# Patient Record
Sex: Male | Born: 1950 | Race: White | Hispanic: No | Marital: Married | Smoking: Current every day smoker
Health system: Southern US, Community
[De-identification: ages and names within clinical notes are randomized; demographics above are authoritative.]

## PROBLEM LIST (undated history)

## (undated) DIAGNOSIS — I679 Cerebrovascular disease, unspecified: Secondary | ICD-10-CM

## (undated) DIAGNOSIS — E119 Type 2 diabetes mellitus without complications: Secondary | ICD-10-CM

## (undated) DIAGNOSIS — K589 Irritable bowel syndrome without diarrhea: Secondary | ICD-10-CM

## (undated) DIAGNOSIS — I4819 Other persistent atrial fibrillation: Secondary | ICD-10-CM

## (undated) DIAGNOSIS — M1990 Unspecified osteoarthritis, unspecified site: Secondary | ICD-10-CM

## (undated) DIAGNOSIS — Z9889 Other specified postprocedural states: Secondary | ICD-10-CM

## (undated) DIAGNOSIS — M48061 Spinal stenosis, lumbar region without neurogenic claudication: Secondary | ICD-10-CM

## (undated) DIAGNOSIS — I2510 Atherosclerotic heart disease of native coronary artery without angina pectoris: Secondary | ICD-10-CM

## (undated) DIAGNOSIS — E785 Hyperlipidemia, unspecified: Secondary | ICD-10-CM

## (undated) DIAGNOSIS — Z720 Tobacco use: Secondary | ICD-10-CM

## (undated) DIAGNOSIS — I739 Peripheral vascular disease, unspecified: Secondary | ICD-10-CM

## (undated) DIAGNOSIS — R7303 Prediabetes: Secondary | ICD-10-CM

## (undated) DIAGNOSIS — I252 Old myocardial infarction: Secondary | ICD-10-CM

## (undated) DIAGNOSIS — I10 Essential (primary) hypertension: Secondary | ICD-10-CM

## (undated) DIAGNOSIS — Z87442 Personal history of urinary calculi: Secondary | ICD-10-CM

## (undated) DIAGNOSIS — I499 Cardiac arrhythmia, unspecified: Secondary | ICD-10-CM

## (undated) DIAGNOSIS — R112 Nausea with vomiting, unspecified: Secondary | ICD-10-CM

## (undated) DIAGNOSIS — I639 Cerebral infarction, unspecified: Secondary | ICD-10-CM

## (undated) HISTORY — PX: CHOLECYSTECTOMY: SHX55

## (undated) HISTORY — PX: CARDIAC CATHETERIZATION: SHX172

## (undated) HISTORY — PX: TONSILLECTOMY: SUR1361

## (undated) HISTORY — DX: Irritable bowel syndrome, unspecified: K58.9

## (undated) HISTORY — DX: Tobacco use: Z72.0

## (undated) HISTORY — PX: ANTERIOR CRUCIATE LIGAMENT REPAIR: SHX115

## (undated) HISTORY — DX: Old myocardial infarction: I25.2

## (undated) HISTORY — DX: Cerebral infarction, unspecified: I63.9

## (undated) HISTORY — PX: KNEE SURGERY: SHX244

## (undated) HISTORY — DX: Hyperlipidemia, unspecified: E78.5

## (undated) HISTORY — DX: Other persistent atrial fibrillation: I48.19

## (undated) HISTORY — DX: Essential (primary) hypertension: I10

## (undated) HISTORY — DX: Atherosclerotic heart disease of native coronary artery without angina pectoris: I25.10

## (undated) HISTORY — DX: Cerebrovascular disease, unspecified: I67.9

## (undated) HISTORY — DX: Peripheral vascular disease, unspecified: I73.9

## (undated) MED FILL — Dexamethasone Sodium Phosphate Inj 100 MG/10ML: INTRAMUSCULAR | Qty: 1 | Status: AC

## (undated) MED FILL — Fosaprepitant Dimeglumine For IV Infusion 150 MG (Base Eq): INTRAVENOUS | Qty: 5 | Status: AC

---

## 1994-09-24 DIAGNOSIS — I252 Old myocardial infarction: Secondary | ICD-10-CM

## 1994-09-24 HISTORY — DX: Old myocardial infarction: I25.2

## 1999-03-01 ENCOUNTER — Ambulatory Visit (HOSPITAL_COMMUNITY): Admission: RE | Admit: 1999-03-01 | Discharge: 1999-03-01 | Payer: Self-pay | Admitting: Family Medicine

## 1999-03-01 ENCOUNTER — Encounter: Payer: Self-pay | Admitting: Family Medicine

## 2000-01-24 ENCOUNTER — Ambulatory Visit (HOSPITAL_COMMUNITY): Admission: RE | Admit: 2000-01-24 | Discharge: 2000-01-24 | Payer: Self-pay | Admitting: Cardiovascular Disease

## 2002-05-20 ENCOUNTER — Encounter: Payer: Self-pay | Admitting: Otolaryngology

## 2002-05-20 ENCOUNTER — Encounter: Admission: RE | Admit: 2002-05-20 | Discharge: 2002-05-20 | Payer: Self-pay | Admitting: Otolaryngology

## 2004-02-23 ENCOUNTER — Ambulatory Visit (HOSPITAL_COMMUNITY): Admission: RE | Admit: 2004-02-23 | Discharge: 2004-02-23 | Payer: Self-pay | Admitting: Family Medicine

## 2006-07-24 ENCOUNTER — Inpatient Hospital Stay (HOSPITAL_BASED_OUTPATIENT_CLINIC_OR_DEPARTMENT_OTHER): Admission: RE | Admit: 2006-07-24 | Discharge: 2006-07-24 | Payer: Self-pay | Admitting: Cardiovascular Disease

## 2006-07-26 ENCOUNTER — Ambulatory Visit (HOSPITAL_COMMUNITY): Admission: RE | Admit: 2006-07-26 | Discharge: 2006-07-27 | Payer: Self-pay | Admitting: Cardiovascular Disease

## 2006-08-25 ENCOUNTER — Encounter: Payer: Self-pay | Admitting: Cardiovascular Disease

## 2007-03-05 ENCOUNTER — Emergency Department (HOSPITAL_COMMUNITY): Admission: EM | Admit: 2007-03-05 | Discharge: 2007-03-05 | Payer: Self-pay | Admitting: Emergency Medicine

## 2010-08-11 ENCOUNTER — Encounter: Admission: RE | Admit: 2010-08-11 | Discharge: 2010-08-11 | Payer: Self-pay | Admitting: Cardiovascular Disease

## 2010-08-11 ENCOUNTER — Encounter: Payer: Self-pay | Admitting: Cardiovascular Disease

## 2010-08-11 ENCOUNTER — Ambulatory Visit: Payer: Self-pay | Admitting: Cardiology

## 2010-08-14 ENCOUNTER — Telehealth (INDEPENDENT_AMBULATORY_CARE_PROVIDER_SITE_OTHER): Payer: Self-pay | Admitting: Radiology

## 2010-08-15 ENCOUNTER — Encounter (HOSPITAL_COMMUNITY)
Admission: RE | Admit: 2010-08-15 | Discharge: 2010-10-24 | Payer: Self-pay | Source: Home / Self Care | Attending: Cardiovascular Disease | Admitting: Cardiovascular Disease

## 2010-08-15 ENCOUNTER — Ambulatory Visit: Payer: Self-pay

## 2010-08-15 ENCOUNTER — Encounter: Payer: Self-pay | Admitting: Cardiology

## 2010-08-15 ENCOUNTER — Ambulatory Visit: Payer: Self-pay | Admitting: Cardiology

## 2010-08-15 ENCOUNTER — Ambulatory Visit: Payer: Self-pay | Admitting: Cardiovascular Disease

## 2010-08-15 DIAGNOSIS — I2510 Atherosclerotic heart disease of native coronary artery without angina pectoris: Secondary | ICD-10-CM

## 2010-09-01 ENCOUNTER — Ambulatory Visit: Payer: Self-pay | Admitting: Surgery

## 2010-10-24 NOTE — Assessment & Plan Note (Signed)
Summary: ADD ON ABN MYOVIEW   Visit Type:  DOD ADD-ON Primary Provider:  Leonides Sake  CC:  pt added on to schedule today from nuc study...sob while on treadmill during stress test...pt smokes about 1 1/4 ppd....denies any cp during stress stest.  History of Present Illness: 60 yo WM with history of CAD s/p DES RCA, HTN and hyperlipidemia who is added on to my schedule today as the DOD after positive stress test for evaluation.  He was seen by Norma Fredrickson in the [facility] office last week for complaints of fatigue  and facial flushing. He has had recent elevated blood pressures at home. He has been under much stress at home. A stress myoview was ordered and was performed today in our office. His stress test is read as low risk. His perfusion images showed mild  apical thinning. With exercise, he developed ST segment depression. He tells me that he has had no chest pain or significant change in his breathing. He felt that he did well on the treadmill today. His BP went up to 230 systolically with exercise today.  Blood pressures at home have been 180/100. No chest pain similar to the pain he was having prior to his stent being placed in 2007.   Current Medications (verified): 1)  Lisinopril-Hydrochlorothiazide 20-25 Mg Tabs (Lisinopril-Hydrochlorothiazide) .Marland Kitchen.. 1 Tab Once Daily 2)  Crestor 10 Mg Tabs (Rosuvastatin Calcium) .Marland Kitchen.. 1 Tab At Bedtime 3)  Plavix 75 Mg Tabs (Clopidogrel Bisulfate) .Marland Kitchen.. 1 Tab Once Daily 4)  Tricor 145 Mg Tabs (Fenofibrate) .Marland Kitchen.. 1 Tab Once Daily 5)  Meloxicam 7.5 Mg Tabs (Meloxicam) .Marland Kitchen.. 1 Tab Once Daily As Needed 6)  Vicodin 5-500 Mg Tabs (Hydrocodone-Acetaminophen) .Marland Kitchen.. 1 Tab Once Daily As Needed 7)  Nitrostat 0.4 Mg Subl (Nitroglycerin) .Marland Kitchen.. 1 Tablet Under Tongue At Onset of Chest Pain; You May Repeat Every 5 Minutes For Up To 3 Doses.  Allergies (verified): No Known Drug Allergies  Past History:  Past Medical History: CAD s/p DES RCA 11/07, PTCA 1996  Circumflex Hyperlipidemia HTN Tobacco abuse  Past Surgical History: ACL repair left knee 4 surgeries right knee Cholecystectomy  Family History: Mother-deceased, Alzheimers, age 59 Father-deceased, late 45s. CAD s/p CABG  Social History: Tobacco-1ppd for 40 years Social alcohol No illicit drug use Married, 2 children (25,21)  Review of Systems       The patient complains of fatigue.  The patient denies malaise, fever, weight gain/loss, vision loss, decreased hearing, hoarseness, chest pain, palpitations, shortness of breath, prolonged cough, wheezing, sleep apnea, coughing up blood, abdominal pain, blood in stool, nausea, vomiting, diarrhea, heartburn, incontinence, blood in urine, muscle weakness, joint pain, leg swelling, rash, skin lesions, headache, fainting, dizziness, depression, anxiety, enlarged lymph nodes, easy bruising or bleeding, and environmental allergies.    Vital Signs:  Patient profile:   60 year old male Height:      70 inches Weight:      196 pounds BMI:     28.22 Pulse rate:   56 / minute Pulse rhythm:   irregular BP sitting:   152 / 67  (left arm) Cuff size:   large  Vitals Entered By: Danielle Rankin, CMA (August 15, 2010 4:11 PM)  Physical Exam  General:  General: Well developed, well nourished, NAD HEENT: OP clear, mucus membranes moist SKIN: warm, dry Neuro: No focal deficits Musculoskeletal: Muscle strength 5/5 all ext Psychiatric: Mood and affect normal Neck: No JVD, no carotid bruits, no thyromegaly, no lymphadenopathy. Lungs:Clear bilaterally, no wheezes,  rhonci, crackles CV: RRR no murmurs, gallops rubs Abdomen: soft, NT, ND, BS present Extremities: No edema, pulses 2+.    Impression & Recommendations:  Problem # 1:  CAD, NATIVE VESSEL (ICD-414.01) Low risk stress test today. No exertional chest pain. He continues to smoke. I have asked him to stop smoking. I will add Norvasc 5 mg by mouth Qdaily for better BP control. He will  follow up with Dr. Elease Hashimoto in 2-3 weeks. I do not think a cath is indicated at this time.    His updated medication list for this problem includes:    Lisinopril-hydrochlorothiazide 20-25 Mg Tabs (Lisinopril-hydrochlorothiazide) .Marland Kitchen... 1 tab once daily    Plavix 75 Mg Tabs (Clopidogrel bisulfate) .Marland Kitchen... 1 tab once daily    Nitrostat 0.4 Mg Subl (Nitroglycerin) .Marland Kitchen... 1 tablet under tongue at onset of chest pain; you may repeat every 5 minutes for up to 3 doses.    Amlodipine Besylate 5 Mg Tabs (Amlodipine besylate) .Marland Kitchen... Take one tablet by mouth daily  Patient Instructions: 1)  Your physician recommends that you schedule a follow-up appointment with Dr. Elease Hashimoto in the next 1-2 weeks. 2)  Your physician has recommended you make the following change in your medication:  START Amlodipine 5mg  by mouth daily.  Prescriptions: AMLODIPINE BESYLATE 5 MG TABS (AMLODIPINE BESYLATE) Take one tablet by mouth daily  #30 x 6   Entered by:   Whitney Maeola Sarah RN   Authorized by:   Verne Carrow, MD   Signed by:   Ellender Hose RN on 08/15/2010   Method used:   Electronically to        [facility]. [phone]* (retail)       [address]       [place], [place]  [address]       Ph: [phone] or [phone]       Fax: [phone]   RxID:   [id number] NITROSTAT 0.4 MG SUBL (NITROGLYCERIN) 1 tablet under tongue at onset of chest pain; you may repeat every 5 minutes for up to 3 doses.  #25 x 6   Entered by:   Danielle Rankin, CMA   Authorized by:   Verne Carrow, MD   Signed by:   Danielle Rankin, CMA on 08/15/2010   Method used:   Electronically to        [facility] [phone]* (retail)       [address]       [place], [place]  [address]       Ph: [phone] or [phone]       Fax: [phone]   RxID:   [id number]

## 2010-10-24 NOTE — Progress Notes (Signed)
Summary: [facility]   Imported By: Earl Many 08/23/2010 16:25:38  _____________________________________________________________________  External Attachment:    Type:   Image     Comment:   External Document

## 2010-10-24 NOTE — Progress Notes (Signed)
Summary: Nuc Pre-Procedure  Phone Note Outgoing Call Call back at [facility] Phone [phone]   Call placed by: Leonia Corona, RT-N,  August 14, 2010 4:39 PM Reason for Call: Confirm/change Appt Summary of Call: Reviewed information on Myoview Information Sheet (see scanned document for further details).  Spoke with patient.     Nuclear Med Background Indications for Stress Test: Evaluation for Ischemia, Stent Patency   History: Heart Catheterization, Myocardial Infarction, Myocardial Perfusion Study  History Comments: H/O old MI 2007- [facility]PS- Inflat scar w/ peri-infarct ischemia. 2007- Cath w/ Stent-RCA. Residual totalled CFX.  Symptoms: Fatigue    Nuclear Pre-Procedure Cardiac Risk Factors: Family History - CAD, Lipids, Smoker

## 2010-10-24 NOTE — Assessment & Plan Note (Addendum)
Summary: Cardiology Nuclear Testing  Nuclear Med Background Indications for Stress Test: Evaluation for Ischemia, Stent Patency   History: Heart Catheterization, Myocardial Infarction, Myocardial Perfusion Study  History Comments: H/O old MI 2007- MPS- Inflat scar w/ peri-infarct ischemia. 2007- Cath w/ Stent-RCA. Residual totalled CFX.  Symptoms: DOE, Fatigue, Palpitations, SOB    Nuclear Pre-Procedure Cardiac Risk Factors: Family History - CAD, Lipids, Smoker Caffeine/Decaff Intake: none NPO After: 8:30 PM Lungs: clear IV 0.9% NS with Angio Cath: 22g     IV Site: R Hand IV Started by: Cathlyn Parsons, RN Chest Size (in) 70     Height (in): 70 Weight (lb): 196 BMI: 28.22 Tech Comments: This pateint walked on the treadmill and became very sob. As soon as he went into recovery he had his BP drop from 212/86 to 122/84.  In recovery he also had extreme ST,T changes. DOD C.McAlhany saw this patient for further evaluation.  Nuclear Med Study 1 or 2 day study:  1 day     Stress Test Type:  Stress Reading MD:  Marca Ancona, MD     Referring MD:  P.[name] Resting Radionuclide:  Technetium 27m Tetrofosmin     Resting Radionuclide Dose:  11 mCi  Stress Radionuclide:  Technetium 26m Tetrofosmin     Stress Radionuclide Dose:  33 mCi   Stress Protocol Exercise Time (min):  8:00 min     Max HR:  142 bpm     Predicted Max HR:  163 bpm  Max Systolic BP: 231 mm Hg     Percent Max HR:  87.12 %     METS: 10.10 Rate Pressure Product:  16109    Stress Test Technologist:  Milana Na, EMT-P     Nuclear Technologist:  Domenic Polite, CNMT  Rest Procedure  Myocardial perfusion imaging was performed at rest 45 minutes following the intravenous administration of Technetium 39m Tetrofosmin.  Stress Procedure  The patient exercised for 8:00. The patient stopped due to sob, fatigue and denied any chest pain.  There were + significant ST-T wave changes and occ pvcs, pacs, and Vcuplets.   Technetium 31m Tetrofosmin was injected at peak exercise and myocardial perfusion imaging was performed after a brief delay.  QPS Raw Data Images:  Normal; no motion artifact; normal heart/lung ratio. Stress Images:  Small apical perfusion defect, basal inferolateral perfusion defect.  Rest Images:  Small apical perfusion defect, basal inferolateral perfusion defect.  Subtraction (SDS):  Small fixed apical defect and fixed basal inferolateral defect.  Transient Ischemic Dilatation:  1.08  (Normal <1.22)  Lung/Heart Ratio:  .36  (Normal <0.45)  Quantitative Gated Spect Images QGS EDV:  207 ml QGS ESV:  112 ml QGS EF:  46 % QGS cine images:  Basal lateral hypokinesis   Overall Impression  Exercise Capacity: Good exercise capacity. BP Response: Hypertensive blood pressure response. Clinical Symptoms: Short of breath, no chest pain.  ECG Impression: 1-2 mm horizontal to downsloping ST depression in inferior leads and V4-V5.  Overall Impression: Apical thinning and possible infarction involving the basal inferolateral wall.  Overall Impression Comments: Perfusion images do not suggest ischemia but there were ischemic ECG changes.  EF 46% with inferolateral hypokinesis.  This study appears to be quite similar to the prior myoview in 10/07.

## 2010-10-24 NOTE — Cardiovascular Report (Signed)
Summary: Cardiac Cath  Cardiac Cath   Imported By: Earl Many 08/23/2010 16:27:23  _____________________________________________________________________  External Attachment:    Type:   Image     Comment:   External Document

## 2011-02-06 NOTE — Consult Note (Signed)
NAMENICHOLAUS, STEINKE NO.:  [id number]   MEDICAL RECORD NO.:  [id number]          PATIENT TYPE:  EMS   LOCATION:  MAJO                         FACILITY:  [facility]   PHYSICIAN:  Vesta Mixer, M.D. DATE OF BIRTH:  1951-06-28   DATE OF CONSULTATION:  03/05/2007  DATE OF DISCHARGE:                                 CONSULTATION   Scott [name] is a 60 year old gentleman with a history of coronary  artery disease.  He presents to the emergency room with abdominal pain.   [name] has a history of coronary artery disease.  He is status post  inferolateral myocardial infarction years ago due to occlusion of the  circumflex artery.  We were able to open up the circumflex artery on one  occasion approximately 10 years ago, but later he presented and it was  found to be closed.   He presented with angina this past November.  This was exertional angina  that occurred every time he walked.  At that time, he was found to have  a 90% right coronary artery.  This was treated with PTCA and stenting.  He has done quite well since that time.  His cholesterol levels have  been fairly well-controlled.  Unfortunately, he has continued to smoke.  Last night, he ate some [place] food.  He stayed up late and was working on  a project.  He developed a little bit of indigestion-like pain and a gas-  like pain.  He did not feel very well but managed to get to bed around 2  o'clock.  This morning, he woke up and took some Levsin and Aciphex.  About 30 minutes later, he took a nitroglycerin, and then approximately  15-30 minutes later the pain was finally relieved.  He was brought to  the ER by his family, but the pain was completely relieved at that time.   He states that the pain is in his upper abdomen.  There was some  radiation up into his chest.  He felt like if he could pass gas that his  pain would be a lot better.  He states that these pains are not at all  similar to his previous  episodes of angina-like pain.   CURRENT MEDICATIONS:  1. __________ hydrochlorothiazide at 20 mg/25 mg a day.  2. Celebrex 200 mg a day.  3. Crestor 10 mg a day.  4. Plavix 75 mg a day.  5. Tricor 145 mg a day.  6. Proscar 5 mg tablets, one-third tablet a day.  7. Chantix (The patient is scheduled to restart).   ALLERGIES:  None.   PAST MEDICAL HISTORY:  1. Coronary artery disease.  He is status post PTCA of his left      circumflex artery many years ago.  He is status post PTCA and      stenting of his right coronary artery in November.  2. History of dyslipidemia.   SOCIAL HISTORY:  The patient continues to smoke about a pack of  cigarettes a day.   FAMILY HISTORY:  Positive for cardiac disease.   REVIEW OF  SYSTEMS:  Reviewed and is essentially negative.  He denies any  heat or cold intolerance.  He denies any weight gain or weight loss.  He  denies any syncope or presyncope.  He denies any PND or orthopnea.  His  review of systems is otherwise negative.   PHYSICAL EXAMINATION:  GENERAL:  He is a middle-aged gentleman in no  acute distress.  He is alert and oriented x3, and his mood and affect  are normal.  VITAL SIGNS:  His heart rate is 44.  His blood pressure 123/64.  His  heart rate is noted to be in the low 50s frequently during his office  visits.  HEENT:  2+ carotids.  He has no bruits, no JVD, no thyromegaly.  LUNGS:  Clear to auscultation.  HEART:  Regular rate.  S1-S2.  ABDOMEN:  Good bowel sounds and is nontender.  EXTREMITIES:  He has no clubbing, cyanosis or edema.  NEUROLOGIC:  Nonfocal.   ELECTROCARDIOGRAM:  His EKG reveals sinus bradycardia.  He has no ST or  T-wave changes.   LABORATORY DATA:  Cardiac enzymes are negative.  His white blood cell  count is normal.  Hemoglobin is 14.2.  His electrolytes are normal.   IMPRESSION AND PLAN:  1. Abdominal pain/chest pain.  I suspect that this is noncardiac      abdominal pain and chest pain.  His enzymes  are negative despite      having symptoms for the past 10 hours or so.  In addition, he      states that these symptoms are clearly different than his previous      episodes of angina.  We will discharge him home from the emergency      room today.  We will have him call me tonight to check in.  I have      told him to certainly call or come back to the hospital if he has      any recurrent episodes of chest pain and that we may need to      perform a heart catheterization.  Will see him in the office in      several days.           ______________________________  Vesta Mixer, M.D.     PJN/MEDQ  D:  03/05/2007  T:  03/05/2007  Job:  [id number]   cc:   Holley Bouche, M.D.

## 2011-02-09 NOTE — H&P (Signed)
NAMEMAXTON, NOREEN              ACCOUNT NO.:  [id number]   MEDICAL RECORD NO.:  [id number]            PATIENT TYPE:   LOCATION:                                 FACILITY:   PHYSICIAN:  Vesta Mixer, M.D. DATE OF BIRTH:  1951/09/14   DATE OF ADMISSION:  DATE OF DISCHARGE:                                HISTORY & PHYSICAL   Scott [name] is a middle-aged gentleman with a history of coronary artery  disease.  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  The circumflex region fills the collateral vessels.  He presented recently  with some episodes of chest discomfort.  He had a stress test which revealed  deep ST-segment depression with the T-wave inversions, as well as some mild  chest discomfort.  Surprisingly, the Cardiolite images did not reveal any  significant abnormality.  Because of the profound changes on the electrical  component of the test, we have decided to proceed with heart  catheterization.   Scott [name] has been having some episodes of chest discomfort for the past several  weeks.  He has noted several times when he has been out walking that he had  to stop and have his wife go bring the car for him.  He has not taken any  nitroglycerin.   He has continued to smoke.  He denies any syncope or pre-syncope.  He denies  any PND or orthopnea.   CURRENT MEDICATIONS:  1. Ecotrin once a day.  2. Zocor 20 mg a day.  3. Lisinopril HCT 20 mg/25 mg a day.  4. Celebrex 200 mg a day.   ALLERGIES:  None.   PAST MEDICAL HISTORY:  1. History of coronary artery disease - status post chronic total      occlusion of his left circumflex artery.  2. Hypertension.   SOCIAL HISTORY:  The patient smokes one pack a day.  He does not drink  alcohol to excess.   FAMILY HISTORY:  Is noncontributory.  His mother has a history of  bradycardia.   SOCIAL HISTORY:  The patient works from home.   REVIEW OF SYSTEMS:  Unremarkable, except as noted in the HPI.   PHYSICAL EXAMINATION:  GENERAL:  On exam, he is a middle-aged gentleman in  no acute distress.  He is alert and oriented x3.  His mood and affect are  normal.  His weight is 196, his blood pressure is 152/70 with heart rate of  74.  HEENT:  Exam reveals 2+ carotids.  He has no bruits, no JVD, no thyromegaly.  LUNGS:  Clear to auscultation.  HEART:  Regular rate S1-S2 no murmurs.  ABDOMEN:  Exam reveals good bowel sounds and is nontender.  EXTREMITIES:  Has no clubbing, cyanosis or edema.  NEURO:  Exam is nonfocal.   His EKG reveals normal sinus rhythm.  He has with Q-waves in lead aVL.  He  has no acute changes.   Scott [name] presents with an abnormal stress test.  He has known coronary artery  disease.  I have recommended that we proceed with heart catheterization.  We  have discussed the risks, benefits and options of heart catheterization.  He  understands and agrees to proceed.           ______________________________  Vesta Mixer, M.D.     PJN/MEDQ  D:  07/21/2006  T:  07/22/2006  Job:  [id number]   cc:   Holley Bouche, M.D.

## 2011-02-09 NOTE — Cardiovascular Report (Signed)
[name], [name]              ACCOUNT NO.:  [id number]   MEDICAL RECORD NO.:  [id number]          PATIENT TYPE:  OIB   LOCATION:  6529                         FACILITY:  [facility]   PHYSICIAN:  Vesta Mixer, M.D. DATE OF BIRTH:  07-03-1951   DATE OF PROCEDURE:  07/26/2006  DATE OF DISCHARGE:                              CARDIAC CATHETERIZATION   HISTORY:  Scott [name] is a 60 year old gentleman with a history of  coronary artery disease.  He is status post inferolateral wall myocardial  infarction.  He has a known occlusion of the left circum left circumflex  artery.  He recently developed symptoms of chest pain and a diagnostic heart  catheterization revealed a tight 95% stenosis in the distal right coronary  artery.  He was also noted to have diffuse irregularities in the mid RCA of  between 30 and 50%.   PROCEDURE:  Percutaneous coronary intervention and stent placement to the  distal right coronary artery.   DESCRIPTION OF PROCEDURE:  The right femoral artery was easily cannulated  using a modified Seldinger technique.  4500 units of heparin were given  followed by a double bolus Integrilin drip.  The ACT was 314.  The patient  had previously been loaded on Plavix for the past three days.   The right coronary artery was easily cannulated with a 7-French Judkins  right four side hole guide.  The [employer]ahi soft wire was placed down into the  distal right coronary artery.  A 2.75 x 15 mm Firestart balloon was placed  down across the stenosis.  It was inflated up to 8 atmospheres for 32  seconds for predilatation.  This resulted in some improvement of the vessel  lumen.  At this point, a 3 x 18 mm Cypher drug-eluting stent was positioned  across the stenosis.  We also had to cross the posterior descending artery.  It was deployed at 14 atmospheres for 48 seconds.  Post stent dilatation was  achieved using a 3 x 15 mm Durastar balloon.  We positioned this balloon  distally in the  stent was inflated it up to 14 atmospheres for 20 seconds.  It was then pulled back to the middle of the stent and inflated up to 16  atmospheres for 25 seconds.  We then pulled it back in the proximal edge of  the stent and inflated it up to 15 atmospheres for 20 seconds.  This  resulted in a very nice angiographic result.  There was a 0% residual  stenosis.  There was great flow down the posterior descending artery.   COMPLICATIONS:  None.   CONCLUSION:  Successful percutaneous transluminal coronary angiography and  stenting of the distal right coronary artery.  He will need to remain on  Plavix for the next couple of years.  We have also advised him many times to  quit smoking.           ______________________________  Vesta Mixer, M.D.     PJN/MEDQ  D:  07/26/2006  T:  07/26/2006  Job:  [id number]   cc:   Holley Bouche, M.D.

## 2011-02-09 NOTE — Discharge Summary (Signed)
Scott [name], Scott [name]              ACCOUNT NO.:  [id number]   MEDICAL RECORD NO.:  [id number]          PATIENT TYPE:  OIB   LOCATION:  6529                         FACILITY:  [facility]   PHYSICIAN:  Vesta Mixer, M.D. DATE OF BIRTH:  1951/04/18   DATE OF ADMISSION:  07/26/2006  DATE OF DISCHARGE:  07/27/2006                                 DISCHARGE SUMMARY   DISCHARGE DIAGNOSIS:  1. Coronary artery disease - status post percutaneous transluminal      coronary angiography and stenting of the distal right coronary artery.  2. Dyslipidemia with a low HDL and high triglyceride level.  3. History of cigarette smoking.  4. Hypertension.   DISCHARGE MEDICATIONS:  Enteric-coated aspirin 81 mg a day, Plavix 75 mg a  day, Chantix as directed, nitroglycerin 0.4 mg sublingually as needed,  Crestor 10 mg a day, lisinopril HCT 20 mg/25 mg once a day, Tricor 145 mg a  day.   DISPOSITION:  The patient will see Dr. Elease Hashimoto in 1-2 weeks.  He is to call  for appointment.   HISTORY:  Mr. Specht is a 60 year old gentleman who was recently found to  have symptoms of angina.  Outpatient heart catheterization revealed a tight  stenosis in the distal right coronary artery.  Please see dictated H&P for  further details.   HOSPITAL COURSE BY PROBLEMS:  1. Coronary artery disease.  The patient underwent successful PTCA and      stenting of his distal right coronary artery using a 3-mm Cypher stent.      He tolerated the procedure quite well.  He will be discharged on the      above noted medications.  He has been instructed to take Plavix for at      least 2-3 years.  He will call sooner if he has any problems.   1. Hyperlipidemia.  The patient had a fasting lipid profile.  He has been      on Zocor.  His total cholesterol is 166, his triglyceride level is 298,      HDL 30, LDL 76, cholesterol to HDL ratio is 5.5.  He was changed to      Crestor 10 mg a day.  We will add Tricor 145 mg a day.  We have     instructed him to quit smoking which should also help raise his HDL.      We will follow-up with a fasting lipid profile in the office.  All of      his other medical problems are stable.           ______________________________  Vesta Mixer, M.D.     PJN/MEDQ  D:  07/27/2006  T:  07/27/2006  Job:  [id number]   cc:   Holley Bouche, M.D.

## 2011-02-09 NOTE — Cardiovascular Report (Signed)
NAMEOSWIN, GRIFFITH              ACCOUNT NO.:  [id number]   MEDICAL RECORD NO.:  [id number]          PATIENT TYPE:  OIB   LOCATION:  1965                         FACILITY:  [facility]   PHYSICIAN:  Vesta Mixer, M.D. DATE OF BIRTH:  02-10-1951   DATE OF PROCEDURE:  07/24/2006  DATE OF DISCHARGE:                              CARDIAC CATHETERIZATION   Scott [name] is a 60 year old gentleman with a history of known coronary  artery disease.  He has a long history of cigarette smoking.  He recently  developed some symptoms of angina.  He recently had a stress Cardiolite  study which revealed deep ST-segment depressions and T-wave inversions in  the inferolateral leads.  The Cardiolite revealed an inferolateral scar with  a very small amount of peri-infarct ischemia.  He is referred for heart  catheterization because of these abnormalities.   The right femoral artery was easily cannulated using the modified Seldinger  technique.   HEMODYNAMIC RESULTS:  LV pressure is 131/8 with an aortic pressure of  127/56.   ANGIOGRAPHY:  Left main:  The left main is smooth and normal.   The left anterior descending artery has minor luminal irregularities.  There  is 4 small to moderate-sized diagonal branches, all of which have only minor  luminal irregularities.  There is no significant stenosis.   The left circumflex artery is a moderate size branch and is occluded  proximally.  The distal obtuse marginal artery can be seen filling via left-  to-left collaterals.   The right coronary artery is large and dominant.  There are mild  irregularities in the mid vessel between 20 and 30%.  There is a distal 90%  ulcerated plaque.  The posterior descending artery and posterolateral  segment artery had only minor luminal irregularities.   Left ventriculogram was performed in the 30 RAO position.  It reveals normal  left ventricular systolic function.  Ejection fraction is approximately 60%.  There  is mild mitral regurgitation.   COMPLICATIONS:  None.   CONCLUSION:  Two-vessel coronary artery disease.  He has a known occlusion  of his left circumflex artery.  He has a tight stenosis in his right  coronary artery.  We will set him up for PCI of his distal right coronary  artery.           ______________________________  Vesta Mixer, M.D.     PJN/MEDQ  D:  07/24/2006  T:  07/24/2006  Job:  [id number]   cc:   Holley Bouche, M.D.

## 2011-02-09 NOTE — Cardiovascular Report (Signed)
[facility]  Patient:    Scott [name], Scott [name]                     MRN: [id number] Proc. Date: 01/24/00 Adm. Date:  [id number] Disc. Date: [id number] Attending:  Koren Bound                        Cardiac Catheterization  PROCEDURES PERFORMED: 1. Left heart catheterization. 2. Selective coronary angiography. 3. Left ventriculogram.  HISTORY:  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  He is status post PTCA of his left circumflex artery.  He returns with symptoms of chest pain with exertion.  The patient has continued to smoke.  DESCRIPTION OF PROCEDURE:  The right femoral artery was easily cannulated using a modified Seldinger technique.  HEMODYNAMICS: 1. Left ventricular pressure:  140/17. 2. Aortic pressure:  139/73.  ANGIOGRAPHY: 1. LEFT MAIN:  Smooth and normal. 2. LEFT ANTERIOR DESCENDING:  Has only minor luminal irregularities, but is    otherwise smooth and unremarkable.  There is approximately 10-20% stenosis    in the proximal LAD.  The LAD gives off several moderate-sized diagonal    branches, with minor luminal irregularities but with no critical    stenoses.  The left anterior descending artery reaches around and    supplies the anteroapical wall. 3. LEFT CIRCUMFLEX ARTERY:  A moderate-sized vessel.  It gives off a small    obtuse marginal artery and is then occluded.  The left circumflex artery    fills very slowly via left-to-left collaterals.  This occlusion is at    the site of the previous PTCA done four years ago. 4. RIGHT CORONARY ARTERY:  A large and dominant vessel.  It has minor luminal    irregularities in the mid segment, between 20-30%.  The posterior    descending artery and a large posterolateral segment are very unremarkable.  LEFT VENTRICULOGRAM:  Performed in the 30-degree RAO position.  It reveals a normal left ventricular systolic  function with an ejection fraction of 60-65%. There was no mitral regurgitation.  COMPLICATIONS:  None.  CONCLUSIONS: 1. Coronary artery disease involving an occluded distal left circumflex. 2. Overall well-preserved left ventricular systolic function.  PLAN:  We will continue with medical therapy. DD:  02/15/00 TD:  02/18/00 Job: [id number]

## 2011-02-20 ENCOUNTER — Encounter (HOSPITAL_COMMUNITY): Payer: Self-pay

## 2011-02-22 ENCOUNTER — Ambulatory Visit (HOSPITAL_COMMUNITY)
Admission: RE | Admit: 2011-02-22 | Discharge: 2011-02-22 | Disposition: A | Discharge status: Home / Self Care | Payer: 59 | Source: Ambulatory Visit | Attending: Family Medicine | Admitting: Family Medicine

## 2011-02-22 DIAGNOSIS — R062 Wheezing: Secondary | ICD-10-CM | POA: Insufficient documentation

## 2011-02-22 DIAGNOSIS — F17200 Nicotine dependence, unspecified, uncomplicated: Secondary | ICD-10-CM | POA: Insufficient documentation

## 2011-02-22 DIAGNOSIS — J988 Other specified respiratory disorders: Secondary | ICD-10-CM | POA: Insufficient documentation

## 2011-02-22 DIAGNOSIS — R059 Cough, unspecified: Secondary | ICD-10-CM | POA: Insufficient documentation

## 2011-02-22 DIAGNOSIS — R05 Cough: Secondary | ICD-10-CM | POA: Insufficient documentation

## 2011-02-22 LAB — PULMONARY FUNCTION TEST

## 2011-07-12 LAB — DIFFERENTIAL
Basophils Relative: 0
Eosinophils Relative: 2
Lymphocytes Relative: 33
Monocytes Absolute: 0.7
Monocytes Relative: 11
Neutro Abs: 3.5

## 2011-07-12 LAB — I-STAT 8, (EC8 V) (CONVERTED LAB)
Acid-base deficit: 2
Bicarbonate: 22.9
HCT: 43
Hemoglobin: 14.6
Operator id: 285841
Sodium: 139
pCO2, Ven: 38.4 — ABNORMAL LOW

## 2011-07-12 LAB — POCT I-STAT CREATININE: Creatinine, Ser: 1

## 2011-07-12 LAB — POCT CARDIAC MARKERS
CKMB, poc: 1
Troponin i, poc: <0.05

## 2011-07-12 LAB — CBC
HCT: 41.5
Hemoglobin: 14.2
MCHC: 34.2
RBC: 4.72
RDW: 12.9

## 2013-04-13 ENCOUNTER — Encounter: Payer: Self-pay | Admitting: Cardiovascular Disease

## 2013-04-16 ENCOUNTER — Encounter: Payer: Self-pay | Admitting: Cardiovascular Disease

## 2015-04-07 ENCOUNTER — Telehealth: Payer: Self-pay

## 2015-04-07 NOTE — Telephone Encounter (Signed)
Received cardiac clearance request from [facility] regarding holding pt's Plavix before colonoscopy on 04/10/15 w/ Dr. Penelope Coop. Per Christell Faith, PA, "We cannot comment.  Have not seen pt since 08/15/2010." Faxed to [phone].

## 2015-04-12 ENCOUNTER — Telehealth: Payer: Self-pay

## 2015-04-12 NOTE — Telephone Encounter (Signed)
S/w front desk receptionist. Left message for Marita Kansas, CMA stating unable to advise regarding Plavix since pt has not been seen since 2011. Faxed again to [phone]

## 2015-04-13 ENCOUNTER — Encounter: Payer: Self-pay | Admitting: Cardiovascular Disease

## 2015-04-13 ENCOUNTER — Ambulatory Visit (INDEPENDENT_AMBULATORY_CARE_PROVIDER_SITE_OTHER): Payer: 59 | Admitting: Cardiovascular Disease

## 2015-04-13 VITALS — BP 146/102 | HR 70 | Ht 69.0 in | Wt 199.8 lb

## 2015-04-13 DIAGNOSIS — R0602 Shortness of breath: Secondary | ICD-10-CM | POA: Diagnosis not present

## 2015-04-13 DIAGNOSIS — I4891 Unspecified atrial fibrillation: Secondary | ICD-10-CM | POA: Diagnosis not present

## 2015-04-13 DIAGNOSIS — Z01818 Encounter for other preprocedural examination: Secondary | ICD-10-CM

## 2015-04-13 MED ORDER — RIVAROXABAN 20 MG PO TABS: 20.0000 mg | ORAL_TABLET | Freq: Every day | ORAL | Status: DC

## 2015-04-13 NOTE — Progress Notes (Signed)
Cardiology Office Note   Date:  04/13/2015   ID:  Scott [name], Scott [name] 12/06/1950, MRN [id number]  PCP:  Shirline Frees, MD  Cardiologist:   Thayer Headings, MD   Chief Complaint  Patient presents with  . other    No complaints. Pt needs cardiac clearance for colonoscopy. Meds reviewed verbally with pt.   Problem List 1. CAD - s/p Inf. MI 2. Anxiety 3. Hyperlipidemia 4. Essential hypertension    History of Present Illness: Scott [name] is a 64 y.o. male who presents for pre-op clearance prior to a colonoscopy.  He is on plavix and the GI team wanted permission to stop.  Feels fine.  No CP. Not breathing was well.  Since last fall, his breathing has not been as good.  Has had lots of bronchial type issues.   Has taked multiple rounds of antibiotics.   Has not been exercising .  Retired last year.    Still smoking .   Has been trying to cut back  Denies any palpitations.     Past Medical History  Diagnosis Date  . CAD (coronary artery disease)   . Tobacco abuse   . HLD (hyperlipidemia)   . HTN (hypertension)   . IBS (irritable bowel syndrome)   . MI, old     Past Surgical History  Procedure Laterality Date  . Cardiac catheterization      CONE  . Knee surgery    . Cholecystectomy    . Anterior cruciate ligament repair       Current Outpatient Prescriptions  Medication Sig Dispense Refill  . amLODipine (NORVASC) 10 MG tablet Take 10 mg by mouth daily.     Marland Kitchen atorvastatin (LIPITOR) 20 MG tablet Take 20 mg by mouth daily.     . clopidogrel (PLAVIX) 75 MG tablet Take 75 mg by mouth daily.    . fenofibrate (TRICOR) 145 MG tablet Take 145 mg by mouth daily.    Marland Kitchen HYDROcodone-acetaminophen (NORCO/VICODIN) 5-325 MG per tablet Take 1 tablet by mouth daily as needed.     Marland Kitchen losartan-hydrochlorothiazide (HYZAAR) 100-25 MG per tablet Take 1 tablet by mouth daily.     Marland Kitchen VIAGRA 100 MG tablet Take 50 mg by mouth as needed.      No current facility-administered  medications for this visit.    Allergies:   Review of patient's allergies indicates no known allergies.    Social History:  The patient  reports that he has been smoking Cigarettes.  He has smoked for the past 43 years. He does not have any smokeless tobacco history on file. He reports that he drinks alcohol. He reports that he does not use illicit drugs.   Family History:  [name] family history includes Heart disease in his father.    ROS:  Please see the history of present illness.    Review of Systems: Constitutional:  denies fever, chills, diaphoresis, appetite change and fatigue.  HEENT: denies photophobia, eye pain, redness, hearing loss, ear pain, congestion, sore throat, rhinorrhea, sneezing, neck pain, neck stiffness and tinnitus.  Respiratory: denies SOB, DOE, cough, chest tightness, and wheezing.  Cardiovascular: denies chest pain, palpitations and leg swelling.  Gastrointestinal: denies nausea, vomiting, abdominal pain, diarrhea, constipation, blood in stool.  Genitourinary: denies dysuria, urgency, frequency, hematuria, flank pain and difficulty urinating.  Musculoskeletal: denies  myalgias, back pain, joint swelling, arthralgias and gait problem.   Skin: denies pallor, rash and wound.  Neurological: denies dizziness, seizures, syncope, weakness,  light-headedness, numbness and headaches.   Hematological: denies adenopathy, easy bruising, personal or family bleeding history.  Psychiatric/ Behavioral: denies suicidal ideation, mood changes, confusion, nervousness, sleep disturbance and agitation.       All other systems are reviewed and negative.    PHYSICAL EXAM: VS:  BP 146/102 mmHg  Pulse 70  Ht 5\' 9"  (1.753 m)  Wt 90.606 kg (199 lb 12 oz)  BMI 29.48 kg/m2 , BMI Body mass index is 29.48 kg/(m^2). GEN: Well nourished, well developed, in no acute distress HEENT: normal Neck: no JVD, carotid bruits, or masses Cardiac: irreg. Irreg. ; no murmurs, rubs, or  gallops,no edema  Respiratory:  clear to auscultation bilaterally, normal work of breathing GI: soft, nontender, nondistended, + BS MS: no deformity or atrophy Skin: warm and dry, no rash Neuro:  Strength and sensation are intact Psych: normal   EKG:  EKG is ordered today. The ekg ordered today demonstrates Atrial fib with a ventricular rate of 70 .  The atrial fib is new.    Recent Labs: No results found for requested labs within last 365 days.    Lipid Panel No results found for: CHOL, TRIG, HDL, CHOLHDL, VLDL, LDLCALC, LDLDIRECT    Wt Readings from Last 3 Encounters:  04/13/15 90.606 kg (199 lb 12 oz)  08/15/10 88.905 kg (196 lb)  08/15/10 88.905 kg (196 lb)      Other studies Reviewed: Additional studies/ records that were reviewed today include: . Review of the above records demonstrates:    ASSESSMENT AND PLAN:  1.  Atrial fib:  This is new for [name].  His CHADS2 VASC score is 2 ( CAD, HTN) . At this time we will discontinue his Plavix. He is scheduled for colonoscopy in one week so we will not start Xarelto yet. He is to start his Xarelto 20 mg a day or so following the colonoscopy assuming that he has no post procedure bleeding.  Will get an echo to assess his LV function  His rate is well controlled.  In fact, the rate is normal which explains  He does not need any additional HR slowing meds.    2. CAD : He is not having any angina. Continue current medications. I once again tried to persuade him to stop smoking.  3. Essential hypertension:   Blood pressures well-controlled.  4. Hyperlipidemia:  Followed by his general medical doctor.  Current medicines are reviewed at length with the patient today.  The patient does not have concerns regarding medicines.  The following changes have been made:  no change  Labs/ tests ordered today include:  Orders Placed This Encounter  Procedures  . EKG 12-Lead     Disposition:   FU with me in 6 months.  Sooner if  needed.     [name], Wonda Cheng, MD  04/13/2015 3:15 PM    [facility] [place], [place], [place]  [address] Phone: [phone]; Fax: [phone]   [facility]  [address] [place], [place]  [address] [phone]   Fax [phone]

## 2015-04-13 NOTE — Patient Instructions (Addendum)
Medication Instructions:  Your physician has recommended you make the following change in your medication:  STOP taking Plavix START taking Xarelto 20mg  once per day after your colonoscopy   Labwork: none  Testing/Procedures: Your physician has requested that you have an echocardiogram in [place]. Echocardiography is a painless test that uses sound waves to create images of your heart. It provides your doctor with information about the size and shape of your heart and how well your heart's chambers and valves are working. This procedure takes approximately one hour. There are no restrictions for this procedure.    Follow-Up: Your physician wants you to follow-up in: six months with Dr. Acie Fredrickson in [place].  You will receive a reminder letter in the mail two months in advance. If you don't receive a letter, please call our office to schedule the follow-up appointment.   Any Other Special Instructions Will Be Listed Below (If Applicable).

## 2015-04-14 ENCOUNTER — Telehealth: Payer: Self-pay | Admitting: Cardiovascular Disease

## 2015-04-15 ENCOUNTER — Other Ambulatory Visit (HOSPITAL_COMMUNITY): Payer: 59

## 2015-04-18 ENCOUNTER — Other Ambulatory Visit: Payer: Self-pay

## 2015-04-18 ENCOUNTER — Ambulatory Visit (HOSPITAL_COMMUNITY): Payer: 59 | Attending: Cardiovascular Disease

## 2015-04-18 DIAGNOSIS — F17200 Nicotine dependence, unspecified, uncomplicated: Secondary | ICD-10-CM | POA: Diagnosis not present

## 2015-04-18 DIAGNOSIS — E785 Hyperlipidemia, unspecified: Secondary | ICD-10-CM | POA: Diagnosis not present

## 2015-04-18 DIAGNOSIS — I4891 Unspecified atrial fibrillation: Secondary | ICD-10-CM | POA: Diagnosis not present

## 2015-04-18 DIAGNOSIS — I10 Essential (primary) hypertension: Secondary | ICD-10-CM | POA: Insufficient documentation

## 2015-04-18 DIAGNOSIS — I517 Cardiomegaly: Secondary | ICD-10-CM | POA: Insufficient documentation

## 2015-04-20 ENCOUNTER — Telehealth: Payer: Self-pay | Admitting: Nurse Practitioner

## 2015-04-20 DIAGNOSIS — I4891 Unspecified atrial fibrillation: Secondary | ICD-10-CM

## 2015-04-20 MED ORDER — APIXABAN 5 MG PO TABS: 5.0000 mg | ORAL_TABLET | Freq: Two times a day (BID) | ORAL | Status: DC

## 2015-04-20 NOTE — Telephone Encounter (Signed)
Spoke with patient to review echo results.  Patient verbalized understanding. Patient came into the [facility] office on Monday 7/25 and requested information regarding cost of NOACs, specifically Eliquis and Xarelto.  I wrote down drug names and doses and advised him to call his pharmacy, [facility], to determine pricing for these 2 agents.  Patient reports today that Eliquis will be significantly cheaper for him and requests to d/c Xarelto Rx and send Eliquis Rx to [facility].  I spoke with Dr. Acie Fredrickson, who is working in the hospital today and he is in agreement with this plan.  I advised patient that I am placing Eliquis samples at the front desk for him to pick up.  He had colonoscopy today and reports that no polyps were removed.  I advised him he may start Eliquis on Friday.  I scheduled him for 1 month appointment with [facility] and for bmet and CBC.  Patient verbalized understanding and agreement and will call back with questions or concerns prior to that time.

## 2015-04-26 ENCOUNTER — Telehealth: Payer: Self-pay | Admitting: Cardiovascular Disease

## 2015-04-26 MED ORDER — APIXABAN 5 MG PO TABS
5.0000 mg | ORAL_TABLET | Freq: Two times a day (BID) | ORAL | Status: DC
Start: 2015-04-26 — End: 2015-12-26

## 2015-04-26 NOTE — Telephone Encounter (Signed)
New message     Pt has question regarding eliquis; needing to see if he could be put on something less expensive Pt also has questions about echo and wants to speak to Dr Acie Fredrickson about the echo Please call to discuss

## 2015-04-26 NOTE — Telephone Encounter (Signed)
I can talk to him at his next office visit about the echo if he would like

## 2015-04-26 NOTE — Telephone Encounter (Signed)
Patient had some questions about eliquis. Patient wants to receive 90 day supply instead of 30 day supply due to cost. Changed prescription to 90 day supply and 3 refills. Also patient was given a number for eliquis to see if he qualifies for any co-pay card or free 30 day trial. Patient stated he would like to talk to Dr. Acie Fredrickson about his echo. Patient is aware of his results, but wants to discuss them with the doctor. Will forward to Dr. Acie Fredrickson.

## 2015-04-28 NOTE — Telephone Encounter (Signed)
Dr. Acie Fredrickson left message for patient on 8/3

## 2015-05-25 ENCOUNTER — Ambulatory Visit (INDEPENDENT_AMBULATORY_CARE_PROVIDER_SITE_OTHER): Payer: 59 | Admitting: Pharmacist

## 2015-05-25 DIAGNOSIS — I4891 Unspecified atrial fibrillation: Secondary | ICD-10-CM

## 2015-05-25 LAB — CBC WITH DIFFERENTIAL/PLATELET
Basophils Absolute: 0 10*3/uL (ref 0.0–0.1)
Basophils Relative: 0.4 % (ref 0.0–3.0)
EOS PCT: 1.8 % (ref 0.0–5.0)
Eosinophils Absolute: 0.1 10*3/uL (ref 0.0–0.7)
HCT: 44.7 % (ref 39.0–52.0)
Hemoglobin: 15.1 g/dL (ref 13.0–17.0)
LYMPHS ABS: 1.9 10*3/uL (ref 0.7–4.0)
Lymphocytes Relative: 25.2 % (ref 12.0–46.0)
MCHC: 33.9 g/dL (ref 30.0–36.0)
MCV: 86.8 fl (ref 78.0–100.0)
MONOS PCT: 8 % (ref 3.0–12.0)
Monocytes Absolute: 0.6 10*3/uL (ref 0.1–1.0)
NEUTROS PCT: 64.6 % (ref 43.0–77.0)
Neutro Abs: 4.8 10*3/uL (ref 1.4–7.7)
Platelets: 322 10*3/uL (ref 150.0–400.0)
RBC: 5.15 Mil/uL (ref 4.22–5.81)
RDW: 13.8 % (ref 11.5–15.5)
WBC: 7.5 10*3/uL (ref 4.0–10.5)

## 2015-05-25 LAB — BASIC METABOLIC PANEL
BUN: 19 mg/dL (ref 6–23)
CHLORIDE: 106 meq/L (ref 96–112)
CO2: 27 meq/L (ref 19–32)
Calcium: 9.1 mg/dL (ref 8.4–10.5)
Creatinine, Ser: 1.15 mg/dL (ref 0.40–1.50)
GFR: 68.49 mL/min (ref >60.00)
GLUCOSE: 192 mg/dL — AB (ref 70–99)
POTASSIUM: 3 meq/L — AB (ref 3.5–5.1)
SODIUM: 141 meq/L (ref 135–145)

## 2015-05-25 NOTE — Progress Notes (Signed)
Pt was started on Eliquis 5mg  BID for afib on 04/26/15.  Reviewed patients medication list.  Pt is not currently on any combined P-gp and strong CYP3A4 inhibitors/inducers (ketoconazole, traconazole, ritonavir, carbamazepine, phenytoin, rifampin, St. John's wort).  Reviewed labs.  SCr 1.15, Weight 86.6 kg, CrCl 83.  Dose appropriate based on wt >60kg, age <80, and SCr <1.5.   Hgb and HCT both stable and WNL at 15.1 and 44.7  A full discussion of the nature of anticoagulants has been carried out.  A benefit/risk analysis has been presented to the patient, so that they understand the justification for choosing anticoagulation with Eliquis at this time.  The need for compliance is stressed.  Pt is aware to take the medication twice daily.  Side effects of potential bleeding are discussed, including unusual colored urine or stools, coughing up blood or coffee ground emesis, nose bleeds or serious fall or head trauma.  Discussed signs and symptoms of stroke. The patient should avoid any OTC items containing aspirin or ibuprofen.  Avoid alcohol consumption.   Call if any signs of abnormal bleeding.  Discussed financial obligations and resolved any difficulty in obtaining medication.  Contacted patient to let him know of lab results - he verbalized understanding. Next lab test in 6 months.

## 2015-05-31 ENCOUNTER — Telehealth: Payer: Self-pay | Admitting: Cardiovascular Disease

## 2015-05-31 DIAGNOSIS — E876 Hypokalemia: Secondary | ICD-10-CM

## 2015-05-31 MED ORDER — POTASSIUM CHLORIDE ER 10 MEQ PO TBCR: 10.0000 meq | EXTENDED_RELEASE_TABLET | Freq: Every day | ORAL | Status: DC

## 2015-05-31 NOTE — Telephone Encounter (Signed)
Reviewed lab results and plan of care with patient who verbalized understanding and agreement.  He is scheduled for repeat bmet on 10/7.

## 2015-05-31 NOTE — Telephone Encounter (Signed)
New message ° ° ° °Pt returning call about results.  °

## 2015-07-01 ENCOUNTER — Other Ambulatory Visit (INDEPENDENT_AMBULATORY_CARE_PROVIDER_SITE_OTHER): Payer: 59 | Admitting: *Deleted

## 2015-07-01 DIAGNOSIS — E876 Hypokalemia: Secondary | ICD-10-CM | POA: Diagnosis not present

## 2015-07-01 LAB — BASIC METABOLIC PANEL
BUN: 20 mg/dL (ref 7–25)
CALCIUM: 8.8 mg/dL (ref 8.6–10.3)
CHLORIDE: 106 mmol/L (ref 98–110)
CO2: 23 mmol/L (ref 20–31)
Creat: 1.03 mg/dL (ref 0.70–1.25)
GLUCOSE: 126 mg/dL — AB (ref 65–99)
Potassium: 3.5 mmol/L (ref 3.5–5.3)
SODIUM: 139 mmol/L (ref 135–146)

## 2015-07-01 NOTE — Addendum Note (Signed)
Addended by: Eulis Foster on: 07/01/2015 10:01 AM   Modules accepted: Orders

## 2015-07-01 NOTE — Addendum Note (Signed)
Addended by: Eulis Foster on: 07/01/2015 10:02 AM   Modules accepted: Orders

## 2015-10-24 ENCOUNTER — Ambulatory Visit (INDEPENDENT_AMBULATORY_CARE_PROVIDER_SITE_OTHER): Payer: 59 | Admitting: *Deleted

## 2015-10-24 DIAGNOSIS — I4891 Unspecified atrial fibrillation: Secondary | ICD-10-CM

## 2015-10-24 LAB — CBC
HCT: 45.7 % (ref 39.0–52.0)
Hemoglobin: 15.6 g/dL (ref 13.0–17.0)
MCH: 29.3 pg (ref 26.0–34.0)
MCHC: 34.1 g/dL (ref 30.0–36.0)
MCV: 85.7 fL (ref 78.0–100.0)
MPV: 9.5 fL (ref 8.6–12.4)
PLATELETS: 304 10*3/uL (ref 150–400)
RBC: 5.33 MIL/uL (ref 4.22–5.81)
RDW: 13.6 % (ref 11.5–15.5)
WBC: 8.1 10*3/uL (ref 4.0–10.5)

## 2015-10-24 LAB — BASIC METABOLIC PANEL
BUN: 11 mg/dL (ref 7–25)
CALCIUM: 9.1 mg/dL (ref 8.6–10.3)
CO2: 25 mmol/L (ref 20–31)
CREATININE: 0.97 mg/dL (ref 0.70–1.25)
Chloride: 107 mmol/L (ref 98–110)
GLUCOSE: 131 mg/dL — AB (ref 65–99)
Potassium: 3.7 mmol/L (ref 3.5–5.3)
Sodium: 141 mmol/L (ref 135–146)

## 2015-10-24 NOTE — Progress Notes (Signed)
Pt was started on Eliquis 5mg  BID for Afib on 04/26/2015.    Reviewed patients medication list.  Pt is not  currently on any combined P-gp and strong CYP3A4 inhibitors/inducers (ketoconazole, traconazole, ritonavir, carbamazepine, phenytoin, rifampin, St. John's wort).  Reviewed labs.  SCr 0.97, Weight 89 Kg, Age 65 yrs old.  Dose appropriate based on specified criteria.   Hgb and HCT 15.6/45.7.   A full discussion of the nature of anticoagulants has been carried out.  A benefit/risk analysis has been presented to the patient, so that they understand the justification for choosing anticoagulation with Eliquis at this time.  The need for compliance is stressed.  Pt is aware to take the medication twice daily.  Side effects of potential bleeding are discussed, including unusual colored urine or stools, coughing up blood or coffee ground emesis, nose bleeds or serious fall or head trauma.  Discussed signs and symptoms of stroke. The patient should avoid any OTC items containing aspirin or ibuprofen.  Avoid alcohol consumption.   Call if any signs of abnormal bleeding.  Discussed financial obligations and resolved any difficulty in obtaining medication.  Follow with Cardiologist as scheduled.

## 2015-12-16 ENCOUNTER — Encounter: Payer: Self-pay | Admitting: Pulmonary Disease

## 2015-12-16 ENCOUNTER — Encounter (INDEPENDENT_AMBULATORY_CARE_PROVIDER_SITE_OTHER): Payer: Self-pay

## 2015-12-16 ENCOUNTER — Telehealth: Payer: Self-pay | Admitting: Pulmonary Disease

## 2015-12-16 ENCOUNTER — Ambulatory Visit (INDEPENDENT_AMBULATORY_CARE_PROVIDER_SITE_OTHER): Payer: 59 | Admitting: Pulmonary Disease

## 2015-12-16 DIAGNOSIS — F17200 Nicotine dependence, unspecified, uncomplicated: Secondary | ICD-10-CM

## 2015-12-16 DIAGNOSIS — R0609 Other forms of dyspnea: Secondary | ICD-10-CM

## 2015-12-16 DIAGNOSIS — I10 Essential (primary) hypertension: Secondary | ICD-10-CM | POA: Insufficient documentation

## 2015-12-16 DIAGNOSIS — R0600 Dyspnea, unspecified: Secondary | ICD-10-CM

## 2015-12-16 DIAGNOSIS — M1990 Unspecified osteoarthritis, unspecified site: Secondary | ICD-10-CM | POA: Insufficient documentation

## 2015-12-16 DIAGNOSIS — I4891 Unspecified atrial fibrillation: Secondary | ICD-10-CM | POA: Insufficient documentation

## 2015-12-16 DIAGNOSIS — K589 Irritable bowel syndrome without diarrhea: Secondary | ICD-10-CM | POA: Insufficient documentation

## 2015-12-16 DIAGNOSIS — E785 Hyperlipidemia, unspecified: Secondary | ICD-10-CM | POA: Insufficient documentation

## 2015-12-16 NOTE — Telephone Encounter (Signed)
IMAGING  CARDIAC  LABS 10/24/15

## 2015-12-16 NOTE — Patient Instructions (Signed)
   We will do a breathing and walking test on or before your next appointment  Please call or email me if you have any new problems or questions with regards to your breathing  TESTS ORDERED: 1. Full pulmonary function testing on or before next appointment 2. 6 minute walk test on room air on or before next appointment

## 2015-12-16 NOTE — Telephone Encounter (Signed)
IMAGING CXR PA/LAT 08/11/10 (personally reviewed by me): No pleural effusion or thickening. Heart normal in size. Mediastinum normal in contour. Mild hyperinflation with deep sulci bilaterally. No parenchymal mass or opacity appreciated.  CARDIAC TTE (04/18/15): LV normal in size and wall thickness. Moderate LVH. EF 55-60%. Indeterminate diastolic function with atrial fibrillation. No regional wall motion abnormalities. LA moderately dilated & RA mildly dilated. RV normal in size and function. No aortic stenosis or regurgitation. Ascending aorta normal in size. Trivial mitral regurgitation without stenosis. No pulmonic regurgitation. No significant tricuspid regurgitation. No pericardial effusion.  LABS 10/24/15 CBC: 8.1/15.6/45.7/304 BMP: 141/3.7/107/25/11/0.97/131/9.1

## 2015-12-16 NOTE — Progress Notes (Signed)
Subjective:    Patient ID: Scott [name], male    DOB: 03/05/1951, 65 y.o.   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 He reports in 2012 he was having some mild dyspnea. He reports that in November 2015 he had repetitive illnesses treated with antibiotics and breathing problems. He reports since December 2015 his dyspnea has progressively worsened. He reports he works in his yard regularly and notices that he has had more fatigue. Since then he has been diagnosed with atrial fibrillation. He reports he does try to walk regularly (2 miles) but does find he still lacks motivation due to fatigue. He reports his dyspnea is only with exertion primarily but does have some mild dyspnea at rest rarely. He does cough intermittently, mostly in the morning. He reports his cough produces a "brown" mucus. Denies any wheezing. He was previously on Symbicort during an episode of chest congestion without a significant improvement in his breathing. Denies any chest tightness, pressure, or palpitations. No nausea, emesis, abdominal pain, reflux, or morning brash water taste in the morning. He has had more of a dry mouth lately. No rashes or bruising. He does have joint pain in his knees. He does have some stiffness in his hands, especially in the morning. He reports this lasts all day long. No history of childhood bronchitis, asthma, or allergies.   Review of Systems No dysuria or hematuria. No adenopathy in his neck, groin, or axilla. A pertinent 14 point review of systems is negative except as per the history of presenting illness.  No Known Allergies  Current Outpatient Prescriptions on File Prior to Visit  Medication Sig Dispense Refill  . amLODipine (NORVASC) 10 MG tablet Take 10 mg by mouth daily.     Marland Kitchen apixaban (ELIQUIS) 5 MG TABS tablet Take 1 tablet (5 mg total) by mouth 2 (two) times daily. 180 tablet 3  . atorvastatin  (LIPITOR) 20 MG tablet Take 20 mg by mouth daily.     . fenofibrate (TRICOR) 145 MG tablet Take 145 mg by mouth daily.    Marland Kitchen HYDROcodone-acetaminophen (NORCO/VICODIN) 5-325 MG per tablet Take 1 tablet by mouth daily as needed.     Marland Kitchen losartan-hydrochlorothiazide (HYZAAR) 100-25 MG per tablet Take 1 tablet by mouth daily.     . potassium chloride (K-DUR) 10 MEQ tablet Take 1 tablet (10 mEq total) by mouth daily. 31 tablet 11  . VIAGRA 100 MG tablet Take 50 mg by mouth as needed.      No current facility-administered medications on file prior to visit.    Past Medical History  Diagnosis Date  . CAD (coronary artery disease)   . Tobacco abuse   . HLD (hyperlipidemia)   . HTN (hypertension)   . IBS (irritable bowel syndrome)   . MI, old   . Atrial fibrillation [facility])     Past Surgical History  Procedure Laterality Date  . Cardiac catheterization      CONE  . Knee surgery  1982, 1988, 1997, 2000    x 4 times.  . Cholecystectomy    . Anterior cruciate ligament repair    . Tonsillectomy      Family History  Problem Relation Age of Onset  . Heart disease Father   . Melanoma Father   . Testicular cancer Father   . Hypertension Mother   . Arthritis Mother   . Arthritis Sister   . Lung disease Neg  Hx     Social History   Social History  . Marital Status: Married    Spouse Name: N/A  . Number of Children: 2  . Years of Education: N/A   Occupational History  . retired    Social History Main Topics  . Smoking status: Current Every Day Smoker -- 1.00 packs/day for 43 years    Types: Cigarettes    Start date: 06/02/1971  . Smokeless tobacco: None  . Alcohol Use: 0.0 oz/week    0 Standard drinks or equivalent per week     Comment: occasionaly  . Drug Use: No  . Sexual Activity: Not Asked   Other Topics Concern  . None   Social History Narrative   He is originally from [place]. He moved to [facility] in [place]. Previously has traveled to most of the [place]. Has traveled to [place].  Has been to [place], [place], & [place]. 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 Remote bird exposure in 1980. No mold exposure.       Objective:   Physical Exam BP 142/82 mmHg  Pulse 85  Ht 5\' 10"  (1.778 m)  Wt 186 lb 9.6 oz (84.641 kg)  BMI 26.77 kg/m2  SpO2 99% General:  Awake. Alert. No acute distress.  Integument:  Warm & dry. No rash on exposed skin. No bruising. Lymphatics:  No appreciated cervical or supraclavicular lymphadenoapthy. HEENT:  Moist mucus membranes. No oral ulcers. No scleral injection or icterus.  Cardiovascular:  Regular rate. No edema. No appreciable JVD.  Pulmonary:  Good aeration & clear to auscultation bilaterally. Symmetric chest wall expansion. No accessory muscle use. Abdomen: Soft. Normal bowel sounds. Nondistended. Grossly nontender. Musculoskeletal:  Normal bulk and tone. Hand grip strength 4+/5 bilaterally. No joint deformity or effusion appreciated. Mild synovial thickening of bilateral DIP & PIP joints. Neurological:  CN 2-12 grossly in tact. No meningismus. Moving all 4 extremities equally. Symmetric brachioradialis deep tendon reflexes. Psychiatric:  Mood and affect congruent. Speech normal rhythm, rate & tone.   PFT 02/22/11: FVC 4.59 L (94%) FEV1 3.57 L (97%) FEV1/FVC 0.78 FEF 25-75 3.23 L (104%) no bronchodilator response  IMAGING CXR PA/LAT 08/11/10 (personally reviewed by me): No pleural effusion or thickening. Heart normal in size. Mediastinum normal in contour. Mild hyperinflation with deep sulci bilaterally. No parenchymal mass or opacity appreciated.  CARDIAC TTE (04/18/15): LV normal in size and wall thickness. Moderate LVH. EF 55-60%. Indeterminate diastolic function with atrial fibrillation. No regional wall motion abnormalities. LA moderately dilated & RA mildly dilated. RV normal in size and function. No aortic stenosis or regurgitation. Ascending aorta normal in size. Trivial mitral  regurgitation without stenosis. No pulmonic regurgitation. No significant tricuspid regurgitation. No pericardial effusion.  LABS 10/24/15 CBC: 8.1/15.6/45.7/304 BMP: 141/3.7/107/25/11/0.97/131/9.1    Assessment & Plan:   65 year old male with known underlying atrial fibrillation. He does have ongoing tobacco use which could be contributing to his morning cough and certainly puts him at risk for COPD. I reviewed his prior spirometry from 2012 which shows no significant airway obstruction by given his symptoms have significantly declined over the last 2 years I feel repeat testing is reasonable. I instructed the patient to contact my office if he had any new breathing problems or questions before his next appointment.  1. Dyspnea: Checking full pulmonary function testing & 6 minute walk test on room air on her before next appointment. 2. Ongoing tobacco use/tobacco use disorder: Patient counseled for over 3  minutes on the need for complete tobacco cessation. Recommended nicotine replacement with gum and patches. 3. Health maintenance: Plan to address at next appointment. 4. Follow-up: Patient to return to clinic in 1-2 months or sooner if needed.  Sonia Baller Ashok Cordia, M.D. [facility] Pulmonary & Critical Care Pager:  [phone] After 3pm or if no response, call [phone] 11:25 AM 12/16/2015

## 2015-12-26 ENCOUNTER — Other Ambulatory Visit: Payer: Self-pay | Admitting: *Deleted

## 2015-12-26 MED ORDER — APIXABAN 5 MG PO TABS: 5.0000 mg | ORAL_TABLET | Freq: Two times a day (BID) | ORAL | Status: DC

## 2015-12-26 NOTE — Telephone Encounter (Signed)
Pt called in for a 30 day supply of Eliquis 5mg  to be sent in to [facility], so he can use his 30 day free card.

## 2016-01-12 ENCOUNTER — Other Ambulatory Visit: Payer: Self-pay | Admitting: *Deleted

## 2016-01-12 MED ORDER — APIXABAN 5 MG PO TABS: 5.0000 mg | ORAL_TABLET | Freq: Two times a day (BID) | ORAL | Status: DC

## 2016-01-12 NOTE — Telephone Encounter (Signed)
Pharmacy in [facility] requested #7 of eliquis as the patient is there and left some of his medication at home.

## 2016-01-19 ENCOUNTER — Ambulatory Visit: Payer: 59

## 2016-01-19 ENCOUNTER — Ambulatory Visit: Payer: 59 | Admitting: Pulmonary Disease

## 2016-01-19 ENCOUNTER — Inpatient Hospital Stay (HOSPITAL_COMMUNITY): Admission: RE | Admit: 2016-01-19 | Payer: 59 | Source: Ambulatory Visit

## 2016-01-30 ENCOUNTER — Other Ambulatory Visit: Payer: Self-pay | Admitting: *Deleted

## 2016-01-30 MED ORDER — APIXABAN 5 MG PO TABS: 5.0000 mg | ORAL_TABLET | Freq: Two times a day (BID) | ORAL | Status: DC

## 2016-01-31 ENCOUNTER — Ambulatory Visit (INDEPENDENT_AMBULATORY_CARE_PROVIDER_SITE_OTHER): Payer: 59 | Admitting: Pulmonary Disease

## 2016-01-31 ENCOUNTER — Ambulatory Visit (HOSPITAL_COMMUNITY)
Admission: RE | Admit: 2016-01-31 | Discharge: 2016-01-31 | Disposition: A | Discharge status: Home / Self Care | Payer: 59 | Source: Ambulatory Visit | Attending: Pulmonary Disease | Admitting: Pulmonary Disease

## 2016-01-31 ENCOUNTER — Encounter: Payer: Self-pay | Admitting: Pulmonary Disease

## 2016-01-31 VITALS — BP 150/78 | HR 60 | Ht 70.0 in | Wt 190.2 lb

## 2016-01-31 DIAGNOSIS — R0600 Dyspnea, unspecified: Secondary | ICD-10-CM | POA: Diagnosis present

## 2016-01-31 DIAGNOSIS — R0609 Other forms of dyspnea: Secondary | ICD-10-CM | POA: Diagnosis not present

## 2016-01-31 DIAGNOSIS — F17200 Nicotine dependence, unspecified, uncomplicated: Secondary | ICD-10-CM

## 2016-01-31 LAB — PULMONARY FUNCTION TEST
DL/VA % PRED: 84 %
DL/VA: 3.89 ml/min/mmHg/L
DLCO UNC % PRED: 81 %
DLCO UNC: 26.26 ml/min/mmHg
FEF 25-75 POST: 3.19 L/s
FEF 25-75 Pre: 2.7 L/sec
FEF2575-%CHANGE-POST: 18 %
FEF2575-%PRED-POST: 112 %
FEF2575-%Pred-Pre: 94 %
FEV1-%CHANGE-POST: 7 %
FEV1-%PRED-PRE: 86 %
FEV1-%Pred-Post: 92 %
FEV1-Post: 3.25 L
FEV1-Pre: 3.04 L
FEV1FVC-%CHANGE-POST: 4 %
FEV1FVC-%PRED-PRE: 100 %
FEV6-%Change-Post: 4 %
FEV6-%PRED-POST: 92 %
FEV6-%Pred-Pre: 88 %
FEV6-PRE: 3.95 L
FEV6-Post: 4.12 L
FEV6FVC-%CHANGE-POST: 0 %
FEV6FVC-%PRED-PRE: 104 %
FEV6FVC-%Pred-Post: 104 %
FVC-%Change-Post: 2 %
FVC-%PRED-POST: 88 %
FVC-%PRED-PRE: 86 %
FVC-PRE: 4.03 L
FVC-Post: 4.13 L
POST FEV1/FVC RATIO: 79 %
PRE FEV6/FVC RATIO: 99 %
Post FEV6/FVC ratio: 100 %
Pre FEV1/FVC ratio: 75 %
RV % PRED: 92 %
RV: 2.13 L
TLC % pred: 94 %
TLC: 6.62 L

## 2016-01-31 MED ORDER — ALBUTEROL SULFATE (2.5 MG/3ML) 0.083% IN NEBU
2.5000 mg | INHALATION_SOLUTION | Freq: Once | RESPIRATORY_TRACT | Status: AC
  Administered 2016-01-31: 2.5 mg via RESPIRATORY_TRACT

## 2016-01-31 NOTE — Progress Notes (Signed)
6MWT 01/31/16:  Walked 558 meters / Baseline Sat 96% on RA / Nadir Sat 96% on RA

## 2016-01-31 NOTE — Patient Instructions (Signed)
   Continue working on cutting back totally on your tobacco use.  Call me if you have any new breathing problems or questions before your next appointment.  Please call me and let me know if you wish to proceed with the cardiopulmonary exercise testing we discussed today after you speak with your cardiologist.  I will see back in 3 months or sooner if needed.

## 2016-01-31 NOTE — Progress Notes (Signed)
Subjective:    Patient ID: Scott [name], male    DOB: [birth year], 65 y.o.   MRN: [id number]  C.C.:  Follow-up for Dyspnea/Cough & Ongoing Tobacco Use.  HPI Dyspnea/Cough:  He is continuing to walk regularly for 2 miles. He continues to have dyspnea on exertion that he feels it out of proportion. He reports infrequent coughing. No significant wheezing. Reports mor dyspnea with doing yard work.  Ongoing Tobacco Use:  He hasn't yet started using nicotine replacement. Continuing to smoke tobacco.  Review of Systems Denies any chest pain, pressure, or palpitations. Does note more fatigue. No fever, chills, or sweats. No nausea, emesis, or diarrhea.   No Known Allergies  Current Outpatient Prescriptions on File Prior to Visit  Medication Sig Dispense Refill  . amLODipine (NORVASC) 10 MG tablet Take 10 mg by mouth daily.     Marland Kitchen apixaban (ELIQUIS) 5 MG TABS tablet Take 1 tablet (5 mg total) by mouth 2 (two) times daily. 30 tablet 2  . atorvastatin (LIPITOR) 20 MG tablet Take 20 mg by mouth daily.     . fenofibrate (TRICOR) 145 MG tablet Take 145 mg by mouth daily.    Marland Kitchen HYDROcodone-acetaminophen (NORCO/VICODIN) 5-325 MG per tablet Take 1 tablet by mouth daily as needed.     Marland Kitchen losartan-hydrochlorothiazide (HYZAAR) 100-25 MG per tablet Take 1 tablet by mouth daily.     . potassium chloride (K-DUR) 10 MEQ tablet Take 1 tablet (10 mEq total) by mouth daily. 31 tablet 11  . VIAGRA 100 MG tablet Take 50 mg by mouth as needed.      No current facility-administered medications on file prior to visit.    Past Medical History  Diagnosis Date  . CAD (coronary artery disease)   . Tobacco abuse   . HLD (hyperlipidemia)   . HTN (hypertension)   . IBS (irritable bowel syndrome)   . MI, old   . Atrial fibrillation [facility])     Past Surgical History  Procedure Laterality Date  . Cardiac catheterization      CONE  . Knee surgery  1982, 1988, 1997, 2000    x 4 times.  . Cholecystectomy    .  Anterior cruciate ligament repair    . Tonsillectomy      Family History  Problem Relation Age of Onset  . Heart disease Father   . Melanoma Father   . Testicular cancer Father   . Hypertension Mother   . Arthritis Mother   . Arthritis Sister   . Lung disease Neg Hx     Social History   Social History  . Marital Status: Married    Spouse Name: N/A  . Number of Children: 2  . Years of Education: N/A   Occupational History  . retired    Social History Main Topics  . Smoking status: Current Every Day Smoker -- 1.00 packs/day for 43 years    Types: Cigarettes    Start date: 06/02/1971  . Smokeless tobacco: None  . Alcohol Use: 0.0 oz/week    0 Standard drinks or equivalent per week     Comment: occasionaly  . Drug Use: No  . Sexual Activity: Not Asked   Other Topics Concern  . None   Social History Narrative   He is originally from [place]. He moved to [facility] in [place]. Previously has traveled to most of the [place]. Has traveled to [place]. Has been to [place], [place], & [place]. 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 Has a dog currently. Remote bird exposure in 1980. No mold exposure.       Objective:   Physical Exam BP 150/78 mmHg  Pulse 60  Ht 5\' 10"  (1.778 m)  Wt 190 lb 3.2 oz (86.274 kg)  BMI 27.29 kg/m2  SpO2 100% General:  Awake. Alert. No distress.  Integument:  Warm & dry. No rash on exposed skin.  Lymphatics:  No appreciated cervical or supraclavicular lymphadenoapthy. HEENT:  Moist mucus membranes. No oral ulcers. No scleral injection.  Cardiovascular:  Regular rate. No edema. No appreciable JVD.  Pulmonary:  Clear bilaterally to auscultation. Normal work of breathing on room air. Speaking in complete sentences. Musculoskeletal:  Normal bulk and tone. No joint deformity or effusion appreciated.  PFT  01/31/16: FVC 4.03 L (86%) FEV1 3.04 L (86%) FEV1/FVC 0.75 FEF 25-75 2.70 L (94%) no bronchodilator response TLC 6.16 L (94%) RV 92% ERV  35% DLCO uncorrected 81% (Hgb 14.6) 02/22/11: FVC 4.59 L (94%) FEV1 3.57 L (97%) FEV1/FVC 0.78 FEF 25-75 3.23 L (104%) no bronchodilator response  6MWT 01/31/16: Walked 558 meters / Baseline Sat 96% on RA / Nadir Sat 96% on RA (HR 91-59)  IMAGING CXR PA/LAT 08/11/10 (previously reviewed by me): No pleural effusion or thickening. Heart normal in size. Mediastinum normal in contour. Mild hyperinflation with deep sulci bilaterally. No parenchymal mass or opacity appreciated.  CARDIAC TTE (04/18/15): LV normal in size and wall thickness. Moderate LVH. EF 55-60%. Indeterminate diastolic function with atrial fibrillation. No regional wall motion abnormalities. LA moderately dilated & RA mildly dilated. RV normal in size and function. No aortic stenosis or regurgitation. Ascending aorta normal in size. Trivial mitral regurgitation without stenosis. No pulmonic regurgitation. No significant tricuspid regurgitation. No pericardial effusion.  LABS 10/24/15 CBC: 8.1/15.6/45.7/304 BMP: 141/3.7/107/25/11/0.97/131/9.1    Assessment & Plan:   65 year old male with known underlying atrial fibrillation. Dyspnea on exertion is continuing to be an issue for the patient. I reviewed his pulmonary function testing as well as 6 minute walk test from today. His heart rate did not significantly elevated during or after his walk. He did not have a significant desaturation either. His pulmonary function testing is completely normal excluding the possibility of COPD contributing to his symptoms. However, I did review the fact that his spirometry has declined significantly since 2012 and more so than I would expect with normal physiologic rate of decline. As such I strongly urged the patient to consider discontinuing tobacco use completely to prevent further worsening of his lung function. Certainly his dyspnea may be multifactorial from his underlying atrial fibrillation, possible mild depression, declining lung function, etc.  We did discuss cardiopulmonary exercise testing with the patient wishes to discuss this further with his cardiologist before proceeding. I instructed him to contact my office if he had any further questions or concerns.    1. Dyspnea: holding on cardiopulmonary exercise testing until patient discusses this with his cardiologist. 2. Ongoing tobacco use/tobacco use disorder: Patient counseled for over 3 minutes and need for complete tobacco cessation. I again recommended nicotine replacement as a means to quit. 3. Follow-up: Patient to return to clinic in 3 months or sooner if needed.  Sonia Baller Ashok Cordia, M.D. [facility] Pulmonary & Critical Care Pager:  [phone] After 3pm or if no response, call [phone] 3:47 PM 01/31/2016

## 2016-02-07 ENCOUNTER — Ambulatory Visit: Payer: 59

## 2016-04-03 ENCOUNTER — Other Ambulatory Visit: Payer: Self-pay | Admitting: *Deleted

## 2016-04-03 MED ORDER — APIXABAN 5 MG PO TABS: 5.0000 mg | ORAL_TABLET | Freq: Two times a day (BID) | ORAL | Status: DC

## 2016-04-27 ENCOUNTER — Telehealth: Payer: Self-pay | Admitting: Pulmonary Disease

## 2016-04-30 NOTE — Telephone Encounter (Signed)
No documentation in phone note.  Will close note

## 2016-05-02 ENCOUNTER — Ambulatory Visit: Payer: 59 | Admitting: Pulmonary Disease

## 2016-05-23 ENCOUNTER — Telehealth: Payer: Self-pay | Admitting: Cardiovascular Disease

## 2016-05-23 NOTE — Telephone Encounter (Signed)
CALLED NEEDING ATLEAST 6 TABS OF ELIQUIS TO GET HIM HOME FROM [place], CALLED [facility] AND VERBALIZED [place] PHONE. PT AWARE

## 2016-05-23 NOTE — Telephone Encounter (Signed)
New Message   *STAT* If patient is at the pharmacy, call can be transferred to refill team.   1. Which medications need to be refilled? (please list name of each medication and dose if known) Eliquis 5 mg tablet twice daily  2. Which pharmacy/location (including street and city if local pharmacy) is medication [facility] sent to[facility] [place]  3. Do they need a 30 day or 90 day supply? Pt voiced he needs about 10 pills while out of town.

## 2016-05-25 ENCOUNTER — Ambulatory Visit: Payer: 59 | Admitting: Cardiovascular Disease

## 2016-06-01 NOTE — Telephone Encounter (Signed)
Please close Encounter °

## 2016-06-05 ENCOUNTER — Other Ambulatory Visit: Payer: Self-pay | Admitting: Cardiovascular Disease

## 2016-06-08 ENCOUNTER — Ambulatory Visit (INDEPENDENT_AMBULATORY_CARE_PROVIDER_SITE_OTHER): Payer: 59 | Admitting: Cardiovascular Disease

## 2016-06-08 ENCOUNTER — Encounter: Payer: Self-pay | Admitting: Cardiovascular Disease

## 2016-06-08 VITALS — BP 130/80 | HR 72 | Ht 70.0 in | Wt 182.2 lb

## 2016-06-08 DIAGNOSIS — I2510 Atherosclerotic heart disease of native coronary artery without angina pectoris: Secondary | ICD-10-CM

## 2016-06-08 DIAGNOSIS — I4891 Unspecified atrial fibrillation: Secondary | ICD-10-CM | POA: Diagnosis not present

## 2016-06-08 DIAGNOSIS — R9431 Abnormal electrocardiogram [ECG] [EKG]: Secondary | ICD-10-CM

## 2016-06-08 DIAGNOSIS — I481 Persistent atrial fibrillation: Secondary | ICD-10-CM | POA: Diagnosis not present

## 2016-06-08 DIAGNOSIS — I4819 Other persistent atrial fibrillation: Secondary | ICD-10-CM

## 2016-06-08 NOTE — Progress Notes (Addendum)
Cardiology Office Note   Date:  06/08/2016   ID:  Scott [name], DOB 1951/06/07, MRN [id number]  PCP:  Scott Frees, MD  Cardiologist:   Scott Moores, MD   Chief Complaint  Patient presents with  . Atrial Fibrillation   Problem List 1. CAD - s/p Inf. MI 2. Anxiety 3. Hyperlipidemia 4. Essential hypertension 5. Atrial fibrillation 6. Borderline Diabetes     Scott [name] is a 65 y.o. male who presents for pre-op clearance prior to a colonoscopy.  He is on plavix and the GI team wanted permission to stop.  Feels fine.  No CP. Not breathing was well.  Since last fall, his breathing has not been as good.  Has had lots of bronchial type issues.   Has taked multiple rounds of antibiotics.   Has not been exercising .  Retired last year.    Still smoking .   Has been trying to cut back  Denies any palpitations.    Sept. 15, 2017: Scott [name] is here to follow up for  atrial fib  Is on the Eliquis regularly  Has been diagnosed with borderline DM  Has been seen by Dr. Ashok [name] for shortness of breath .  Has had progressive shortness of breath . Has knee problems and shoulder issues Has been walking regularly  - walks 2 miles a day   Has not had any CP  No pains similar to his CP prior to stenting     Past Medical History:  Diagnosis Date  . Atrial fibrillation ([place])   . CAD (coronary artery disease)   . HLD (hyperlipidemia)   . HTN (hypertension)   . IBS (irritable bowel syndrome)   . MI, old   . Tobacco abuse     Past Surgical History:  Procedure Laterality Date  . ANTERIOR CRUCIATE LIGAMENT REPAIR    . CARDIAC CATHETERIZATION     CONE  . CHOLECYSTECTOMY    . [place], 2000   x 4 times.  . TONSILLECTOMY       Current Outpatient Prescriptions  Medication Sig Dispense Refill  . amLODipine (NORVASC) 10 MG tablet Take 10 mg by mouth daily.     Marland Kitchen apixaban (ELIQUIS) 5 MG TABS tablet Take 1 tablet (5 mg total) by mouth 2 (two) times  daily. 60 tablet 2  . atorvastatin (LIPITOR) 20 MG tablet Take 20 mg by mouth daily.     . fenofibrate (TRICOR) 145 MG tablet Take 145 mg by mouth daily.    Marland Kitchen HYDROcodone-acetaminophen (NORCO/VICODIN) 5-325 MG per tablet Take 1 tablet by mouth daily as needed.     Marland Kitchen losartan-hydrochlorothiazide (HYZAAR) 100-25 MG per tablet Take 1 tablet by mouth daily.     . ONE TOUCH ULTRA TEST test strip Use as directed    . potassium chloride (K-DUR) 10 MEQ tablet take 1 tablet by mouth once daily 31 tablet 1  . VIAGRA 100 MG tablet Take 50 mg by mouth as needed.      No current facility-administered medications for this visit.     Allergies:   Review of patient's allergies indicates no known allergies.    Social History:  The patient  reports that he has been smoking Cigarettes.  He started smoking about 45 years ago. He has a 43.00 pack-year smoking history. He has never used smokeless tobacco. He reports that he drinks alcohol. He reports that he does not use drugs.   Family History:  [name]  family history includes Arthritis in his mother and sister; Heart disease in his father; Hypertension in his mother; Melanoma in his father; Testicular cancer in his father.    ROS:  Please see the history of present illness.    Review of Systems: Constitutional:  denies fever, chills, diaphoresis, appetite change and fatigue.  HEENT: denies photophobia, eye pain, redness, hearing loss, ear pain, congestion, sore throat, rhinorrhea, sneezing, neck pain, neck stiffness and tinnitus.  Respiratory: denies SOB, DOE, cough, chest tightness, and wheezing.  Cardiovascular: denies chest pain, palpitations and leg swelling.  Gastrointestinal: denies nausea, vomiting, abdominal pain, diarrhea, constipation, blood in stool.  Genitourinary: denies dysuria, urgency, frequency, hematuria, flank pain and difficulty urinating.  Musculoskeletal: denies  myalgias, back pain, joint swelling, arthralgias and gait problem.    Skin: denies pallor, rash and wound.  Neurological: denies dizziness, seizures, syncope, weakness, light-headedness, numbness and headaches.   Hematological: denies adenopathy, easy bruising, personal or family bleeding history.  Psychiatric/ Behavioral: denies suicidal ideation, mood changes, confusion, nervousness, sleep disturbance and agitation.       All other systems are reviewed and negative.    PHYSICAL EXAM: VS:  BP 130/80   Pulse 72   Ht 5\' 10"  (1.778 m)   Wt 182 lb 3.2 oz (82.6 kg)   BMI 26.14 kg/m  , BMI Body mass index is 26.14 kg/m. GEN: Well nourished, well developed, in no acute distress  HEENT: normal  Neck: no JVD, carotid bruits, or masses Cardiac: irreg. Irreg. ; no murmurs, rubs, or gallops,no edema  Respiratory:  clear to auscultation bilaterally, normal work of breathing GI: soft, nontender, nondistended, + BS MS: no deformity or atrophy  Skin: warm and dry, no rash Neuro:  Strength and sensation are intact Psych: normal   EKG:  EKG is ordered today. The ekg ordered today demonstrates Atrial fib with a ventricular rate of 72.    Significant ST / T wave abn which are new since previous ECG       Recent Labs: 10/24/2015: BUN 11; Creat 0.97; Hemoglobin 15.6; Platelets 304; Potassium 3.7; Sodium 141    Lipid Panel No results found for: CHOL, TRIG, HDL, CHOLHDL, VLDL, LDLCALC, LDLDIRECT    Wt Readings from Last 3 Encounters:  06/08/16 182 lb 3.2 oz (82.6 kg)  01/31/16 190 lb 3.2 oz (86.3 kg)  12/16/15 186 lb 9.6 oz (84.6 kg)      Other studies Reviewed: Additional studies/ records that were reviewed today include: . Review of the above records demonstrates:    ASSESSMENT AND PLAN:  1.  Atrial fib:    His CHADS2 VASC score is 2 ( CAD, HTN) .  He has been on Eliquis. At some point we need to attempt a cardioversion but at this time he has significant EKG changes and I'm concerned that he has developed worsening coronary artery disease. I  don't want to schedule cardioversion when we might need to do a cath/PCI soon.  His rate is very well-controlled. He does have some fatigue but other than that he does not have any symptoms. He has moderate left atrial enlarged.  He has a history of coronary artery disease oh left and I'd would not be a good choice of antiarrhythmic. He's fairly young saw do not think that amiodarone is a good choice at this time. We may need to send him to the A. fib clinic for discussion of Tikosyn.   2. CAD : He is not having any angina.  He has  been having some fatigue and some shortness breath with exertion. This seems to have worsened about a year ago which corresponds to his atrial fibrillation. His EKG show significant ST and T wave changes compared to his EKG last year. This may be related to his atrial fibrillation but I'm concerned that he has worsening coronary artery disease. We'll schedule him for a [employer] study. I've encouraged him to stop smoking.  3. Essential hypertension:   Blood pressures well-controlled.  4. Hyperlipidemia:  Followed by his general medical doctor.  Current medicines are reviewed at length with the patient today.  The patient does not have concerns regarding medicines.  The following changes have been made:  no change  Labs/ tests ordered today include:  No orders of the defined types were placed in this encounter.  Disposition:   FU with me in 4-6 weeks       Scott Moores, MD  06/08/2016 10:39 AM    [address], [place], [place]  [address] Phone: [phone]; Fax: [phone]   Addendum:  Oct. 4, 2017 The myoview shows no ischemia but does reveal a reduced EF  Will try cardioversion and see if his symptoms improve We can get a repeat echo if needed.   Will set him up for later this week or next.

## 2016-06-08 NOTE — Patient Instructions (Signed)
Medication Instructions:  Your physician recommends that you continue on your current medications as directed. Please refer to the Current Medication list given to you today.   Labwork: None Ordered   Testing/Procedures: Your physician has requested that you have a lexiscan myoview. For further information please visit [email or web address]. Please follow instruction sheet, as given.   Follow-Up: Your physician recommends that you schedule a follow-up appointment in: 4-6 weeks with Dr. Acie Fredrickson   If you need a refill on your cardiac medications before your next appointment, please call your pharmacy.   Thank you for choosing [facility]! Christen Bame, RN [phone]

## 2016-06-25 ENCOUNTER — Telehealth (HOSPITAL_COMMUNITY): Payer: Self-pay | Admitting: *Deleted

## 2016-06-25 NOTE — Telephone Encounter (Signed)
Patient given detailed instructions per Myocardial Perfusion Study Information Sheet for the test on  06/27/16 . Patient notified to arrive 15 minutes early and that it is imperative to arrive on time for appointment to keep from having the test rescheduled.  If you need to cancel or reschedule your appointment, please call the office within 24 hours of your appointment. Failure to do so may result in a cancellation of your appointment, and a $50 no show fee. Patient verbalized understanding.  ,  Jacqueline    

## 2016-06-26 ENCOUNTER — Encounter (HOSPITAL_COMMUNITY): Payer: 59

## 2016-06-27 ENCOUNTER — Ambulatory Visit (HOSPITAL_COMMUNITY): Payer: 59 | Attending: Cardiovascular Disease

## 2016-06-27 DIAGNOSIS — R9431 Abnormal electrocardiogram [ECG] [EKG]: Secondary | ICD-10-CM | POA: Diagnosis present

## 2016-06-27 LAB — MYOCARDIAL PERFUSION IMAGING
CHL CUP NUCLEAR SDS: 0
CHL CUP NUCLEAR SSS: 4
CHL CUP STRESS STAGE 1 HR: 55 {beats}/min
CHL CUP STRESS STAGE 2 SPEED: 0 mph
CHL CUP STRESS STAGE 3 DBP: 90 mmHg
CHL CUP STRESS STAGE 3 GRADE: 0 %
CHL CUP STRESS STAGE 3 SBP: 130 mmHg
CHL CUP STRESS STAGE 3 SPEED: 0 mph
CHL CUP STRESS STAGE 4 GRADE: 0 %
CHL CUP STRESS STAGE 4 SPEED: 0 mph
CHL CUP STRESS STAGE 5 DBP: 85 mmHg
CHL CUP STRESS STAGE 5 GRADE: 0 %
CHL CUP STRESS STAGE 6 HR: 60 {beats}/min
CHL CUP STRESS STAGE 6 SBP: 160 mmHg
Estimated workload: 1 METS
LV sys vol: 91 mL
LVDIAVOL: 161 mL (ref 62–150)
Peak HR: 74 {beats}/min
Percent of predicted max HR: 47 %
RATE: 0.32
Rest HR: 46 {beats}/min
SRS: 4
Stage 1 DBP: 85 mmHg
Stage 1 Grade: 0 %
Stage 1 SBP: 140 mmHg
Stage 1 Speed: 0 mph
Stage 2 Grade: 0 %
Stage 2 HR: 53 {beats}/min
Stage 3 HR: 58 {beats}/min
Stage 4 HR: 74 {beats}/min
Stage 5 HR: 76 {beats}/min
Stage 5 SBP: 138 mmHg
Stage 5 Speed: 0 mph
Stage 6 DBP: 86 mmHg
Stage 6 Grade: 0 %
Stage 6 Speed: 0 mph
TID: 1.19

## 2016-06-27 MED ORDER — AMINOPHYLLINE 25 MG/ML IV SOLN
75.0000 mg | Freq: Once | INTRAVENOUS | Status: AC
  Administered 2016-06-27: 75 mg via INTRAVENOUS

## 2016-06-27 MED ORDER — TECHNETIUM TC 99M TETROFOSMIN IV KIT
32.9000 | PACK | Freq: Once | INTRAVENOUS | Status: AC | PRN
  Administered 2016-06-27: 32.9 via INTRAVENOUS
  Filled 2016-06-27: qty 33

## 2016-06-27 MED ORDER — TECHNETIUM TC 99M TETROFOSMIN IV KIT
10.3000 | PACK | Freq: Once | INTRAVENOUS | Status: AC | PRN
  Administered 2016-06-27: 10 via INTRAVENOUS
  Filled 2016-06-27: qty 10

## 2016-06-27 MED ORDER — REGADENOSON 0.4 MG/5ML IV SOLN
0.4000 mg | Freq: Once | INTRAVENOUS | Status: AC
  Administered 2016-06-27: 0.4 mg via INTRAVENOUS

## 2016-06-27 NOTE — Addendum Note (Signed)
Addended by: Thayer Headings on: 06/27/2016 05:56 PM   Modules accepted: Miquel Dunn

## 2016-06-28 ENCOUNTER — Telehealth: Payer: Self-pay | Admitting: Nurse Practitioner

## 2016-06-28 ENCOUNTER — Encounter (HOSPITAL_COMMUNITY): Payer: Self-pay | Admitting: Anesthesiology

## 2016-06-28 ENCOUNTER — Encounter: Payer: Self-pay | Admitting: Nurse Practitioner

## 2016-06-28 ENCOUNTER — Other Ambulatory Visit: Payer: Self-pay | Admitting: Cardiovascular Disease

## 2016-06-28 ENCOUNTER — Other Ambulatory Visit (INDEPENDENT_AMBULATORY_CARE_PROVIDER_SITE_OTHER): Payer: 59

## 2016-06-28 DIAGNOSIS — I4891 Unspecified atrial fibrillation: Secondary | ICD-10-CM

## 2016-06-28 LAB — CBC WITH DIFFERENTIAL/PLATELET
BASOS PCT: 0 %
Basophils Absolute: 0 cells/uL (ref 0–200)
EOS ABS: 67 {cells}/uL (ref 15–500)
Eosinophils Relative: 1 %
HEMATOCRIT: 47.8 % (ref 38.5–50.0)
HEMOGLOBIN: 16.4 g/dL (ref 13.2–17.1)
LYMPHS ABS: 2010 {cells}/uL (ref 850–3900)
LYMPHS PCT: 30 %
MCH: 30.3 pg (ref 27.0–33.0)
MCHC: 34.3 g/dL (ref 32.0–36.0)
MCV: 88.2 fL (ref 80.0–100.0)
MONO ABS: 737 {cells}/uL (ref 200–950)
MPV: 9.8 fL (ref 7.5–12.5)
Monocytes Relative: 11 %
NEUTROS ABS: 3886 {cells}/uL (ref 1500–7800)
Neutrophils Relative %: 58 %
Platelets: 311 10*3/uL (ref 140–400)
RBC: 5.42 MIL/uL (ref 4.20–5.80)
RDW: 13.8 % (ref 11.0–15.0)
WBC: 6.7 10*3/uL (ref 3.8–10.8)

## 2016-06-28 NOTE — Telephone Encounter (Signed)
Reviewed results of myocardial perfusion study with patient. He states he continues to have SOB and fatigue.  He believes his symptoms are related to atrial fib and would like to proceed with DCCV as discussed with Dr. Acie Fredrickson at last office visit.  Dr. Acie Fredrickson, who is in the office, is in agreement.  Cardioversion is scheduled for tomorrow, Friday 10/6, with Dr. Harrington Challenger at [facility].  I reviewed pre-procedure instructions with him and scheduled him for lab work today.  I have placed a copy of his instructions in the lab for him to pick up at his appointment.  He verbalized understanding and agreement with plan of care. He thanked me for the call.

## 2016-06-28 NOTE — Progress Notes (Signed)
Pt has had atrial fib for several months .   Has been on NOAC. ECG shows some TWI but myoview did not show any ischemia. [name] does not have CP - does have dyspnea. Will do cardioversion and see if he feels beter. If not better after cardioversion. Will anticipate cath     Mertie Moores, MD  06/28/2016 6:03 PM    [address],  [place], [place]  [address] Pager [phone] Phone: [phone]; Fax: [phone]

## 2016-06-29 ENCOUNTER — Ambulatory Visit (HOSPITAL_COMMUNITY)
Admission: RE | Admit: 2016-06-29 | Discharge: 2016-06-29 | Disposition: A | Discharge status: Home / Self Care | Payer: 59 | Source: Ambulatory Visit | Attending: Internal Medicine | Admitting: Internal Medicine

## 2016-06-29 ENCOUNTER — Encounter (HOSPITAL_COMMUNITY)
Admission: RE | Disposition: A | Discharge status: Home / Self Care | Payer: Self-pay | Source: Ambulatory Visit | Attending: Internal Medicine

## 2016-06-29 ENCOUNTER — Encounter (HOSPITAL_COMMUNITY): Payer: Self-pay | Admitting: *Deleted

## 2016-06-29 ENCOUNTER — Ambulatory Visit (HOSPITAL_COMMUNITY): Payer: 59 | Admitting: Anesthesiology

## 2016-06-29 DIAGNOSIS — E785 Hyperlipidemia, unspecified: Secondary | ICD-10-CM | POA: Diagnosis not present

## 2016-06-29 DIAGNOSIS — F17200 Nicotine dependence, unspecified, uncomplicated: Secondary | ICD-10-CM | POA: Insufficient documentation

## 2016-06-29 DIAGNOSIS — I2510 Atherosclerotic heart disease of native coronary artery without angina pectoris: Secondary | ICD-10-CM | POA: Diagnosis not present

## 2016-06-29 DIAGNOSIS — Z79899 Other long term (current) drug therapy: Secondary | ICD-10-CM | POA: Insufficient documentation

## 2016-06-29 DIAGNOSIS — R7303 Prediabetes: Secondary | ICD-10-CM | POA: Insufficient documentation

## 2016-06-29 DIAGNOSIS — I4891 Unspecified atrial fibrillation: Secondary | ICD-10-CM | POA: Insufficient documentation

## 2016-06-29 DIAGNOSIS — I252 Old myocardial infarction: Secondary | ICD-10-CM | POA: Insufficient documentation

## 2016-06-29 DIAGNOSIS — I10 Essential (primary) hypertension: Secondary | ICD-10-CM | POA: Diagnosis not present

## 2016-06-29 DIAGNOSIS — F419 Anxiety disorder, unspecified: Secondary | ICD-10-CM | POA: Diagnosis not present

## 2016-06-29 DIAGNOSIS — M1990 Unspecified osteoarthritis, unspecified site: Secondary | ICD-10-CM | POA: Diagnosis not present

## 2016-06-29 HISTORY — PX: CARDIOVERSION: SHX1299

## 2016-06-29 LAB — BASIC METABOLIC PANEL
BUN: 18 mg/dL (ref 7–25)
CHLORIDE: 107 mmol/L (ref 98–110)
CO2: 26 mmol/L (ref 20–31)
Calcium: 9.7 mg/dL (ref 8.6–10.3)
Creat: 1.08 mg/dL (ref 0.70–1.25)
Glucose, Bld: 106 mg/dL — ABNORMAL HIGH (ref 65–99)
POTASSIUM: 4 mmol/L (ref 3.5–5.3)
SODIUM: 141 mmol/L (ref 135–146)

## 2016-06-29 LAB — PROTIME-INR
INR: 1.2 — AB
PROTHROMBIN TIME: 12.2 s — AB (ref 9.0–11.5)

## 2016-06-29 SURGERY — CARDIOVERSION
Anesthesia: General

## 2016-06-29 MED ORDER — PROPOFOL 10 MG/ML IV BOLUS
INTRAVENOUS | Status: DC | PRN
  Administered 2016-06-29: 80 mg via INTRAVENOUS

## 2016-06-29 MED ORDER — SODIUM CHLORIDE 0.9 % IV SOLN
INTRAVENOUS | Status: DC
  Administered 2016-06-29: 10:00:00 via INTRAVENOUS

## 2016-06-29 MED ORDER — LIDOCAINE 2% (20 MG/ML) 5 ML SYRINGE
INTRAMUSCULAR | Status: DC | PRN
  Administered 2016-06-29: 100 mg via INTRAVENOUS

## 2016-06-29 NOTE — Anesthesia Procedure Notes (Signed)
Procedure Name: MAC Date/Time: 06/29/2016 10:15 AM Performed by: Eligha Bridegroom Pre-anesthesia Checklist: Patient identified, Emergency Drugs available, Suction available, Patient being monitored and Timeout performed Patient Re-evaluated:Patient Re-evaluated prior to inductionOxygen Delivery Method: Ambu bag Preoxygenation: Pre-oxygenation with 100% oxygen Intubation Type: IV induction Ventilation: Mask ventilation without difficulty

## 2016-06-29 NOTE — Discharge Instructions (Signed)
Electrical Cardioversion, Care After °Refer to this sheet in the next few weeks. These instructions provide you with information on caring for yourself after your procedure. Your health care provider may also give you more specific instructions. Your treatment has been planned according to current medical practices, but problems sometimes occur. Call your health care provider if you have any problems or questions after your procedure. °WHAT TO EXPECT AFTER THE PROCEDURE °After your procedure, it is typical to have the following sensations: °· Some redness on the skin where the shocks were delivered. If this is tender, a sunburn lotion or hydrocortisone cream may help. °· Possible return of an abnormal heart rhythm within hours or days after the procedure. °HOME CARE INSTRUCTIONS °· Take medicines only as directed by your health care provider. Be sure you understand how and when to take your medicine. °· Learn how to feel your pulse and check it often. °· Limit your activity for 48 hours after the procedure or as directed by your health care provider. °· Avoid or minimize caffeine and other stimulants as directed by your health care provider. °SEEK MEDICAL CARE IF: °· You feel like your heart is beating too fast or your pulse is not regular. °· You have any questions about your medicines. °· You have bleeding that will not stop. °SEEK IMMEDIATE MEDICAL CARE IF: °· You are dizzy or feel faint. °· It is hard to breathe or you feel short of breath. °· There is a change in discomfort in your chest. °· Your speech is slurred or you have trouble moving an arm or leg on one side of your body. °· You get a serious muscle cramp that does not go away. °· Your fingers or toes turn cold or blue. °  °This information is not intended to replace advice given to you by your health care provider. Make sure you discuss any questions you have with your health care provider. °  °Document Released: 07/01/2013 Document Revised: 10/01/2014  Document Reviewed: 07/01/2013 °Elsevier Interactive Patient Education ©2016 [employer] ° °

## 2016-06-29 NOTE — Transfer of Care (Signed)
Immediate Anesthesia Transfer of Care Note  Patient: Scott [name]  Procedure(s) Performed: Procedure(s): CARDIOVERSION (N/A)  Patient Location: PACU and Endoscopy Unit  Anesthesia Type:General  Level of Consciousness: awake, alert  and oriented  Airway & Oxygen Therapy: Patient Spontanous Breathing  Post-op Assessment: Report given to RN and Post -op Vital signs reviewed and stable  Post vital signs: Reviewed and stable  Last Vitals:  Vitals:   06/29/16 1029 06/29/16 1030  BP:    Pulse: (!) 54 (!) 53  Resp: 12 12  Temp:      Last Pain:  Vitals:   06/29/16 0927  TempSrc: Oral         Complications: No apparent anesthesia complications

## 2016-06-29 NOTE — Anesthesia Postprocedure Evaluation (Signed)
Anesthesia Post Note  Patient: Scott [name]  Procedure(s) Performed: Procedure(s) (LRB): CARDIOVERSION (N/A)  Patient location during evaluation: Endoscopy Anesthesia Type: General Level of consciousness: sedated and patient cooperative Pain management: pain level controlled Vital Signs Assessment: post-procedure vital signs reviewed and stable Respiratory status: spontaneous breathing Cardiovascular status: stable Anesthetic complications: no    Last Vitals:  Vitals:   06/29/16 1040 06/29/16 1050  BP: (!) 152/89 (!) 167/81  Pulse: (!) 49 (!) 46  Resp: 16 12  Temp:      Last Pain:  Vitals:   06/29/16 0927  TempSrc: Oral                 Nolon Nations

## 2016-06-29 NOTE — Anesthesia Preprocedure Evaluation (Addendum)
Anesthesia Evaluation  Patient identified by MRN, date of birth, ID band Patient awake    Reviewed: Allergy & Precautions, NPO status , Patient's Chart, lab work & pertinent test results, Unable to perform ROS - Chart review only  Airway Mallampati: II  TM Distance: >3 FB     Dental  (+) Teeth Intact   Pulmonary shortness of breath, Current Smoker,    breath sounds clear to auscultation       Cardiovascular hypertension, + CAD and + Past MI  + dysrhythmias Atrial Fibrillation  Rhythm:Irregular Rate:Normal     Neuro/Psych negative neurological ROS     GI/Hepatic   Endo/Other    Renal/GU      Musculoskeletal  (+) Arthritis ,   Abdominal   Peds  Hematology negative hematology ROS (+)   Anesthesia Other Findings   Reproductive/Obstetrics                             Anesthesia Physical Anesthesia Plan  ASA: III  Anesthesia Plan: General   Post-op Pain Management:    Induction:   Airway Management Planned: Mask  Additional Equipment:   Intra-op Plan:   Post-operative Plan:   Informed Consent: I have reviewed the patients History and Physical, chart, labs and discussed the procedure including the risks, benefits and alternatives for the proposed anesthesia with the patient or authorized representative who has indicated his/her understanding and acceptance.   Dental advisory given  Plan Discussed with: CRNA  Anesthesia Plan Comments:         Anesthesia Quick Evaluation

## 2016-06-29 NOTE — Op Note (Signed)
Patient anesthetized with Propofol by anesthesia  With pads in AP position, patient cardioverted to SR with 200 J synchronized biphasic energy   Procedure without complication. 12 Lead EKG pending

## 2016-07-02 ENCOUNTER — Encounter (HOSPITAL_COMMUNITY): Payer: Self-pay | Admitting: Internal Medicine

## 2016-07-02 ENCOUNTER — Telehealth: Payer: Self-pay | Admitting: Cardiovascular Disease

## 2016-07-02 NOTE — Telephone Encounter (Signed)
Scott [name] is calling because he wants to know if he can take Viagra today  Possibly , he had a cardioversion this past Friday . Please call . Can you leave message yes or no if he can. Thanks

## 2016-07-02 NOTE — Telephone Encounter (Signed)
Spoke with patient and advised that per Dr. Acie Fredrickson he may take viagra at any time. He states he wanted to be certain that it wasn't too soon after cardioversion. I advised him to call back with adverse side effects or concerns of persistent dizziness, light-headedness, or concerns about heart rate or blood pressure.  He verbalized understanding and agreement and thanked me for the call.

## 2016-07-11 ENCOUNTER — Other Ambulatory Visit: Payer: Self-pay

## 2016-07-11 MED ORDER — APIXABAN 5 MG PO TABS: 5.0000 mg | ORAL_TABLET | Freq: Two times a day (BID) | ORAL | 2 refills | Status: DC

## 2016-07-23 ENCOUNTER — Encounter: Payer: Self-pay | Admitting: Cardiovascular Disease

## 2016-07-23 ENCOUNTER — Ambulatory Visit (INDEPENDENT_AMBULATORY_CARE_PROVIDER_SITE_OTHER): Payer: 59 | Admitting: Cardiovascular Disease

## 2016-07-23 ENCOUNTER — Encounter: Payer: Self-pay | Admitting: Nurse Practitioner

## 2016-07-23 VITALS — BP 100/70 | HR 66 | Ht 70.0 in | Wt 188.4 lb

## 2016-07-23 DIAGNOSIS — I25118 Atherosclerotic heart disease of native coronary artery with other forms of angina pectoris: Secondary | ICD-10-CM | POA: Diagnosis not present

## 2016-07-23 DIAGNOSIS — I2510 Atherosclerotic heart disease of native coronary artery without angina pectoris: Secondary | ICD-10-CM

## 2016-07-23 DIAGNOSIS — I481 Persistent atrial fibrillation: Secondary | ICD-10-CM | POA: Diagnosis not present

## 2016-07-23 DIAGNOSIS — I4819 Other persistent atrial fibrillation: Secondary | ICD-10-CM

## 2016-07-23 LAB — CBC WITH DIFFERENTIAL/PLATELET
BASOS ABS: 0 {cells}/uL (ref 0–200)
BASOS PCT: 0 %
EOS PCT: 2 %
Eosinophils Absolute: 154 cells/uL (ref 15–500)
HCT: 46.5 % (ref 38.5–50.0)
HEMOGLOBIN: 16.1 g/dL (ref 13.2–17.1)
LYMPHS ABS: 2156 {cells}/uL (ref 850–3900)
Lymphocytes Relative: 28 %
MCH: 30.5 pg (ref 27.0–33.0)
MCHC: 34.6 g/dL (ref 32.0–36.0)
MCV: 88.1 fL (ref 80.0–100.0)
MONOS PCT: 12 %
MPV: 9.2 fL (ref 7.5–12.5)
Monocytes Absolute: 924 cells/uL (ref 200–950)
NEUTROS ABS: 4466 {cells}/uL (ref 1500–7800)
Neutrophils Relative %: 58 %
PLATELETS: 283 10*3/uL (ref 140–400)
RBC: 5.28 MIL/uL (ref 4.20–5.80)
RDW: 13 % (ref 11.0–15.0)
WBC: 7.7 10*3/uL (ref 3.8–10.8)

## 2016-07-23 LAB — COMPREHENSIVE METABOLIC PANEL
ALBUMIN: 4.1 g/dL (ref 3.6–5.1)
ALK PHOS: 62 U/L (ref 40–115)
ALT: 25 U/L (ref 9–46)
AST: 19 U/L (ref 10–35)
BILIRUBIN TOTAL: 0.5 mg/dL (ref 0.2–1.2)
BUN: 17 mg/dL (ref 7–25)
CALCIUM: 9.3 mg/dL (ref 8.6–10.3)
CO2: 27 mmol/L (ref 20–31)
Chloride: 107 mmol/L (ref 98–110)
Creat: 1.05 mg/dL (ref 0.70–1.25)
GLUCOSE: 100 mg/dL — AB (ref 65–99)
POTASSIUM: 3.4 mmol/L — AB (ref 3.5–5.3)
Sodium: 140 mmol/L (ref 135–146)
Total Protein: 6.5 g/dL (ref 6.1–8.1)

## 2016-07-23 LAB — PROTIME-INR
INR: 1.2 — ABNORMAL HIGH
PROTHROMBIN TIME: 12.4 s — AB (ref 9.0–11.5)

## 2016-07-23 MED ORDER — ASPIRIN EC 81 MG PO TBEC: 81.0000 mg | DELAYED_RELEASE_TABLET | Freq: Every day | ORAL | Status: DC

## 2016-07-23 NOTE — Patient Instructions (Addendum)
Medication Instructions:  START Aspirin 81 mg daily until after catheterization   Labwork: TODAY - cholesterol, complete metabolic panel, PT/INR. CBC   Testing/Procedures: Your physician has requested that you have a cardiac catheterization. Cardiac catheterization is used to diagnose and/or treat various heart conditions. Doctors may recommend this procedure for a number of different reasons. The most common reason is to evaluate chest pain. Chest pain can be a symptom of coronary artery disease (CAD), and cardiac catheterization can show whether plaque is narrowing or blocking your heart's arteries. This procedure is also used to evaluate the valves, as well as measure the blood flow and oxygen levels in different parts of your heart. For further information please visit [email or web address]. Please follow instruction sheet, as given.   Follow-Up: Your physician recommends that you schedule a follow-up appointment in: 6 weeks with Dr. Acie Fredrickson   If you need a refill on your cardiac medications before your next appointment, please call your pharmacy.   Thank you for choosing [facility]! Christen Bame, RN [phone]

## 2016-07-23 NOTE — Progress Notes (Signed)
Cardiology Office Note   Date:  07/23/2016   ID:  Tallie, Maritato [birth year], MRN [id number]  PCP:  Shirline Frees, MD  Cardiologist:   Mertie Moores, MD   Chief Complaint  Patient presents with  . Follow-up    atrial fib    Problem List 1. CAD - s/p Inf. MI 2. Anxiety 3. Hyperlipidemia 4. Essential hypertension 5. Atrial fibrillation 6. Borderline Diabetes     Scott [name] is a 65 y.o. male who presents for pre-op clearance prior to a colonoscopy.  He is on plavix and the GI team wanted permission to stop.  Feels fine.  No CP. Not breathing was well.  Since last fall, his breathing has not been as good.  Has had lots of bronchial type issues.   Has taked multiple rounds of antibiotics.   Has not been exercising .  Retired last year.    Still smoking .   Has been trying to cut back  Denies any palpitations.    Sept. 15, 2017: Scott [name] is here to follow up for  atrial fib  Is on the Eliquis regularly  Has been diagnosed with borderline DM  Has been seen by Dr. Ashok Cordia for shortness of breath .  Has had progressive shortness of breath . Has knee problems and shoulder issues Has been walking regularly  - walks 2 [name] a day   Has not had any CP  No pains similar to his CP prior to stenting    Oct. 30, 2017:  Scott [name] is seen for follow up of his atrial fib .  Still short of breath  The cardioversion may have helped the shortness of breath but is not as good as he would like to be    Past Medical History:  Diagnosis Date  . Atrial fibrillation ([place])   . CAD (coronary artery disease)   . HLD (hyperlipidemia)   . HTN (hypertension)   . IBS (irritable bowel syndrome)   . MI, old   . Tobacco abuse     Past Surgical History:  Procedure Laterality Date  . ANTERIOR CRUCIATE LIGAMENT REPAIR    . CARDIAC CATHETERIZATION     [place]  . CARDIOVERSION N/A 06/29/2016   Procedure: CARDIOVERSION;  Surgeon: Fay Records, MD;  Location: [facility] ENDOSCOPY;  Service:  Cardiovascular;  Laterality: N/A;  . CHOLECYSTECTOMY    . [place], 2000   x 4 times.  . TONSILLECTOMY       Current Outpatient Prescriptions  Medication Sig Dispense Refill  . amLODipine (NORVASC) 10 MG tablet Take 10 mg by mouth daily.     Marland Kitchen apixaban (ELIQUIS) 5 MG TABS tablet Take 1 tablet (5 mg total) by mouth 2 (two) times daily. 60 tablet 2  . atorvastatin (LIPITOR) 20 MG tablet Take 20 mg by mouth daily.     . fenofibrate (TRICOR) 145 MG tablet Take 145 mg by mouth daily.    Marland Kitchen HYDROcodone-acetaminophen (NORCO/VICODIN) 5-325 MG per tablet Take 1 tablet by mouth daily as needed.     Marland Kitchen losartan-hydrochlorothiazide (HYZAAR) 100-25 MG per tablet Take 1 tablet by mouth daily.     . ONE TOUCH ULTRA TEST test strip Use as directed    . potassium chloride (K-DUR) 10 MEQ tablet take 1 tablet by mouth once daily 31 tablet 1  . VIAGRA 100 MG tablet Take 50 mg by mouth as needed.      No current facility-administered medications for this visit.  Allergies:   Review of patient's allergies indicates no known allergies.    Social History:  The patient  reports that he has been smoking Cigarettes.  He started smoking about 45 years ago. He has a 43.00 pack-year smoking history. He has never used smokeless tobacco. He reports that he drinks alcohol. He reports that he does not use drugs.   Family History:  [name] family history includes Arthritis in his mother and sister; Heart disease in his father; Hypertension in his mother; Melanoma in his father; Testicular cancer in his father.    ROS:  Please see the history of present illness.    Review of Systems: Constitutional:  denies fever, chills, diaphoresis, appetite change and fatigue.  HEENT: denies photophobia, eye pain, redness, hearing loss, ear pain, congestion, sore throat, rhinorrhea, sneezing, neck pain, neck stiffness and tinnitus.  Respiratory: denies SOB, DOE, cough, chest tightness, and wheezing.    Cardiovascular: denies chest pain, palpitations and leg swelling.  Gastrointestinal: denies nausea, vomiting, abdominal pain, diarrhea, constipation, blood in stool.  Genitourinary: denies dysuria, urgency, frequency, hematuria, flank pain and difficulty urinating.  Musculoskeletal: denies  myalgias, back pain, joint swelling, arthralgias and gait problem.   Skin: denies pallor, rash and wound.  Neurological: denies dizziness, seizures, syncope, weakness, light-headedness, numbness and headaches.   Hematological: denies adenopathy, easy bruising, personal or family bleeding history.  Psychiatric/ Behavioral: denies suicidal ideation, mood changes, confusion, nervousness, sleep disturbance and agitation.       All other systems are reviewed and negative.    PHYSICAL EXAM: VS:  BP 100/70 (BP Location: Right Arm, Patient Position: Sitting, Cuff Size: Normal)   Pulse 66   Ht 5\' 10"  (1.778 m)   Wt 188 lb 6.4 oz (85.5 kg)   BMI 27.03 kg/m  , BMI Body mass index is 27.03 kg/m. GEN: Well nourished, well developed, in no acute distress  HEENT: normal  Neck: no JVD, carotid bruits, or masses Cardiac: irreg. Irreg. ; no murmurs, rubs, or gallops,no edema  Respiratory:  clear to auscultation bilaterally, normal work of breathing GI: soft, nontender, nondistended, + BS MS: no deformity or atrophy  Skin: warm and dry, no rash Neuro:  Strength and sensation are intact Psych: normal   EKG:  EKG is ordered today. The ekg ordered today demonstrates Atrial fib with a ventricular rate of 48.   TWI I the inferior      Recent Labs: 06/28/2016: BUN 18; Creat 1.08; Hemoglobin 16.4; Platelets 311; Potassium 4.0; Sodium 141    Lipid Panel No results found for: CHOL, TRIG, HDL, CHOLHDL, VLDL, LDLCALC, LDLDIRECT    Wt Readings from Last 3 Encounters:  07/23/16 188 lb 6.4 oz (85.5 kg)  06/27/16 182 lb (82.6 kg)  06/08/16 182 lb 3.2 oz (82.6 kg)      Other studies Reviewed: Additional  studies/ records that were reviewed today include: . Review of the above records demonstrates:    ASSESSMENT AND PLAN:  1.  Atrial fib:    His CHADS2 VASC score is 2 ( CAD, HTN) .  He has been on Eliquis.  Was successfully cardioverted Oct. 6.    He is now back in Atrial fib. He needs a cath -   This Friday will be the 4th week of anticoagulation Will schedule left heart cath with possible PCI  Discussed risks, benefits, options.   He understands and agrees to proceed     2. CAD : He is not having any angina.  He has  had shortness of breath . ECG shows TWI in the lateral leads. Will cath him this Friday    3. Essential hypertension:   Blood pressure is   well-controlled.  4. Hyperlipidemia:  Followed by his general medical doctor.  Current medicines are reviewed at length with the patient today.  The patient does not have concerns regarding medicines.  The following changes have been made:  no change  Labs/ tests ordered today include:  No orders of the defined types were placed in this encounter.  Disposition:   FU with me in 6  Weeks .     Mertie Moores, MD  07/23/2016 8:33 AM    [facility] [place], [place], [place]  [address] Phone: [phone]; Fax: [phone]

## 2016-07-26 ENCOUNTER — Other Ambulatory Visit: Payer: Self-pay | Admitting: Cardiovascular Disease

## 2016-07-26 DIAGNOSIS — I25118 Atherosclerotic heart disease of native coronary artery with other forms of angina pectoris: Secondary | ICD-10-CM

## 2016-07-26 NOTE — Progress Notes (Signed)
Cardiology Office Note   Date:  07/26/2016   ID:  TOMOHIRO MAZZUCCO, DOB 03-Mar-1951, MRN [id number]  PCP:  Shirline Frees, MD  Cardiologist:   Mertie Moores, MD   No chief complaint on file.  Problem List 1. CAD - s/p Inf. MI 2. Anxiety 3. Hyperlipidemia 4. Essential hypertension 5. Atrial fibrillation 6. Borderline Diabetes     CURLY DATU is a 65 y.o. male who presents for pre-op clearance prior to a colonoscopy.  He is on plavix and the GI team wanted permission to stop.  Feels fine.  No CP. Not breathing was well.  Since last fall, his breathing has not been as good.  Has had lots of bronchial type issues.   Has taked multiple rounds of antibiotics.   Has not been exercising .  Retired last year.    Still smoking .   Has been trying to cut back  Denies any palpitations.    Sept. 15, 2017: [name] is here to follow up for  atrial fib  Is on the Eliquis regularly  Has been diagnosed with borderline DM  Has been seen by Dr. Ashok Cordia for shortness of breath .  Has had progressive shortness of breath . Has knee problems and shoulder issues Has been walking regularly  - walks 2 miles a day   Has not had any CP  No pains similar to his CP prior to stenting    Oct. 30, 2017:  [name] is seen for follow up of his atrial fib .  Still short of breath  The cardioversion may have helped the shortness of breath but is not as good as he would like to be    Past Medical History:  Diagnosis Date  . Atrial fibrillation ([place])   . CAD (coronary artery disease)   . HLD (hyperlipidemia)   . HTN (hypertension)   . IBS (irritable bowel syndrome)   . MI, old   . Tobacco abuse     Past Surgical History:  Procedure Laterality Date  . ANTERIOR CRUCIATE LIGAMENT REPAIR    . CARDIAC CATHETERIZATION     [place]  . CARDIOVERSION N/A 06/29/2016   Procedure: CARDIOVERSION;  Surgeon: Fay Records, MD;  Location: [facility] ENDOSCOPY;  Service: Cardiovascular;  Laterality: N/A;  .  CHOLECYSTECTOMY    . [place], 2000   x 4 times.  . TONSILLECTOMY       Current Outpatient Prescriptions  Medication Sig Dispense Refill  . amLODipine (NORVASC) 10 MG tablet Take 10 mg by mouth daily.     Marland Kitchen apixaban (ELIQUIS) 5 MG TABS tablet Take 1 tablet (5 mg total) by mouth 2 (two) times daily. 60 tablet 2  . aspirin EC 81 MG tablet Take 1 tablet (81 mg total) by mouth daily. (Patient taking differently: Take 81 mg by mouth daily at 12 noon. )    . atorvastatin (LIPITOR) 20 MG tablet Take 20 mg by mouth daily.     . fenofibrate (TRICOR) 145 MG tablet Take 145 mg by mouth daily.    Marland Kitchen HYDROcodone-acetaminophen (NORCO/VICODIN) 5-325 MG per tablet Take 2 tablets by mouth every 8 (eight) hours as needed for moderate pain.     Marland Kitchen losartan-hydrochlorothiazide (HYZAAR) 100-25 MG per tablet Take 1 tablet by mouth daily.     . ONE TOUCH ULTRA TEST test strip Use as directed    . potassium chloride (K-DUR) 10 MEQ tablet take 1 tablet by mouth once daily 31 tablet  1  . VIAGRA 100 MG tablet Take 50 mg by mouth as needed for erectile dysfunction.      No current facility-administered medications for this visit.     Allergies:   Review of patient's allergies indicates no known allergies.    Social History:  The patient  reports that he has been smoking Cigarettes.  He started smoking about 45 years ago. He has a 43.00 pack-year smoking history. He has never used smokeless tobacco. He reports that he drinks alcohol. He reports that he does not use drugs.   Family History:  [name] family history includes Arthritis in his mother and sister; Heart disease in his father; Hypertension in his mother; Melanoma in his father; Testicular cancer in his father.    ROS:  Please see the history of present illness.    Review of Systems: Constitutional:  denies fever, chills, diaphoresis, appetite change and fatigue.  HEENT: denies photophobia, eye pain, redness, hearing loss,  ear pain, congestion, sore throat, rhinorrhea, sneezing, neck pain, neck stiffness and tinnitus.  Respiratory: denies SOB, DOE, cough, chest tightness, and wheezing.  Cardiovascular: denies chest pain, palpitations and leg swelling.  Gastrointestinal: denies nausea, vomiting, abdominal pain, diarrhea, constipation, blood in stool.  Genitourinary: denies dysuria, urgency, frequency, hematuria, flank pain and difficulty urinating.  Musculoskeletal: denies  myalgias, back pain, joint swelling, arthralgias and gait problem.   Skin: denies pallor, rash and wound.  Neurological: denies dizziness, seizures, syncope, weakness, light-headedness, numbness and headaches.   Hematological: denies adenopathy, easy bruising, personal or family bleeding history.  Psychiatric/ Behavioral: denies suicidal ideation, mood changes, confusion, nervousness, sleep disturbance and agitation.       All other systems are reviewed and negative.    PHYSICAL EXAM: VS:  There were no vitals taken for this visit. , BMI There is no height or weight on file to calculate BMI. GEN: Well nourished, well developed, in no acute distress  HEENT: normal  Neck: no JVD, carotid bruits, or masses Cardiac: irreg. Irreg. ; no murmurs, rubs, or gallops,no edema  Respiratory:  clear to auscultation bilaterally, normal work of breathing GI: soft, nontender, nondistended, + BS MS: no deformity or atrophy  Skin: warm and dry, no rash Neuro:  Strength and sensation are intact Psych: normal   EKG:  EKG is ordered today. The ekg ordered today demonstrates Atrial fib with a ventricular rate of 48.   TWI I the inferior      Recent Labs: 07/23/2016: ALT 25; BUN 17; Creat 1.05; Hemoglobin 16.1; Platelets 283; Potassium 3.4; Sodium 140    Lipid Panel No results found for: CHOL, TRIG, HDL, CHOLHDL, VLDL, LDLCALC, LDLDIRECT    Wt Readings from Last 3 Encounters:  07/23/16 188 lb 6.4 oz (85.5 kg)  06/27/16 182 lb (82.6 kg)    06/08/16 182 lb 3.2 oz (82.6 kg)      Other studies Reviewed: Additional studies/ records that were reviewed today include: . Review of the above records demonstrates:    ASSESSMENT AND PLAN:  1.  Atrial fib:    His CHADS2 VASC score is 2 ( CAD, HTN) .  He has been on Eliquis.  Was successfully cardioverted Oct. 6.    He is now back in Atrial fib. He needs a cath -   This Friday will be the 4th week of anticoagulation Will schedule left heart cath with possible PCI  Discussed risks, benefits, options.   He understands and agrees to proceed     2. CAD :  He is not having any angina.  He has had shortness of breath . ECG shows TWI in the lateral leads. Will cath him this Friday    3. Essential hypertension:   Blood pressure is   well-controlled.  4. Hyperlipidemia:  Followed by his general medical doctor.  Current medicines are reviewed at length with the patient today.  The patient does not have concerns regarding medicines.  The following changes have been made:  no change  Labs/ tests ordered today include:  No orders of the defined types were placed in this encounter.  Disposition:   FU with me in 6  Weeks .     Mertie Moores, MD  07/26/2016 2:00 PM    [address], [place], [place]  [address] Phone: [phone]; Fax: [phone]

## 2016-07-27 ENCOUNTER — Ambulatory Visit (HOSPITAL_COMMUNITY)
Admission: RE | Admit: 2016-07-27 | Discharge: 2016-07-27 | Disposition: A | Discharge status: Home / Self Care | Payer: 59 | Source: Ambulatory Visit | Attending: Cardiovascular Disease | Admitting: Cardiovascular Disease

## 2016-07-27 ENCOUNTER — Encounter (HOSPITAL_COMMUNITY): Payer: Self-pay | Admitting: Cardiovascular Disease

## 2016-07-27 ENCOUNTER — Encounter (HOSPITAL_COMMUNITY)
Admission: RE | Disposition: A | Discharge status: Home / Self Care | Payer: Self-pay | Source: Ambulatory Visit | Attending: Cardiovascular Disease

## 2016-07-27 DIAGNOSIS — E785 Hyperlipidemia, unspecified: Secondary | ICD-10-CM | POA: Insufficient documentation

## 2016-07-27 DIAGNOSIS — Z7901 Long term (current) use of anticoagulants: Secondary | ICD-10-CM | POA: Insufficient documentation

## 2016-07-27 DIAGNOSIS — I10 Essential (primary) hypertension: Secondary | ICD-10-CM | POA: Diagnosis not present

## 2016-07-27 DIAGNOSIS — I2582 Chronic total occlusion of coronary artery: Secondary | ICD-10-CM | POA: Insufficient documentation

## 2016-07-27 DIAGNOSIS — I482 Chronic atrial fibrillation: Secondary | ICD-10-CM | POA: Insufficient documentation

## 2016-07-27 DIAGNOSIS — I252 Old myocardial infarction: Secondary | ICD-10-CM | POA: Insufficient documentation

## 2016-07-27 DIAGNOSIS — I2510 Atherosclerotic heart disease of native coronary artery without angina pectoris: Secondary | ICD-10-CM | POA: Insufficient documentation

## 2016-07-27 DIAGNOSIS — F17210 Nicotine dependence, cigarettes, uncomplicated: Secondary | ICD-10-CM | POA: Diagnosis not present

## 2016-07-27 DIAGNOSIS — Z955 Presence of coronary angioplasty implant and graft: Secondary | ICD-10-CM | POA: Diagnosis not present

## 2016-07-27 DIAGNOSIS — Z79899 Other long term (current) drug therapy: Secondary | ICD-10-CM | POA: Diagnosis not present

## 2016-07-27 DIAGNOSIS — R931 Abnormal findings on diagnostic imaging of heart and coronary circulation: Secondary | ICD-10-CM | POA: Insufficient documentation

## 2016-07-27 DIAGNOSIS — Z7902 Long term (current) use of antithrombotics/antiplatelets: Secondary | ICD-10-CM | POA: Diagnosis not present

## 2016-07-27 DIAGNOSIS — R7303 Prediabetes: Secondary | ICD-10-CM | POA: Insufficient documentation

## 2016-07-27 DIAGNOSIS — R0609 Other forms of dyspnea: Secondary | ICD-10-CM | POA: Diagnosis not present

## 2016-07-27 DIAGNOSIS — I25118 Atherosclerotic heart disease of native coronary artery with other forms of angina pectoris: Secondary | ICD-10-CM

## 2016-07-27 HISTORY — PX: CARDIAC CATHETERIZATION: SHX172

## 2016-07-27 LAB — GLUCOSE, CAPILLARY: GLUCOSE-CAPILLARY: 122 mg/dL — AB (ref 65–99)

## 2016-07-27 LAB — POTASSIUM: Potassium: 3.3 mmol/L — ABNORMAL LOW (ref 3.5–5.1)

## 2016-07-27 SURGERY — LEFT HEART CATH AND CORONARY ANGIOGRAPHY

## 2016-07-27 MED ORDER — VERAPAMIL HCL 2.5 MG/ML IV SOLN
INTRAVENOUS | Status: AC
  Filled 2016-07-27: qty 2

## 2016-07-27 MED ORDER — SODIUM CHLORIDE 0.9 % WEIGHT BASED INFUSION
3.0000 mL/kg/h | INTRAVENOUS | Status: DC
  Administered 2016-07-27: 3 mL/kg/h via INTRAVENOUS

## 2016-07-27 MED ORDER — SODIUM CHLORIDE 0.9% FLUSH: 3.0000 mL | Freq: Two times a day (BID) | INTRAVENOUS | Status: DC

## 2016-07-27 MED ORDER — SODIUM CHLORIDE 0.9% FLUSH: 3.0000 mL | INTRAVENOUS | Status: DC | PRN

## 2016-07-27 MED ORDER — FENTANYL CITRATE (PF) 100 MCG/2ML IJ SOLN
INTRAMUSCULAR | Status: AC
  Filled 2016-07-27: qty 2

## 2016-07-27 MED ORDER — ONDANSETRON HCL 4 MG/2ML IJ SOLN: 4.0000 mg | Freq: Four times a day (QID) | INTRAMUSCULAR | Status: DC | PRN

## 2016-07-27 MED ORDER — HEPARIN (PORCINE) IN NACL 2-0.9 UNIT/ML-% IJ SOLN
INTRAMUSCULAR | Status: DC | PRN
  Administered 2016-07-27: 08:00:00 via INTRA_ARTERIAL

## 2016-07-27 MED ORDER — SODIUM CHLORIDE 0.9 % IV SOLN: 250.0000 mL | INTRAVENOUS | Status: DC | PRN

## 2016-07-27 MED ORDER — LIDOCAINE HCL (PF) 1 % IJ SOLN
INTRAMUSCULAR | Status: DC | PRN
  Administered 2016-07-27: 4 mL via INTRADERMAL

## 2016-07-27 MED ORDER — SODIUM CHLORIDE 0.9 % WEIGHT BASED INFUSION: 1.0000 mL/kg/h | INTRAVENOUS | Status: DC

## 2016-07-27 MED ORDER — HEPARIN (PORCINE) IN NACL 2-0.9 UNIT/ML-% IJ SOLN
INTRAMUSCULAR | Status: DC | PRN
  Administered 2016-07-27: 1000 mL via INTRA_ARTERIAL

## 2016-07-27 MED ORDER — HEPARIN SODIUM (PORCINE) 1000 UNIT/ML IJ SOLN
INTRAMUSCULAR | Status: DC | PRN
  Administered 2016-07-27: 5000 [IU] via INTRAVENOUS

## 2016-07-27 MED ORDER — LIDOCAINE HCL (PF) 1 % IJ SOLN
INTRAMUSCULAR | Status: AC
  Filled 2016-07-27: qty 30

## 2016-07-27 MED ORDER — SODIUM CHLORIDE 0.9 % WEIGHT BASED INFUSION: 3.0000 mL/kg/h | INTRAVENOUS | Status: DC

## 2016-07-27 MED ORDER — MIDAZOLAM HCL 2 MG/2ML IJ SOLN
INTRAMUSCULAR | Status: DC | PRN
  Administered 2016-07-27: 1 mg via INTRAVENOUS
  Administered 2016-07-27: 2 mg via INTRAVENOUS

## 2016-07-27 MED ORDER — FENTANYL CITRATE (PF) 100 MCG/2ML IJ SOLN
INTRAMUSCULAR | Status: DC | PRN
  Administered 2016-07-27 (×2): 25 ug via INTRAVENOUS

## 2016-07-27 MED ORDER — HEPARIN SODIUM (PORCINE) 1000 UNIT/ML IJ SOLN
INTRAMUSCULAR | Status: AC
  Filled 2016-07-27: qty 1

## 2016-07-27 MED ORDER — ASPIRIN 81 MG PO CHEW: 81.0000 mg | CHEWABLE_TABLET | ORAL | Status: DC

## 2016-07-27 MED ORDER — IOPAMIDOL (ISOVUE-370) INJECTION 76%
INTRAVENOUS | Status: DC | PRN
  Administered 2016-07-27: 60 mL via INTRA_ARTERIAL

## 2016-07-27 MED ORDER — HEPARIN (PORCINE) IN NACL 2-0.9 UNIT/ML-% IJ SOLN
INTRAMUSCULAR | Status: AC
  Filled 2016-07-27: qty 1000

## 2016-07-27 MED ORDER — ACETAMINOPHEN 325 MG PO TABS: 650.0000 mg | ORAL_TABLET | ORAL | Status: DC | PRN

## 2016-07-27 MED ORDER — MIDAZOLAM HCL 2 MG/2ML IJ SOLN
INTRAMUSCULAR | Status: AC
  Filled 2016-07-27: qty 2

## 2016-07-27 MED ORDER — IOPAMIDOL (ISOVUE-370) INJECTION 76%
INTRAVENOUS | Status: AC
  Filled 2016-07-27: qty 100

## 2016-07-27 SURGICAL SUPPLY — 11 items

## 2016-07-27 NOTE — Discharge Instructions (Signed)
Radial Site Care °Refer to this sheet in the next few weeks. These instructions provide you with information about caring for yourself after your procedure. Your health care provider may also give you more specific instructions. Your treatment has been planned according to current medical practices, but problems sometimes occur. Call your health care provider if you have any problems or questions after your procedure. °WHAT TO EXPECT AFTER THE PROCEDURE °After your procedure, it is typical to have the following: °· Bruising at the radial site that usually fades within 1-2 weeks. °· Blood collecting in the tissue (hematoma) that may be painful to the touch. It should usually decrease in size and tenderness within 1-2 weeks. °HOME CARE INSTRUCTIONS °· Take medicines only as directed by your health care provider. °· You may shower 24-48 hours after the procedure or as directed by your health care provider. Remove the bandage (dressing) and gently wash the site with plain soap and water. Pat the area dry with a clean towel. Do not rub the site, because this may cause bleeding. °· Do not take baths, swim, or use a hot tub until your health care provider approves. °· Check your insertion site every day for redness, swelling, or drainage. °· Do not apply powder or lotion to the site. °· Do not flex or bend the affected arm for 24 hours or as directed by your health care provider. °· Do not push or pull heavy objects with the affected arm for 24 hours or as directed by your health care provider. °· Do not lift over 10 lb (4.5 kg) for 5 days after your procedure or as directed by your health care provider. °· Ask your health care provider when it is okay to: °¨ Return to work or school. °¨ Resume usual physical activities or sports. °¨ Resume sexual activity. °· Do not drive home if you are discharged the same day as the procedure. Have someone else drive you. °· You may drive 24 hours after the procedure unless otherwise  instructed by your health care provider. °· Do not operate machinery or power tools for 24 hours after the procedure. °· If your procedure was done as an outpatient procedure, which means that you went home the same day as your procedure, a responsible adult should be with you for the first 24 hours after you arrive home. °· Keep all follow-up visits as directed by your health care provider. This is important. °SEEK MEDICAL CARE IF: °· You have a fever. °· You have chills. °· You have increased bleeding from the radial site. Hold pressure on the site. °SEEK IMMEDIATE MEDICAL CARE IF: °· You have unusual pain at the radial site. °· You have redness, warmth, or swelling at the radial site. °· You have drainage (other than a small amount of blood on the dressing) from the radial site. °· The radial site is bleeding, and the bleeding does not stop after 30 minutes of holding steady pressure on the site. °· Your arm or hand becomes pale, cool, tingly, or numb. °  °This information is not intended to replace advice given to you by your health care provider. Make sure you discuss any questions you have with your health care provider. °  °Document Released: 10/13/2010 Document Revised: 10/01/2014 Document Reviewed: 03/29/2014 °Elsevier Interactive Patient Education ©2016 [employer] Inc[employer] ° °

## 2016-07-27 NOTE — H&P (View-Only) (Signed)
Cardiology Office Note   Date:  07/23/2016   ID:  [name], Scott 04-16-1951, MRN [id number]  PCP:  Shirline Frees, MD  Cardiologist:   Mertie Moores, MD   Chief Complaint  Patient presents with  . Follow-up    atrial fib    Problem List 1. CAD - s/p Inf. MI 2. Anxiety 3. Hyperlipidemia 4. Essential hypertension 5. Atrial fibrillation 6. Borderline Diabetes     Scott [name] is a 65 y.o. male who presents for pre-op clearance prior to a colonoscopy.  He is on plavix and the GI team wanted permission to stop.  Feels fine.  No CP. Not breathing was well.  Since last fall, his breathing has not been as good.  Has had lots of bronchial type issues.   Has taked multiple rounds of antibiotics.   Has not been exercising .  Retired last year.    Still smoking .   Has been trying to cut back  Denies any palpitations.    Sept. 15, 2017: Scott [name] is here to follow up for  atrial fib  Is on the Eliquis regularly  Has been diagnosed with borderline DM  Has been seen by Dr. Ashok Cordia for shortness of breath .  Has had progressive shortness of breath . Has knee problems and shoulder issues Has been walking regularly  - walks 2 miles a day   Has not had any CP  No pains similar to his CP prior to stenting    Oct. 30, 2017:  Scott [name] is seen for follow up of his atrial fib .  Still short of breath  The cardioversion may have helped the shortness of breath but is not as good as he would like to be    Past Medical History:  Diagnosis Date  . Atrial fibrillation ([place])   . CAD (coronary artery disease)   . HLD (hyperlipidemia)   . HTN (hypertension)   . IBS (irritable bowel syndrome)   . MI, old   . Tobacco abuse     Past Surgical History:  Procedure Laterality Date  . ANTERIOR CRUCIATE LIGAMENT REPAIR    . CARDIAC CATHETERIZATION     [facility]  . CARDIOVERSION N/A 06/29/2016   Procedure: CARDIOVERSION;  Surgeon: Fay Records, MD;  Location: [facility] ENDOSCOPY;  Service:  Cardiovascular;  Laterality: N/A;  . CHOLECYSTECTOMY    . [place], 2000   x 4 times.  . TONSILLECTOMY       Current Outpatient Prescriptions  Medication Sig Dispense Refill  . amLODipine (NORVASC) 10 MG tablet Take 10 mg by mouth daily.     Marland Kitchen apixaban (ELIQUIS) 5 MG TABS tablet Take 1 tablet (5 mg total) by mouth 2 (two) times daily. 60 tablet 2  . atorvastatin (LIPITOR) 20 MG tablet Take 20 mg by mouth daily.     . fenofibrate (TRICOR) 145 MG tablet Take 145 mg by mouth daily.    Marland Kitchen HYDROcodone-acetaminophen (NORCO/VICODIN) 5-325 MG per tablet Take 1 tablet by mouth daily as needed.     Marland Kitchen losartan-hydrochlorothiazide (HYZAAR) 100-25 MG per tablet Take 1 tablet by mouth daily.     . ONE TOUCH ULTRA TEST test strip Use as directed    . potassium chloride (K-DUR) 10 MEQ tablet take 1 tablet by mouth once daily 31 tablet 1  . VIAGRA 100 MG tablet Take 50 mg by mouth as needed.      No current facility-administered medications for this visit.  Allergies:   Review of patient's allergies indicates no known allergies.    Social History:  The patient  reports that he has been smoking Cigarettes.  He started smoking about 45 years ago. He has a 43.00 pack-year smoking history. He has never used smokeless tobacco. He reports that he drinks alcohol. He reports that he does not use drugs.   Family History:  [name] family history includes Arthritis in his mother and sister; Heart disease in his father; Hypertension in his mother; Melanoma in his father; Testicular cancer in his father.    ROS:  Please see the history of present illness.    Review of Systems: Constitutional:  denies fever, chills, diaphoresis, appetite change and fatigue.  HEENT: denies photophobia, eye pain, redness, hearing loss, ear pain, congestion, sore throat, rhinorrhea, sneezing, neck pain, neck stiffness and tinnitus.  Respiratory: denies SOB, DOE, cough, chest tightness, and wheezing.    Cardiovascular: denies chest pain, palpitations and leg swelling.  Gastrointestinal: denies nausea, vomiting, abdominal pain, diarrhea, constipation, blood in stool.  Genitourinary: denies dysuria, urgency, frequency, hematuria, flank pain and difficulty urinating.  Musculoskeletal: denies  myalgias, back pain, joint swelling, arthralgias and gait problem.   Skin: denies pallor, rash and wound.  Neurological: denies dizziness, seizures, syncope, weakness, light-headedness, numbness and headaches.   Hematological: denies adenopathy, easy bruising, personal or family bleeding history.  Psychiatric/ Behavioral: denies suicidal ideation, mood changes, confusion, nervousness, sleep disturbance and agitation.       All other systems are reviewed and negative.    PHYSICAL EXAM: VS:  BP 100/70 (BP Location: Right Arm, Patient Position: Sitting, Cuff Size: Normal)   Pulse 66   Ht 5\' 10"  (1.778 m)   Wt 188 lb 6.4 oz (85.5 kg)   BMI 27.03 kg/m  , BMI Body mass index is 27.03 kg/m. GEN: Well nourished, well developed, in no acute distress  HEENT: normal  Neck: no JVD, carotid bruits, or masses Cardiac: irreg. Irreg. ; no murmurs, rubs, or gallops,no edema  Respiratory:  clear to auscultation bilaterally, normal work of breathing GI: soft, nontender, nondistended, + BS MS: no deformity or atrophy  Skin: warm and dry, no rash Neuro:  Strength and sensation are intact Psych: normal   EKG:  EKG is ordered today. The ekg ordered today demonstrates Atrial fib with a ventricular rate of 48.   TWI I the inferior      Recent Labs: 06/28/2016: BUN 18; Creat 1.08; Hemoglobin 16.4; Platelets 311; Potassium 4.0; Sodium 141    Lipid Panel No results found for: CHOL, TRIG, HDL, CHOLHDL, VLDL, LDLCALC, LDLDIRECT    Wt Readings from Last 3 Encounters:  07/23/16 188 lb 6.4 oz (85.5 kg)  06/27/16 182 lb (82.6 kg)  06/08/16 182 lb 3.2 oz (82.6 kg)      Other studies Reviewed: Additional  studies/ records that were reviewed today include: . Review of the above records demonstrates:    ASSESSMENT AND PLAN:  1.  Atrial fib:    His CHADS2 VASC score is 2 ( CAD, HTN) .  He has been on Eliquis.  Was successfully cardioverted Oct. 6.    He is now back in Atrial fib. He needs a cath -   This Friday will be the 4th week of anticoagulation Will schedule left heart cath with possible PCI  Discussed risks, benefits, options.   He understands and agrees to proceed     2. CAD : He is not having any angina.  He has  had shortness of breath . ECG shows TWI in the lateral leads. Will cath him this Friday    3. Essential hypertension:   Blood pressure is   well-controlled.  4. Hyperlipidemia:  Followed by his general medical doctor.  Current medicines are reviewed at length with the patient today.  The patient does not have concerns regarding medicines.  The following changes have been made:  no change  Labs/ tests ordered today include:  No orders of the defined types were placed in this encounter.  Disposition:   FU with me in 6  Weeks .     Mertie Moores, MD  07/23/2016 8:33 AM    [facility] [address], [place], [place]  [address] Phone: [phone]; Fax: [phone]

## 2016-07-27 NOTE — Interval H&P Note (Signed)
Cath Lab Visit (complete for each Cath Lab visit)  Clinical Evaluation Leading to the Procedure:   ACS: No.  Non-ACS:    Anginal Classification: CCS II  Anti-ischemic medical therapy: Minimal Therapy (1 class of medications)  Non-Invasive Test Results: No non-invasive testing performed  Prior CABG: No previous CABG      History and Physical Interval Note:  07/27/2016 7:42 AM  Scott [name]  has presented today for surgery, with the diagnosis of cad - angina  The various methods of treatment have been discussed with the patient and family. After consideration of risks, benefits and other options for treatment, the patient has consented to  Procedure(s): Left Heart Cath and Coronary Angiography (N/A) as a surgical intervention .  [name] history has been reviewed, patient examined, no change in status, stable for surgery.  I have reviewed the patient's chart and labs.  Questions were answered to the patient's satisfaction.     Sherren Mocha

## 2016-08-06 ENCOUNTER — Telehealth: Payer: Self-pay | Admitting: Cardiovascular Disease

## 2016-08-06 MED ORDER — POTASSIUM CHLORIDE ER 10 MEQ PO TBCR: 10.0000 meq | EXTENDED_RELEASE_TABLET | Freq: Every day | ORAL | 11 refills | Status: DC

## 2016-08-06 NOTE — Telephone Encounter (Signed)
Pt's Rx was sent to pt's pharmacy as requested. Confirmation received.  °

## 2016-08-06 NOTE — Telephone Encounter (Signed)
[facility] is called for a refill on patient's K+

## 2016-08-29 ENCOUNTER — Encounter: Payer: Self-pay | Admitting: Cardiovascular Disease

## 2016-09-03 ENCOUNTER — Encounter (INDEPENDENT_AMBULATORY_CARE_PROVIDER_SITE_OTHER): Payer: Self-pay

## 2016-09-03 ENCOUNTER — Ambulatory Visit (INDEPENDENT_AMBULATORY_CARE_PROVIDER_SITE_OTHER): Payer: 59 | Admitting: Cardiovascular Disease

## 2016-09-03 ENCOUNTER — Encounter: Payer: Self-pay | Admitting: Cardiovascular Disease

## 2016-09-03 VITALS — BP 146/80 | HR 70 | Ht 70.0 in | Wt 185.1 lb

## 2016-09-03 DIAGNOSIS — I482 Chronic atrial fibrillation, unspecified: Secondary | ICD-10-CM

## 2016-09-03 DIAGNOSIS — I25118 Atherosclerotic heart disease of native coronary artery with other forms of angina pectoris: Secondary | ICD-10-CM

## 2016-09-03 NOTE — Progress Notes (Signed)
Cardiology Office Note   Date:  09/03/2016   ID:  Scott [name], DOB 04-Feb-1951, MRN [id number]  PCP:  Shirline Frees, MD  Cardiologist:   Mertie Moores, MD   Chief Complaint  Patient presents with  . Coronary Artery Disease  . Atrial Fibrillation   Problem List 1. CAD - s/p Inf. MI 2. Anxiety 3. Hyperlipidemia 4. Essential hypertension 5. Atrial fibrillation 6. Borderline Diabetes     Scott [name] is a 65 y.o. male who presents for pre-op clearance prior to a colonoscopy.  He is on plavix and the GI team wanted permission to stop.  Feels fine.  No CP. Not breathing was well.  Since last fall, his breathing has not been as good.  Has had lots of bronchial type issues.   Has taked multiple rounds of antibiotics.   Has not been exercising .  Retired last year.    Still smoking .   Has been trying to cut back  Denies any palpitations.    Sept. 15, 2017: Scott [name] is here to follow up for  atrial fib  Is on the Eliquis regularly  Has been diagnosed with borderline DM  Has been seen by Dr. Ashok Cordia for shortness of breath .  Has had progressive shortness of breath . Has knee problems and shoulder issues Has been walking regularly  - walks 2 miles a day   Has not had any CP  No pains similar to his CP prior to stenting    Oct. 30, 2017:  Scott [name] is seen for follow up of his atrial fib .  Still short of breath  The cardioversion may have helped the shortness of breath but is not as good as he would like to be   Dec. 11 , 2017:  Scott [name] seems to be slightly  doing better. He had a cardiac catheterization on November 3. He had a widely patent left anterior descending artery. The right coronary artery stent was widely patent. The left circumflex artery was totally occluded and had right to left collaterals. His left ventricular systolic function was normal with an EF of 55-60%. He had normal left ventricular end-diastolic pressure.  Staying busy with yard work  - getting  up leaves .   Still smoking   Wants to consider cardioversion Flecainide is not an option due to CAD Amio is not a good choice due to possible  lung disease.   Past Medical History:  Diagnosis Date  . Atrial fibrillation ([place])   . CAD (coronary artery disease)   . HLD (hyperlipidemia)   . HTN (hypertension)   . IBS (irritable bowel syndrome)   . MI, old   . Tobacco abuse     Past Surgical History:  Procedure Laterality Date  . ANTERIOR CRUCIATE LIGAMENT REPAIR    . CARDIAC CATHETERIZATION     [facility]  . CARDIAC CATHETERIZATION N/A 07/27/2016   Procedure: Left Heart Cath and Coronary Angiography;  Surgeon: Sherren Mocha, MD;  Location: [place] CV LAB;  Service: Cardiovascular;  Laterality: N/A;  . CARDIOVERSION N/A 06/29/2016   Procedure: CARDIOVERSION;  Surgeon: Fay Records, MD;  Location[facility] ENDOSCOPY;  Service: Cardiovascular;  Laterality: N/A;  . CHOLECYSTECTOMY    . [place], 2000   x 4 times.  . TONSILLECTOMY       Current Outpatient Prescriptions  Medication Sig Dispense Refill  . amLODipine (NORVASC) 10 MG tablet Take 10 mg by mouth daily.     Marland Kitchen  apixaban (ELIQUIS) 5 MG TABS tablet Take 1 tablet (5 mg total) by mouth 2 (two) times daily. 60 tablet 2  . aspirin EC 81 MG tablet Take 1 tablet (81 mg total) by mouth daily. (Patient taking differently: Take 81 mg by mouth daily at 12 noon. )    . atorvastatin (LIPITOR) 20 MG tablet Take 20 mg by mouth daily.     . fenofibrate (TRICOR) 145 MG tablet Take 145 mg by mouth daily.    Marland Kitchen HYDROcodone-acetaminophen (NORCO/VICODIN) 5-325 MG per tablet Take 2 tablets by mouth every 8 (eight) hours as needed for moderate pain.     Marland Kitchen losartan-hydrochlorothiazide (HYZAAR) 100-25 MG per tablet Take 1 tablet by mouth daily.     . ONE TOUCH ULTRA TEST test strip Use as directed    . potassium chloride (K-DUR) 10 MEQ tablet Take 1 tablet (10 mEq total) by mouth daily. 30 tablet 11  . VIAGRA 100 MG tablet Take 50  mg by mouth as needed for erectile dysfunction.      No current facility-administered medications for this visit.     Allergies:   Patient has no known allergies.    Social History:  The patient  reports that he has been smoking Cigarettes.  He started smoking about 45 years ago. He has a 43.00 pack-year smoking history. He has never used smokeless tobacco. He reports that he drinks alcohol. He reports that he does not use drugs.   Family History:  [name] family history includes Arthritis in his mother and sister; Heart disease in his father; Hypertension in his mother; Melanoma in his father; Testicular cancer in his father.    ROS:  Please see the history of present illness.    Review of Systems: Constitutional:  denies fever, chills, diaphoresis, appetite change and fatigue.  HEENT: denies photophobia, eye pain, redness, hearing loss, ear pain, congestion, sore throat, rhinorrhea, sneezing, neck pain, neck stiffness and tinnitus.  Respiratory: denies SOB, DOE, cough, chest tightness, and wheezing.  Cardiovascular: denies chest pain, palpitations and leg swelling.  Gastrointestinal: denies nausea, vomiting, abdominal pain, diarrhea, constipation, blood in stool.  Genitourinary: denies dysuria, urgency, frequency, hematuria, flank pain and difficulty urinating.  Musculoskeletal: denies  myalgias, back pain, joint swelling, arthralgias and gait problem.   Skin: denies pallor, rash and wound.  Neurological: denies dizziness, seizures, syncope, weakness, light-headedness, numbness and headaches.   Hematological: denies adenopathy, easy bruising, personal or family bleeding history.  Psychiatric/ Behavioral: denies suicidal ideation, mood changes, confusion, nervousness, sleep disturbance and agitation.       All other systems are reviewed and negative.    PHYSICAL EXAM: VS:  BP (!) 146/80 (BP Location: Left Arm, Patient Position: Sitting, Cuff Size: Normal)   Pulse 70   Ht  5\' 10"  (1.778 m)   Wt 185 lb 1.9 oz (84 kg)   BMI 26.56 kg/m  , BMI Body mass index is 26.56 kg/m. GEN: Well nourished, well developed, in no acute distress  HEENT: normal  Neck: no JVD, carotid bruits, or masses Cardiac: irreg. Irreg. ; no murmurs, rubs, or gallops,no edema  Respiratory:  clear to auscultation bilaterally, normal work of breathing GI: soft, nontender, nondistended, + BS MS: no deformity or atrophy  Skin: warm and dry, no rash Neuro:  Strength and sensation are intact Psych: normal   EKG:  EKG is not ordered today.   Recent Labs: 07/23/2016: ALT 25; BUN 17; Creat 1.05; Hemoglobin 16.1; Platelets 283; Sodium 140 07/27/2016: Potassium 3.3  Lipid Panel No results found for: CHOL, TRIG, HDL, CHOLHDL, VLDL, LDLCALC, LDLDIRECT    Wt Readings from Last 3 Encounters:  09/03/16 185 lb 1.9 oz (84 kg)  07/27/16 183 lb (83 kg)  07/23/16 188 lb 6.4 oz (85.5 kg)      Other studies Reviewed: Additional studies/ records that were reviewed today include: . Review of the above records demonstrates:    ASSESSMENT AND PLAN:  1.  Atrial fib:    His CHADS2 VASC score is 2 ( CAD, HTN) .  He has been on Eliquis.  Was successfully cardioverted Oct. 6.    He is now back in Atrial fib. Flecainide is not an options due to CAD Amio would not be a great choice due to lung tocicity ( smoker) Will send to A-fib clinic for consideration for Tikosyn vs. Afib ablation .   2. CAD :   Has an occluded LCx with good collaterals.  Continue to follow   3. Essential hypertension:   Blood pressure is   well-controlled.  4. Hyperlipidemia:    Current medicines are reviewed at length with the patient today.  The patient does not have concerns regarding medicines.  The following changes have been made:  no change  Labs/ tests ordered today include:  No orders of the defined types were placed in this encounter.  Disposition:   FU with me in 6  Weeks .     Mertie Moores, MD    09/03/2016 11:48 AM    [address], [place], [place]  [address] Phone: [phone]; Fax: [phone]

## 2016-09-03 NOTE — Patient Instructions (Signed)
Medication Instructions:  Your physician recommends that you continue on your current medications as directed. Please refer to the Current Medication list given to you today.   Labwork: None Ordered   Testing/Procedures: None Ordered   Follow-Up: You have been referred to [place] physician wants you to follow-up in: 6 months with Dr. Acie Fredrickson.  You will receive a reminder letter in the mail two months in advance. If you don't receive a letter, please call our office to schedule the follow-up appointment.   If you need a refill on your cardiac medications before your next appointment, please call your pharmacy.   Thank you for choosing [facility]! Christen Bame, RN [phone]

## 2016-09-14 ENCOUNTER — Ambulatory Visit (HOSPITAL_COMMUNITY)
Admission: RE | Admit: 2016-09-14 | Discharge: 2016-09-14 | Disposition: A | Discharge status: Home / Self Care | Payer: 59 | Source: Ambulatory Visit | Attending: Nurse Practitioner | Admitting: Nurse Practitioner

## 2016-09-14 ENCOUNTER — Encounter (HOSPITAL_COMMUNITY): Payer: Self-pay | Admitting: Nurse Practitioner

## 2016-09-14 VITALS — BP 134/84 | HR 78 | Ht 70.0 in | Wt 186.8 lb

## 2016-09-14 DIAGNOSIS — I119 Hypertensive heart disease without heart failure: Secondary | ICD-10-CM | POA: Diagnosis not present

## 2016-09-14 DIAGNOSIS — E785 Hyperlipidemia, unspecified: Secondary | ICD-10-CM | POA: Diagnosis not present

## 2016-09-14 DIAGNOSIS — I482 Chronic atrial fibrillation, unspecified: Secondary | ICD-10-CM

## 2016-09-14 DIAGNOSIS — I252 Old myocardial infarction: Secondary | ICD-10-CM | POA: Diagnosis not present

## 2016-09-14 DIAGNOSIS — R0609 Other forms of dyspnea: Secondary | ICD-10-CM

## 2016-09-14 DIAGNOSIS — I4819 Other persistent atrial fibrillation: Secondary | ICD-10-CM

## 2016-09-14 DIAGNOSIS — Z79899 Other long term (current) drug therapy: Secondary | ICD-10-CM | POA: Diagnosis not present

## 2016-09-14 DIAGNOSIS — I481 Persistent atrial fibrillation: Secondary | ICD-10-CM | POA: Diagnosis not present

## 2016-09-14 DIAGNOSIS — F17210 Nicotine dependence, cigarettes, uncomplicated: Secondary | ICD-10-CM | POA: Diagnosis not present

## 2016-09-14 DIAGNOSIS — Z9049 Acquired absence of other specified parts of digestive tract: Secondary | ICD-10-CM | POA: Diagnosis not present

## 2016-09-14 DIAGNOSIS — Z8249 Family history of ischemic heart disease and other diseases of the circulatory system: Secondary | ICD-10-CM | POA: Diagnosis not present

## 2016-09-14 DIAGNOSIS — Z8261 Family history of arthritis: Secondary | ICD-10-CM | POA: Diagnosis not present

## 2016-09-14 DIAGNOSIS — I2510 Atherosclerotic heart disease of native coronary artery without angina pectoris: Secondary | ICD-10-CM | POA: Insufficient documentation

## 2016-09-14 DIAGNOSIS — K589 Irritable bowel syndrome without diarrhea: Secondary | ICD-10-CM | POA: Insufficient documentation

## 2016-09-14 DIAGNOSIS — Z9889 Other specified postprocedural states: Secondary | ICD-10-CM | POA: Insufficient documentation

## 2016-09-14 DIAGNOSIS — Z8043 Family history of malignant neoplasm of testis: Secondary | ICD-10-CM | POA: Diagnosis not present

## 2016-09-14 NOTE — Patient Instructions (Signed)
A sleep study will be ordered for you.  You will receive a call from the [facility] office for instruction on this.  You have an echo scheduled for 09/19/2016 at 9:30 a.m. At the [address] office.  Please arrive 10 mins prior to the appointment time  Follow up with [place] in 6 weeks.

## 2016-09-14 NOTE — Progress Notes (Signed)
Primary Care Physician: Shirline Frees, MD Referring Physician: Dr. Renae Gloss Scott [name] is a 65 y.o. male  In the atrial fibrillation clinic for evaluation. He has a h/o CAD, HTN, tobacco abuse,s/p persistent afib since at least July 2016, however pt thinks he may have developed in December of 2015.Marland Kitchen He had cardioversion this past October with ERAF. He does have some fatigue at baseline but really did not feel any different after cardioversion. He is rate controlled even without rate control meds on board. He is on eliquis with CHA2DS2VASc score of at least at least 3.  He does smoke, minimal alcohol, moderate caffeine. He does snore and has not had a sleep study. Echo in July 2017 did show  Normal EF with mod LVH and left atrium enlarged at 49 mm. He has never noticed shortness of breath with activut. No palpitations.   Today, he denies symptoms of palpitations, chest pain, shortness of breath, orthopnea, PND, lower extremity edema, dizziness, presyncope, syncope, or neurologic sequela. Positive for fatigue. The patient is tolerating medications without difficulties and is otherwise without complaint today.   Past Medical History:  Diagnosis Date  . Atrial fibrillation ([place])   . CAD (coronary artery disease)   . HLD (hyperlipidemia)   . HTN (hypertension)   . IBS (irritable bowel syndrome)   . MI, old   . Tobacco abuse    Past Surgical History:  Procedure Laterality Date  . ANTERIOR CRUCIATE LIGAMENT REPAIR    . CARDIAC CATHETERIZATION     [facility]  . CARDIAC CATHETERIZATION N/A 07/27/2016   Procedure: Left Heart Cath and Coronary Angiography;  Surgeon: Sherren Mocha, MD;  Location: [facility];  Service: Cardiovascular;  Laterality: N/A;  . CARDIOVERSION N/A 06/29/2016   Procedure: CARDIOVERSION;  Surgeon: Fay Records, MD;  Location: [facility] ENDOSCOPY;  Service: Cardiovascular;  Laterality: N/A;  . CHOLECYSTECTOMY    . [place], 2000   x 4 times.  .  TONSILLECTOMY      Current Outpatient Prescriptions  Medication Sig Dispense Refill  . amLODipine (NORVASC) 10 MG tablet Take 10 mg by mouth daily.     Marland Kitchen apixaban (ELIQUIS) 5 MG TABS tablet Take 1 tablet (5 mg total) by mouth 2 (two) times daily. 60 tablet 2  . atorvastatin (LIPITOR) 20 MG tablet Take 20 mg by mouth daily.     . fenofibrate (TRICOR) 145 MG tablet Take 145 mg by mouth daily.    Marland Kitchen HYDROcodone-acetaminophen (NORCO/VICODIN) 5-325 MG per tablet Take 2 tablets by mouth every 8 (eight) hours as needed for moderate pain.     Marland Kitchen losartan-hydrochlorothiazide (HYZAAR) 100-25 MG per tablet Take 1 tablet by mouth daily.     . ONE TOUCH ULTRA TEST test strip Use as directed    . potassium chloride (K-DUR) 10 MEQ tablet Take 1 tablet (10 mEq total) by mouth daily. 30 tablet 11  . VIAGRA 100 MG tablet Take 50 mg by mouth as needed for erectile dysfunction.      No current facility-administered medications for this encounter.     No Known Allergies  Social History   Social History  . Marital status: Married    Spouse name: N/A  . Number of children: 2  . Years of education: N/A   Occupational History  . retired    Social History Main Topics  . Smoking status: Current Every Day Smoker    Packs/day: 1.00    Years: 43.00  Types: Cigarettes    Start date: 06/02/1971  . Smokeless tobacco: Never Used  . Alcohol use 0.0 oz/week     Comment: occasionaly  . Drug use: No  . Sexual activity: Not on file   Other Topics Concern  . Not on file   Social History Narrative   He is originally from [place]. He moved to [facility] in 1965. Previously has traveled to most of the [place]. Has traveled to [place]. Has been to [place], [place], & [place]. 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 Remote bird exposure in 1980. No mold exposure.     Family History  Problem Relation Age of Onset  . Heart disease Father   . Melanoma Father   . Testicular cancer  Father   . Hypertension Mother   . Arthritis Mother   . Arthritis Sister   . Lung disease Neg Hx     ROS- All systems are reviewed and negative except as per the HPI above  Physical Exam: Vitals:   09/14/16 1046  BP: 134/84  Pulse: 78  Weight: 186 lb 12.8 oz (84.7 kg)  Height: 5\' 10"  (1.778 m)   Wt Readings from Last 3 Encounters:  09/14/16 186 lb 12.8 oz (84.7 kg)  09/03/16 185 lb 1.9 oz (84 kg)  07/27/16 183 lb (83 kg)    Labs: Lab Results  Component Value Date   NA 140 07/23/2016   K 3.3 (L) 07/27/2016   CL 107 07/23/2016   CO2 27 07/23/2016   GLUCOSE 100 (H) 07/23/2016   BUN 17 07/23/2016   CREATININE 1.05 07/23/2016   CALCIUM 9.3 07/23/2016   Lab Results  Component Value Date   INR 1.2 (H) 07/23/2016   No results found for: CHOL, HDL, LDLCALC, TRIG   GEN- The patient is well appearing, alert and oriented x 3 today.   Head- normocephalic, atraumatic Eyes-  Sclera clear, conjunctiva pink Ears- hearing intact Oropharynx- clear Neck- supple, no JVP Lymph- no cervical lymphadenopathy Lungs- Clear to ausculation bilaterally, normal work of breathing Heart- irregular rate and rhythm, no murmurs, rubs or gallops, PMI not laterally displaced GI- soft, NT, ND, + BS Extremities- no clubbing, cyanosis, or edema MS- no significant deformity or atrophy Skin- no rash or lesion Psych- euthymic mood, full affect Neuro- strength and sensation are intact  EKG-afib at 78 bpm, qrs int 116 ms, qtc 456 ms Epic records reviewed Echo-Study Conclusions 03/2015  - Left ventricle: The cavity size was normal. Wall thickness was   increased in a pattern of moderate LVH. Systolic function was   normal. The estimated ejection fraction was in the range of 55%   to 60%. Indeterminant diastolic function (atrial fibrillation).   Wall motion was normal; there were no regional wall motion   abnormalities. - Aortic valve: There was no stenosis. - Mitral valve: There was trivial  regurgitation. - Left atrium: The atrium was moderately dilated. - Right ventricle: The cavity size was normal. Systolic function   was normal. - Right atrium: The atrium was mildly dilated. - Pulmonary arteries: No complete TR doppler jet so unable to   estimate PA systolic pressure. - Systemic veins: IVC measured 1.9 cm with < 50% respirophasic   variation, suggesting RA pressure 8 mmHg.  Impressions:  - The patient was in atrial fibrillation. Normal LV size with   moderate LV hypertrophy, EF 55-60%. Normal RV size and systolic   function. Biatrial enlargement.    Assessment and  Plan: 1. Persistent  afib since at least 03/2015 Pt is minimally symptomatic and is rate controlled without rate meds on board Continue DOAC Discussed means of restoring SR which may be challenging considering length of time he has been in persistent afib  and left atrial size He was given options of tikosyn/sotalol both of which would entail hospitalization and he is unsure if he would like to do this currently, but will think about it, Flecainide out due to CAD and don't think multaq would be effective, too young for amiodarone Will repeat echo and if shows any decline in EF, this may encourage  him to restore SR He would have to stop HCTZ if tikosyn is chosen He does snore and may have sleep apnea which could be responsible for some of his fatigue, playing into afib Will order sleep study Encouraged to stop smoking  F/u in 6 weeks for further review and discussion  Butch Penny C. , [facility] [address] [place], [place] [address] [phone]

## 2016-09-19 ENCOUNTER — Other Ambulatory Visit (HOSPITAL_COMMUNITY): Payer: 59

## 2016-10-08 ENCOUNTER — Other Ambulatory Visit: Payer: Self-pay | Admitting: *Deleted

## 2016-10-08 MED ORDER — APIXABAN 5 MG PO TABS: 5.0000 mg | ORAL_TABLET | Freq: Two times a day (BID) | ORAL | 5 refills | Status: DC

## 2016-10-24 ENCOUNTER — Encounter (HOSPITAL_COMMUNITY): Payer: Self-pay | Admitting: Nurse Practitioner

## 2016-10-24 ENCOUNTER — Ambulatory Visit (HOSPITAL_COMMUNITY)
Admission: RE | Admit: 2016-10-24 | Discharge: 2016-10-24 | Disposition: A | Discharge status: Home / Self Care | Payer: BLUE CROSS/BLUE SHIELD | Source: Ambulatory Visit | Attending: Nurse Practitioner | Admitting: Nurse Practitioner

## 2016-10-24 VITALS — BP 126/84 | HR 84 | Ht 70.0 in | Wt 186.4 lb

## 2016-10-24 DIAGNOSIS — I481 Persistent atrial fibrillation: Secondary | ICD-10-CM | POA: Diagnosis not present

## 2016-10-24 DIAGNOSIS — Z8261 Family history of arthritis: Secondary | ICD-10-CM | POA: Insufficient documentation

## 2016-10-24 DIAGNOSIS — Z9049 Acquired absence of other specified parts of digestive tract: Secondary | ICD-10-CM | POA: Insufficient documentation

## 2016-10-24 DIAGNOSIS — Z808 Family history of malignant neoplasm of other organs or systems: Secondary | ICD-10-CM | POA: Diagnosis not present

## 2016-10-24 DIAGNOSIS — Z79899 Other long term (current) drug therapy: Secondary | ICD-10-CM | POA: Insufficient documentation

## 2016-10-24 DIAGNOSIS — I482 Chronic atrial fibrillation, unspecified: Secondary | ICD-10-CM

## 2016-10-24 DIAGNOSIS — E785 Hyperlipidemia, unspecified: Secondary | ICD-10-CM | POA: Insufficient documentation

## 2016-10-24 DIAGNOSIS — K589 Irritable bowel syndrome without diarrhea: Secondary | ICD-10-CM | POA: Insufficient documentation

## 2016-10-24 DIAGNOSIS — Z8249 Family history of ischemic heart disease and other diseases of the circulatory system: Secondary | ICD-10-CM | POA: Diagnosis not present

## 2016-10-24 DIAGNOSIS — I2510 Atherosclerotic heart disease of native coronary artery without angina pectoris: Secondary | ICD-10-CM | POA: Diagnosis not present

## 2016-10-24 DIAGNOSIS — F17210 Nicotine dependence, cigarettes, uncomplicated: Secondary | ICD-10-CM | POA: Diagnosis not present

## 2016-10-24 DIAGNOSIS — Z9889 Other specified postprocedural states: Secondary | ICD-10-CM | POA: Diagnosis not present

## 2016-10-24 DIAGNOSIS — I252 Old myocardial infarction: Secondary | ICD-10-CM | POA: Diagnosis not present

## 2016-10-24 DIAGNOSIS — I119 Hypertensive heart disease without heart failure: Secondary | ICD-10-CM | POA: Diagnosis not present

## 2016-10-24 DIAGNOSIS — I4819 Other persistent atrial fibrillation: Secondary | ICD-10-CM

## 2016-10-24 NOTE — Progress Notes (Signed)
Primary Care Physician: Shirline Frees, MD Referring Physician: Dr. Renae Gloss Scott [name] is a 66 y.o. male  In the atrial fibrillation clinic for evaluation. He has a h/o CAD, HTN, tobacco abuse,s/p persistent afib since at least July 2016, however pt thinks he may have developed in December of 2015.Marland Kitchen He had cardioversion this past October with ERAF. He does have some fatigue at baseline but really did not feel any different after cardioversion. He is rate controlled even without rate control meds on board. He is on eliquis with CHA2DS2VASc score of at least at least 3.  He does smoke, minimal alcohol, moderate caffeine. He does snore and has not had a sleep study. Echo in July 2017 did show  Normal EF with mod LVH and left atrium enlarged at 49 mm. He has never noticed shortness of breath with activut. No palpitations.  F/i in afib clinc 1/31. He is getting over the flu. He failed to get his echo done. He will reschedule. However, he feels unless his heart shows structure change from being in afib, he would continue to live in afib sine it really is not bothering him.  Today, he denies symptoms of palpitations, chest pain, shortness of breath, orthopnea, PND, lower extremity edema, dizziness, presyncope, syncope, or neurologic sequela. Positive for fatigue. The patient is tolerating medications without difficulties and is otherwise without complaint today.   Past Medical History:  Diagnosis Date  . Atrial fibrillation ([place])   . CAD (coronary artery disease)   . HLD (hyperlipidemia)   . HTN (hypertension)   . IBS (irritable bowel syndrome)   . MI, old   . Tobacco abuse    Past Surgical History:  Procedure Laterality Date  . ANTERIOR CRUCIATE LIGAMENT REPAIR    . CARDIAC CATHETERIZATION     [facility]  . CARDIAC CATHETERIZATION N/A 07/27/2016   Procedure: Left Heart Cath and Coronary Angiography;  Surgeon: Sherren Mocha, MD;  Location: [facility] CV LAB;  Service: Cardiovascular;   Laterality: N/A;  . CARDIOVERSION N/A 06/29/2016   Procedure: CARDIOVERSION;  Surgeon: Fay Records, MD;  Location: [facility] ENDOSCOPY;  Service: Cardiovascular;  Laterality: N/A;  . CHOLECYSTECTOMY    . [place], 2000   x 4 times.  . TONSILLECTOMY      Current Outpatient Prescriptions  Medication Sig Dispense Refill  . amLODipine (NORVASC) 10 MG tablet Take 10 mg by mouth daily.     Marland Kitchen apixaban (ELIQUIS) 5 MG TABS tablet Take 1 tablet (5 mg total) by mouth 2 (two) times daily. 60 tablet 5  . atorvastatin (LIPITOR) 20 MG tablet Take 20 mg by mouth daily.     . fenofibrate (TRICOR) 145 MG tablet Take 145 mg by mouth daily.    Marland Kitchen HYDROcodone-acetaminophen (NORCO/VICODIN) 5-325 MG per tablet Take 2 tablets by mouth every 8 (eight) hours as needed for moderate pain.     Marland Kitchen losartan-hydrochlorothiazide (HYZAAR) 100-25 MG per tablet Take 1 tablet by mouth daily.     . ONE TOUCH ULTRA TEST test strip Use as directed    . potassium chloride (K-DUR) 10 MEQ tablet Take 1 tablet (10 mEq total) by mouth daily. 30 tablet 11  . VIAGRA 100 MG tablet Take 50 mg by mouth as needed for erectile dysfunction.     Marland Kitchen oseltamivir (TAMIFLU) 75 MG capsule      No current facility-administered medications for this encounter.     No Known Allergies  Social History  Social History  . Marital status: Married    Spouse name: N/A  . Number of children: 2  . Years of education: N/A   Occupational History  . retired    Social History Main Topics  . Smoking status: Current Every Day Smoker    Packs/day: 1.00    Years: 43.00    Types: Cigarettes    Start date: 06/02/1971  . Smokeless tobacco: Never Used  . Alcohol use 0.0 oz/week     Comment: occasionaly  . Drug use: No  . Sexual activity: Not on file   Other Topics Concern  . Not on file   Social History Narrative   He is originally from [place]. He moved to [facility] in [place]. Previously has traveled to most of the [place]. Has traveled to [place]. Has been to [place], [place], & [place]. 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 Remote bird exposure in 1980. No mold exposure.     Family History  Problem Relation Age of Onset  . Heart disease Father   . Melanoma Father   . Testicular cancer Father   . Hypertension Mother   . Arthritis Mother   . Arthritis Sister   . Lung disease Neg Hx     ROS- All systems are reviewed and negative except as per the HPI above  Physical Exam: There were no vitals filed for this visit. Wt Readings from Last 3 Encounters:  09/14/16 186 lb 12.8 oz (84.7 kg)  09/03/16 185 lb 1.9 oz (84 kg)  07/27/16 183 lb (83 kg)    Labs: Lab Results  Component Value Date   NA 140 07/23/2016   K 3.3 (L) 07/27/2016   CL 107 07/23/2016   CO2 27 07/23/2016   GLUCOSE 100 (H) 07/23/2016   BUN 17 07/23/2016   CREATININE 1.05 07/23/2016   CALCIUM 9.3 07/23/2016   Lab Results  Component Value Date   INR 1.2 (H) 07/23/2016   No results found for: CHOL, HDL, LDLCALC, TRIG   GEN- The patient is well appearing, alert and oriented x 3 today.   Head- normocephalic, atraumatic Eyes-  Sclera clear, conjunctiva pink Ears- hearing intact Oropharynx- clear Neck- supple, no JVP Lymph- no cervical lymphadenopathy Lungs- Clear to ausculation bilaterally, normal work of breathing Heart- irregular rate and rhythm, no murmurs, rubs or gallops, PMI not laterally displaced GI- soft, NT, ND, + BS Extremities- no clubbing, cyanosis, or edema MS- no significant deformity or atrophy Skin- no rash or lesion Psych- euthymic mood, full affect Neuro- strength and sensation are intact  EKG-afib at 78 bpm, qrs int 116 ms, qtc 456 ms Epic records reviewed Echo-Study Conclusions 03/2015  - Left ventricle: The cavity size was normal. Wall thickness was   increased in a pattern of moderate LVH. Systolic function was   normal. The estimated ejection fraction was in the range of  55%   to 60%. Indeterminant diastolic function (atrial fibrillation).   Wall motion was normal; there were no regional wall motion   abnormalities. - Aortic valve: There was no stenosis. - Mitral valve: There was trivial regurgitation. - Left atrium: The atrium was moderately dilated. - Right ventricle: The cavity size was normal. Systolic function   was normal. - Right atrium: The atrium was mildly dilated. - Pulmonary arteries: No complete TR doppler jet so unable to   estimate PA systolic pressure. - Systemic veins: IVC measured 1.9 cm with < 50% respirophasic   variation,  suggesting RA pressure 8 mmHg.  Impressions:  - The patient was in atrial fibrillation. Normal LV size with   moderate LV hypertrophy, EF 55-60%. Normal RV size and systolic   function. Biatrial enlargement.    Assessment and Plan: 1. Persistent  afib since at least 03/2015 Pt is minimally symptomatic and is rate controlled without rate meds on board Continue DOAC Discussed means of restoring SR which may be challenging considering length of time he has been in persistent afib  and left atrial size He was given options of tikosyn/sotalol both of which would entail hospitalization and he would prefer not to do this.  Will repeat echo and if shows any decline in EF, this may encourage  him to restore SR He would have to stop HCTZ if tikosyn is chosen He does snore but does not want sleep study at this time. Encouraged to stop smoking  Will notify by phone results of echo and any needed f/u.  Geroge Baseman , [facility] [address] [place], [place] [address] [phone]

## 2016-10-31 ENCOUNTER — Encounter (HOSPITAL_BASED_OUTPATIENT_CLINIC_OR_DEPARTMENT_OTHER): Payer: 59

## 2016-10-31 ENCOUNTER — Ambulatory Visit (HOSPITAL_COMMUNITY)
Admission: RE | Admit: 2016-10-31 | Discharge: 2016-10-31 | Disposition: A | Discharge status: Home / Self Care | Payer: BLUE CROSS/BLUE SHIELD | Source: Ambulatory Visit | Attending: Nurse Practitioner | Admitting: Nurse Practitioner

## 2016-10-31 DIAGNOSIS — I082 Rheumatic disorders of both aortic and tricuspid valves: Secondary | ICD-10-CM | POA: Diagnosis not present

## 2016-10-31 DIAGNOSIS — I481 Persistent atrial fibrillation: Secondary | ICD-10-CM | POA: Insufficient documentation

## 2016-10-31 DIAGNOSIS — I4819 Other persistent atrial fibrillation: Secondary | ICD-10-CM

## 2016-10-31 NOTE — Progress Notes (Signed)
  Echocardiogram 2D Echocardiogram has been performed.  Scott [name] 10/31/2016, 3:44 PM

## 2016-12-05 ENCOUNTER — Other Ambulatory Visit: Payer: Self-pay | Admitting: Family Medicine

## 2016-12-05 DIAGNOSIS — I739 Peripheral vascular disease, unspecified: Secondary | ICD-10-CM

## 2016-12-11 ENCOUNTER — Ambulatory Visit
Admission: RE | Admit: 2016-12-11 | Discharge: 2016-12-11 | Disposition: A | Discharge status: Home / Self Care | Payer: BLUE CROSS/BLUE SHIELD | Source: Ambulatory Visit | Attending: Family Medicine | Admitting: Family Medicine

## 2016-12-11 DIAGNOSIS — I739 Peripheral vascular disease, unspecified: Secondary | ICD-10-CM

## 2016-12-27 ENCOUNTER — Other Ambulatory Visit: Payer: Self-pay

## 2016-12-27 DIAGNOSIS — I739 Peripheral vascular disease, unspecified: Secondary | ICD-10-CM

## 2017-01-10 ENCOUNTER — Ambulatory Visit (INDEPENDENT_AMBULATORY_CARE_PROVIDER_SITE_OTHER): Payer: BLUE CROSS/BLUE SHIELD | Admitting: Vascular Surgery

## 2017-01-10 ENCOUNTER — Encounter: Payer: Self-pay | Admitting: Vascular Surgery

## 2017-01-10 ENCOUNTER — Ambulatory Visit (HOSPITAL_COMMUNITY)
Admission: RE | Admit: 2017-01-10 | Discharge: 2017-01-10 | Disposition: A | Discharge status: Home / Self Care | Payer: BLUE CROSS/BLUE SHIELD | Source: Ambulatory Visit | Attending: Vascular Surgery | Admitting: Vascular Surgery

## 2017-01-10 VITALS — BP 152/93 | HR 59 | Temp 97.1°F | Resp 16 | Ht 70.0 in | Wt 187.0 lb

## 2017-01-10 DIAGNOSIS — I739 Peripheral vascular disease, unspecified: Secondary | ICD-10-CM | POA: Insufficient documentation

## 2017-01-10 LAB — VAS US LOWER EXTREMITY ARTERIAL DUPLEX
RATIBDISTSYS: 22 cm/s
RSFMPSV: -393 cm/s
RSFPPSV: -73 cm/s
RTIBDISTSYS: 36 cm/s
Right super femoral dist sys PSV: -23 cm/s

## 2017-01-10 NOTE — Progress Notes (Signed)
Referring Physician: Shirline Frees, MD  Patient name: Scott [name] MRN: [id number] DOB: 15-Aug-1951 Sex: male  REASON FOR CONSULT: Right leg claudication  HPI: NAHSHON REICH is a 66 y.o. male with a 2 month history of tightness and fatigue in his right calf when walking about 100 yards. He denies any rest pain in the right foot. He denies any nonhealing wounds in the right foot. He has no symptoms in the left leg. He does have a history of atrial fibrillation and is on Eliquis chronically for this. His atherosclerotic risk factors include diabetes hypertension elevated cholesterol. He also currently smokes about a pack cigarettes per day. Greater than 3 minutes today spent regarding smoking cessation counseling. The patient feels severely limited by his walking distance as he is unable to walk his dog anymore and he has had to cut back on several of his activities. He feels this is lifestyle limiting.   Past Medical History:  Diagnosis Date  . Atrial fibrillation ([place])   . CAD (coronary artery disease)   . HLD (hyperlipidemia)   . HTN (hypertension)   . IBS (irritable bowel syndrome)   . MI, old   . Tobacco abuse    Past Surgical History:  Procedure Laterality Date  . ANTERIOR CRUCIATE LIGAMENT REPAIR    . CARDIAC CATHETERIZATION     [facility]  . CARDIAC CATHETERIZATION N/A 07/27/2016   Procedure: Left Heart Cath and Coronary Angiography;  Surgeon: Sherren Mocha, MD;  Location: [place] CV LAB;  Service: Cardiovascular;  Laterality: N/A;  . CARDIOVERSION N/A 06/29/2016   Procedure: CARDIOVERSION;  Surgeon: Fay Records, MD;  Location: [facility] ENDOSCOPY;  Service: Cardiovascular;  Laterality: N/A;  . CHOLECYSTECTOMY    . [place], 2000   x 4 times.  . TONSILLECTOMY      Family History  Problem Relation Age of Onset  . Heart disease Father   . Melanoma Father   . Testicular cancer Father   . Hypertension Mother   . Arthritis Mother   . Arthritis Sister     . Lung disease Neg Hx     SOCIAL HISTORY: Social History   Social History  . Marital status: Married    Spouse name: N/A  . Number of children: 2  . Years of education: N/A   Occupational History  . retired    Social History Main Topics  . Smoking status: Current Every Day Smoker    Packs/day: 1.00    Years: 43.00    Types: Cigarettes    Start date: 06/02/1971  . Smokeless tobacco: Never Used  . Alcohol use 0.0 oz/week     Comment: occasionaly  . Drug use: No  . Sexual activity: Not on file   Other Topics Concern  . Not on file   Social History Narrative   He is originally from [place]. He moved to [facility] in [place]. Previously has traveled to most of the [place]. Has traveled to [place]. Has been to [place], [place], & [place]. 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 Remote bird exposure in 1980. No mold exposure.     No Known Allergies  Current Outpatient Prescriptions  Medication Sig Dispense Refill  . amLODipine (NORVASC) 10 MG tablet Take 10 mg by mouth daily.     Marland Kitchen apixaban (ELIQUIS) 5 MG TABS tablet Take 1 tablet (5 mg total) by mouth 2 (two) times daily. 60 tablet 5  .  atorvastatin (LIPITOR) 20 MG tablet Take 20 mg by mouth daily.     . fenofibrate (TRICOR) 145 MG tablet Take 145 mg by mouth daily.    Marland Kitchen HYDROcodone-acetaminophen (NORCO/VICODIN) 5-325 MG per tablet Take 2 tablets by mouth every 8 (eight) hours as needed for moderate pain.     Marland Kitchen HYOSCYAMINE SULFATE ER PO Take by mouth.    . losartan-hydrochlorothiazide (HYZAAR) 100-25 MG per tablet Take 1 tablet by mouth daily.     . potassium chloride (K-DUR) 10 MEQ tablet Take 1 tablet (10 mEq total) by mouth daily. 30 tablet 11  . VIAGRA 100 MG tablet Take 50 mg by mouth as needed for erectile dysfunction.      No current facility-administered medications for this visit.     ROS:   General:  No weight loss, Fever, chills  HEENT: No recent headaches, no nasal bleeding, no  visual changes, no sore throat  Neurologic: No dizziness, blackouts, seizures. No recent symptoms of stroke or mini- stroke. No recent episodes of slurred speech, or temporary blindness.  Cardiac: No recent episodes of chest pain/pressure, no shortness of breath at rest.  No shortness of breath with exertion.  + history of atrial fibrillation or irregular heartbeat  Vascular: No history of rest pain in feet.  No history of claudication.  No history of non-healing ulcer, No history of DVT   Pulmonary: No home oxygen, no productive cough, no hemoptysis,  No asthma or wheezing  Musculoskeletal:  [ ]  Arthritis, [ ]  Low back pain,  [ ]  Joint pain  Hematologic:No history of hypercoagulable state.  No history of easy bleeding.  No history of anemia  Gastrointestinal: No hematochezia or melena,  No gastroesophageal reflux, no trouble swallowing  Urinary: [ ]  chronic Kidney disease, [ ]  on HD - [ ]  MWF or [ ]  TTHS, [ ]  Burning with urination, [ ]  Frequent urination, [ ]  Difficulty urinating;   Skin: No rashes  Psychological: No history of anxiety,  No history of depression   Physical Examination  Vitals:   01/10/17 1453  BP: (!) 152/93  Pulse: (!) 59  Resp: 16  Temp: 97.1 F (36.2 C)  TempSrc: Oral  SpO2: 97%  Weight: 187 lb (84.8 kg)  Height: 5\' 10"  (1.778 m)    Body mass index is 26.83 kg/m.  General:  Alert and oriented, no acute distress HEENT: Normal Neck: No bruit or JVD Pulmonary: Clear to auscultation bilaterally Cardiac: Regular Rate and Rhythm without murmur Abdomen: Soft, non-tender, non-distended, no mass, no scars Skin: No rashOr ulcer Extremity Pulses:  2+ radial, brachial, femoral, absent right dorsalis pedis, posterior tibial pulses 2+ left dorsalis pedis and posterior tibial pulse Musculoskeletal: No deformity or edema  Neurologic: Upper and lower extremity motor 5/5 and symmetric  DATA:  Patient had bilateral ABIs performed on 12/11/2016. These were 0.7 on  the right 1.17 on the left. He also had a lower extremity arterial duplex exam in our office today. 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 I reviewed and interpreted this study.  ASSESSMENT:  Claudication right leg.  Discussed with the patient today the natural history of claudication with less than 5% lifetime risk of limb loss if he is able to quit smoking. I also discussed with him the possibility of a 30 minute walking program and risk factor modification to improve his symptoms. I discussed with him that 70% of people will improve with this alone. The patient feels that  he has artery attempted his own walking program and is unsatisfied with his current walking distance and wishes and intervention.    PLAN:  Aortogram with bilateral lower extremity runoff possible intervention in the next few weeks. Risks benefits possible complications and procedure details were discussed the patient today including but not limited to bleeding infection vessel injury contrast reaction. He understands and agrees to proceed. We will stop his Eliquis 3 days prior to the procedure. He states that in the past he was not bridged for cardiac catheterization.  Ruta Hinds, MD [facility] Office: [phone] Pager: [phone]

## 2017-01-11 ENCOUNTER — Other Ambulatory Visit: Payer: Self-pay

## 2017-01-25 ENCOUNTER — Ambulatory Visit (HOSPITAL_COMMUNITY)
Admission: RE | Admit: 2017-01-25 | Discharge: 2017-01-25 | Disposition: A | Discharge status: Home / Self Care | Payer: BLUE CROSS/BLUE SHIELD | Source: Ambulatory Visit | Attending: Vascular Surgery | Admitting: Vascular Surgery

## 2017-01-25 ENCOUNTER — Telehealth: Payer: Self-pay | Admitting: Vascular Surgery

## 2017-01-25 ENCOUNTER — Encounter (HOSPITAL_COMMUNITY)
Admission: RE | Disposition: A | Discharge status: Home / Self Care | Payer: Self-pay | Source: Ambulatory Visit | Attending: Vascular Surgery

## 2017-01-25 DIAGNOSIS — I10 Essential (primary) hypertension: Secondary | ICD-10-CM | POA: Insufficient documentation

## 2017-01-25 DIAGNOSIS — F17210 Nicotine dependence, cigarettes, uncomplicated: Secondary | ICD-10-CM | POA: Diagnosis not present

## 2017-01-25 DIAGNOSIS — I4891 Unspecified atrial fibrillation: Secondary | ICD-10-CM | POA: Diagnosis not present

## 2017-01-25 DIAGNOSIS — Z7901 Long term (current) use of anticoagulants: Secondary | ICD-10-CM | POA: Diagnosis not present

## 2017-01-25 DIAGNOSIS — I739 Peripheral vascular disease, unspecified: Secondary | ICD-10-CM | POA: Diagnosis present

## 2017-01-25 DIAGNOSIS — I2510 Atherosclerotic heart disease of native coronary artery without angina pectoris: Secondary | ICD-10-CM | POA: Insufficient documentation

## 2017-01-25 DIAGNOSIS — K589 Irritable bowel syndrome without diarrhea: Secondary | ICD-10-CM | POA: Insufficient documentation

## 2017-01-25 DIAGNOSIS — E785 Hyperlipidemia, unspecified: Secondary | ICD-10-CM | POA: Insufficient documentation

## 2017-01-25 DIAGNOSIS — I252 Old myocardial infarction: Secondary | ICD-10-CM | POA: Insufficient documentation

## 2017-01-25 DIAGNOSIS — I70211 Atherosclerosis of native arteries of extremities with intermittent claudication, right leg: Secondary | ICD-10-CM

## 2017-01-25 HISTORY — PX: ABDOMINAL AORTOGRAM W/LOWER EXTREMITY: CATH118223

## 2017-01-25 HISTORY — PX: PERIPHERAL VASCULAR INTERVENTION: CATH118257

## 2017-01-25 LAB — POCT I-STAT, CHEM 8
BUN: 21 mg/dL — AB (ref 6–20)
CHLORIDE: 104 mmol/L (ref 101–111)
CREATININE: 1.1 mg/dL (ref 0.61–1.24)
Calcium, Ion: 1.13 mmol/L — ABNORMAL LOW (ref 1.15–1.40)
Glucose, Bld: 114 mg/dL — ABNORMAL HIGH (ref 65–99)
HEMATOCRIT: 44 % (ref 39.0–52.0)
Hemoglobin: 15 g/dL (ref 13.0–17.0)
POTASSIUM: 3.2 mmol/L — AB (ref 3.5–5.1)
Sodium: 143 mmol/L (ref 135–145)
TCO2: 26 mmol/L (ref 0–100)

## 2017-01-25 LAB — POCT ACTIVATED CLOTTING TIME
ACTIVATED CLOTTING TIME: 164 s
Activated Clotting Time: 186 seconds
Activated Clotting Time: 219 seconds

## 2017-01-25 SURGERY — ABDOMINAL AORTOGRAM W/LOWER EXTREMITY
Anesthesia: LOCAL | Laterality: Right

## 2017-01-25 MED ORDER — FENTANYL CITRATE (PF) 100 MCG/2ML IJ SOLN
INTRAMUSCULAR | Status: DC | PRN
  Administered 2017-01-25 (×2): 12.5 ug via INTRAVENOUS

## 2017-01-25 MED ORDER — IODIXANOL 320 MG/ML IV SOLN
INTRAVENOUS | Status: DC | PRN
  Administered 2017-01-25: 150 mL via INTRA_ARTERIAL

## 2017-01-25 MED ORDER — LABETALOL HCL 5 MG/ML IV SOLN: 10.0000 mg | INTRAVENOUS | Status: DC | PRN

## 2017-01-25 MED ORDER — CLOPIDOGREL BISULFATE 300 MG PO TABS: 300.0000 mg | ORAL_TABLET | Freq: Once | ORAL | Status: DC

## 2017-01-25 MED ORDER — HEPARIN SODIUM (PORCINE) 1000 UNIT/ML IJ SOLN
INTRAMUSCULAR | Status: AC
  Filled 2017-01-25: qty 1

## 2017-01-25 MED ORDER — MIDAZOLAM HCL 2 MG/2ML IJ SOLN
INTRAMUSCULAR | Status: AC
  Filled 2017-01-25: qty 2

## 2017-01-25 MED ORDER — ASPIRIN EC 81 MG PO TBEC: 324.0000 mg | DELAYED_RELEASE_TABLET | Freq: Every day | ORAL | 2 refills | Status: AC

## 2017-01-25 MED ORDER — METOPROLOL TARTRATE 5 MG/5ML IV SOLN: 2.0000 mg | INTRAVENOUS | Status: DC | PRN

## 2017-01-25 MED ORDER — SODIUM CHLORIDE 0.45 % IV SOLN
INTRAVENOUS | Status: DC
  Administered 2017-01-25: 10:00:00 via INTRAVENOUS

## 2017-01-25 MED ORDER — HYDRALAZINE HCL 20 MG/ML IJ SOLN: 5.0000 mg | INTRAMUSCULAR | Status: DC | PRN

## 2017-01-25 MED ORDER — LIDOCAINE HCL (PF) 1 % IJ SOLN
INTRAMUSCULAR | Status: DC | PRN
Start: 2017-01-25 — End: 2017-01-25
  Administered 2017-01-25: 15 mL via INTRADERMAL

## 2017-01-25 MED ORDER — CLOPIDOGREL BISULFATE 300 MG PO TABS
ORAL_TABLET | ORAL | Status: AC
  Filled 2017-01-25: qty 1

## 2017-01-25 MED ORDER — PHENOL 1.4 % MT LIQD: 1.0000 | OROMUCOSAL | Status: DC | PRN

## 2017-01-25 MED ORDER — LIDOCAINE HCL 1 % IJ SOLN
INTRAMUSCULAR | Status: AC
  Filled 2017-01-25: qty 20

## 2017-01-25 MED ORDER — HEPARIN SODIUM (PORCINE) 1000 UNIT/ML IJ SOLN
INTRAMUSCULAR | Status: DC | PRN
Start: 2017-01-25 — End: 2017-01-25
  Administered 2017-01-25: 9000 [IU] via INTRAVENOUS

## 2017-01-25 MED ORDER — ACETAMINOPHEN 325 MG PO TABS: 325.0000 mg | ORAL_TABLET | ORAL | Status: DC | PRN

## 2017-01-25 MED ORDER — CLOPIDOGREL BISULFATE 75 MG PO TABS: 75.0000 mg | ORAL_TABLET | Freq: Every day | ORAL | Status: DC

## 2017-01-25 MED ORDER — ALUM & MAG HYDROXIDE-SIMETH 200-200-20 MG/5ML PO SUSP: 15.0000 mL | ORAL | Status: DC | PRN

## 2017-01-25 MED ORDER — FENTANYL CITRATE (PF) 100 MCG/2ML IJ SOLN
INTRAMUSCULAR | Status: AC
  Filled 2017-01-25: qty 2

## 2017-01-25 MED ORDER — MORPHINE SULFATE (PF) 10 MG/ML IV SOLN: 2.0000 mg | INTRAVENOUS | Status: DC | PRN

## 2017-01-25 MED ORDER — SODIUM CHLORIDE 0.9 % IV SOLN
INTRAVENOUS | Status: DC
  Administered 2017-01-25: 07:00:00 via INTRAVENOUS

## 2017-01-25 MED ORDER — DOCUSATE SODIUM 100 MG PO CAPS: 100.0000 mg | ORAL_CAPSULE | Freq: Every day | ORAL | Status: DC

## 2017-01-25 MED ORDER — MIDAZOLAM HCL 2 MG/2ML IJ SOLN
INTRAMUSCULAR | Status: DC | PRN
  Administered 2017-01-25: 2 mg via INTRAVENOUS

## 2017-01-25 MED ORDER — HEPARIN (PORCINE) IN NACL 2-0.9 UNIT/ML-% IJ SOLN
INTRAMUSCULAR | Status: AC
  Filled 2017-01-25: qty 1000

## 2017-01-25 MED ORDER — CLOPIDOGREL BISULFATE 300 MG PO TABS
ORAL_TABLET | ORAL | Status: DC | PRN
  Administered 2017-01-25: 300 mg via ORAL

## 2017-01-25 MED ORDER — ONDANSETRON HCL 4 MG/2ML IJ SOLN: 4.0000 mg | Freq: Four times a day (QID) | INTRAMUSCULAR | Status: DC | PRN

## 2017-01-25 MED ORDER — OXYCODONE HCL 5 MG PO TABS: 5.0000 mg | ORAL_TABLET | ORAL | Status: DC | PRN

## 2017-01-25 MED ORDER — ACETAMINOPHEN 325 MG RE SUPP: 325.0000 mg | RECTAL | Status: DC | PRN

## 2017-01-25 MED ORDER — HEPARIN (PORCINE) IN NACL 2-0.9 UNIT/ML-% IJ SOLN
INTRAMUSCULAR | Status: DC | PRN
  Administered 2017-01-25: 1000 mL

## 2017-01-25 MED ORDER — ASPIRIN 325 MG PO TABS: 325.0000 mg | ORAL_TABLET | Freq: Once | ORAL | Status: DC

## 2017-01-25 MED ORDER — GUAIFENESIN-DM 100-10 MG/5ML PO SYRP: 15.0000 mL | ORAL_SOLUTION | ORAL | Status: DC | PRN

## 2017-01-25 MED ORDER — MORPHINE SULFATE (PF) 4 MG/ML IV SOLN
INTRAVENOUS | Status: AC
  Administered 2017-01-25: 2 mg
  Filled 2017-01-25: qty 1

## 2017-01-25 SURGICAL SUPPLY — 24 items
BALLN ARMADA 4X60X135 (BALLOONS) ×3
BALLN ARMADA 7.0X80X135 (BALLOONS) ×3 IMPLANT
BALLOON ARMADA 4X60X135 (BALLOONS) ×2 IMPLANT
CATH CROSS OVER TEMPO 5F (CATHETERS) ×3 IMPLANT
CATH NAVICROSS ANG 65CM (CATHETERS) ×2 IMPLANT
CATH OMNI FLUSH 5F 65CM (CATHETERS) ×3 IMPLANT
CATH QUICKCROSS .018X135CM (MICROCATHETER) ×3 IMPLANT
CATH QUICKCROSS .035X135CM (MICROCATHETER) ×3 IMPLANT
CATH SOFT-VU 4F 65 STRAIGHT (CATHETERS) ×2 IMPLANT
CATH SOFT-VU STRAIGHT 4F 65CM (CATHETERS) ×1
CATHETER NAVICROSS ANG 65CM (CATHETERS) ×3
GUIDEWIRE ANGLED .035X150CM (WIRE) ×3 IMPLANT
GUIDEWIRE ANGLED .035X260CM (WIRE) ×3 IMPLANT
KIT ENCORE 26 ADVANTAGE (KITS) ×3 IMPLANT
KIT PV (KITS) ×3 IMPLANT
SHEATH PINNACLE 5F 10CM (SHEATH) ×3 IMPLANT
SHEATH PINNACLE MP 7F 45CM (SHEATH) ×3 IMPLANT
STENT ABSOLUTE PRO 7X80X135 (Permanent Stent) ×3 IMPLANT
SYR MEDRAD MARK V 150ML (SYRINGE) ×3 IMPLANT
TRANSDUCER W/STOPCOCK (MISCELLANEOUS) ×3 IMPLANT
TRAY PV CATH (CUSTOM PROCEDURE TRAY) ×3 IMPLANT
WIRE AMPLATZ SS-J .035X180CM (WIRE) ×3 IMPLANT
WIRE HI TORQ VERSACORE J 260CM (WIRE) ×3 IMPLANT
WIRE HITORQ VERSACORE ST 145CM (WIRE) ×3 IMPLANT

## 2017-01-25 NOTE — H&P (View-Only) (Signed)
Referring Physician: Shirline Frees, MD  Patient name: Scott [name] MRN: [id number] DOB: [birth year] Sex: male  REASON FOR CONSULT: Right leg claudication  HPI: Scott [name] is a 66 y.o. male with a 2 month history of tightness and fatigue in his right calf when walking about 100 yards. He denies any rest pain in the right foot. He denies any nonhealing wounds in the right foot. He has no symptoms in the left leg. He does have a history of atrial fibrillation and is on Eliquis chronically for this. His atherosclerotic risk factors include diabetes hypertension elevated cholesterol. He also currently smokes about a pack cigarettes per day. Greater than 3 minutes today spent regarding smoking cessation counseling. The patient feels severely limited by his walking distance as he is unable to walk his dog anymore and he has had to cut back on several of his activities. He feels this is lifestyle limiting.   Past Medical History:  Diagnosis Date  . Atrial fibrillation ([place])   . CAD (coronary artery disease)   . HLD (hyperlipidemia)   . HTN (hypertension)   . IBS (irritable bowel syndrome)   . MI, old   . Tobacco abuse    Past Surgical History:  Procedure Laterality Date  . ANTERIOR CRUCIATE LIGAMENT REPAIR    . CARDIAC CATHETERIZATION     [place]  . CARDIAC CATHETERIZATION N/A 07/27/2016   Procedure: Left Heart Cath and Coronary Angiography;  Surgeon: Sherren Mocha, MD;  Location: [facility];  Service: Cardiovascular;  Laterality: N/A;  . CARDIOVERSION N/A 06/29/2016   Procedure: CARDIOVERSION;  Surgeon: Fay Records, MD;  Location: [facility] ENDOSCOPY;  Service: Cardiovascular;  Laterality: N/A;  . CHOLECYSTECTOMY    . [place], 2000   x 4 times.  . TONSILLECTOMY      Family History  Problem Relation Age of Onset  . Heart disease Father   . Melanoma Father   . Testicular cancer Father   . Hypertension Mother   . Arthritis Mother   . Arthritis Sister     . Lung disease Neg Hx     SOCIAL HISTORY: Social History   Social History  . Marital status: Married    Spouse name: N/A  . Number of children: 2  . Years of education: N/A   Occupational History  . retired    Social History Main Topics  . Smoking status: Current Every Day Smoker    Packs/day: 1.00    Years: 43.00    Types: Cigarettes    Start date: 06/02/1971  . Smokeless tobacco: Never Used  . Alcohol use 0.0 oz/week     Comment: occasionaly  . Drug use: No  . Sexual activity: Not on file   Other Topics Concern  . Not on file   Social History Narrative   He is originally from [place]. He moved to [facility] in 1965. Previously has traveled to most of the [place]. Has traveled to [place]. Has been to [place], [place], & [place]. 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 Remote bird exposure in 1980. No mold exposure.     No Known Allergies  Current Outpatient Prescriptions  Medication Sig Dispense Refill  . amLODipine (NORVASC) 10 MG tablet Take 10 mg by mouth daily.     Marland Kitchen apixaban (ELIQUIS) 5 MG TABS tablet Take 1 tablet (5 mg total) by mouth 2 (two) times daily. 60 tablet 5  .  atorvastatin (LIPITOR) 20 MG tablet Take 20 mg by mouth daily.     . fenofibrate (TRICOR) 145 MG tablet Take 145 mg by mouth daily.    Marland Kitchen HYDROcodone-acetaminophen (NORCO/VICODIN) 5-325 MG per tablet Take 2 tablets by mouth every 8 (eight) hours as needed for moderate pain.     Marland Kitchen HYOSCYAMINE SULFATE ER PO Take by mouth.    . losartan-hydrochlorothiazide (HYZAAR) 100-25 MG per tablet Take 1 tablet by mouth daily.     . potassium chloride (K-DUR) 10 MEQ tablet Take 1 tablet (10 mEq total) by mouth daily. 30 tablet 11  . VIAGRA 100 MG tablet Take 50 mg by mouth as needed for erectile dysfunction.      No current facility-administered medications for this visit.     ROS:   General:  No weight loss, Fever, chills  HEENT: No recent headaches, no nasal bleeding, no  visual changes, no sore throat  Neurologic: No dizziness, blackouts, seizures. No recent symptoms of stroke or mini- stroke. No recent episodes of slurred speech, or temporary blindness.  Cardiac: No recent episodes of chest pain/pressure, no shortness of breath at rest.  No shortness of breath with exertion.  + history of atrial fibrillation or irregular heartbeat  Vascular: No history of rest pain in feet.  No history of claudication.  No history of non-healing ulcer, No history of DVT   Pulmonary: No home oxygen, no productive cough, no hemoptysis,  No asthma or wheezing  Musculoskeletal:  [ ]  Arthritis, [ ]  Low back pain,  [ ]  Joint pain  Hematologic:No history of hypercoagulable state.  No history of easy bleeding.  No history of anemia  Gastrointestinal: No hematochezia or melena,  No gastroesophageal reflux, no trouble swallowing  Urinary: [ ]  chronic Kidney disease, [ ]  on HD - [ ]  MWF or [ ]  TTHS, [ ]  Burning with urination, [ ]  Frequent urination, [ ]  Difficulty urinating;   Skin: No rashes  Psychological: No history of anxiety,  No history of depression   Physical Examination  Vitals:   01/10/17 1453  BP: (!) 152/93  Pulse: (!) 59  Resp: 16  Temp: 97.1 F (36.2 C)  TempSrc: Oral  SpO2: 97%  Weight: 187 lb (84.8 kg)  Height: 5\' 10"  (1.778 m)    Body mass index is 26.83 kg/m.  General:  Alert and oriented, no acute distress HEENT: Normal Neck: No bruit or JVD Pulmonary: Clear to auscultation bilaterally Cardiac: Regular Rate and Rhythm without murmur Abdomen: Soft, non-tender, non-distended, no mass, no scars Skin: No rashOr ulcer Extremity Pulses:  2+ radial, brachial, femoral, absent right dorsalis pedis, posterior tibial pulses 2+ left dorsalis pedis and posterior tibial pulse Musculoskeletal: No deformity or edema  Neurologic: Upper and lower extremity motor 5/5 and symmetric  DATA:  Patient had bilateral ABIs performed on 12/11/2016. These were 0.7 on  the right 1.17 on the left. He also had a lower extremity arterial duplex exam in our office today. 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 I reviewed and interpreted this study.  ASSESSMENT:  Claudication right leg.  Discussed with the patient today the natural history of claudication with less than 5% lifetime risk of limb loss if he is able to quit smoking. I also discussed with him the possibility of a 30 minute walking program and risk factor modification to improve his symptoms. I discussed with him that 70% of people will improve with this alone. The patient feels that  he has artery attempted his own walking program and is unsatisfied with his current walking distance and wishes and intervention.    PLAN:  Aortogram with bilateral lower extremity runoff possible intervention in the next few weeks. Risks benefits possible complications and procedure details were discussed the patient today including but not limited to bleeding infection vessel injury contrast reaction. He understands and agrees to proceed. We will stop his Eliquis 3 days prior to the procedure. He states that in the past he was not bridged for cardiac catheterization.  Ruta Hinds, MD [facility] [place] Office: [phone] Pager: [phone]

## 2017-01-25 NOTE — Telephone Encounter (Signed)
Sched labs 02/11/17 at 12:00 and MD 02/21/17 at 9:45. Lm on cell# to inform pt of appts.

## 2017-01-25 NOTE — Interval H&P Note (Signed)
History and Physical Interval Note:  01/25/2017 7:26 AM  Scott [name]  has presented today for surgery, with the diagnosis of pvd with right leg claudication  The various methods of treatment have been discussed with the patient and family. After consideration of risks, benefits and other options for treatment, the patient has consented to  Procedure(s): Abdominal Aortogram w/Lower Extremity (N/A) as a surgical intervention .  [name] history has been reviewed, patient examined, no change in status, stable for surgery.  I have reviewed the patient's chart and labs.  Questions were answered to the patient's satisfaction.     Ruta Hinds

## 2017-01-25 NOTE — Progress Notes (Signed)
Site area: Insurance underwriter Prior to Removal:  Level 0 Pressure Applied For: 100min Manual:  yes  Patient Status During Pull:  A/O Post Pull Site:  Level 0 Post Pull Instructions Given:  Yes and pt understands post instructions Post Pull Pulses Present: 2+ Lt dp/pt   Rt dp/pt 2+ Dressing Applied:  tegaderm and a 4x4 Bedrest begins @ 10:50:00 Comments: Pt leaves cath lab holding area in stable condition. Lt groin is unremarkable and dressing is CDI.

## 2017-01-25 NOTE — Op Note (Addendum)
Procedure: Abdominal aortogram with bilateral lower extremity runoff right superficial femoral artery stent  Preoperative diagnosis: Right leg claudication. Postoperative diagnosis: Same  Anesthesia: Local with IV sedation  Operative findings: #1  4 cm right superficial femoral artery occlusion stented to 0 residual stenosis with a 7 x 80 absolute Pro self expanding stent  Operative details: After obtaining informed consent, the patient taken the PV lab. The patient was placed in supine position the Angio table. Both groins were prepped and draped in usual sterile fashion. Local anesthesia was inserted of the left common femoral artery. Ultrasound was used to identify the left common femoral artery and femoral bifurcation. An introducer needle was then used to cannulate the left common femoral artery under ultrasound guidance. At the time the needle punctured the artery the patient proceeded to have a large Valsalva maneuver and although there was initially blood flow back through the needle and was unable to advance the guidewire. The patient was having some pain in the left groins everything was removed and hemostasis obtained with direct pressure for 3 minutes. I then reattempted to stick the left common femoral artery and this was successful and 035 versacore wire threaded up into the abdominal aorta under fluoroscopic guidance. Next a 5 French sheath was placed over the guidewire the left common femoral artery. This was thoroughly flushed with heparin saline. A 5 French Omni Flush catheter was then advanced over the guidewire and abdominal aortogram was obtained through the Omni Flush catheter in AP projection. Left and right renal arteries are widely patent. The infrarenal abdominal aorta is widely patent. The left and right common external and internal iliac arteries are widely patent. Next Omni Flush catheter was pulled down just above the aortic bifurcation and magnified pelvic view was performed in  AP projection to confirm the above findings.  Next bilateral lower extremity runoff views were performed through the Omni Flush catheter.  In the left lower extremity, the left common femoral profunda femoris and superficial femoral popliteal Angio tibial posterior tibial and peroneal arteries are all widely patent.  In the right lower extremity, the right common femoral profunda femoris is widely patent. The right superficial femoral artery occludes over a segment of about 4 cm near the adductor hiatus. The above and below-knee popliteal artery is widely patent. There is three-vessel runoff to the right foot. The dorsalis pedis artery does occlude for a short segment in the top of the foot.  At this point it was decided to intervene on the right SFA occlusion. The patient did have a very narrow aortic bifurcation. Therefore the Omni Flush catheter was removed over guidewire exchange for a 5 [place] crossover catheter. This is slightly catheterized the right common iliac artery. An 035 angled Glidewire was then advanced down into the right superficial femoral artery. I initially tried to advance the cross her catheter over this but it would not go secondary to calcification at the aortic bifurcation. I then attempted a 4 French straight catheter which also would not advance over the aortic bifurcation. I then switched out to a CT 5 cm quick cross catheter which I was admitted able to advance over the aortic bifurcation down into the proximal right superficial femoral artery. The Glidewire was then exchanged for an 035 Amplatz wire. At this point the fluoroscopy machine became disabled and we had to wait about 20 minutes for the machine to reboot.  The patient was given 9000 units of intravenous heparin. The 5 French sheath was then swapped  out for a 7 [place] Terumo sheath which was advanced up and over the aortic bifurcation into the distal right common femoral artery. ACT was confirmed greater than 200. The  Amplatz wire was advanced deep into the right superficial femoral artery. A 135 length quick cross catheter was then advanced over the Amplatz wire down to the level of the occlusion. An 035 angled Glidewire was then advanced to this level and reflux back on itself and used to cross the lesion. I didn't investigate quick cross over this. Contrast injection prepuberal back within the true lumen. Next the lesion was predilated with a 4 mm x 60 mm angioplasty balloon to 8 atm for 30 seconds. At this point a 7 mm x 80 mm absolute Pro self expanding stent was advanced and centered on the lesion and deployed. This was then postdilated to 7 mm with a 7 x 80 balloon. This was inflated to nominal pressure. The patient did experience some brief discomfort during this portion of the case and a balloon was then deflated. Completion Angio gram showed wide patency of the stent with no evidence of dissection and three-vessel runoff to the right foot. At this point the sheath was pulled back in the left hemipelvis over the guidewire. The guidewire was then removed. The sheath was left in place to be pulled in the holding area. The patient tolerated the procedure well and there were no complications.  Operative management: The patient now has a widely patent right superficial femoral artery which should alleviate his claudication symptoms. The patient is on chronic Eliquis for atrial fibrillation. We will resume this today. He was also given a dose of 300 mg of Plavix on the table post procedure. He will not be discharged on this. He will be discharged on aspirin for 81 mg tablets daily to be administered until 01/28/2017 until his Eliquis is up to speed with full anticoagulation.  Ruta Hinds, MD [facility] [place] Office: [phone] Pager: [phone]

## 2017-01-25 NOTE — Discharge Instructions (Signed)
Angiogram, Care After °This sheet gives you information about how to care for yourself after your procedure. Your health care provider may also give you more specific instructions. If you have problems or questions, contact your health care provider. °What can I expect after the procedure? °After the procedure, it is common to have bruising and tenderness at the catheter insertion area. °Follow these instructions at home: °Insertion site care  °· Follow instructions from your health care provider about how to take care of your insertion site. Make sure you: °¨ Wash your hands with soap and water before you change your bandage (dressing). If soap and water are not available, use hand sanitizer. °¨ Change your dressing as told by your health care provider. °¨ Leave stitches (sutures), skin glue, or adhesive strips in place. These skin closures may need to stay in place for 2 weeks or longer. If adhesive strip edges start to loosen and curl up, you may trim the loose edges. Do not remove adhesive strips completely unless your health care provider tells you to do that. °· Do not take baths, swim, or use a hot tub until your health care provider approves. °· You may shower 24-48 hours after the procedure or as told by your health care provider. °¨ Gently wash the site with plain soap and water. °¨ Pat the area dry with a clean towel. °¨ Do not rub the site. This may cause bleeding. °· Do not apply powder or lotion to the site. Keep the site clean and dry. °· Check your insertion site every day for signs of infection. Check for: °¨ Redness, swelling, or pain. °¨ Fluid or blood. °¨ Warmth. °¨ Pus or a bad smell. °Activity  °· Rest as told by your health care provider, usually for 1-2 days. °· Do not lift anything that is heavier than 10 lbs. (4.5 kg) or as told by your health care provider. °· Do not drive for 24 hours if you were given a medicine to help you relax (sedative). °· Do not drive or use heavy machinery while  taking prescription pain medicine. °General instructions  °· Return to your normal activities as told by your health care provider, usually in about a week. Ask your health care provider what activities are safe for you. °· If the catheter site starts bleeding, lie flat and put pressure on the site. If the bleeding does not stop, get help right away. This is a medical emergency. °· Drink enough fluid to keep your urine clear or pale yellow. This helps flush the contrast dye from your body. °· Take over-the-counter and prescription medicines only as told by your health care provider. °· Keep all follow-up visits as told by your health care provider. This is important. °Contact a health care provider if: °· You have a fever or chills. °· You have redness, swelling, or pain around your insertion site. °· You have fluid or blood coming from your insertion site. °· The insertion site feels warm to the touch. °· You have pus or a bad smell coming from your insertion site. °· You have bruising around the insertion site. °· You notice blood collecting in the tissue around the catheter site (hematoma). The hematoma may be painful to the touch. °Get help right away if: °· You have severe pain at the catheter insertion area. °· The catheter insertion area swells very fast. °· The catheter insertion area is bleeding, and the bleeding does not stop when you hold steady pressure on   the area. °· The area near or just beyond the catheter insertion site becomes pale, cool, tingly, or numb. °These symptoms may represent a serious problem that is an emergency. Do not wait to see if the symptoms will go away. Get medical help right away. Call your local emergency services (911 in the [place] Do not drive yourself to the hospital. °Summary °· After the procedure, it is common to have bruising and tenderness at the catheter insertion area. °· After the procedure, it is important to rest and drink plenty of fluids. °· Do not take baths,  swim, or use a hot tub until your health care provider says it is okay to do so. You may shower 24-48 hours after the procedure or as told by your health care provider. °· If the catheter site starts bleeding, lie flat and put pressure on the site. If the bleeding does not stop, get help right away. This is a medical emergency. °This information is not intended to replace advice given to you by your health care provider. Make sure you discuss any questions you have with your health care provider. °Document Released: 03/29/2005 Document Revised: 08/15/2016 Document Reviewed: 08/15/2016 °Elsevier Interactive Patient Education © 2017 [employer] ° °

## 2017-01-25 NOTE — Progress Notes (Addendum)
On arrival from cath lab holding during hand off report pt states" I feel something warm on my leg" Scott [name] rt now holding pressure on left groin/ re bled. To monitor. Suella Broad rn holding pressure now, Dr Oneida Alar and made him aware of bleeding issue.

## 2017-01-25 NOTE — Telephone Encounter (Signed)
-----   Message from Mena Goes, RN sent at 01/25/2017 10:14 AM EDT ----- Regarding: 2-4 weeks ABI and LE duplex   ----- Message ----- From: Elam Dutch, MD Sent: 01/25/2017   9:47 AM To: [facility] Charge Pool  Procedure: Abdominal aortogram with bilateral lower extremity runoff right superficial femoral artery stent  He needs office visit with duplex and ABI in 2-4 weeks.  Ruta Hinds, MD [facility] [place] Office: [phone] Pager: [phone]

## 2017-01-28 ENCOUNTER — Encounter (HOSPITAL_COMMUNITY): Payer: Self-pay | Admitting: Vascular Surgery

## 2017-02-08 ENCOUNTER — Encounter: Payer: Self-pay | Admitting: Vascular Surgery

## 2017-02-11 ENCOUNTER — Ambulatory Visit (HOSPITAL_COMMUNITY)
Admit: 2017-02-11 | Discharge: 2017-02-11 | Disposition: A | Discharge status: Home / Self Care | Payer: BLUE CROSS/BLUE SHIELD | Attending: Vascular Surgery | Admitting: Vascular Surgery

## 2017-02-11 ENCOUNTER — Ambulatory Visit (INDEPENDENT_AMBULATORY_CARE_PROVIDER_SITE_OTHER)
Admit: 2017-02-11 | Discharge: 2017-02-11 | Disposition: A | Discharge status: Home / Self Care | Payer: BLUE CROSS/BLUE SHIELD | Attending: Vascular Surgery | Admitting: Vascular Surgery

## 2017-02-11 DIAGNOSIS — I739 Peripheral vascular disease, unspecified: Secondary | ICD-10-CM | POA: Diagnosis not present

## 2017-02-11 NOTE — Addendum Note (Signed)
Addended by: Lianne Cure A on: 02/11/2017 09:09 AM   Modules accepted: Orders

## 2017-02-12 ENCOUNTER — Encounter (HOSPITAL_COMMUNITY): Payer: BLUE CROSS/BLUE SHIELD

## 2017-02-12 ENCOUNTER — Encounter: Payer: BLUE CROSS/BLUE SHIELD | Admitting: Vascular Surgery

## 2017-02-21 ENCOUNTER — Ambulatory Visit (INDEPENDENT_AMBULATORY_CARE_PROVIDER_SITE_OTHER): Payer: BLUE CROSS/BLUE SHIELD | Admitting: Vascular Surgery

## 2017-02-21 ENCOUNTER — Encounter: Payer: Self-pay | Admitting: Vascular Surgery

## 2017-02-21 VITALS — BP 146/93 | HR 67 | Temp 96.7°F | Resp 16 | Ht 70.0 in | Wt 192.0 lb

## 2017-02-21 DIAGNOSIS — I739 Peripheral vascular disease, unspecified: Secondary | ICD-10-CM

## 2017-02-21 NOTE — Progress Notes (Signed)
Patient is a 66 year old male who returns for follow-up today. He underwent right superficial femoral artery stenting 01/25/2017. He states that his walking has improved significantly. Unfortunately he continues to smoke. He is trying to quit. He is on Eliquis for atrial fibrillation. He was placed on a short course of Plavix and aspirin periprocedure but is now off of this.  Physical exam:  Vitals:   02/21/17 0956  BP: (!) 146/93  Pulse: 67  Resp: 16  Temp: (!) 96.7 F (35.9 C)  TempSrc: Oral  SpO2: 99%  Weight: 192 lb (87.1 kg)  Height: 5\' 10"  (1.778 m)    Extremities: 2+ posterior tibial pulses bilaterally, absent dorsalis pedis pulse (known dorsalis pedis occlusion right foot chronic), left groin well-healed no ecchymosis no pulsatile mass  Data: Patient had a duplex ultrasound of his right leg today which showed triphasic waveforms through the stent with no restenosis. ABI on the right side has improved from 0.72 to 1.11. Left ABI is 1.13.  Assessment: Doing well status post right superficial femoral artery stenting. The  Plan: Patient will continue to try to quit smoking. He will remain on his Eliquis for his atrial fibrillation. If this is stopped at any point in the future he would need to be put on Plavix and aspirin. He will follow-up with [place] with a duplex of his stent and bilateral ABIs in 6 months time. He will see our nurse practitioner that office visit.  Ruta Hinds, MD [facility] [place] Office: [phone] Pager: [phone]

## 2017-03-01 NOTE — Addendum Note (Signed)
Addended by: Lianne Cure A on: 03/01/2017 02:06 PM   Modules accepted: Orders

## 2017-04-29 ENCOUNTER — Encounter: Payer: Self-pay | Admitting: Cardiovascular Disease

## 2017-05-01 ENCOUNTER — Other Ambulatory Visit: Payer: Self-pay | Admitting: *Deleted

## 2017-05-02 MED ORDER — APIXABAN 5 MG PO TABS
5.0000 mg | ORAL_TABLET | Freq: Two times a day (BID) | ORAL | 5 refills | Status: DC
Start: 2017-05-02 — End: 2017-10-21

## 2017-05-15 ENCOUNTER — Encounter (INDEPENDENT_AMBULATORY_CARE_PROVIDER_SITE_OTHER): Payer: Self-pay

## 2017-05-15 ENCOUNTER — Ambulatory Visit (INDEPENDENT_AMBULATORY_CARE_PROVIDER_SITE_OTHER): Payer: BLUE CROSS/BLUE SHIELD | Admitting: Cardiovascular Disease

## 2017-05-15 ENCOUNTER — Encounter: Payer: Self-pay | Admitting: Cardiovascular Disease

## 2017-05-15 VITALS — BP 148/80 | HR 72 | Ht 70.0 in | Wt 191.4 lb

## 2017-05-15 DIAGNOSIS — I739 Peripheral vascular disease, unspecified: Secondary | ICD-10-CM | POA: Diagnosis not present

## 2017-05-15 DIAGNOSIS — I481 Persistent atrial fibrillation: Secondary | ICD-10-CM | POA: Diagnosis not present

## 2017-05-15 DIAGNOSIS — I4819 Other persistent atrial fibrillation: Secondary | ICD-10-CM

## 2017-05-15 DIAGNOSIS — I2510 Atherosclerotic heart disease of native coronary artery without angina pectoris: Secondary | ICD-10-CM

## 2017-05-15 NOTE — Progress Notes (Signed)
Cardiology Office Note   Date:  05/15/2017   ID:  Scott [name], Scott [name] [birth year], MRN [id number]  PCP:  Shirline Frees, MD  Cardiologist:   Mertie Moores, MD   Chief Complaint  Patient presents with  . Follow-up    atrial fib, CAD   Problem List 1. CAD - s/p Inf. MI 2. Anxiety 3. Hyperlipidemia 4. Essential hypertension 5. Atrial fibrillation-  6. PAD - stenting of R SFA     Scott [name] is a 66 y.o. male who presents for pre-op clearance prior to a colonoscopy.  He is on plavix and the GI team wanted permission to stop.  Feels fine.  No CP. Not breathing was well.  Since last fall, his breathing has not been as good.  Has had lots of bronchial type issues.   Has taked multiple rounds of antibiotics.   Has not been exercising .  Retired last year.    Still smoking .   Has been trying to cut back  Denies any palpitations.    Sept. 15, 2017: [name] is here to follow up for  atrial fib  Is on the Eliquis regularly  Has been diagnosed with borderline DM  Has been seen by Dr. Ashok Cordia for shortness of breath .  Has had progressive shortness of breath . Has knee problems and shoulder issues Has been walking regularly  - walks 2 miles a day   Has not had any CP  No pains similar to his CP prior to stenting    Oct. 30, 2017:  [name] is seen for follow up of his atrial fib .  Still short of breath  The cardioversion may have helped the shortness of breath but is not as good as he would like to be   Dec. 11 , 2017:  [name] seems to be slightly  doing better. He had a cardiac catheterization on November 3. He had a widely patent left anterior descending artery. The right coronary artery stent was widely patent. The left circumflex artery was totally occluded and had right to left collaterals. His left ventricular systolic function was normal with an EF of 55-60%. He had normal left ventricular end-diastolic pressure.  Staying busy with yard work  - getting up leaves .    Still smoking   Wants to consider cardioversion Flecainide is not an option due to CAD Amio is not a good choice due to possible  lung disease.   Aug. 22, 2018:  [name] is seen today for follow-up visit. He had peripheral vascular stenting  in May with Dr. Oneida Alar. Is still smoking  Has had cardioversion for his atrial fib - only last a short time  Has been seen in the Afib clinic - discussed Tikosyn. Did not want to do that.  Has good days and bad days .   Past Medical History:  Diagnosis Date  . Atrial fibrillation ([place])   . CAD (coronary artery disease)   . HLD (hyperlipidemia)   . HTN (hypertension)   . IBS (irritable bowel syndrome)   . MI, old   . Tobacco abuse     Past Surgical History:  Procedure Laterality Date  . ABDOMINAL AORTOGRAM W/LOWER EXTREMITY N/A 01/25/2017   Procedure: Abdominal Aortogram w/Lower Extremity;  Surgeon: Elam Dutch, MD;  Location: [place] [facility];  Service: Cardiovascular;  Laterality: N/A;  . ANTERIOR CRUCIATE LIGAMENT REPAIR    . CARDIAC CATHETERIZATION     [facility]  . CARDIAC CATHETERIZATION N/A 07/27/2016  Procedure: Left Heart Cath and Coronary Angiography;  Surgeon: Sherren Mocha, MD;  Location: [place] [facility];  Service: Cardiovascular;  Laterality: N/A;  . CARDIOVERSION N/A 06/29/2016   Procedure: CARDIOVERSION;  Surgeon: Fay Records, MD;  Location: [facility];  Service: Cardiovascular;  Laterality: N/A;  . CHOLECYSTECTOMY    . [place], 2000   x 4 times.  Marland Kitchen PERIPHERAL VASCULAR INTERVENTION Right 01/25/2017   Procedure: Peripheral Vascular Intervention;  Surgeon: Elam Dutch, MD;  Location: [place] [facility];  Service: Cardiovascular;  Laterality: Right;  . TONSILLECTOMY       Current Outpatient Prescriptions  Medication Sig Dispense Refill  . amLODipine (NORVASC) 10 MG tablet Take 10 mg by mouth daily.     Marland Kitchen apixaban (ELIQUIS) 5 MG TABS tablet Take 1 tablet (5 mg total) by mouth 2 (two)  times daily. 60 tablet 5  . atorvastatin (LIPITOR) 20 MG tablet Take 20 mg by mouth daily.     . Carboxymethylcellul-Glycerin (REFRESH OPTIVE OP) Place 1 drop into both eyes daily.     . fenofibrate (TRICOR) 145 MG tablet Take 145 mg by mouth daily.    Marland Kitchen HYDROcodone-acetaminophen (NORCO/VICODIN) 5-325 MG per tablet Take 1-2 tablets by mouth every 8 (eight) hours as needed for moderate pain.     . hyoscyamine (LEVSIN SL) 0.125 MG SL tablet Place 0.125 mg under the tongue every 6 (six) hours as needed for cramping.    Marland Kitchen losartan-hydrochlorothiazide (HYZAAR) 100-25 MG per tablet Take 1 tablet by mouth daily.     Marland Kitchen ofloxacin (OCUFLOX) 0.3 % ophthalmic solution Instill 1 drop in the affected eye every hour for the first 4-5 hours then use 1 drop in the affected eye twice daily as needed for infection    . potassium chloride (K-DUR) 10 MEQ tablet Take 1 tablet (10 mEq total) by mouth daily. 30 tablet 11  . VIAGRA 100 MG tablet Take 50 mg by mouth as needed for erectile dysfunction.      Current Facility-Administered Medications  Medication Dose Route Frequency Provider Last Rate Last Dose  . clopidogrel (PLAVIX) tablet 300 mg  300 mg Oral Once Elam Dutch, MD        Allergies:   Patient has no known allergies.    Social History:  The patient  reports that he has been smoking Cigarettes.  He started smoking about 45 years ago. He has a 43.00 pack-year smoking history. He has never used smokeless tobacco. He reports that he drinks alcohol. He reports that he does not use drugs.   Family History:  [name] family history includes Arthritis in his mother and sister; Heart disease in his father; Hypertension in his mother; Melanoma in his father; Testicular cancer in his father.    ROS:  Please see the history of present illness.    Review of Systems: Constitutional:  denies fever, chills, diaphoresis, appetite change and fatigue.  HEENT: denies photophobia, eye pain, redness, hearing  loss, ear pain, congestion, sore throat, rhinorrhea, sneezing, neck pain, neck stiffness and tinnitus.  Respiratory: denies SOB, DOE, cough, chest tightness, and wheezing.  Cardiovascular: denies chest pain, palpitations and leg swelling.  Gastrointestinal: denies nausea, vomiting, abdominal pain, diarrhea, constipation, blood in stool.  Genitourinary: denies dysuria, urgency, frequency, hematuria, flank pain and difficulty urinating.  Musculoskeletal: denies  myalgias, back pain, joint swelling, arthralgias and gait problem.   Skin: denies pallor, rash and wound.  Neurological: denies dizziness, seizures, syncope, weakness,  light-headedness, numbness and headaches.   Hematological: denies adenopathy, easy bruising, personal or family bleeding history.  Psychiatric/ Behavioral: denies suicidal ideation, mood changes, confusion, nervousness, sleep disturbance and agitation.       All other systems are reviewed and negative.    PHYSICAL EXAM: VS:  BP (!) 148/80   Pulse 72   Ht 5\' 10"  (1.778 m)   Wt 191 lb 6.4 oz (86.8 kg)   BMI 27.46 kg/m  , BMI Body mass index is 27.46 kg/m. GEN: Well nourished, well developed, in no acute distress  HEENT: normal  Neck: no JVD, carotid bruits, or masses Cardiac: irreg. Irreg. ; no murmurs, rubs, or gallops,no edema  Respiratory:  clear to auscultation bilaterally, normal work of breathing GI: soft, nontender, nondistended, + BS MS: no deformity or atrophy  Skin: warm and dry, no rash Neuro:  Strength and sensation are intact Psych: normal  EKG:  EKG is not ordered today.  Recent Labs: 07/23/2016: ALT 25; Platelets 283 01/25/2017: BUN 21; Creatinine, Ser 1.10; Hemoglobin 15.0; Potassium 3.2; Sodium 143    Lipid Panel No results found for: CHOL, TRIG, HDL, CHOLHDL, VLDL, LDLCALC, LDLDIRECT    Wt Readings from Last 3 Encounters:  05/15/17 191 lb 6.4 oz (86.8 kg)  02/21/17 192 lb (87.1 kg)  01/25/17 188 lb (85.3 kg)      Other studies  Reviewed: Additional studies/ records that were reviewed today include: . Review of the above records demonstrates:    ASSESSMENT AND PLAN:  1.  Atrial fib:    His CHADS2 VASC score is 2 ( CAD, HTN) .  He has been on Eliquis.  Was successfully cardioverted Oct. 6. , 2017  He is now back in Atrial fib. Flecainide is not an options due to CAD Amio would not be a great choice due to lung tocicity ( smoker) .   2. CAD :   Has an occluded LCx with good collaterals.  Continue to follow  Encouraged him to stop smoking  3. Essential hypertension:   Blood pressure is   A little elevated.   Has been eating more salt recently   4. Hyperlipidemia:    Current medicines are reviewed at length with the patient today.  The patient does not have concerns regarding medicines.  The following changes have been made:  no change  Labs/ tests ordered today include:  No orders of the defined types were placed in this encounter.  Disposition:   FU with me in  6 months      Mertie Moores, MD  05/15/2017 10:02 AM    [facility] [address], [place], [place]  [address] Phone: [phone]; Fax: [phone]

## 2017-05-15 NOTE — Patient Instructions (Addendum)
Medication Instructions:  Your physician recommends that you continue on your current medications as directed. Please refer to the Current Medication list given to you today.   Labwork: TODAY - cholesterol, liver panel, basic metabolic panel   Testing/Procedures: None Ordered   Follow-Up: Your physician wants you to follow-up in: 6 months with Dr. [name]. You will receive a reminder letter in the mail two months in advance. If you don't receive a letter, please call our office to schedule the follow-up appointment.   If you need a refill on your cardiac medications before your next appointment, please call your pharmacy.   Thank you for choosing [facility]!  , RN [phone]    

## 2017-08-09 ENCOUNTER — Other Ambulatory Visit: Payer: Self-pay

## 2017-08-09 MED ORDER — POTASSIUM CHLORIDE ER 10 MEQ PO TBCR: 10.0000 meq | EXTENDED_RELEASE_TABLET | Freq: Every day | ORAL | 8 refills | Status: DC

## 2017-08-29 ENCOUNTER — Ambulatory Visit (HOSPITAL_COMMUNITY)
Admission: RE | Admit: 2017-08-29 | Discharge: 2017-08-29 | Disposition: A | Discharge status: Home / Self Care | Payer: BLUE CROSS/BLUE SHIELD | Source: Ambulatory Visit | Attending: Family | Admitting: Family

## 2017-08-29 ENCOUNTER — Ambulatory Visit (INDEPENDENT_AMBULATORY_CARE_PROVIDER_SITE_OTHER)
Admission: RE | Admit: 2017-08-29 | Discharge: 2017-08-29 | Disposition: A | Discharge status: Home / Self Care | Payer: BLUE CROSS/BLUE SHIELD | Source: Ambulatory Visit | Attending: Family | Admitting: Family

## 2017-08-29 ENCOUNTER — Ambulatory Visit (INDEPENDENT_AMBULATORY_CARE_PROVIDER_SITE_OTHER): Payer: BLUE CROSS/BLUE SHIELD | Admitting: Family

## 2017-08-29 ENCOUNTER — Encounter: Payer: Self-pay | Admitting: Family

## 2017-08-29 VITALS — BP 158/92 | HR 60 | Temp 97.7°F | Resp 18 | Wt 191.4 lb

## 2017-08-29 DIAGNOSIS — E785 Hyperlipidemia, unspecified: Secondary | ICD-10-CM | POA: Insufficient documentation

## 2017-08-29 DIAGNOSIS — I739 Peripheral vascular disease, unspecified: Secondary | ICD-10-CM | POA: Insufficient documentation

## 2017-08-29 DIAGNOSIS — F17200 Nicotine dependence, unspecified, uncomplicated: Secondary | ICD-10-CM

## 2017-08-29 DIAGNOSIS — Z95820 Peripheral vascular angioplasty status with implants and grafts: Secondary | ICD-10-CM | POA: Diagnosis not present

## 2017-08-29 DIAGNOSIS — I779 Disorder of arteries and arterioles, unspecified: Secondary | ICD-10-CM | POA: Diagnosis not present

## 2017-08-29 DIAGNOSIS — I10 Essential (primary) hypertension: Secondary | ICD-10-CM | POA: Diagnosis not present

## 2017-08-29 LAB — VAS US LOWER EXTREMITY ARTERIAL DUPLEX
RSFPPSV: 81 cm/s
Right super femoral mid sys PSV: -118 cm/s

## 2017-08-29 NOTE — Progress Notes (Signed)
[facility] [place]   CC: Follow up peripheral artery occlusive disease  History of Present Illness Scott [name] is a 66 y.o. male who is s/p right superficial femoral artery stenting on 01/25/2017 by Dr. Oneida Alar.  He states his right calf claudication has resolved; the more he walks the better his legs feel. He indicates mild to moderate bilateral knee pain, had injuries to knee from playing basketball.   Unfortunately he continues to smoke. He is trying to quit. He is on Eliquis for atrial fibrillation. He was placed on a short course of Plavix and aspirin periprocedure but is now off of this.   Dr. Oneida Alar last evaluated pt on 02-21-17. At that time pt had 2+ posterior tibial pulses bilaterally, absent dorsalis pedis pulse (known dorsalis pedis occlusion right foot chronic. Dr. Oneida Alar advised pt to continue to try to quit smoking. He will remain on his Eliquis for his atrial fibrillation. If this is stopped at any point in the future he would need to be put on Plavix and aspirin. He will follow-up with [place] with a duplex of his stent and bilateral ABIs in 6 months time. He will see our nurse practitioner that office visit.  He denies any hx of stroke or TIA, states he had a mild MI in 1996.   Pt Diabetic: No Pt smoker: smoker  (3/4 ppd, started in 1972)  Pt meds include: Statin :Yes Betablocker: No ASA: No Other anticoagulants/antiplatelets: Eliquis  Past Medical History:  Diagnosis Date  . Atrial fibrillation ([place])   . CAD (coronary artery disease)   . HLD (hyperlipidemia)   . HTN (hypertension)   . IBS (irritable bowel syndrome)   . MI, old   . Tobacco abuse     Social History Social History   Tobacco Use  . Smoking status: Current Every Day Smoker    Packs/day: 0.75    Years: 43.00    Pack years: 32.25    Types: Cigarettes    Start date: 06/02/1971  . Smokeless tobacco: Never Used  Substance Use Topics  . Alcohol use: Yes    Alcohol/week: 0.0  oz    Comment: occasionaly  . Drug use: No    Family History Family History  Problem Relation Age of Onset  . Heart disease Father   . Melanoma Father   . Testicular cancer Father   . Hypertension Mother   . Arthritis Mother   . Arthritis Sister   . Lung disease Neg Hx     Past Surgical History:  Procedure Laterality Date  . ABDOMINAL AORTOGRAM W/LOWER EXTREMITY N/A 01/25/2017   Procedure: Abdominal Aortogram w/Lower Extremity;  Surgeon: Elam Dutch, MD;  Location: [place] [facility];  Service: Cardiovascular;  Laterality: N/A;  . ANTERIOR CRUCIATE LIGAMENT REPAIR    . CARDIAC CATHETERIZATION     [facility]  . CARDIAC CATHETERIZATION N/A 07/27/2016   Procedure: Left Heart Cath and Coronary Angiography;  Surgeon: Sherren Mocha, MD;  Location: [place] [facility];  Service: Cardiovascular;  Laterality: N/A;  . CARDIOVERSION N/A 06/29/2016   Procedure: CARDIOVERSION;  Surgeon: Fay Records, MD;  Location: [facility] ENDOSCOPY;  Service: Cardiovascular;  Laterality: N/A;  . CHOLECYSTECTOMY    . [place], 2000   x 4 times.  Marland Kitchen PERIPHERAL VASCULAR INTERVENTION Right 01/25/2017   Procedure: Peripheral Vascular Intervention;  Surgeon: Elam Dutch, MD;  Location: [place] [facility];  Service: Cardiovascular;  Laterality: Right;  . TONSILLECTOMY  No Known Allergies  Current Outpatient Medications  Medication Sig Dispense Refill  . amLODipine (NORVASC) 10 MG tablet Take 10 mg by mouth daily.     Marland Kitchen apixaban (ELIQUIS) 5 MG TABS tablet Take 1 tablet (5 mg total) by mouth 2 (two) times daily. 60 tablet 5  . atorvastatin (LIPITOR) 20 MG tablet Take 20 mg by mouth daily.     . Carboxymethylcellul-Glycerin (REFRESH OPTIVE OP) Place 1 drop into both eyes daily.     . fenofibrate (TRICOR) 145 MG tablet Take 145 mg by mouth daily.    Marland Kitchen HYDROcodone-acetaminophen (NORCO/VICODIN) 5-325 MG per tablet Take 1-2 tablets by mouth every 8 (eight) hours as needed for moderate pain.      . hyoscyamine (LEVSIN SL) 0.125 MG SL tablet Place 0.125 mg under the tongue every 6 (six) hours as needed for cramping.    Marland Kitchen losartan-hydrochlorothiazide (HYZAAR) 100-25 MG per tablet Take 1 tablet by mouth daily.     Marland Kitchen ofloxacin (OCUFLOX) 0.3 % ophthalmic solution Instill 1 drop in the affected eye every hour for the first 4-5 hours then use 1 drop in the affected eye twice daily as needed for infection    . potassium chloride (K-DUR) 10 MEQ tablet Take 1 tablet (10 mEq total) daily by mouth. 30 tablet 8  . VIAGRA 100 MG tablet Take 50 mg by mouth as needed for erectile dysfunction.      Current Facility-Administered Medications  Medication Dose Route Frequency Provider Last Rate Last Dose  . clopidogrel (PLAVIX) tablet 300 mg  300 mg Oral Once Elam Dutch, MD        ROS: See HPI for pertinent positives and negatives.   Physical Examination  Vitals:   08/29/17 1252 08/29/17 1257  BP: (!) 161/95 (!) 158/92  Pulse: 60   Resp: 18   Temp: 97.7 F (36.5 C)   TempSrc: Oral   SpO2: 100%   Weight: 191 lb 6.4 oz (86.8 kg)    Body mass index is 27.46 kg/m.  General: A&O x 3, WDWN, male. Gait: normal Eyes: PERRLA. Pulmonary: Respirations are non labored, CTAB, good air movement Cardiac: Irregular rhythm, controlled rate, no detected murmur.         Carotid Bruits Right Left   Negative Negative   Radial pulses are 2+ palpable bilaterally   Adominal aortic pulse is not palpable                         VASCULAR EXAM: Extremities without ischemic changes, without Gangrene; without open wounds.                                                                                                          LE Pulses Right Left       FEMORAL  2+ palpable  2+ palpable        POPLITEAL  1+ palpable   1+ palpable       POSTERIOR TIBIAL  2+ palpable   2+ palpable  DORSALIS PEDIS      ANTERIOR TIBIAL 1+ palpable  1+ palpable    Abdomen: soft, NT, no palpable  masses. Skin: no rashes, no ulcers noted. Musculoskeletal: no muscle wasting or atrophy.  Neurologic: A&O X 3; Appropriate Affect ; SENSATION: normal; MOTOR FUNCTION:  moving all extremities equally, motor strength 5/5 throughout. Speech is fluent/normal. CN 2-12 intact. Loquacious.     ASSESSMENT: Scott [name] is a 66 y.o. male who is s/p right superficial femoral artery stenting on 01/25/2017. Right calf claudication has resolved. There are no signs of ischemia in his feet or legs.  He is on Eliquis for atrial fibrillation.   DATA  Right LE Arterial Duplex (08/29/17):  Slight increase of velocity at the stent origin (234 cm/s); otherwise no evidence for restenosis. No significant change compared to exam on 02-11-17.    ABI (Date: 08/29/2017):  R:   ABI: 1.10 (was 1.11 on 02-11-17),   PT: tri  DP: tri  TBI:  0.82  L:   ABI: 1.28 (was 1.13),   PT: tri  DP: tri  TBI: 0.84  Stable and normal bilateral ABI with all triphasic waveforms.    PLAN:  The patient was counseled re smoking cessation and given several free resources re smoking cessation.   Based on the patient's vascular studies and examination, pt will return to clinic in 3 months with ABI's and right LE arterial duplex. I advised him to notify us if he develops concerns re the circulation in his feet or legs.  I discussed in depth with the patient the nature of atherosclerosis, and emphasized the importance of maximal medical management including strict control of blood pressure, blood glucose, and lipid levels, obtaining regular exercise, and cessation of smoking.  The patient is aware that without maximal medical management the underlying atherosclerotic disease process will progress, limiting the benefit of any interventions.  The patient was given information about PAD including signs, symptoms, treatment, what symptoms should prompt the patient to seek immediate medical care, and risk reduction  measures to take.  [name], RN, MSN, FNP-C [facility] Phone: [phone]  Clinic MD: Oneida Alar  08/29/17 1:13 PM

## 2017-08-29 NOTE — Patient Instructions (Signed)
Steps to Quit Smoking Smoking tobacco can be bad for your health. It can also affect almost every organ in your body. Smoking puts you and people around you at risk for many serious long-lasting (chronic) diseases. Quitting smoking is hard, but it is one of the best things that you can do for your health. It is never too late to quit. What are the benefits of quitting smoking? When you quit smoking, you lower your risk for getting serious diseases and conditions. They can include:  Lung cancer or lung disease.  Heart disease.  Stroke.  Heart attack.  Not being able to have children (infertility).  Weak bones (osteoporosis) and broken bones (fractures).  If you have coughing, wheezing, and shortness of breath, those symptoms may get better when you quit. You may also get sick less often. If you are pregnant, quitting smoking can help to lower your chances of having a baby of low birth weight. What can I do to help me quit smoking? Talk with your doctor about what can help you quit smoking. Some things you can do (strategies) include:  Quitting smoking totally, instead of slowly cutting back how much you smoke over a period of time.  Going to in-person counseling. You are more likely to quit if you go to many counseling sessions.  Using resources and support systems, such as: ? Online chats with a counselor. ? Phone quitlines. ? Printed self-help materials. ? Support groups or group counseling. ? Text messaging programs. ? Mobile phone apps or applications.  Taking medicines. Some of these medicines may have nicotine in them. If you are pregnant or breastfeeding, do not take any medicines to quit smoking unless your doctor says it is okay. Talk with your doctor about counseling or other things that can help you.  Talk with your doctor about using more than one strategy at the same time, such as taking medicines while you are also going to in-person counseling. This can help make  quitting easier. What things can I do to make it easier to quit? Quitting smoking might feel very hard at first, but there is a lot that you can do to make it easier. Take these steps:  Talk to your family and friends. Ask them to support and encourage you.  Call phone quitlines, reach out to support groups, or work with a counselor.  Ask people who smoke to not smoke around you.  Avoid places that make you want (trigger) to smoke, such as: ? Bars. ? Parties. ? Smoke-break areas at work.  Spend time with people who do not smoke.  Lower the stress in your life. Stress can make you want to smoke. Try these things to help your stress: ? Getting regular exercise. ? Deep-breathing exercises. ? Yoga. ? Meditating. ? Doing a body scan. To do this, close your eyes, focus on one area of your body at a time from head to toe, and notice which parts of your body are tense. Try to relax the muscles in those areas.  Download or buy apps on your mobile phone or tablet that can help you stick to your quit plan. There are many free apps, such as QuitGuide from the CDC (Centers for Disease Control and Prevention). You can find more support from smokefree.gov and other websites.  This information is not intended to replace advice given to you by your health care provider. Make sure you discuss any questions you have with your health care provider. Document Released: 07/07/2009 Document   Revised: 05/08/2016 Document Reviewed: 01/25/2015 Elsevier Interactive Patient Education  2018 Elsevier Inc.     Peripheral Vascular Disease Peripheral vascular disease (PVD) is a disease of the blood vessels that are not part of your heart and brain. A simple term for PVD is poor circulation. In most cases, PVD narrows the blood vessels that carry blood from your heart to the rest of your body. This can result in a decreased supply of blood to your arms, legs, and internal organs, like your stomach or kidneys.  However, it most often affects a person's lower legs and feet. There are two types of PVD.  Organic PVD. This is the more common type. It is caused by damage to the structure of blood vessels.  Functional PVD. This is caused by conditions that make blood vessels contract and tighten (spasm).  Without treatment, PVD tends to get worse over time. PVD can also lead to acute ischemic limb. This is when an arm or limb suddenly has trouble getting enough blood. This is a medical emergency. Follow these instructions at home:  Take medicines only as told by your doctor.  Do not use any tobacco products, including cigarettes, chewing tobacco, or electronic cigarettes. If you need help quitting, ask your doctor.  Lose weight if you are overweight, and maintain a healthy weight as told by your doctor.  Eat a diet that is low in fat and cholesterol. If you need help, ask your doctor.  Exercise regularly. Ask your doctor for some good activities for you.  Take good care of your feet. ? Wear comfortable shoes that fit well. ? Check your feet often for any cuts or sores. Contact a doctor if:  You have cramps in your legs while walking.  You have leg pain when you are at rest.  You have coldness in a leg or foot.  Your skin changes.  You are unable to get or have an erection (erectile dysfunction).  You have cuts or sores on your feet that are not healing. Get help right away if:  Your arm or leg turns cold and blue.  Your arms or legs become red, warm, swollen, painful, or numb.  You have chest pain or trouble breathing.  You suddenly have weakness in your face, arm, or leg.  You become very confused or you cannot speak.  You suddenly have a very bad headache.  You suddenly cannot see. This information is not intended to replace advice given to you by your health care provider. Make sure you discuss any questions you have with your health care provider. Document Released:  12/05/2009 Document Revised: 02/16/2016 Document Reviewed: 02/18/2014 Elsevier Interactive Patient Education  2017 Elsevier Inc.  

## 2017-09-05 NOTE — Addendum Note (Signed)
Addended by: Lianne Cure A on: 09/05/2017 04:12 PM   Modules accepted: Orders

## 2017-10-21 ENCOUNTER — Telehealth: Payer: Self-pay | Admitting: Cardiovascular Disease

## 2017-10-21 ENCOUNTER — Other Ambulatory Visit: Payer: Self-pay | Admitting: Cardiovascular Disease

## 2017-10-21 DIAGNOSIS — R5383 Other fatigue: Secondary | ICD-10-CM

## 2017-10-21 DIAGNOSIS — I2510 Atherosclerotic heart disease of native coronary artery without angina pectoris: Secondary | ICD-10-CM

## 2017-10-21 NOTE — Telephone Encounter (Addendum)
Patient complaining about having fatigue, lightheadedness, and SOB. Patient stated this has been going on for a while and he was having the same symptoms at his last visit. Patient stated he feels like he has no energy, and he is sleeping more than usual. Patient wanted to talk with Dr. Acie Fredrickson and see if he had any insight on what he could do different. Will forward to Dr. Acie Fredrickson and his nurse for further advisement.

## 2017-10-21 NOTE — Telephone Encounter (Signed)
Patient calling   Patient c/o Palpitations:  High priority if patient c/o lightheadedness, shortness of breath, or chest pain  1) How long have you had palpitations/irregular HR/ Afib? Are you having the symptoms now? 2 years and progressively getting worse// yes  2) Are you currently experiencing lightheadedness, SOB or CP? SOB// lightheadedness  3) Do you have a history of afib (atrial fibrillation) or irregular heart rhythm? yes  4) Have you checked your BP or HR? (document readings if available):   5) Are you experiencing any other symptoms? Real fatigued

## 2017-10-22 NOTE — Telephone Encounter (Signed)
Left message for patient to call back  

## 2017-10-22 NOTE — Telephone Encounter (Signed)
This sounds more like an internal medicine issue. Has he seen his primary MD yet ?  His LV function was normal on his last echo  Is he having any angina

## 2017-10-22 NOTE — Telephone Encounter (Signed)
Spoke with patient who c/o fatigue, SOB, and lethargy over the past few months, worse recently. He denies symptoms of angina. States he notes that SOB occurs more in the morning and he notes that he is sleeping more hours recently than he did in the past. States even with the increased number of hours he is sleeping, he does not feel that he can get the energy to get things done at certain times. States his legs feel weak at times and he feels like doing less activity is starting to impact his overall muscle strength. I asked if he monitors his BP at home and he denies. He states it has been elevated at recent doctor's visits and he admits to eating more sodium in his attempt to decrease carbohydrate intake. He has recently decreased the amount he is smoking but has not quit. I asked about blood in stools and he reports that he is having bowel movements more often and they tend to be more loose. He had a stool test at his PCP visit in September and it was negative. I asked about snoring and he states his wife used to complain that he snored but does not report that he snores now. He states he has some other concerns about back pain related to pain he felt at Christmas. I advised him to call to schedule an appointment with his PCP for these concerns. He is leaving in the next few days in anticipation of a grandchild being born in [place]. I advised that he can come in for lab work at our office today or tomorrow and that Dr. Acie Fredrickson can advise based on the results. I advised he can schedule follow-up for when he returns from [place]. Patient verbalized understanding and agreement with plan and is scheduled for lab tomorrow 1/30. He was thankful for my help.

## 2017-10-22 NOTE — Telephone Encounter (Signed)
His symptoms are fairly non-specific. Will get labs and see if there is an obvious abn. He will likely need to see his PCP

## 2017-10-23 ENCOUNTER — Encounter (INDEPENDENT_AMBULATORY_CARE_PROVIDER_SITE_OTHER): Payer: Self-pay

## 2017-10-23 ENCOUNTER — Other Ambulatory Visit: Payer: BLUE CROSS/BLUE SHIELD | Admitting: *Deleted

## 2017-10-23 DIAGNOSIS — R5383 Other fatigue: Secondary | ICD-10-CM

## 2017-10-23 DIAGNOSIS — I2510 Atherosclerotic heart disease of native coronary artery without angina pectoris: Secondary | ICD-10-CM

## 2017-10-24 ENCOUNTER — Telehealth: Payer: Self-pay | Admitting: Nurse Practitioner

## 2017-10-24 LAB — BASIC METABOLIC PANEL
BUN / CREAT RATIO: 17 (ref 10–24)
BUN: 17 mg/dL (ref 8–27)
CALCIUM: 9.2 mg/dL (ref 8.6–10.2)
CO2: 23 mmol/L (ref 20–29)
CREATININE: 1.02 mg/dL (ref 0.76–1.27)
Chloride: 102 mmol/L (ref 96–106)
GFR calc non Af Amer: 77 mL/min/{1.73_m2} (ref >59)
GFR, EST AFRICAN AMERICAN: 89 mL/min/{1.73_m2} (ref >59)
Glucose: 161 mg/dL — ABNORMAL HIGH (ref 65–99)
Potassium: 3.9 mmol/L (ref 3.5–5.2)
Sodium: 142 mmol/L (ref 134–144)

## 2017-10-24 LAB — CBC WITH DIFFERENTIAL/PLATELET
Basophils Absolute: 0 10*3/uL (ref 0.0–0.2)
Basos: 0 %
EOS (ABSOLUTE): 0.1 10*3/uL (ref 0.0–0.4)
EOS: 1 %
HEMATOCRIT: 48.6 % (ref 37.5–51.0)
Hemoglobin: 16.6 g/dL (ref 13.0–17.7)
IMMATURE GRANS (ABS): 0 10*3/uL (ref 0.0–0.1)
IMMATURE GRANULOCYTES: 0 %
LYMPHS ABS: 2.6 10*3/uL (ref 0.7–3.1)
LYMPHS: 34 %
MCH: 29.8 pg (ref 26.6–33.0)
MCHC: 34.2 g/dL (ref 31.5–35.7)
MCV: 87 fL (ref 79–97)
MONOCYTES: 11 %
Monocytes Absolute: 0.8 10*3/uL (ref 0.1–0.9)
Neutrophils Absolute: 4.1 10*3/uL (ref 1.4–7.0)
Neutrophils: 54 %
PLATELETS: 338 10*3/uL (ref 150–379)
RBC: 5.57 x10E6/uL (ref 4.14–5.80)
RDW: 13.8 % (ref 12.3–15.4)
WBC: 7.6 10*3/uL (ref 3.4–10.8)

## 2017-10-24 LAB — HEPATIC FUNCTION PANEL
ALK PHOS: 61 IU/L (ref 39–117)
ALT: 22 IU/L (ref 0–44)
AST: 22 IU/L (ref 0–40)
Albumin: 4.2 g/dL (ref 3.6–4.8)
BILIRUBIN TOTAL: 0.6 mg/dL (ref 0.0–1.2)
BILIRUBIN, DIRECT: 0.19 mg/dL (ref 0.00–0.40)
Total Protein: 6.5 g/dL (ref 6.0–8.5)

## 2017-10-24 LAB — LIPID PANEL
CHOL/HDL RATIO: 3.9 ratio (ref 0.0–5.0)
CHOLESTEROL TOTAL: 146 mg/dL (ref 100–199)
HDL: 37 mg/dL — AB (ref >39)
LDL Calculated: 88 mg/dL (ref 0–99)
TRIGLYCERIDES: 105 mg/dL (ref 0–149)
VLDL Cholesterol Cal: 21 mg/dL (ref 5–40)

## 2017-10-24 LAB — TSH: TSH: 1.48 u[IU]/mL (ref 0.450–4.500)

## 2017-10-24 MED ORDER — POTASSIUM CHLORIDE CRYS ER 20 MEQ PO TBCR: 20.0000 meq | EXTENDED_RELEASE_TABLET | Freq: Every day | ORAL | 3 refills | Status: DC

## 2017-10-24 NOTE — Telephone Encounter (Signed)
Reviewed lab results and plan of care with patient who verbalized understanding and agreement to increase Kdur to 20 meq daily. He is aware that a new Rx has been sent to [facility] for the increased dose. He will call back after he returns from [facility] for f/u lab work and/or visit with Dr. Acie Fredrickson. He was thankful for the call.

## 2017-10-24 NOTE — Telephone Encounter (Signed)
-----   Message from Thayer Headings, MD sent at 10/24/2017  2:03 PM EST ----- Glucose is elevate  potassium is low Increase Kdur to 20 meq a day ,  This may be contributing to muscle fatigue, Recheck BMP in 2 weeks

## 2017-11-29 ENCOUNTER — Other Ambulatory Visit: Payer: Self-pay | Admitting: Family Medicine

## 2017-11-29 DIAGNOSIS — H539 Unspecified visual disturbance: Secondary | ICD-10-CM

## 2017-12-04 ENCOUNTER — Ambulatory Visit
Admission: RE | Admit: 2017-12-04 | Discharge: 2017-12-04 | Disposition: A | Discharge status: Home / Self Care | Payer: BLUE CROSS/BLUE SHIELD | Source: Ambulatory Visit | Attending: Family Medicine | Admitting: Family Medicine

## 2017-12-04 ENCOUNTER — Inpatient Hospital Stay (HOSPITAL_COMMUNITY)
Admission: EM | Admit: 2017-12-04 | Discharge: 2017-12-07 | DRG: 038 | Disposition: A | Discharge status: Home / Self Care | Payer: BLUE CROSS/BLUE SHIELD | Attending: Internal Medicine | Admitting: Internal Medicine

## 2017-12-04 ENCOUNTER — Emergency Department (HOSPITAL_COMMUNITY): Payer: BLUE CROSS/BLUE SHIELD

## 2017-12-04 ENCOUNTER — Other Ambulatory Visit: Payer: Self-pay

## 2017-12-04 ENCOUNTER — Encounter (HOSPITAL_COMMUNITY): Payer: Self-pay | Admitting: Emergency Medicine

## 2017-12-04 DIAGNOSIS — E876 Hypokalemia: Secondary | ICD-10-CM | POA: Diagnosis present

## 2017-12-04 DIAGNOSIS — E1151 Type 2 diabetes mellitus with diabetic peripheral angiopathy without gangrene: Secondary | ICD-10-CM | POA: Diagnosis present

## 2017-12-04 DIAGNOSIS — Z7901 Long term (current) use of anticoagulants: Secondary | ICD-10-CM

## 2017-12-04 DIAGNOSIS — I252 Old myocardial infarction: Secondary | ICD-10-CM

## 2017-12-04 DIAGNOSIS — E785 Hyperlipidemia, unspecified: Secondary | ICD-10-CM | POA: Diagnosis present

## 2017-12-04 DIAGNOSIS — I481 Persistent atrial fibrillation: Secondary | ICD-10-CM

## 2017-12-04 DIAGNOSIS — I452 Bifascicular block: Secondary | ICD-10-CM | POA: Diagnosis present

## 2017-12-04 DIAGNOSIS — Z01810 Encounter for preprocedural cardiovascular examination: Secondary | ICD-10-CM | POA: Diagnosis not present

## 2017-12-04 DIAGNOSIS — I700 Atherosclerosis of aorta: Secondary | ICD-10-CM | POA: Diagnosis present

## 2017-12-04 DIAGNOSIS — M1990 Unspecified osteoarthritis, unspecified site: Secondary | ICD-10-CM | POA: Diagnosis not present

## 2017-12-04 DIAGNOSIS — I2583 Coronary atherosclerosis due to lipid rich plaque: Secondary | ICD-10-CM | POA: Diagnosis not present

## 2017-12-04 DIAGNOSIS — K589 Irritable bowel syndrome without diarrhea: Secondary | ICD-10-CM | POA: Diagnosis present

## 2017-12-04 DIAGNOSIS — R29700 NIHSS score 0: Secondary | ICD-10-CM | POA: Diagnosis present

## 2017-12-04 DIAGNOSIS — I63231 Cerebral infarction due to unspecified occlusion or stenosis of right carotid arteries: Secondary | ICD-10-CM | POA: Diagnosis present

## 2017-12-04 DIAGNOSIS — D72829 Elevated white blood cell count, unspecified: Secondary | ICD-10-CM | POA: Diagnosis present

## 2017-12-04 DIAGNOSIS — I6521 Occlusion and stenosis of right carotid artery: Secondary | ICD-10-CM | POA: Diagnosis not present

## 2017-12-04 DIAGNOSIS — H53129 Transient visual loss, unspecified eye: Secondary | ICD-10-CM | POA: Diagnosis present

## 2017-12-04 DIAGNOSIS — E7800 Pure hypercholesterolemia, unspecified: Secondary | ICD-10-CM | POA: Diagnosis not present

## 2017-12-04 DIAGNOSIS — I2510 Atherosclerotic heart disease of native coronary artery without angina pectoris: Secondary | ICD-10-CM | POA: Diagnosis present

## 2017-12-04 DIAGNOSIS — H5461 Unqualified visual loss, right eye, normal vision left eye: Secondary | ICD-10-CM | POA: Diagnosis present

## 2017-12-04 DIAGNOSIS — Z9049 Acquired absence of other specified parts of digestive tract: Secondary | ICD-10-CM

## 2017-12-04 DIAGNOSIS — F17210 Nicotine dependence, cigarettes, uncomplicated: Secondary | ICD-10-CM | POA: Diagnosis present

## 2017-12-04 DIAGNOSIS — F17200 Nicotine dependence, unspecified, uncomplicated: Secondary | ICD-10-CM | POA: Diagnosis present

## 2017-12-04 DIAGNOSIS — I10 Essential (primary) hypertension: Secondary | ICD-10-CM | POA: Diagnosis present

## 2017-12-04 DIAGNOSIS — N179 Acute kidney failure, unspecified: Secondary | ICD-10-CM | POA: Diagnosis present

## 2017-12-04 DIAGNOSIS — M170 Bilateral primary osteoarthritis of knee: Secondary | ICD-10-CM | POA: Diagnosis present

## 2017-12-04 DIAGNOSIS — E7849 Other hyperlipidemia: Secondary | ICD-10-CM

## 2017-12-04 DIAGNOSIS — Z8043 Family history of malignant neoplasm of testis: Secondary | ICD-10-CM | POA: Diagnosis not present

## 2017-12-04 DIAGNOSIS — Z716 Tobacco abuse counseling: Secondary | ICD-10-CM

## 2017-12-04 DIAGNOSIS — Z79899 Other long term (current) drug therapy: Secondary | ICD-10-CM

## 2017-12-04 DIAGNOSIS — Z8249 Family history of ischemic heart disease and other diseases of the circulatory system: Secondary | ICD-10-CM

## 2017-12-04 DIAGNOSIS — H539 Unspecified visual disturbance: Secondary | ICD-10-CM

## 2017-12-04 DIAGNOSIS — Z955 Presence of coronary angioplasty implant and graft: Secondary | ICD-10-CM

## 2017-12-04 DIAGNOSIS — Z7902 Long term (current) use of antithrombotics/antiplatelets: Secondary | ICD-10-CM | POA: Diagnosis not present

## 2017-12-04 DIAGNOSIS — Z79891 Long term (current) use of opiate analgesic: Secondary | ICD-10-CM

## 2017-12-04 DIAGNOSIS — Z8261 Family history of arthritis: Secondary | ICD-10-CM

## 2017-12-04 DIAGNOSIS — Z808 Family history of malignant neoplasm of other organs or systems: Secondary | ICD-10-CM

## 2017-12-04 DIAGNOSIS — I482 Chronic atrial fibrillation: Secondary | ICD-10-CM | POA: Diagnosis present

## 2017-12-04 DIAGNOSIS — I4891 Unspecified atrial fibrillation: Secondary | ICD-10-CM | POA: Diagnosis not present

## 2017-12-04 LAB — CBC WITH DIFFERENTIAL/PLATELET
Basophils Absolute: 0 10*3/uL (ref 0.0–0.1)
Basophils Relative: 0 %
EOS PCT: 1 %
Eosinophils Absolute: 0.1 10*3/uL (ref 0.0–0.7)
HCT: 46.1 % (ref 39.0–52.0)
Hemoglobin: 16 g/dL (ref 13.0–17.0)
LYMPHS ABS: 2.1 10*3/uL (ref 0.7–4.0)
Lymphocytes Relative: 31 %
MCH: 30.2 pg (ref 26.0–34.0)
MCHC: 34.7 g/dL (ref 30.0–36.0)
MCV: 87.1 fL (ref 78.0–100.0)
MONO ABS: 0.4 10*3/uL (ref 0.1–1.0)
MONOS PCT: 7 %
Neutro Abs: 4.2 10*3/uL (ref 1.7–7.7)
Neutrophils Relative %: 61 %
PLATELETS: 303 10*3/uL (ref 150–400)
RBC: 5.29 MIL/uL (ref 4.22–5.81)
RDW: 14.1 % (ref 11.5–15.5)
WBC: 6.8 10*3/uL (ref 4.0–10.5)

## 2017-12-04 LAB — BASIC METABOLIC PANEL
Anion gap: 10 (ref 5–15)
BUN: 18 mg/dL (ref 6–20)
CO2: 23 mmol/L (ref 22–32)
CREATININE: 0.99 mg/dL (ref 0.61–1.24)
Calcium: 9.2 mg/dL (ref 8.9–10.3)
Chloride: 104 mmol/L (ref 101–111)
GFR calc Af Amer: >60 mL/min (ref >60)
GLUCOSE: 116 mg/dL — AB (ref 65–99)
Potassium: 3.7 mmol/L (ref 3.5–5.1)
Sodium: 137 mmol/L (ref 135–145)

## 2017-12-04 LAB — TYPE AND SCREEN
ABO/RH(D): O POS
Antibody Screen: NEGATIVE

## 2017-12-04 LAB — PROTIME-INR
INR: 1.15
Prothrombin Time: 14.7 seconds (ref 11.4–15.2)

## 2017-12-04 LAB — ABO/RH: ABO/RH(D): O POS

## 2017-12-04 LAB — APTT: aPTT: 35 seconds (ref 24–36)

## 2017-12-04 MED ORDER — ACETAMINOPHEN 325 MG PO TABS: 650.0000 mg | ORAL_TABLET | ORAL | Status: DC | PRN

## 2017-12-04 MED ORDER — LOSARTAN POTASSIUM-HCTZ 100-25 MG PO TABS: 1.0000 | ORAL_TABLET | Freq: Every day | ORAL | Status: DC

## 2017-12-04 MED ORDER — ATORVASTATIN CALCIUM 20 MG PO TABS
20.0000 mg | ORAL_TABLET | Freq: Every day | ORAL | Status: DC
  Administered 2017-12-05 – 2017-12-07 (×2): 20 mg via ORAL
  Filled 2017-12-04 (×2): qty 1

## 2017-12-04 MED ORDER — STROKE: EARLY STAGES OF RECOVERY BOOK
Freq: Once | Status: AC
  Administered 2017-12-05: 02:00:00
  Filled 2017-12-04: qty 1

## 2017-12-04 MED ORDER — IOPAMIDOL (ISOVUE-370) INJECTION 76%
INTRAVENOUS | Status: AC
  Administered 2017-12-04: 50 mL
  Filled 2017-12-04: qty 50

## 2017-12-04 MED ORDER — ACETAMINOPHEN 160 MG/5ML PO SOLN: 650.0000 mg | ORAL | Status: DC | PRN

## 2017-12-04 MED ORDER — ACETAMINOPHEN 650 MG RE SUPP: 650.0000 mg | RECTAL | Status: DC | PRN

## 2017-12-04 MED ORDER — HYDROCODONE-ACETAMINOPHEN 5-325 MG PO TABS
1.0000 | ORAL_TABLET | Freq: Three times a day (TID) | ORAL | Status: DC | PRN
  Administered 2017-12-06: 1 via ORAL
  Filled 2017-12-04: qty 1

## 2017-12-04 MED ORDER — POTASSIUM CHLORIDE CRYS ER 20 MEQ PO TBCR
20.0000 meq | EXTENDED_RELEASE_TABLET | Freq: Every day | ORAL | Status: DC
  Administered 2017-12-05: 20 meq via ORAL
  Filled 2017-12-04: qty 1

## 2017-12-04 MED ORDER — APIXABAN 5 MG PO TABS
5.0000 mg | ORAL_TABLET | Freq: Two times a day (BID) | ORAL | Status: DC
  Filled 2017-12-04: qty 1

## 2017-12-04 MED ORDER — LOSARTAN POTASSIUM 50 MG PO TABS: 100.0000 mg | ORAL_TABLET | Freq: Every day | ORAL | Status: DC

## 2017-12-04 MED ORDER — AMLODIPINE BESYLATE 10 MG PO TABS: 10.0000 mg | ORAL_TABLET | Freq: Every day | ORAL | Status: DC

## 2017-12-04 MED ORDER — HEPARIN (PORCINE) IN NACL 100-0.45 UNIT/ML-% IJ SOLN
1500.0000 [IU]/h | INTRAMUSCULAR | Status: DC
  Administered 2017-12-04: 1250 [IU]/h via INTRAVENOUS
  Filled 2017-12-04: qty 250

## 2017-12-04 MED ORDER — HYDROCHLOROTHIAZIDE 12.5 MG PO CAPS: 12.5000 mg | ORAL_CAPSULE | Freq: Every day | ORAL | Status: DC

## 2017-12-04 MED ORDER — FENOFIBRATE 160 MG PO TABS
160.0000 mg | ORAL_TABLET | Freq: Every day | ORAL | Status: DC
  Administered 2017-12-05 – 2017-12-07 (×2): 160 mg via ORAL
  Filled 2017-12-04 (×2): qty 1

## 2017-12-04 MED ORDER — IOPAMIDOL (ISOVUE-370) INJECTION 76%
INTRAVENOUS | Status: AC
  Administered 2017-12-05: 50 mL
  Filled 2017-12-04: qty 50

## 2017-12-04 NOTE — H&P (Signed)
History and Physical    Scott [name]:[id number] DOB: [birth year] DOA: 12/04/2017  Referring MD/NP/PA: Tobie Poet, MD (resident) PCP: Shirline Frees, MD  Patient coming from: Home   Chief Complaint: Abnormal carotid ultrasound  I have personally briefly reviewed patient's old medical records in [place]   HPI: Scott [name] is a 67 y.o. male with medical history significant of HTN, HLD, A. fib on Eliquis, CAD, and PVD s/p right superficial femoral artery stenting in 01/2017 by Dr. Oneida Alar; who presents after having abnormal doppler ultrasound of the carotids today which revealed signs of right carotid artery occlusion at [place] earlier today.  Patient reports that for the last several weeks he has had at least 8-10 episodes where he reports right eye vision loss that he states is kind of like a veil coming over his eyes.  Symptoms last 35-40 seconds and then self resolve.  The last such episode occurred with these complaints he reports associated symptoms of  intermittent left hand weakness and/or discoordination intimately dropping things, lip tingling, fatigue, intermittent shortness of breath, and generalized leg and tiredness.  1-2 weeks ago.  He was seen by the ophthalmologist yesterday and noted to have an a relatively normal exam.  The leg fatigue symptoms he reports are not like when he required s 5/tent placement back in 01/2017.  Denies having any leg swelling, calf pain, at this time he reports still smoking three fourths a pack of cigarettes per day on average but he is trying to cut back with a vapor pen.  ED Course: On admission into the emergency department patient was seen to be afebrile, pulse 62-117, respirations 16, blood pressure 155/86-169/96, and O2 saturations 94-99% on room air.  Labs including CBC, BMP, PT, aPTT were unremarkable.  Vascular surgery was consulted and recommended holding Eliquis and starting the patient on heparin in the morning for  possible surgical intervention to tentatively set for 3/15.  Review of Systems  Constitutional: Positive for malaise/fatigue. Negative for chills and fever.  HENT: Negative for congestion and nosebleeds.   Eyes: Negative for double vision and photophobia.       Positive for visual field change in right eye  Respiratory: Positive for shortness of breath. Negative for cough.   Cardiovascular: Negative for chest pain, palpitations and leg swelling.  Gastrointestinal: Negative for abdominal pain, diarrhea, nausea and vomiting.  Genitourinary: Negative for dysuria and frequency.  Musculoskeletal: Positive for joint pain (Complains of knee pain that is chronic).  Skin: Negative for itching and rash.  Neurological: Positive for tingling and weakness. Negative for speech change and loss of consciousness.  Psychiatric/Behavioral: Negative for suicidal ideas. The patient is not nervous/anxious.     Past Medical History:  Diagnosis Date  . Atrial fibrillation ([place])   . CAD (coronary artery disease)   . HLD (hyperlipidemia)   . HTN (hypertension)   . IBS (irritable bowel syndrome)   . MI, old   . Tobacco abuse     Past Surgical History:  Procedure Laterality Date  . ABDOMINAL AORTOGRAM W/LOWER EXTREMITY N/A 01/25/2017   Procedure: Abdominal Aortogram w/Lower Extremity;  Surgeon: Elam Dutch, MD;  Location: [place] [facility];  Service: Cardiovascular;  Laterality: N/A;  . ANTERIOR CRUCIATE LIGAMENT REPAIR    . CARDIAC CATHETERIZATION     [place]  . CARDIAC CATHETERIZATION N/A 07/27/2016   Procedure: Left Heart Cath and Coronary Angiography;  Surgeon: Sherren Mocha, MD;  Location: [place] [facility];  Service:  Cardiovascular;  Laterality: N/A;  . CARDIOVERSION N/A 06/29/2016   Procedure: CARDIOVERSION;  Surgeon: Fay Records, MD;  Location: [facility] ENDOSCOPY;  Service: Cardiovascular;  Laterality: N/A;  . CHOLECYSTECTOMY    . [place], 2000   x 4 times.  Marland Kitchen PERIPHERAL  VASCULAR INTERVENTION Right 01/25/2017   Procedure: Peripheral Vascular Intervention;  Surgeon: Elam Dutch, MD;  Location: [place] [facility];  Service: Cardiovascular;  Laterality: Right;  . TONSILLECTOMY       reports that he has been smoking cigarettes.  He started smoking about 46 years ago. He has a 32.25 pack-year smoking history. he has never used smokeless tobacco. He reports that he drinks alcohol. He reports that he does not use drugs.  No Known Allergies  Family History  Problem Relation Age of Onset  . Heart disease Father   . Melanoma Father   . Testicular cancer Father   . Hypertension Mother   . Arthritis Mother   . Arthritis Sister   . Lung disease Neg Hx     Prior to Admission medications   Medication Sig Start Date End Date Taking? Authorizing Provider  amLODipine (NORVASC) 10 MG tablet Take 10 mg by mouth daily.  03/18/15  Yes [provider]  atorvastatin (LIPITOR) 20 MG tablet Take 20 mg by mouth daily.  03/07/15  Yes [provider]  Carboxymethylcellul-Glycerin (REFRESH OPTIVE OP) Place 1 drop into both eyes every morning.    Yes [provider]  ELIQUIS 5 MG TABS tablet TAKE 1 TABLET(5 MG) BY MOUTH TWICE DAILY Patient taking differently: Take 5 mg by mouth two times a day 10/21/17  Yes [name], Wonda Cheng, MD  fenofibrate (TRICOR) 145 MG tablet Take 145 mg by mouth daily.   Yes [provider]  HYDROcodone-acetaminophen (NORCO/VICODIN) 5-325 MG per tablet Take 1-2 tablets by mouth every 8 (eight) hours as needed (for pain).  01/28/15  Yes [provider]  losartan-hydrochlorothiazide (HYZAAR) 100-25 MG per tablet Take 1 tablet by mouth daily.  01/29/15  Yes [provider]  potassium chloride SA (K-DUR,KLOR-CON) 20 MEQ tablet Take 1 tablet (20 mEq total) by mouth daily. 10/24/17  Yes [name], Wonda Cheng, MD  ofloxacin (OCUFLOX) 0.3 % ophthalmic solution Instill 1 drop in the affected eye every hour for the first 4-5  hours then use 1 drop in the affected eye twice daily as needed for infection 10/15/16   [provider]  VIAGRA 100 MG tablet Take 50 mg by mouth as needed for erectile dysfunction.  03/07/15   [provider]    Physical Exam:  Constitutional: NAD, calm, comfortable Vitals:   12/04/17 1517 12/04/17 1521 12/04/17 1835 12/04/17 1836  BP: (!) 155/86  (!) 169/96   Pulse: 62  (!) 117 65  Resp: 16     Temp: 98.2 F (36.8 C)     TempSrc: Oral     SpO2: 99%  94% 99%  Weight: 84.8 kg (187 lb) 84.8 kg (187 lb)    Height: 5\' 10"  (1.778 m) 5\' 10"  (1.778 m)     Eyes: PERRL, lids and conjunctivae normal ENMT: Mucous membranes are moist. Posterior pharynx clear of any exudate or lesions.  Neck: normal, supple, no masses, no thyromegaly Respiratory: clear to auscultation bilaterally, no wheezing, no crackles. Normal respiratory effort. No accessory muscle use.  Cardiovascular: Irregular irregular, no murmurs / rubs / gallops. No extremity edema. 2+ pedal pulses. No carotid bruits.  Abdomen: no tenderness,  no masses palpated. No hepatosplenomegaly. Bowel sounds positive.  Musculoskeletal: no clubbing / cyanosis. No joint deformity upper and lower extremities. Good ROM, no contractures. Normal muscle tone.  Skin: no rashes, lesions, ulcers. No induration Neurologic: CN 2-12 grossly intact. Sensation intact, DTR normal. Strength 5/5 in all 4.  Psychiatric: Normal judgment and insight. Alert and oriented x 3. Normal mood.     Labs on Admission: I have personally reviewed following labs and imaging studies  CBC: Recent Labs  Lab 12/04/17 1530  WBC 6.8  NEUTROABS 4.2  HGB 16.0  HCT 46.1  MCV 87.1  PLT 676   Basic Metabolic Panel: Recent Labs  Lab 12/04/17 1530  NA 137  K 3.7  CL 104  CO2 23  GLUCOSE 116*  BUN 18  CREATININE 0.99  CALCIUM 9.2   GFR: Estimated Creatinine Clearance: 77.8 mL/min (by C-G formula based on SCr of 0.99 mg/dL). Liver Function Tests: No  results for input(s): AST, ALT, ALKPHOS, BILITOT, PROT, ALBUMIN in the last 168 hours. No results for input(s): LIPASE, AMYLASE in the last 168 hours. No results for input(s): AMMONIA in the last 168 hours. Coagulation Profile: Recent Labs  Lab 12/04/17 1530  INR 1.15   Cardiac Enzymes: No results for input(s): CKTOTAL, CKMB, CKMBINDEX, TROPONINI in the last 168 hours. BNP (last 3 results) No results for input(s): PROBNP in the last 8760 hours. HbA1C: No results for input(s): HGBA1C in the last 72 hours. CBG: No results for input(s): GLUCAP in the last 168 hours. Lipid Profile: No results for input(s): CHOL, HDL, LDLCALC, TRIG, CHOLHDL, LDLDIRECT in the last 72 hours. Thyroid Function Tests: No results for input(s): TSH, T4TOTAL, FREET4, T3FREE, THYROIDAB in the last 72 hours. Anemia Panel: No results for input(s): VITAMINB12, FOLATE, FERRITIN, TIBC, IRON, RETICCTPCT in the last 72 hours. Urine analysis: No results found for: COLORURINE, APPEARANCEUR, LABSPEC, PHURINE, GLUCOSEU, HGBUR, BILIRUBINUR, KETONESUR, PROTEINUR, UROBILINOGEN, NITRITE, LEUKOCYTESUR Sepsis Labs: No results found for this or any previous visit (from the past 240 hour(s)).   Radiological Exams on Admission: US Carotid Bilateral  Result Date: 12/04/2017 CLINICAL DATA:  Right eye visual disturbance. Coronary artery disease. Peripheral vascular disease. Hypertension, previous tobacco abuse. EXAM: BILATERAL CAROTID DUPLEX ULTRASOUND TECHNIQUE: Pearline Cables scale imaging, color Doppler and duplex ultrasound was performed of bilateral carotid and vertebral arteries in the neck. COMPARISON:  None available TECHNIQUE: Quantification of carotid stenosis is based on velocity parameters that correlate the residual internal carotid diameter with NASCET-based stenosis levels, using the diameter of the distal internal carotid lumen as the denominator for stenosis measurement. The following velocity measurements were obtained: PEAK  SYSTOLIC/END DIASTOLIC RIGHT ICA:                     540/239cm/sec CCA:                     19/5KD/TOI SYSTOLIC ICA/CCA RATIO:  5.5 DIASTOLIC ICA/CCA RATIO: 29 ECA:                     132/17cm/sec LEFT ICA:                     97/31cm/sec CCA:                     71/24PY/KDX SYSTOLIC ICA/CCA RATIO:  1.5 DIASTOLIC ICA/CCA RATIO: 2.4 ECA:  130/15cm/sec FINDINGS: RIGHT CAROTID ARTERY: Calcified plaque in the distal common carotid artery and circumferential in the bulb. Plaque extends into the proximal ICA. There is a short segment high-grade stenosis with focal aliasing on color Doppler interrogation. Markedly elevated peak systolic velocities and elevated diastolic velocities just distal to the lesion. Elsewhere normal waveforms and color Doppler signal. RIGHT VERTEBRAL ARTERY:  Normal flow direction and waveform. LEFT CAROTID ARTERY: Partially calcified circumferential plaque in the bulb extending into proximal internal and external carotid arteries. Normal waveforms and color Doppler signal throughout however. LEFT VERTEBRAL ARTERY: Normal flow direction and waveform. IMPRESSION: 1. Critical near-occlusive proximal right ICA short-segment stenosis. Vascular surgical or neurointerventional radiology consultation recommended. 2. Left carotid plaque resulting in less than 50% stenosis. 3.  Antegrade bilateral vertebral arterial flow. These results will be called to the ordering clinician or representative by the Radiologist Assistant, and communication documented in the PACS or zVision Dashboard. Electronically Signed   By: Lucrezia Europe M.D.   On: 12/04/2017 12:32    EKG: Independently reviewed.  Atrial fibrillation with RBBB at 75 bpm  Assessment/Plan Right carotid artery occlusion: Acute as seen on carotid Doppler ultrasound.  Patient reporting intermittent vision changes and left hand weakness. - Admit to a telemetry bed - Neurochecks - Follow-up MRI w/o and CT angiogram of the neck -  Type and screen - Appreciate vascular surgery consultative services, will follow-up for further recommendation  Atrial fibrillation: Chronic.  Patient followed by Dr. Cathie Olden. - Hold Eliquis  - Pharmacy consult for heparin bridging - Please notify patient's cardiologist regarding the preop cardiac clearance  Essential hypertension - Continue amlodipine and losartan- hydrochlorothiazide  Osteoarthritis of knees - Continue hydrocodone as needed  Dyslipidemia - Continue atorvastatin, fenofibrate  Tobacco abuse - Counseled on the need of cessation of tobacco - nicotine patch offered   DVT prophylaxis: heparin per pharmacy Code Status: Full  Family Communication: No family present at bedside. Disposition Plan: To be determined Consults called: Vascular surgery Admission status: Inpatient  Norval Morton MD [facility] Pager [phone]   If 7PM-7AM, please contact night-coverage [email or web address] Password [facility]  12/04/2017, 7:32 PM

## 2017-12-04 NOTE — ED Provider Notes (Signed)
[facility] Provider Note   CSN: [id number] Arrival date & time: 12/04/17  1447     History   Chief Complaint Chief Complaint  Patient presents with  . Neck Pain    HPI Scott [name] is a 67 y.o. male.  The history is provided by the patient and the spouse.  Neurologic Problem  This is a recurrent problem. Episode onset: 55mo ago. The problem occurs every several days. The problem has been gradually worsening. Pertinent negatives include no chest pain, no abdominal pain, no headaches and no shortness of breath.  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  Patient has had multiple episodes of brief loss of vision in the right eye, left hand weakness, worsening fatigue and malaise.  He has been worked up for this outpatient.  Patient sent here for vascular surgery evaluation given the severity of his ICA occlusion on the right and what sounds like recurrent mini strokes.  Patient states he does not currently have any neurological deficits.  Past Medical History:  Diagnosis Date  . Atrial fibrillation ([place])   . CAD (coronary artery disease)   . HLD (hyperlipidemia)   . HTN (hypertension)   . IBS (irritable bowel syndrome)   . MI, old   . Tobacco abuse     Patient Active Problem List   Diagnosis Date Noted  . Internal carotid artery stenosis, right 12/04/2017  . Atrial fibrillation ([place]) 12/16/2015  . Essential hypertension 12/16/2015  . Hyperlipidemia 12/16/2015  . Arthritis 12/16/2015  . Dyspnea on exertion 12/16/2015  . Tobacco use disorder 12/16/2015  . IBS (irritable bowel syndrome) 12/16/2015  . CAD, NATIVE VESSEL 08/15/2010    Past Surgical History:  Procedure Laterality Date  . ABDOMINAL AORTOGRAM W/LOWER EXTREMITY N/A 01/25/2017   Procedure: Abdominal Aortogram w/Lower Extremity;  Surgeon: Elam Dutch, MD;  Location: [facility];  Service: Cardiovascular;  Laterality: N/A;  . ANTERIOR CRUCIATE  LIGAMENT REPAIR    . CARDIAC CATHETERIZATION     [place]  . CARDIAC CATHETERIZATION N/A 07/27/2016   Procedure: Left Heart Cath and Coronary Angiography;  Surgeon: Sherren Mocha, MD;  Location: [facility];  Service: Cardiovascular;  Laterality: N/A;  . CARDIOVERSION N/A 06/29/2016   Procedure: CARDIOVERSION;  Surgeon: Fay Records, MD;  Location: [facility];  Service: Cardiovascular;  Laterality: N/A;  . CHOLECYSTECTOMY    . [place], 2000   x 4 times.  Marland Kitchen PERIPHERAL VASCULAR INTERVENTION Right 01/25/2017   Procedure: Peripheral Vascular Intervention;  Surgeon: Elam Dutch, MD;  Location: [facility];  Service: Cardiovascular;  Laterality: Right;  . TONSILLECTOMY         Home Medications    Prior to Admission medications   Medication Sig Start Date End Date Taking? Authorizing Provider  amLODipine (NORVASC) 10 MG tablet Take 10 mg by mouth daily.  03/18/15  Yes [provider]  atorvastatin (LIPITOR) 20 MG tablet Take 20 mg by mouth daily.  03/07/15  Yes [provider]  Carboxymethylcellul-Glycerin (REFRESH OPTIVE OP) Place 1 drop into both eyes every morning.    Yes [provider]  ELIQUIS 5 MG TABS tablet TAKE 1 TABLET(5 MG) BY MOUTH TWICE DAILY Patient taking differently: Take 5 mg by mouth two times a day 10/21/17  Yes [name], Wonda Cheng, MD  fenofibrate (TRICOR) 145 MG tablet Take 145 mg by mouth daily.   Yes [provider]  HYDROcodone-acetaminophen (NORCO/VICODIN) 5-325 MG per tablet Take 1-2 tablets by mouth every 8 (eight) hours as needed (for pain).  01/28/15  Yes [provider]  losartan-hydrochlorothiazide (HYZAAR) 100-25 MG per tablet Take 1 tablet by mouth daily.  01/29/15  Yes [provider]  ofloxacin (OCUFLOX) 0.3 % ophthalmic solution Instill 1 drop in the affected eye every hour for the first 4-5 hours then use 1 drop in the affected eye twice daily as needed for infection 10/15/16   Yes [provider]  potassium chloride SA (K-DUR,KLOR-CON) 20 MEQ tablet Take 1 tablet (20 mEq total) by mouth daily. 10/24/17  Yes [name], Wonda Cheng, MD  sildenafil (VIAGRA) 100 MG tablet Take 100 mg by mouth daily as needed for erectile dysfunction.   Yes [provider]    Family History Family History  Problem Relation Age of Onset  . Heart disease Father   . Melanoma Father   . Testicular cancer Father   . Hypertension Mother   . Arthritis Mother   . Arthritis Sister   . Lung disease Neg Hx     Social History Social History   Tobacco Use  . Smoking status: Current Every Day Smoker    Packs/day: 0.75    Years: 43.00    Pack years: 32.25    Types: Cigarettes    Start date: 06/02/1971  . Smokeless tobacco: Never Used  Substance Use Topics  . Alcohol use: Yes    Alcohol/week: 0.0 oz    Comment: occasionaly  . Drug use: No     Allergies   Patient has no known allergies.   Review of Systems Review of Systems  Constitutional: Positive for activity change and fatigue. Negative for chills and fever.  HENT: Negative for ear pain and sore throat.   Eyes: Positive for visual disturbance. Negative for pain and redness.  Respiratory: Negative for cough and shortness of breath.   Cardiovascular: Negative for chest pain and palpitations.  Gastrointestinal: Negative for abdominal pain and vomiting.  Genitourinary: Negative for dysuria.  Musculoskeletal: Negative for back pain and neck pain.  Skin: Negative for color change and rash.  Neurological: Positive for dizziness, weakness and numbness. Negative for seizures, syncope, light-headedness and headaches.  Psychiatric/Behavioral: Negative for confusion.  All other systems reviewed and are negative.    Physical Exam Updated Vital Signs BP (!) 159/90 (BP Location: Right Arm)   Pulse (!) 56   Temp (!) 97.5 F (36.4 C) (Oral)   Resp 16   Ht 5\' 10"  (1.778 m)   Wt 87.3 kg (192 lb 8 oz)   SpO2 100%    BMI 27.62 kg/m   Physical Exam  Constitutional: He is oriented to person, place, and time. He appears well-developed and well-nourished.  HENT:  Head: Normocephalic and atraumatic.  Mouth/Throat: Oropharynx is clear and moist.  Eyes: Conjunctivae and EOM are normal. Pupils are equal, round, and reactive to light.  Neck: Neck supple.  Cardiovascular: Normal rate. An irregularly irregular rhythm present.  No murmur heard. Pulmonary/Chest: Effort normal and breath sounds normal. No respiratory distress. He has no wheezes. He has no rales.  Abdominal: Soft. There is no tenderness. There is no guarding.  Musculoskeletal: He exhibits no edema.  Neurological: He is alert and oriented to person, place, and time. He has normal strength. No cranial nerve deficit or sensory deficit. He exhibits normal muscle tone. Coordination (trace dysmetria with L hand ) abnormal. Gait normal. GCS eye subscore is 4. GCS  verbal subscore is 5. GCS motor subscore is 6.  Skin: Skin is warm and dry.  Psychiatric: He has a normal mood and affect.  Nursing note and vitals reviewed.    ED Treatments / Results  Labs (all labs ordered are listed, but only abnormal results are displayed) Labs Reviewed  BASIC METABOLIC PANEL - Abnormal; Notable for the following components:      Result Value   Glucose, Bld 116 (*)    All other components within normal limits  CBC WITH DIFFERENTIAL/PLATELET  PROTIME-INR  APTT  CBC WITH DIFFERENTIAL/PLATELET  BASIC METABOLIC PANEL  HEPARIN LEVEL (UNFRACTIONATED)  CBC  TYPE AND SCREEN  ABO/RH    EKG  EKG Interpretation  Date/Time:  Wednesday December 04 2017 22:11:11 EDT Ventricular Rate:  75 PR Interval:    QRS Duration: 121 QT Interval:  375 QTC Calculation: 419 R Axis:   -32 Text Interpretation:  Atrial fibrillation Right bundle branch block Nonspecific T abnormalities, lateral leads Baseline wander in lead(s) V1 When comapred to prior, similar aflutter vs fib.  No STEMI  Confirmed by Antony Blackbird [phone]) on 12/04/2017 10:21:13 PM       Radiology Ct Angio Head W Or Wo Contrast  Result Date: 12/04/2017 CLINICAL DATA:  RIGHT-sided upper extremity weakness and visual changes for 1 month. Subtotal RIGHT ICA occlusion. EXAM: CT ANGIOGRAPHY HEAD TECHNIQUE: Multidetector CT imaging of the head was performed using the standard protocol during bolus administration of intravenous contrast. Multiplanar CT image reconstructions and MIPs were obtained to evaluate the vascular anatomy. CONTRAST:  75mL ISOVUE-370 IOPAMIDOL (ISOVUE-370) INJECTION 76% COMPARISON:  BILATERAL carotid duplex ultrasound earlier today FINDINGS: CT HEAD Brain: No evidence of acute infarction, hemorrhage, hydrocephalus, extra-axial collection or mass lesion/mass effect. Normal for age cerebral volume. No definite white matter disease. Tiny perivascular space versus chronic lacunar infarct, RIGHT posterior frontal subcortical white matter Vascular: No hyperdense vessel or unexpected calcification. Skull: Normal. Negative for fracture or focal lesion. Sinuses: No acute findings. Orbits: Unremarkable. CTA HEAD Anterior circulation: No significant stenosis, proximal occlusion, aneurysm, or vascular malformation. Calcific change affects the BILATERAL upper cervical, petrous, cavernous, and supraclinoid internal carotid arteries, without significant narrowing. Posterior circulation: No significant stenosis, proximal occlusion, aneurysm, or vascular malformation. RIGHT vertebral dominant, with LEFT vertebral primarily supplying the posterior inferior cerebellar artery. Distal vertebral calcifications RIGHT V4 segment, nonstenotic. Venous sinuses: As permitted by contrast timing, patent. Anatomic variants: Fetal origin LEFT PCA.  Hypoplastic RIGHT A1 ACA Delayed phase: No abnormal intracranial enhancement. IMPRESSION: Calcification of the cavernous internal carotid arteries and distal RIGHT vertebral artery consistent with  cerebrovascular atherosclerotic disease. No signs of intracranial large vessel occlusion. No findings to suggest acute intracranial ischemia. No abnormal postcontrast enhancement. Electronically Signed   By: Staci Righter M.D.   On: 12/04/2017 20:13   Ct Angio Neck W And/or Wo Contrast  Result Date: 12/05/2017 CLINICAL DATA:  Right carotid stenosis. Scheduled for vascular surgery in 2 days. EXAM: CT ANGIOGRAPHY NECK TECHNIQUE: Multidetector CT imaging of the neck was performed using the standard protocol during bolus administration of intravenous contrast. Multiplanar CT image reconstructions and MIPs were obtained to evaluate the vascular anatomy. Carotid stenosis measurements (when applicable) are obtained utilizing NASCET criteria, using the distal internal carotid diameter as the denominator. CONTRAST:  31mL ISOVUE-370 IOPAMIDOL (ISOVUE-370) INJECTION 76% COMPARISON:  Brain MRI 12/04/2017 CTA head 12/04/2017 FINDINGS: Aortic arch: There is mild calcific atherosclerosis of the aortic arch. There is no aneurysm, dissection or hemodynamically significant stenosis of the visualized  ascending aorta and aortic arch. Conventional 3 vessel aortic branching pattern. The visualized proximal subclavian arteries are normal. Right carotid system: The right common carotid origin is widely patent. There is no common carotid or internal carotid artery dissection or aneurysm. Predominantly calcified atherosclerotic plaque at the right carotid bifurcation causes severe stenosis, measuring at least 90% by NASCET criteria. The distal right internal carotid artery is patent. Left carotid system: The left common carotid origin is widely patent. There is no common carotid or internal carotid artery dissection or aneurysm. There is mixed calcified and noncalcified plaque at the left carotid bifurcation without hemodynamically significant stenosis by NASCET criteria. Vertebral arteries: The vertebral system is right dominant. Left  vertebral artery origin is narrowed by atherosclerotic calcification. There is moderate narrowing of the V2 segment of the right vertebral artery at the level of the C3 vertebral body. There is severe stenosis of the right vertebral artery V3 segment (series 10, image 53). There is mild atherosclerotic calcification of the right V4 segment. Skeleton: There is no bony spinal canal stenosis. No lytic or blastic lesions. Other neck: The nasopharynx is clear. The oropharynx and hypopharynx are normal. The epiglottis is normal. The supraglottic larynx, glottis and subglottic larynx are normal. No retropharyngeal collection. The parapharyngeal spaces are preserved. The parotid and submandibular glands are normal. No sialolithiasis or salivary ductal dilatation. The thyroid gland is normal. There is no cervical lymphadenopathy. Upper chest: No pneumothorax or pleural effusion. No nodules or masses. Review of the MIP images confirms the above findings IMPRESSION: 1. 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 Distal right ICA is patent. 2. 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 3. Left carotid bifurcation atherosclerotic disease without hemodynamically significant stenosis by NASCET criteria. 4.  Aortic Atherosclerosis (ICD10-I70.0). Electronically Signed   By: Ulyses Jarred M.D.   On: 12/05/2017 00:25   Mr Brain Wo Contrast  Result Date: 12/04/2017 CLINICAL DATA:  67 y/o M; 3 1 history of right-sided vision changes and left upper extremity weakness/discoordination. EXAM: MRI HEAD WITHOUT CONTRAST TECHNIQUE: Multiplanar, multiecho pulse sequences of the brain and surrounding structures were obtained without intravenous contrast. COMPARISON:  12/04/2017 CT of the head. FINDINGS: Brain: Several scattered punctate foci of diffusion hyperintensity are present throughout the  right frontal and parietal lobes. Lesions demonstrate variable diffusion on ADC. Foci of infarction demonstrate associated T2 FLAIR hyperintense signal abnormality. Cystic change of a single right posterior frontal lesion without susceptibility blooming (series 8, image 76). No susceptibility abnormality on the SWAN sequence to indicate intracranial hemorrhage. No extra-axial collection, hydrocephalus, or effacement of basilar cisterns. Vascular: Normal flow voids. Skull and upper cervical spine: Normal marrow signal. Sinuses/Orbits: Negative. Other: None. IMPRESSION: Several punctate foci of infarction in the right frontal and parietal lobes of mixed age including recent acute/early subacute infarctions. No hemorrhage or mass effect. Electronically Signed   By: Kristine Garbe M.D.   On: 12/04/2017 21:30   US Carotid Bilateral  Result Date: 12/04/2017 CLINICAL DATA:  Right eye visual disturbance. Coronary artery disease. Peripheral vascular disease. Hypertension, previous tobacco abuse. EXAM: BILATERAL CAROTID DUPLEX ULTRASOUND TECHNIQUE: Pearline Cables scale imaging, color Doppler and duplex ultrasound was performed of bilateral carotid and vertebral arteries in the neck. COMPARISON:  None available TECHNIQUE: Quantification of carotid stenosis is based on velocity parameters that correlate the residual internal carotid diameter with NASCET-based stenosis levels, using the diameter of  the distal internal carotid lumen as the denominator for stenosis measurement. The following velocity measurements were obtained: PEAK SYSTOLIC/END DIASTOLIC RIGHT ICA:                     540/239cm/sec CCA:                     61/6WV/PXT SYSTOLIC ICA/CCA RATIO:  5.5 DIASTOLIC ICA/CCA RATIO: 29 ECA:                     132/17cm/sec LEFT ICA:                     97/31cm/sec CCA:                     06/26RS/WNI SYSTOLIC ICA/CCA RATIO:  1.5 DIASTOLIC ICA/CCA RATIO: 2.4 ECA:                     130/15cm/sec FINDINGS: RIGHT CAROTID  ARTERY: Calcified plaque in the distal common carotid artery and circumferential in the bulb. Plaque extends into the proximal ICA. There is a short segment high-grade stenosis with focal aliasing on color Doppler interrogation. Markedly elevated peak systolic velocities and elevated diastolic velocities just distal to the lesion. Elsewhere normal waveforms and color Doppler signal. RIGHT VERTEBRAL ARTERY:  Normal flow direction and waveform. LEFT CAROTID ARTERY: Partially calcified circumferential plaque in the bulb extending into proximal internal and external carotid arteries. Normal waveforms and color Doppler signal throughout however. LEFT VERTEBRAL ARTERY: Normal flow direction and waveform. IMPRESSION: 1. Critical near-occlusive proximal right ICA short-segment stenosis. Vascular surgical or neurointerventional radiology consultation recommended. 2. Left carotid plaque resulting in less than 50% stenosis. 3.  Antegrade bilateral vertebral arterial flow. These results will be called to the ordering clinician or representative by the Radiologist Assistant, and communication documented in the PACS or zVision Dashboard. Electronically Signed   By: Lucrezia Europe M.D.   On: 12/04/2017 12:32    Procedures Procedures (including critical care time)  Medications Ordered in ED Medications  HYDROcodone-acetaminophen (NORCO/VICODIN) 5-325 MG per tablet 1-2 tablet (not administered)  potassium chloride SA (K-DUR,KLOR-CON) CR tablet 20 mEq (not administered)  atorvastatin (LIPITOR) tablet 20 mg (not administered)  fenofibrate tablet 160 mg (not administered)  amLODipine (NORVASC) tablet 10 mg (not administered)  acetaminophen (TYLENOL) tablet 650 mg (not administered)    Or  acetaminophen (TYLENOL) solution 650 mg (not administered)    Or  acetaminophen (TYLENOL) suppository 650 mg (not administered)  losartan (COZAAR) tablet 100 mg (not administered)    And  hydrochlorothiazide (MICROZIDE) capsule 12.5 mg  (not administered)  heparin ADULT infusion 100 units/mL (25000 units/242mL sodium chloride 0.45%) (1,250 Units/hr Intravenous Transfusing/Transfer 12/05/17 0040)  iopamidol (ISOVUE-370) 76 % injection (50 mLs  Contrast Given 12/04/17 1924)   stroke: mapping our early stages of recovery book ( Does not apply Given 12/05/17 0148)  iopamidol (ISOVUE-370) 76 % injection (50 mLs  Contrast Given 12/05/17 0000)     Initial Impression / Assessment and Plan / ED Course  I have reviewed the triage vital signs and the nursing notes.  Pertinent labs & imaging results that were available during my care of the patient were reviewed by me and considered in my medical decision making (see chart for details).     Patient is a 67 year old male with history of A. fib on Eliquis, CAD, hyperlipidemia, hypertension, IBS, tobacco abuse, peripheral artery disease who presents  for vascular surgery evaluation for near total right ICA occlusion.  He has had multiple episodes of neurological symptoms consistent with TIA likely related to either his A. fib or his right ICA plaque.  He is currently without any acute neurological deficits.  Vascular surgery was present at bedside and they plan to perform a procedure either stenting or endarterectomy on Friday after his Eliquis washes out.  Plan to admit to hospitalist.  CTA head and neck as well as MRI brain ordered per vascular surgery request.  Basic labs being obtained.    Patient admitted in stable condition.  Final Clinical Impressions(s) / ED Diagnoses   Final diagnoses:  None  1. TIA 2. R ICA Occlusion 3. A fib 4. fatigue  ED Discharge Orders    None       Tobie Poet, DO 12/05/17 0151    Tegeler, Gwenyth Allegra, MD 12/05/17 [phone]

## 2017-12-04 NOTE — Consult Note (Addendum)
[facility] NOTE   MRN : [id number]  Reason for Consult: Right ICA stenosis Referring Physician: ED  History of Present Illness: 67 y/o male who is followed by our office s/p  right superficial femoral artery stenting on 01/25/2017 by Dr. Oneida Alar.  He was sent to the ED by his primary care physician  After a carotid duplex revealed subtotal right ICA occlusion.     He describes a 3 month history of right vision changes and left UE weakness/discooordination.  He describes that his right eye sight is like looking through a kaleidescope after he bends over and sits back up.  This has occurred 6-7 times.  He has also been dropping objects from his left hand.  He feels like he has to concentrated on what he is holding.   He denise left leg weakness, but reports generalized bilateral lower extremity tiredness.  He feels he is more tired lately in general and wants to sleep 10 hours a day.         Past medical history includes: A fib managed with Eliquis, MI, CAD, HTN, tobacco abuse and hyperlipidemia.        Current Facility-Administered Medications  Medication Dose Route Frequency Provider Last Rate Last Dose  . clopidogrel (PLAVIX) tablet 300 mg  300 mg Oral Once Elam Dutch, MD       Current Outpatient Medications  Medication Sig Dispense Refill  . amLODipine (NORVASC) 10 MG tablet Take 10 mg by mouth daily.     Marland Kitchen atorvastatin (LIPITOR) 20 MG tablet Take 20 mg by mouth daily.     . Carboxymethylcellul-Glycerin (REFRESH OPTIVE OP) Place 1 drop into both eyes every morning.     Marland Kitchen ELIQUIS 5 MG TABS tablet TAKE 1 TABLET(5 MG) BY MOUTH TWICE DAILY (Patient taking differently: Take 5 mg by mouth two times a day) 60 tablet 7  . fenofibrate (TRICOR) 145 MG tablet Take 145 mg by mouth daily.    Marland Kitchen HYDROcodone-acetaminophen (NORCO/VICODIN) 5-325 MG per tablet Take 1-2 tablets by mouth every 8 (eight) hours as needed (for pain).     Marland Kitchen  losartan-hydrochlorothiazide (HYZAAR) 100-25 MG per tablet Take 1 tablet by mouth daily.     . potassium chloride SA (K-DUR,KLOR-CON) 20 MEQ tablet Take 1 tablet (20 mEq total) by mouth daily. 90 tablet 3  . ofloxacin (OCUFLOX) 0.3 % ophthalmic solution Instill 1 drop in the affected eye every hour for the first 4-5 hours then use 1 drop in the affected eye twice daily as needed for infection    . VIAGRA 100 MG tablet Take 50 mg by mouth as needed for erectile dysfunction.       Pt meds include: Statin :Yes Betablocker: No ASA: No Other anticoagulants/antiplatelets: Eliquis  Past Medical History:  Diagnosis Date  . Atrial fibrillation ([place])   . CAD (coronary artery disease)   . HLD (hyperlipidemia)   . HTN (hypertension)   . IBS (irritable bowel syndrome)   . MI, old   . Tobacco abuse     Past Surgical History:  Procedure Laterality Date  . ABDOMINAL AORTOGRAM W/LOWER EXTREMITY N/A 01/25/2017   Procedure: Abdominal Aortogram w/Lower Extremity;  Surgeon: Elam Dutch, MD;  Location: [facility];  Service: Cardiovascular;  Laterality: N/A;  . ANTERIOR CRUCIATE LIGAMENT REPAIR    . CARDIAC CATHETERIZATION     [place]  . CARDIAC CATHETERIZATION N/A 07/27/2016   Procedure: Left Heart Cath and Coronary Angiography;  Surgeon:  Sherren Mocha, MD;  Location: [facility];  Service: Cardiovascular;  Laterality: N/A;  . CARDIOVERSION N/A 06/29/2016   Procedure: CARDIOVERSION;  Surgeon: Fay Records, MD;  Location: [facility] ENDOSCOPY;  Service: Cardiovascular;  Laterality: N/A;  . CHOLECYSTECTOMY    . [place], 2000   x 4 times.  Marland Kitchen PERIPHERAL VASCULAR INTERVENTION Right 01/25/2017   Procedure: Peripheral Vascular Intervention;  Surgeon: Elam Dutch, MD;  Location: [facility];  Service: Cardiovascular;  Laterality: Right;  . TONSILLECTOMY      Social History Social History   Tobacco Use  . Smoking status: Current Every Day Smoker    Packs/day:  0.75    Years: 43.00    Pack years: 32.25    Types: Cigarettes    Start date: 06/02/1971  . Smokeless tobacco: Never Used  Substance Use Topics  . Alcohol use: Yes    Alcohol/week: 0.0 oz    Comment: occasionaly  . Drug use: No    Family History Family History  Problem Relation Age of Onset  . Heart disease Father   . Melanoma Father   . Testicular cancer Father   . Hypertension Mother   . Arthritis Mother   . Arthritis Sister   . Lung disease Neg Hx     No Known Allergies   REVIEW OF SYSTEMS  General: [ ]  Weight loss, [ ]  Fever, [ ]  chills Neurologic: [ ]  Dizziness, [ ]  Blackouts, [ ]  Seizure [ ]  Stroke, [ ]  "Mini stroke", [ ]  Slurred speech, [x ] Temporary blindness; [x ] weakness in arms or legs, [ ]  Hoarseness [ ]  Dysphagia Cardiac: [ ]  Chest pain/pressure, [ ]  Shortness of breath at rest [ ]  Shortness of breath with exertion, [x ] Atrial fibrillation or irregular heartbeat  Vascular: [ ]  Pain in legs with walking, [ ]  Pain in legs at rest, [ ]  Pain in legs at night,  [ ]  Non-healing ulcer, [ ]  Blood clot in vein/DVT,   Pulmonary: [ ]  Home oxygen, [ ]  Productive cough, [ ]  Coughing up blood, [ ]  Asthma,  [ ]  Wheezing [ ]  COPD Musculoskeletal:  [ ]  Arthritis, [ ]  Low back pain, [ ]  Joint pain Hematologic: [ ]  Easy Bruising, [ ]  Anemia; [ ]  Hepatitis Gastrointestinal: [ ]  Blood in stool, [ ]  Gastroesophageal Reflux/heartburn, Urinary: [ ]  chronic Kidney disease, [ ]  on HD - [ ]  MWF or [ ]  TTHS, [ ]  Burning with urination, [ ]  Difficulty urinating Skin: [ ]  Rashes, [ ]  Wounds Psychological: [ ]  Anxiety, [ ]  Depression  Physical Examination Vitals:   12/04/17 1517 12/04/17 1521  BP: (!) 155/86   Pulse: 62   Resp: 16   Temp: 98.2 F (36.8 C)   TempSrc: Oral   SpO2: 99%   Weight: 187 lb (84.8 kg) 187 lb (84.8 kg)  Height: 5\' 10"  (1.778 m) 5\' 10"  (1.778 m)   Body mass index is 26.83 kg/m.  General:  WDWN in NAD  HENT: WNL Eyes: Pupils equal Pulmonary:  normal non-labored breathing , without Rales, rhonchi,  wheezing Cardiac: irregularly irregular, without  Murmurs, rubs or gallops; No carotid bruits Abdomen: soft, NT, no masses Skin: no rashes, ulcers noted;  no Gangrene , no cellulitis; no open wounds;   Vascular Exam/Pulses:radial, brachial, femoral, DP/PT palpable   Musculoskeletal: no muscle wasting or atrophy; no edema  Neurologic: A&O X 3; Appropriate Affect ;  SENSATION: normal; MOTOR FUNCTION:  Grossly 5/5 Symmetric Speech is fluent/normal   Significant Diagnostic Studies: CBC Lab Results  Component Value Date   WBC 7.6 10/23/2017   HGB 16.6 10/23/2017   HCT 48.6 10/23/2017   MCV 87 10/23/2017   PLT 338 10/23/2017    BMET    Component Value Date/Time   NA 142 10/23/2017 0933   K 3.9 10/23/2017 0933   CL 102 10/23/2017 0933   CO2 23 10/23/2017 0933   GLUCOSE 161 (H) 10/23/2017 0933   GLUCOSE 114 (H) 01/25/2017 0616   BUN 17 10/23/2017 0933   CREATININE 1.02 10/23/2017 0933   CREATININE 1.05 07/23/2016 0929   CALCIUM 9.2 10/23/2017 0933   GFRNONAA 77 10/23/2017 0933   GFRAA 89 10/23/2017 0933   CrCl cannot be calculated (Patient's most recent lab result is older than the maximum 21 days allowed.).  COAG Lab Results  Component Value Date   INR 1.2 (H) 07/23/2016   INR 1.2 (H) 06/28/2016     Non-Invasive Vascular Imaging:  Duplex Right ICA 696/789 systolic/end diastolic Left ICA 38/10   FINDINGS: RIGHT CAROTID ARTERY: Calcified plaque in the distal common carotid artery and circumferential in the bulb. Plaque extends into the proximal ICA. There is a short segment high-grade stenosis with focal aliasing on color Doppler interrogation. Markedly elevated peak systolic velocities and elevated diastolic velocities just distal to the lesion. Elsewhere normal waveforms and color Doppler Signal.  IMPRESSION: 1. Critical near-occlusive proximal right ICA short-segment stenosis. Vascular surgical or  neurointerventional radiology consultation recommended. 2. Left carotid plaque resulting in less than 50% stenosis. 3.  Antegrade bilateral vertebral arterial flow.  ASSESSMENT/PLAN:  Right ICA  Subtotal occlusion  His past 3 month history of symptoms, although the visual changes are not classic amaurosis,  are consistent with possible TIA verse CVA.  We will order an MRI of the brain and CTA of the head and neck.  His Eliquis will be held and Heparin can be started tomorrow since he did take his Eliquis this am.    He is followed by Dr. Sharrell Ku and we recommend pre surgical maximization.  Our plan will be right carotid ICA tent verse open CEA.     Full PE will be performed tomorrow.   Roxy Horseman 12/04/2017 5:10 PM   I agree with the above.  I have seen and evaluated the patient.  He is well-known to our practice having undergone intervention to his right leg on 01/25/2017 by Dr. Oneida Alar.  This is for claudication.  He was sent today for a carotid ultrasound by his medical doctor because he was describing some right eye visual changes.  On further discussions, the patient has had approximately 7-8 episodes where he describes a flash going off in his right eye where he loses most of his vision but it resolves within 45 seconds to a minute.  He also reports some perioral numbness in the recent past as well as some episodes of left-sided clumsiness.  He had a MRI which shows acute and subacute punctate areas consistent with stroke in the right brain.  A CT angiogram is pending.  The patient is on Eliquis for atrial fibrillation, his last dose was this morning.  I discussed with this patient and his wife that he should be admitted.  He has a ultrasound that shows a high-grade stenosis in the right carotid artery which is the likely explanation for his stroke.  I would recommend possibly bridging him with heparin starting tomorrow because of his  recurrent symptoms.  We will plan for carotid  revascularization this admission, likely on Friday.  After his CT angiogram of the neck has been performed we will discuss proceeding with stenting versus endarterectomy.  The patient is in agreement with this plan.  Annamarie Major

## 2017-12-04 NOTE — ED Notes (Signed)
ED Provider at bedside. 

## 2017-12-04 NOTE — ED Notes (Signed)
RN attempted to call report; RN to call back-Monique,RN  

## 2017-12-04 NOTE — ED Notes (Signed)
Blood bank sample previously drawn by [facility], verified correct blood bank bracelet and patient sent down to lab for type in screen ordered by Admitting Md-Monique,RN

## 2017-12-04 NOTE — ED Notes (Signed)
Patient transported to MRI 

## 2017-12-04 NOTE — ED Notes (Signed)
Patient transported to CT 

## 2017-12-04 NOTE — ED Notes (Signed)
Pt returned from [facility]

## 2017-12-04 NOTE — Progress Notes (Signed)
ANTICOAGULATION CONSULT NOTE - Initial Consult  Pharmacy Consult for heparin Indication: atrial fibrillation  No Known Allergies  Patient Measurements: Height: 5\' 10"  (177.8 cm) Weight: 187 lb (84.8 kg) IBW/kg (Calculated) : 73 Heparin Dosing Weight: 84.8 kg  Assessment: 67 yo M presents on 3/13 with blocked R carotid. On Eliquis PTA for Afib. Will bridge to heparin for possible procedure this admit. Last dose of Eliquis was 3/13 at 10 am. CBC stable.  Goal of Therapy:  Heparin level 0.3-0.7 units/ml Monitor platelets by anticoagulation protocol: Yes   Plan:  Start heparin gtt at 1,250 units/hr. No bolus Monitor daily heparin level, CBC, s/s of bleed   , J 12/04/2017,9:17 PM

## 2017-12-04 NOTE — ED Triage Notes (Signed)
Pt sent by PCP, carotid doppler done today, right carotid blocked, pt states he is to see a vascular surgeon for potential surgery today. Denies pain, occasional visual disturbances in the right eye.

## 2017-12-05 ENCOUNTER — Encounter (HOSPITAL_COMMUNITY): Payer: BLUE CROSS/BLUE SHIELD

## 2017-12-05 ENCOUNTER — Ambulatory Visit: Payer: BLUE CROSS/BLUE SHIELD | Admitting: Family

## 2017-12-05 ENCOUNTER — Other Ambulatory Visit (HOSPITAL_COMMUNITY): Payer: BLUE CROSS/BLUE SHIELD

## 2017-12-05 ENCOUNTER — Other Ambulatory Visit (HOSPITAL_COMMUNITY): Payer: Self-pay

## 2017-12-05 ENCOUNTER — Inpatient Hospital Stay (HOSPITAL_COMMUNITY): Payer: BLUE CROSS/BLUE SHIELD

## 2017-12-05 ENCOUNTER — Other Ambulatory Visit: Payer: Self-pay

## 2017-12-05 DIAGNOSIS — I482 Chronic atrial fibrillation: Secondary | ICD-10-CM

## 2017-12-05 DIAGNOSIS — Z01810 Encounter for preprocedural cardiovascular examination: Secondary | ICD-10-CM

## 2017-12-05 DIAGNOSIS — I2510 Atherosclerotic heart disease of native coronary artery without angina pectoris: Secondary | ICD-10-CM

## 2017-12-05 DIAGNOSIS — I6521 Occlusion and stenosis of right carotid artery: Secondary | ICD-10-CM

## 2017-12-05 DIAGNOSIS — I10 Essential (primary) hypertension: Secondary | ICD-10-CM

## 2017-12-05 DIAGNOSIS — I2583 Coronary atherosclerosis due to lipid rich plaque: Secondary | ICD-10-CM

## 2017-12-05 DIAGNOSIS — E7800 Pure hypercholesterolemia, unspecified: Secondary | ICD-10-CM

## 2017-12-05 LAB — APTT
APTT: 42 s — AB (ref 24–36)
aPTT: 62 seconds — ABNORMAL HIGH (ref 24–36)
aPTT: 93 seconds — ABNORMAL HIGH (ref 24–36)

## 2017-12-05 LAB — LIPID PANEL
Cholesterol: 125 mg/dL (ref 0–200)
HDL: 40 mg/dL — ABNORMAL LOW (ref >40)
LDL CALC: 75 mg/dL (ref 0–99)
Total CHOL/HDL Ratio: 3.1 RATIO
Triglycerides: 51 mg/dL (ref <150)
VLDL: 10 mg/dL (ref 0–40)

## 2017-12-05 LAB — CBC WITH DIFFERENTIAL/PLATELET
Basophils Absolute: 0 10*3/uL (ref 0.0–0.1)
Basophils Relative: 0 %
EOS ABS: 0.2 10*3/uL (ref 0.0–0.7)
EOS PCT: 3 %
HCT: 41.6 % (ref 39.0–52.0)
Hemoglobin: 14.3 g/dL (ref 13.0–17.0)
LYMPHS ABS: 3.2 10*3/uL (ref 0.7–4.0)
Lymphocytes Relative: 54 %
MCH: 29.9 pg (ref 26.0–34.0)
MCHC: 34.4 g/dL (ref 30.0–36.0)
MCV: 86.8 fL (ref 78.0–100.0)
Monocytes Absolute: 0.4 10*3/uL (ref 0.1–1.0)
Monocytes Relative: 7 %
Neutro Abs: 2.1 10*3/uL (ref 1.7–7.7)
Neutrophils Relative %: 36 %
PLATELETS: 259 10*3/uL (ref 150–400)
RBC: 4.79 MIL/uL (ref 4.22–5.81)
RDW: 14.1 % (ref 11.5–15.5)
WBC: 5.9 10*3/uL (ref 4.0–10.5)

## 2017-12-05 LAB — BASIC METABOLIC PANEL
Anion gap: 9 (ref 5–15)
BUN: 15 mg/dL (ref 6–20)
CALCIUM: 8.5 mg/dL — AB (ref 8.9–10.3)
CO2: 24 mmol/L (ref 22–32)
CREATININE: 0.89 mg/dL (ref 0.61–1.24)
Chloride: 105 mmol/L (ref 101–111)
GFR calc Af Amer: >60 mL/min (ref >60)
Glucose, Bld: 204 mg/dL — ABNORMAL HIGH (ref 65–99)
POTASSIUM: 3.1 mmol/L — AB (ref 3.5–5.1)
SODIUM: 138 mmol/L (ref 135–145)

## 2017-12-05 LAB — MAGNESIUM: Magnesium: 1.7 mg/dL (ref 1.7–2.4)

## 2017-12-05 LAB — SURGICAL PCR SCREEN
MRSA, PCR: NEGATIVE
STAPHYLOCOCCUS AUREUS: NEGATIVE

## 2017-12-05 LAB — GLUCOSE, CAPILLARY
Glucose-Capillary: 125 mg/dL — ABNORMAL HIGH (ref 65–99)
Glucose-Capillary: 154 mg/dL — ABNORMAL HIGH (ref 65–99)

## 2017-12-05 LAB — HEPARIN LEVEL (UNFRACTIONATED): HEPARIN UNFRACTIONATED: 0.7 [IU]/mL (ref 0.30–0.70)

## 2017-12-05 LAB — HEMOGLOBIN A1C
Hgb A1c MFr Bld: 6.8 % — ABNORMAL HIGH (ref 4.8–5.6)
Mean Plasma Glucose: 148.46 mg/dL

## 2017-12-05 MED ORDER — NICOTINE 21 MG/24HR TD PT24
21.0000 mg | MEDICATED_PATCH | Freq: Every day | TRANSDERMAL | Status: DC
  Administered 2017-12-05 – 2017-12-06 (×2): 21 mg via TRANSDERMAL
  Filled 2017-12-05 (×3): qty 1

## 2017-12-05 MED ORDER — HEPARIN (PORCINE) IN NACL 100-0.45 UNIT/ML-% IJ SOLN
1700.0000 [IU]/h | INTRAMUSCULAR | Status: DC
Start: 2017-12-05 — End: 2017-12-06
  Administered 2017-12-05: 1700 [IU]/h via INTRAVENOUS
  Filled 2017-12-05: qty 250

## 2017-12-05 MED ORDER — SODIUM CHLORIDE 0.9 % IV SOLN
1.5000 g | INTRAVENOUS | Status: AC
  Administered 2017-12-06: 1.5 g via INTRAVENOUS
  Filled 2017-12-05: qty 1.5

## 2017-12-05 MED ORDER — INSULIN ASPART 100 UNIT/ML ~~LOC~~ SOLN
0.0000 [IU] | Freq: Three times a day (TID) | SUBCUTANEOUS | Status: DC
  Administered 2017-12-05: 1 [IU] via SUBCUTANEOUS
  Administered 2017-12-07 (×2): 2 [IU] via SUBCUTANEOUS

## 2017-12-05 NOTE — H&P (View-Only) (Signed)
[facility] [place]  Subjective  - Doing well this am, no new symptoms or compalints.   Objective 133/82 62 98.2 F (36.8 C) (Oral) 18 100%  Intake/Output Summary (Last 24 hours) at 12/05/2017 0837 Last data filed at 12/05/2017 0300 Gross per 24 hour  Intake 387.63 ml  Output 200 ml  Net 187.63 ml    Palpable radial, femoral, DP/PT pulses B No gross weakness in all 4 extremities Pupils equal, occular motor intact Heart Irregularly irregular Lungs CTA, non labored breathing Abdomin soft NTTP  CTA neck 12/05/2017 IMPRESSION: 1. 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 Distal right ICA is patent. 2. 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 3. Left carotid bifurcation atherosclerotic disease without hemodynamically significant stenosis by NASCET criteria. 4.  Aortic Atherosclerosis (ICD10-I70.0).  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 No hemorrhage or mass effect.  CTA brain 12/04/2017 IMPRESSION: Calcification of the cavernous internal carotid arteries and distal RIGHT vertebral artery consistent with cerebrovascular atherosclerotic disease. No signs of intracranial large vessel occlusion.  No findings to suggest acute intracranial ischemia. No abnormal postcontrast enhancement.  Assessment/Planning: Right carotid ICA subtotal occlusion  MRI confirms infarts  Dr. [name] has reviewed the MRI and CTA of head and neck.  We will schedule him for right CEA with Dr. [name] tomorrow 12/06/2017.  NPO past MN.  The plan was discussed at bedside and all questions and concerns were addressed.   Roxy Horseman 12/05/2017 8:37 AM --  Laboratory Lab  Results: Recent Labs    12/04/17 1530 12/05/17 0445  WBC 6.8 5.9  HGB 16.0 14.3  HCT 46.1 41.6  PLT 303 259   BMET Recent Labs    12/04/17 1530 12/05/17 0445  NA 137 138  K 3.7 3.1*  CL 104 105  CO2 23 24  GLUCOSE 116* 204*  BUN 18 15  CREATININE 0.99 0.89  CALCIUM 9.2 8.5*    COAG Lab Results  Component Value Date   INR 1.15 12/04/2017   INR 1.2 (H) 07/23/2016   INR 1.2 (H) 06/28/2016   No results found for: PTT  I agree with the above.  I have seen and evaluated the patient.  We discussed proceeding with right carotid endarterectomy tomorrow.  We discussed the risks and benefits including the risk of stroke, nerve injury, bleeding, and cardiopulmonary complications.  All questions answered.  Annamarie Major

## 2017-12-05 NOTE — Progress Notes (Addendum)
PROGRESS NOTE    Scott [name]  WCB:[id number] DOB: 11/09/1950 DOA: 12/04/2017 PCP: Shirline Frees, MD    Brief Narrative by Dr Tamala Julian: " Scott [name] is a 67 y.o. male with medical history significant of HTN, HLD, A. fib on Eliquis, CAD, and PVD s/p right superficial femoral artery stenting in 01/2017 by Dr. Oneida Alar; who presents after having abnormal doppler ultrasound of the carotids today which revealed signs of right carotid artery occlusion at [facility] imaging earlier the day of admission.  Patient reports that for the last several weeks he has had at least 8-10 episodes where he reports right eye vision loss that he states is kind of like a veil coming over his eyes.  Symptoms last 35-40 seconds and then self resolve.  The last such episode occurred with these complaints he reports associated symptoms of  intermittent left hand weakness and/or discoordination intimately dropping things, lip tingling, fatigue, intermittent shortness of breath, and generalized leg and tiredness.  1-2 weeks ago.  He was seen by the ophthalmologist day of admission and noted to have an a relatively normal exam.  The leg fatigue symptoms he reports are not like when he required s 5/tent placement back in 01/2017.  Denies having any leg swelling, calf pain, at this time he reports still smoking three fourths a pack of cigarettes per day on average but he is trying to cut back with a vapor pen".  ED Course: On admission into the emergency department patient was seen to be afebrile, pulse 62-117, respirations 16, blood pressure 155/86-169/96, and O2 saturations 94-99% on room air.  Labs including CBC, BMP, PT, aPTT were unremarkable.  Vascular surgery was consulted and recommended holding Eliquis and starting the patient on heparin in the morning for possible surgical intervention to tentatively set for 3/15.    Assessment & Plan:   Principal Problem:   Internal carotid artery stenosis, right Active Problems:   Atrial fibrillation (HCC)   Essential hypertension   Hyperlipidemia   Arthritis   Tobacco use disorder   1-Right Carotid Occlusion;  CTA showed severe at least 90 % stenosis of proximal right internal carotid artery.  Now on heparin Gtt.  Plan for carotid endarterectomy  Dr [name] on 3-15. Cardiology consulted for cardiac clearance, also patient had 2.0 pause sec episode last night.   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  He was on eliquis prior to admission.  He was started on heparin per vascular recommendation.  Will consult Neurology.  Will check Lipid panel and Hb-A1c.  Permissive Hypertension. Hold Norvasc, HCTZ and losartan. Check ECHO.   DM; new diagnosis.  Hb A1 c at 6.8. Check CBG and SSI for now   HTN; permissive HTN in setting of stroke.   Hypokalemia; replete orally.   Afib;  Holding eliquis.  Continue with heparin.   DVT prophylaxis: Heparin  Code Status: Full code.  Family Communication: Care discussed with patient Disposition Plan: sx 3-15 Consultants:   Vascular.   Cardiology  Neurology   Procedures:  None   Antimicrobials: none   Subjective: He  Is feeling ok, denies vision changes.   Objective: Vitals:   12/04/17 2015 12/05/17 0102 12/05/17 0253 12/05/17 0609  BP:  (!) 159/90 (!) 144/66 133/82  Pulse: (!) 51 (!) 56 (!) 58 62  Resp:   17 18  Temp:  (!) 97.5 F (36.4 C)  98.2 F (36.8 C)  TempSrc:  Oral  Oral  SpO2: 99% 100% 100%  100%  Weight:  87.3 kg (192 lb 8 oz)  87.3 kg (192 lb 8 oz)  Height:  5\' 10"  (1.778 m)      Intake/Output Summary (Last 24 hours) at 12/05/2017 0844 Last data filed at 12/05/2017 0300 Gross per 24 hour  Intake 387.63 ml  Output 200 ml  Net 187.63 ml   Filed Weights   12/04/17 1521 12/05/17 0102 12/05/17 0609  Weight: 84.8 kg (187 lb) 87.3 kg (192 lb 8 oz) 87.3 kg (192 lb 8 oz)    Examination:  General exam: Appears calm and comfortable  Respiratory system: Clear to  auscultation. Respiratory effort normal. Cardiovascular system: S1 & S2 heard, RRR. No JVD, murmurs, rubs, gallops or clicks. No pedal edema. Gastrointestinal system: Abdomen is nondistended, soft and nontender. No organomegaly or masses felt. Normal bowel sounds heard. Central nervous system: Alert and oriented. No focal neurological deficits. Extremities: Symmetric 5 x 5 power. Skin: No rashes, lesions or ulcers Psychiatry: Judgement and insight appear normal. Mood & affect appropriate.     Data Reviewed: I have personally reviewed following labs and imaging studies  CBC: Recent Labs  Lab 12/04/17 1530 12/05/17 0445  WBC 6.8 5.9  NEUTROABS 4.2 2.1  HGB 16.0 14.3  HCT 46.1 41.6  MCV 87.1 86.8  PLT 303 951   Basic Metabolic Panel: Recent Labs  Lab 12/04/17 1530 12/05/17 0445  NA 137 138  K 3.7 3.1*  CL 104 105  CO2 23 24  GLUCOSE 116* 204*  BUN 18 15  CREATININE 0.99 0.89  CALCIUM 9.2 8.5*  MG  --  1.7   GFR: Estimated Creatinine Clearance: 86.6 mL/min (by C-G formula based on SCr of 0.89 mg/dL). Liver Function Tests: No results for input(s): AST, ALT, ALKPHOS, BILITOT, PROT, ALBUMIN in the last 168 hours. No results for input(s): LIPASE, AMYLASE in the last 168 hours. No results for input(s): AMMONIA in the last 168 hours. Coagulation Profile: Recent Labs  Lab 12/04/17 1530  INR 1.15   Cardiac Enzymes: No results for input(s): CKTOTAL, CKMB, CKMBINDEX, TROPONINI in the last 168 hours. BNP (last 3 results) No results for input(s): PROBNP in the last 8760 hours. HbA1C: No results for input(s): HGBA1C in the last 72 hours. CBG: No results for input(s): GLUCAP in the last 168 hours. Lipid Profile: No results for input(s): CHOL, HDL, LDLCALC, TRIG, CHOLHDL, LDLDIRECT in the last 72 hours. Thyroid Function Tests: No results for input(s): TSH, T4TOTAL, FREET4, T3FREE, THYROIDAB in the last 72 hours. Anemia Panel: No results for input(s): VITAMINB12, FOLATE,  FERRITIN, TIBC, IRON, RETICCTPCT in the last 72 hours. Sepsis Labs: No results for input(s): PROCALCITON, LATICACIDVEN in the last 168 hours.  No results found for this or any previous visit (from the past 240 hour(s)).       Radiology Studies: Ct Angio Head W Or Wo Contrast  Result Date: 12/04/2017 CLINICAL DATA:  RIGHT-sided upper extremity weakness and visual changes for 1 month. Subtotal RIGHT ICA occlusion. EXAM: CT ANGIOGRAPHY HEAD TECHNIQUE: Multidetector CT imaging of the head was performed using the standard protocol during bolus administration of intravenous contrast. Multiplanar CT image reconstructions and MIPs were obtained to evaluate the vascular anatomy. CONTRAST:  11mL ISOVUE-370 IOPAMIDOL (ISOVUE-370) INJECTION 76% COMPARISON:  BILATERAL carotid duplex ultrasound earlier today FINDINGS: CT HEAD Brain: No evidence of acute infarction, hemorrhage, hydrocephalus, extra-axial collection or mass lesion/mass effect. Normal for age cerebral volume. No definite white matter disease. Tiny perivascular space versus chronic lacunar infarct, RIGHT posterior  frontal subcortical white matter Vascular: No hyperdense vessel or unexpected calcification. Skull: Normal. Negative for fracture or focal lesion. Sinuses: No acute findings. Orbits: Unremarkable. CTA HEAD Anterior circulation: No significant stenosis, proximal occlusion, aneurysm, or vascular malformation. Calcific change affects the BILATERAL upper cervical, petrous, cavernous, and supraclinoid internal carotid arteries, without significant narrowing. Posterior circulation: No significant stenosis, proximal occlusion, aneurysm, or vascular malformation. RIGHT vertebral dominant, with LEFT vertebral primarily supplying the posterior inferior cerebellar artery. Distal vertebral calcifications RIGHT V4 segment, nonstenotic. Venous sinuses: As permitted by contrast timing, patent. Anatomic variants: Fetal origin LEFT PCA.  Hypoplastic RIGHT A1  ACA Delayed phase: No abnormal intracranial enhancement. IMPRESSION: Calcification of the cavernous internal carotid arteries and distal RIGHT vertebral artery consistent with cerebrovascular atherosclerotic disease. No signs of intracranial large vessel occlusion. No findings to suggest acute intracranial ischemia. No abnormal postcontrast enhancement. Electronically Signed   By: Staci Righter M.D.   On: 12/04/2017 20:13   Ct Angio Neck W And/or Wo Contrast  Result Date: 12/05/2017 CLINICAL DATA:  Right carotid stenosis. Scheduled for vascular surgery in 2 days. EXAM: CT ANGIOGRAPHY NECK TECHNIQUE: Multidetector CT imaging of the neck was performed using the standard protocol during bolus administration of intravenous contrast. Multiplanar CT image reconstructions and MIPs were obtained to evaluate the vascular anatomy. Carotid stenosis measurements (when applicable) are obtained utilizing NASCET criteria, using the distal internal carotid diameter as the denominator. CONTRAST:  31mL ISOVUE-370 IOPAMIDOL (ISOVUE-370) INJECTION 76% COMPARISON:  Brain MRI 12/04/2017 CTA head 12/04/2017 FINDINGS: Aortic arch: There is mild calcific atherosclerosis of the aortic arch. There is no aneurysm, dissection or hemodynamically significant stenosis of the visualized ascending aorta and aortic arch. Conventional 3 vessel aortic branching pattern. The visualized proximal subclavian arteries are normal. Right carotid system: The right common carotid origin is widely patent. There is no common carotid or internal carotid artery dissection or aneurysm. Predominantly calcified atherosclerotic plaque at the right carotid bifurcation causes severe stenosis, measuring at least 90% by NASCET criteria. The distal right internal carotid artery is patent. Left carotid system: The left common carotid origin is widely patent. There is no common carotid or internal carotid artery dissection or aneurysm. There is mixed calcified and  noncalcified plaque at the left carotid bifurcation without hemodynamically significant stenosis by NASCET criteria. Vertebral arteries: The vertebral system is right dominant. Left vertebral artery origin is narrowed by atherosclerotic calcification. There is moderate narrowing of the V2 segment of the right vertebral artery at the level of the C3 vertebral body. There is severe stenosis of the right vertebral artery V3 segment (series 10, image 53). There is mild atherosclerotic calcification of the right V4 segment. Skeleton: There is no bony spinal canal stenosis. No lytic or blastic lesions. Other neck: The nasopharynx is clear. The oropharynx and hypopharynx are normal. The epiglottis is normal. The supraglottic larynx, glottis and subglottic larynx are normal. No retropharyngeal collection. The parapharyngeal spaces are preserved. The parotid and submandibular glands are normal. No sialolithiasis or salivary ductal dilatation. The thyroid gland is normal. There is no cervical lymphadenopathy. Upper chest: No pneumothorax or pleural effusion. No nodules or masses. Review of the MIP images confirms the above findings IMPRESSION: 1. 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 Distal right ICA is patent. 2. 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 3. Left carotid bifurcation atherosclerotic disease without hemodynamically significant stenosis by NASCET  criteria. 4.  Aortic Atherosclerosis (ICD10-I70.0). Electronically Signed   By: Ulyses Jarred M.D.   On: 12/05/2017 00:25   Mr Brain Wo Contrast  Result Date: 12/04/2017 CLINICAL DATA:  67 y/o M; 3 1 history of right-sided vision changes and left upper extremity weakness/discoordination. EXAM: MRI HEAD WITHOUT CONTRAST TECHNIQUE: Multiplanar, multiecho pulse sequences of the brain and surrounding structures were  obtained without intravenous contrast. COMPARISON:  12/04/2017 CT of the head. FINDINGS: Brain: Several scattered punctate foci of diffusion hyperintensity are present throughout the right frontal and parietal lobes. Lesions demonstrate variable diffusion on ADC. Foci of infarction demonstrate associated T2 FLAIR hyperintense signal abnormality. Cystic change of a single right posterior frontal lesion without susceptibility blooming (series 8, image 76). No susceptibility abnormality on the SWAN sequence to indicate intracranial hemorrhage. No extra-axial collection, hydrocephalus, or effacement of basilar cisterns. Vascular: Normal flow voids. Skull and upper cervical spine: Normal marrow signal. Sinuses/Orbits: Negative. Other: None. IMPRESSION: Several punctate foci of infarction in the right frontal and parietal lobes of mixed age including recent acute/early subacute infarctions. No hemorrhage or mass effect. Electronically Signed   By: Kristine Garbe M.D.   On: 12/04/2017 21:30   US Carotid Bilateral  Result Date: 12/04/2017 CLINICAL DATA:  Right eye visual disturbance. Coronary artery disease. Peripheral vascular disease. Hypertension, previous tobacco abuse. EXAM: BILATERAL CAROTID DUPLEX ULTRASOUND TECHNIQUE: Pearline Cables scale imaging, color Doppler and duplex ultrasound was performed of bilateral carotid and vertebral arteries in the neck. COMPARISON:  None available TECHNIQUE: Quantification of carotid stenosis is based on velocity parameters that correlate the residual internal carotid diameter with NASCET-based stenosis levels, using the diameter of the distal internal carotid lumen as the denominator for stenosis measurement. The following velocity measurements were obtained: PEAK SYSTOLIC/END DIASTOLIC RIGHT ICA:                     540/239cm/sec CCA:                     63/8VF/IEP SYSTOLIC ICA/CCA RATIO:  5.5 DIASTOLIC ICA/CCA RATIO: 29 ECA:                     132/17cm/sec LEFT ICA:                      97/31cm/sec CCA:                     32/95JO/ACZ SYSTOLIC ICA/CCA RATIO:  1.5 DIASTOLIC ICA/CCA RATIO: 2.4 ECA:                     130/15cm/sec FINDINGS: RIGHT CAROTID ARTERY: Calcified plaque in the distal common carotid artery and circumferential in the bulb. Plaque extends into the proximal ICA. There is a short segment high-grade stenosis with focal aliasing on color Doppler interrogation. Markedly elevated peak systolic velocities and elevated diastolic velocities just distal to the lesion. Elsewhere normal waveforms and color Doppler signal. RIGHT VERTEBRAL ARTERY:  Normal flow direction and waveform. LEFT CAROTID ARTERY: Partially calcified circumferential plaque in the bulb extending into proximal internal and external carotid arteries. Normal waveforms and color Doppler signal throughout however. LEFT VERTEBRAL ARTERY: Normal flow direction and waveform. IMPRESSION: 1. Critical near-occlusive proximal right ICA short-segment stenosis. Vascular surgical or neurointerventional radiology consultation recommended. 2. Left carotid plaque resulting in less than 50% stenosis. 3.  Antegrade bilateral vertebral arterial flow. These results will be called to the ordering clinician or  representative by the Radiologist Assistant, and communication documented in the PACS or zVision Dashboard. Electronically Signed   By: Lucrezia Europe M.D.   On: 12/04/2017 12:32        Scheduled Meds: . atorvastatin  20 mg Oral Daily  . fenofibrate  160 mg Oral Daily  . nicotine  21 mg Transdermal Daily  . potassium chloride SA  20 mEq Oral Daily   Continuous Infusions: . heparin 1,500 Units/hr (12/05/17 0743)     LOS: 1 day    Time spent: 35 minutes.     Elmarie Shiley, MD [facility] Pager [phone]  If 7PM-7AM, please contact night-coverage [email or web address] Password [facility] 12/05/2017, 8:44 AM

## 2017-12-05 NOTE — Progress Notes (Signed)
ANTICOAGULATION CONSULT NOTE - Follow Up Consult  Pharmacy Consult for heparin Indication: atrial fibrillation  Patient Measurements: Height: 5\' 10"  (177.8 cm) Weight: 192 lb 8 oz (87.3 kg) IBW/kg (Calculated) : 73 Heparin Dosing Weight: 87.3 kg   Labs: Recent Labs    12/04/17 1530 12/05/17 0445 12/05/17 1336  HGB 16.0 14.3  --   HCT 46.1 41.6  --   PLT 303 259  --   APTT 35 42* 62*  LABPROT 14.7  --   --   INR 1.15  --   --   HEPARINUNFRC  --  0.70  --   CREATININE 0.99 0.89  --     Assessment: 67yo male,  subtherapeutic on heparin 1500 units/hr  with initial dosing while Eliquis on hold. APTT = 62 sec  Goal of Therapy:  aPTT 66-102 seconds   Plan:  Will increase heparin gtt by 2 units/kg/hr to 1700 units/hr and check level in 6 hours.    Nicole Cella, RPh Clinical Pharmacist Pager: [phone] 12/05/2017,3:25 PM

## 2017-12-05 NOTE — Progress Notes (Signed)
ANTICOAGULATION CONSULT NOTE - Follow Up Consult  Pharmacy Consult for heparin Indication: atrial fibrillation  Labs: Recent Labs    12/04/17 1530 12/05/17 0445  HGB 16.0 14.3  HCT 46.1 41.6  PLT 303 259  APTT 35 42*  LABPROT 14.7  --   INR 1.15  --   HEPARINUNFRC  --  0.70  CREATININE 0.99 0.89    Assessment: 66yo male subtherapeutic on heparin with initial dosing while Eliquis on hold.  Goal of Therapy:  aPTT 66-102 seconds   Plan:  Will increase heparin gtt by 3 units/kg/hr to 1500 units/hr and check level in 6 hours.    Wynona Neat, PharmD, BCPS  12/05/2017,7:32 AM

## 2017-12-05 NOTE — ED Notes (Signed)
Patient asked about food and drink; Patient advised for now to not eat or drink due to new findings on MRI; Receiving RN has been notified that patient did have large meal and drink without swallow screen being completed-Monique,RN

## 2017-12-05 NOTE — Progress Notes (Signed)
Received call from Telemetry, patient had a 2.02 pause.  He has gone down into the mid 30s at times but has come up into the 60s-70s.  I have let on-call provider know.   Patient schedule for right ICA surgery on Friday 3/15.  I will keep monitoring patient.

## 2017-12-05 NOTE — Consult Note (Addendum)
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 Tyrell Antonio  CC: Intermittent right vision loss  History is obtained from:patient, chart review  HPI: Scott [name] is a 67 y.o. 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  He reports over the last few months he has had between 6-10 episodes of transient right-sided vision loss which only last a few seconds and then resolve on its own.  He thinks these have often if not always been associated with change in position from bending over to standing up.  He has also noticed 2 episodes of decreased sensation in the left perioral area as well as some numbness in his left hand with decreased grip strength on occasion as well as dropping of small objects.  He denies any chest pain, palpitations or dyspnea. He says he was seen by his ophthalmologist for his vision changes and reportedly had a normal eye exam.  He was sent to the emergency department after his carotid Doppler findings for vascular surgery consultation and intervention for right carotid endarterectomy.  He has been taking Eliquis for atrial fibrillation, previously on Plavix prior to his diagnosis of A. fib.  He does not take a low-dose aspirin.  He takes atorvastatin 20 mg daily for cholesterol and fenofibrate 160 mg daily for triglycerides.  He denies any personal or family history of stroke.  He says his father did have a right carotid surgery in his mid 71s.  He does admit to continued tobacco use, currently smoking between a half a pack to three quarters of a pack per day down from 1-1.25 packs/day.  He has been  smoking since the age of 50.  MRI brain showed several punctate foci of infarction in the right frontal and parietal lobes which are thought to be acute to early subacute infarcts.  CTA head showed calcification of the cavernous internal carotid arteries and distal right vertebral artery.  CTA neck showed severe at least 90% stenosis of the proximal right ICA with severe stenosis of the right vertebral artery V3 segment which is patent distally.  Vascular surgery was subsequently consulted and plan for right endarterectomy tomorrow morning.  His Eliquis has been discontinued and he is currently on heparin for anticoagulation.  NIHSS: 0  ROS: A 14 point ROS was performed and is negative except as noted in the HPI.   Past Medical History:  Diagnosis Date  . Atrial fibrillation ([place])   . CAD (coronary artery disease)   . HLD (hyperlipidemia)   . HTN (hypertension)   . IBS (irritable bowel syndrome)   . MI, old   . Tobacco abuse     Family History  Problem Relation Age of Onset  . Heart disease Father   . Melanoma Father   . Testicular cancer Father   . Hypertension Mother   . Arthritis Mother   . Arthritis Sister   . Lung disease Neg Hx     Social History:  reports that he has been smoking cigarettes.  He started smoking about 46 years ago. He has a 32.25 pack-year smoking history. he has never used smokeless tobacco. He reports that he drinks alcohol. He reports that he does not use drugs.   Allergies: No Known  Allergies  Medications: I have reviewed the patient's current medications.  Exam: Current vital signs: BP (!) 163/82   Pulse 60   Temp 97.6 F (36.4 C) (Oral)   Resp 18   Ht 5' 10"  (1.778 m)   Wt 192 lb 8 oz (87.3 kg)   SpO2 100%   BMI 27.62 kg/m  Vital signs in last 24 hours: Temp:  [97.5 F (36.4 C)-98.2 F (36.8 C)] 97.6 F (36.4 C) (03/14 0931) Pulse Rate:  [51-117] 60 (03/14 0931) Resp:  [16-18] 18 (03/14 0609) BP: (133-169)/(66-96) 163/82 (03/14  0931) SpO2:  [94 %-100 %] 100 % (03/14 0931) Weight:  [187 lb (84.8 kg)-192 lb 8 oz (87.3 kg)] 192 lb 8 oz (87.3 kg) (03/14 8882)   Physical Exam  Constitutional: Appears well-developed and well-nourished.  Psych: Affect appropriate to situation Eyes: No scleral injection HENT: No OP obstrucion Head: Normocephalic.  Cardiovascular: Normal rate, irregular rhythm.  Respiratory: Effort normal and breath sounds normal to anterior ascultation GI: Soft.  No distension. There is no tenderness.  Skin: WDI  Neuro: Mental Status: Patient is awake, alert, oriented to person, place, month, year, and situation. Patient is able to give a clear and coherent history. No signs of aphasia or neglect Cranial Nerves: II: Visual Fields are full. Pupils are equal, round, and reactive to light.   III,IV, VI: EOMI without ptosis or diploplia.  V: Facial sensation is symmetric to light touch and tempearature VII: Facial movement is symmetric.  VIII: hearing is intact to voice X: Uvula elevates symmetrically XI: Shoulder shrug is symmetric. XII: tongue is midline without atrophy or fasciculations.  Motor: Tone is normal. Bulk is normal. 5/5 strength was present in all four extremities except 4/5 in the LUE at the deltoid and with grip in the left hand. Finger abduction is 5/5 bilaterally.  Sensory: Sensation is symmetric to light touch and temperature in the arms and legs. No extinction.  Deep Tendon Reflexes: 2+ and symmetric in the biceps and patellae.  Cerebellar: FNF is non-ataxic but slower on the left. Rapid alternating movements and HKS are intact bilaterally  I have reviewed labs in epic and the results pertinent to this consultation are:  Results for orders placed or performed during the hospital encounter of 12/04/17 (from the past 48 hour(s))  CBC with Differential     Status: None   Collection Time: 12/04/17  3:30 PM  Result Value Ref Range   WBC 6.8 4.0 - 10.5 K/uL   RBC 5.29 4.22 -  5.81 MIL/uL   Hemoglobin 16.0 13.0 - 17.0 g/dL   HCT 46.1 39.0 - 52.0 %   MCV 87.1 78.0 - 100.0 fL   MCH 30.2 26.0 - 34.0 pg   MCHC 34.7 30.0 - 36.0 g/dL   RDW 14.1 11.5 - 15.5 %   Platelets 303 150 - 400 K/uL   Neutrophils Relative % 61 %   Neutro Abs 4.2 1.7 - 7.7 K/uL   Lymphocytes Relative 31 %   Lymphs Abs 2.1 0.7 - 4.0 K/uL   Monocytes Relative 7 %   Monocytes Absolute 0.4 0.1 - 1.0 K/uL   Eosinophils Relative 1 %   Eosinophils Absolute 0.1 0.0 - 0.7 K/uL   Basophils Relative 0 %   Basophils Absolute 0.0 0.0 - 0.1 K/uL    Comment: Performed at [facility], [address] [place], [place] [address]  Basic metabolic panel     Status: Abnormal   Collection Time: 12/04/17  3:30 PM  Result Value Ref Range   Sodium 137 135 - 145 mmol/L   Potassium 3.7 3.5 - 5.1 mmol/L   Chloride 104 101 - 111 mmol/L   CO2 23 22 - 32 mmol/L   Glucose, Bld 116 (H) 65 - 99 mg/dL   BUN 18 6 - 20 mg/dL   Creatinine, Ser 0.99 0.61 - 1.24 mg/dL   Calcium 9.2 8.9 - 10.3 mg/dL   GFR calc non Af Amer >60 >60 mL/min   GFR calc Af Amer >60 >60 mL/min    Comment: (NOTE) The eGFR has been calculated using the CKD EPI equation. This calculation has not been validated in all clinical situations. eGFR's persistently <60 mL/min signify possible Chronic Kidney Disease.    Anion gap 10 5 - 15    Comment: Performed at [facility] [address], [place], [place] [address]  Protime-INR     Status: None   Collection Time: 12/04/17  3:30 PM  Result Value Ref Range   Prothrombin Time 14.7 11.4 - 15.2 seconds   INR 1.15     Comment: Performed at [facility] [address] [place], [place] [address]  APTT     Status: None   Collection Time: 12/04/17  3:30 PM  Result Value Ref Range   aPTT 35 24 - 36 seconds    Comment: Performed at [facility] [address], [place], [place] [address]  Type and screen [place]     Status: None   Collection Time: 12/04/17   3:30 PM  Result Value Ref Range   ABO/RH(D) O POS    Antibody Screen NEG    Sample Expiration      12/07/2017 Performed at [facility], [place] [address], [place], [place] [address]   ABO/Rh     Status: None   Collection Time: 12/04/17  3:30 PM  Result Value Ref Range   ABO/RH(D)      O POS Performed at [facility] [address], [place], [place] [address]   CBC with Differential/Platelet     Status: None   Collection Time: 12/05/17  4:45 AM  Result Value Ref Range   WBC 5.9 4.0 - 10.5 K/uL   RBC 4.79 4.22 - 5.81 MIL/uL   Hemoglobin 14.3 13.0 - 17.0 g/dL   HCT 41.6 39.0 - 52.0 %   MCV 86.8 78.0 - 100.0 fL   MCH 29.9 26.0 - 34.0 pg   MCHC 34.4 30.0 - 36.0 g/dL   RDW 14.1 11.5 - 15.5 %   Platelets 259 150 - 400 K/uL   Neutrophils Relative % 36 %   Neutro Abs 2.1 1.7 - 7.7 K/uL   Lymphocytes Relative 54 %   Lymphs Abs 3.2 0.7 - 4.0 K/uL   Monocytes Relative 7 %   Monocytes Absolute 0.4 0.1 - 1.0 K/uL   Eosinophils Relative 3 %   Eosinophils Absolute 0.2 0.0 - 0.7 K/uL   Basophils Relative 0 %   Basophils Absolute 0.0 0.0 - 0.1 K/uL    Comment: Performed at [facility] [address] [place], [place] [address]  Basic metabolic panel     Status: Abnormal   Collection Time: 12/05/17  4:45 AM  Result Value Ref Range   Sodium 138 135 - 145 mmol/L   Potassium 3.1 (L) 3.5 - 5.1 mmol/L   Chloride 105 101 - 111 mmol/L   CO2 24  22 - 32 mmol/L   Glucose, Bld 204 (H) 65 - 99 mg/dL   BUN 15 6 - 20 mg/dL   Creatinine, Ser 0.89 0.61 - 1.24 mg/dL   Calcium 8.5 (L) 8.9 - 10.3 mg/dL   GFR calc non Af Amer >60 >60 mL/min   GFR calc Af Amer >60 >60 mL/min    Comment: (NOTE) The eGFR has been calculated using the CKD EPI equation. This calculation has not been validated in all clinical situations. eGFR's persistently <60 mL/min signify possible Chronic Kidney Disease.    Anion gap 9 5 - 15    Comment: Performed at [facility] [address]  [place], [place] [address]  Heparin level (unfractionated)     Status: None   Collection Time: 12/05/17  4:45 AM  Result Value Ref Range   Heparin Unfractionated 0.70 0.30 - 0.70 IU/mL    Comment:        IF HEPARIN RESULTS ARE BELOW EXPECTED VALUES, AND PATIENT DOSAGE HAS BEEN CONFIRMED, SUGGEST FOLLOW UP TESTING OF ANTITHROMBIN III LEVELS. Performed at [facility], [place] [address], [place], [place] [address]   Magnesium     Status: None   Collection Time: 12/05/17  4:45 AM  Result Value Ref Range   Magnesium 1.7 1.7 - 2.4 mg/dL    Comment: Performed at [facility] [address] [place], [place] [address]  APTT     Status: Abnormal   Collection Time: 12/05/17  4:45 AM  Result Value Ref Range   aPTT 42 (H) 24 - 36 seconds    Comment:        IF BASELINE aPTT IS ELEVATED, SUGGEST PATIENT RISK ASSESSMENT BE USED TO DETERMINE APPROPRIATE ANTICOAGULANT THERAPY. Performed at [facility], [place] [address], [place], [place] [address]   Hemoglobin A1c     Status: Abnormal   Collection Time: 12/05/17  8:58 AM  Result Value Ref Range   Hgb A1c MFr Bld 6.8 (H) 4.8 - 5.6 %    Comment: (NOTE) Pre diabetes:          5.7%-6.4% Diabetes:              >6.4% Glycemic control for   <7.0% adults with diabetes    Mean Plasma Glucose 148.46 mg/dL    Comment: Performed at [facility] [address], [place], [place] [address]     I have reviewed the images obtained:  Ct Angio Head W Or Wo Contrast Result Date: 12/04/2017 CLINICAL DATA:  RIGHT-sided upper extremity weakness and visual changes for 1 month. Subtotal RIGHT ICA occlusion. EXAM: CT ANGIOGRAPHY HEAD TECHNIQUE: Multidetector CT imaging of the head was performed using the standard protocol during bolus administration of intravenous contrast. Multiplanar CT image reconstructions and MIPs were obtained to evaluate the vascular anatomy. CONTRAST:  43m ISOVUE-370 IOPAMIDOL (ISOVUE-370) INJECTION 76% COMPARISON:   BILATERAL carotid duplex ultrasound earlier today FINDINGS: CT HEAD Brain: No evidence of acute infarction, hemorrhage, hydrocephalus, extra-axial collection or mass lesion/mass effect. Normal for age cerebral volume. No definite white matter disease. Tiny perivascular space versus chronic lacunar infarct, RIGHT posterior frontal subcortical white matter Vascular: No hyperdense vessel or unexpected calcification. Skull: Normal. Negative for fracture or focal lesion. Sinuses: No acute findings. Orbits: Unremarkable. CTA HEAD Anterior circulation: No significant stenosis, proximal occlusion, aneurysm, or vascular malformation. Calcific change affects the BILATERAL upper cervical, petrous, cavernous, and supraclinoid internal carotid arteries, without significant narrowing. Posterior circulation: No  significant stenosis, proximal occlusion, aneurysm, or vascular malformation. RIGHT vertebral dominant, with LEFT vertebral primarily supplying the posterior inferior cerebellar artery. Distal vertebral calcifications RIGHT V4 segment, nonstenotic. Venous sinuses: As permitted by contrast timing, patent. Anatomic variants: Fetal origin LEFT PCA.  Hypoplastic RIGHT A1 ACA Delayed phase: No abnormal intracranial enhancement. IMPRESSION: Calcification of the cavernous internal carotid arteries and distal RIGHT vertebral artery consistent with cerebrovascular atherosclerotic disease. No signs of intracranial large vessel occlusion. No findings to suggest acute intracranial ischemia. No abnormal postcontrast enhancement. Electronically Signed   By: Staci Righter M.D.   On: 12/04/2017 20:13   Ct Angio Neck W And/or Wo Contrast Result Date: 12/05/2017 CLINICAL DATA:  Right carotid stenosis. Scheduled for vascular surgery in 2 days. EXAM: CT ANGIOGRAPHY NECK TECHNIQUE: Multidetector CT imaging of the neck was performed using the standard protocol during bolus administration of intravenous contrast. Multiplanar CT image  reconstructions and MIPs were obtained to evaluate the vascular anatomy. Carotid stenosis measurements (when applicable) are obtained utilizing NASCET criteria, using the distal internal carotid diameter as the denominator. CONTRAST:  73m ISOVUE-370 IOPAMIDOL (ISOVUE-370) INJECTION 76% COMPARISON:  Brain MRI 12/04/2017 CTA head 12/04/2017 FINDINGS: Aortic arch: There is mild calcific atherosclerosis of the aortic arch. There is no aneurysm, dissection or hemodynamically significant stenosis of the visualized ascending aorta and aortic arch. Conventional 3 vessel aortic branching pattern. The visualized proximal subclavian arteries are normal. Right carotid system: The right common carotid origin is widely patent. There is no common carotid or internal carotid artery dissection or aneurysm. Predominantly calcified atherosclerotic plaque at the right carotid bifurcation causes severe stenosis, measuring at least 90% by NASCET criteria. The distal right internal carotid artery is patent. Left carotid system: The left common carotid origin is widely patent. There is no common carotid or internal carotid artery dissection or aneurysm. There is mixed calcified and noncalcified plaque at the left carotid bifurcation without hemodynamically significant stenosis by NASCET criteria. Vertebral arteries: The vertebral system is right dominant. Left vertebral artery origin is narrowed by atherosclerotic calcification. There is moderate narrowing of the V2 segment of the right vertebral artery at the level of the C3 vertebral body. There is severe stenosis of the right vertebral artery V3 segment (series 10, image 53). There is mild atherosclerotic calcification of the right V4 segment. Skeleton: There is no bony spinal canal stenosis. No lytic or blastic lesions. Other neck: The nasopharynx is clear. The oropharynx and hypopharynx are normal. The epiglottis is normal. The supraglottic larynx, glottis and subglottic larynx are  normal. No retropharyngeal collection. The parapharyngeal spaces are preserved. The parotid and submandibular glands are normal. No sialolithiasis or salivary ductal dilatation. The thyroid gland is normal. There is no cervical lymphadenopathy. Upper chest: No pneumothorax or pleural effusion. No nodules or masses. Review of the MIP images confirms the above findings IMPRESSION: 1. 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 Distal right ICA is patent. 2. 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 3. Left carotid bifurcation atherosclerotic disease without hemodynamically significant stenosis by NASCET criteria. 4.  Aortic Atherosclerosis (ICD10-I70.0). Electronically Signed   By: KUlyses JarredM.D.   On: 12/05/2017 00:25   Mr Brain Wo Contrast Result Date: 12/04/2017 CLINICAL DATA:  67y/o M; 3 1 history of right-sided vision changes and left upper extremity weakness/discoordination. EXAM: MRI HEAD WITHOUT CONTRAST TECHNIQUE: Multiplanar, multiecho pulse sequences of the  brain and surrounding structures were obtained without intravenous contrast. COMPARISON:  12/04/2017 CT of the head. FINDINGS: Brain: Several scattered punctate foci of diffusion hyperintensity are present throughout the right frontal and parietal lobes. Lesions demonstrate variable diffusion on ADC. Foci of infarction demonstrate associated T2 FLAIR hyperintense signal abnormality. Cystic change of a single right posterior frontal lesion without susceptibility blooming (series 8, image 76). No susceptibility abnormality on the SWAN sequence to indicate intracranial hemorrhage. No extra-axial collection, hydrocephalus, or effacement of basilar cisterns. Vascular: Normal flow voids. Skull and upper cervical spine: Normal marrow signal. Sinuses/Orbits: Negative. Other: None. IMPRESSION: Several  punctate foci of infarction in the right frontal and parietal lobes of mixed age including recent acute/early subacute infarctions. No hemorrhage or mass effect. Electronically Signed   By: Kristine Garbe M.D.   On: 12/04/2017 21:30   US Carotid Bilateral Result Date: 12/04/2017 CLINICAL DATA:  Right eye visual disturbance. Coronary artery disease. Peripheral vascular disease. Hypertension, previous tobacco abuse. EXAM: BILATERAL CAROTID DUPLEX ULTRASOUND TECHNIQUE: Pearline Cables scale imaging, color Doppler and duplex ultrasound was performed of bilateral carotid and vertebral arteries in the neck. COMPARISON:  None available TECHNIQUE: Quantification of carotid stenosis is based on velocity parameters that correlate the residual internal carotid diameter with NASCET-based stenosis levels, using the diameter of the distal internal carotid lumen as the denominator for stenosis measurement. The following velocity measurements were obtained: PEAK SYSTOLIC/END DIASTOLIC RIGHT ICA:                     540/239cm/sec CCA:                     88/8KC/MKL SYSTOLIC ICA/CCA RATIO:  5.5 DIASTOLIC ICA/CCA RATIO: 29 ECA:                     132/17cm/sec LEFT ICA:                     97/31cm/sec CCA:                     49/17HX/TAV SYSTOLIC ICA/CCA RATIO:  1.5 DIASTOLIC ICA/CCA RATIO: 2.4 ECA:                     130/15cm/sec FINDINGS: RIGHT CAROTID ARTERY: Calcified plaque in the distal common carotid artery and circumferential in the bulb. Plaque extends into the proximal ICA. There is a short segment high-grade stenosis with focal aliasing on color Doppler interrogation. Markedly elevated peak systolic velocities and elevated diastolic velocities just distal to the lesion. Elsewhere normal waveforms and color Doppler signal. RIGHT VERTEBRAL ARTERY:  Normal flow direction and waveform. LEFT CAROTID ARTERY: Partially calcified circumferential plaque in the bulb extending into proximal internal and external carotid arteries.  Normal waveforms and color Doppler signal throughout however. LEFT VERTEBRAL ARTERY: Normal flow direction and waveform. IMPRESSION: 1. Critical near-occlusive proximal right ICA short-segment stenosis. Vascular surgical or neurointerventional radiology consultation recommended. 2. Left carotid plaque resulting in less than 50% stenosis. 3.  Antegrade bilateral vertebral arterial flow. These results will be called to the ordering clinician or representative by the Radiologist Assistant, and communication documented in the PACS or zVision Dashboard. Electronically Signed   By: Lucrezia Europe M.D.   On: 12/04/2017 12:32   TTE on 10/31/2016: - Left ventricle: The cavity size was normal. Wall thickness was   increased in a pattern of mild LVH. Systolic function was normal.   The estimated  ejection fraction was in the range of 55% to 60%.   Wall motion was normal; there were no regional wall motion   abnormalities. The study is not technically sufficient to allow   evaluation of LV diastolic function. - Aortic valve: Trileaflet. Sclerosis without stenosis. There was   no regurgitation. - Mitral valve: Systolic bowing without prolapse. There was mild   regurgitation. - Left atrium: Moderately dilated. - Right atrium: The atrium was mildly dilated. - Tricuspid valve: There was mild regurgitation. - Pulmonary arteries: PA peak pressure: 27 mm Hg (S). - Inferior vena cava: The vessel was normal in size. The   respirophasic diameter changes were in the normal range (>= 50%),   consistent with normal central venous pressure. Impressions: - Compared to a prior study in 2016, there are no significant   changes.  Assessment:  67 y.o. male with hypertension, hyperlipidemia, atrial fibrillation on Eliquis, CAD, and peripheral vascular disease status post right superficial femoral artery stenting in May 2018 who is admitted for severe right ICA stenosis with multifocal subacute right frontal and parietal lobe  infarctions.  Stroke Risk Factors - atrial fibrillation, carotid stenosis, diabetes mellitus (new diagnosis), hyperlipidemia, hypertension and smoking  Mr. Tabet has acute to subacute right frontal and parietal lobe infarcts secondary to probable distal embolization of plaque or thrombi from severe atherosclerotic plaquing of the right internal carotid artery. The atherosclerotic narrowing is severe and hemodynamically significant, likely the underlying etiology for his transient vision loss episodes due to hypoperfusion during postural changes. He has many risk factors for stroke as listed above including what appears to be a new diagnosis of diabetes based on hemoglobin A1c of 6.8.  He has had appropriate imaging completed so far including MRI brain and CTA head/neck.  He had an echocardiogram on 10/31/2016 which showed an EF of 55-60%, aortic sclerosis without stenosis, and no report of thrombus.  Agree with planned right endarterectomy by vascular surgery tomorrow. He will need continued risk factor modification including tobacco cessation.  Plan: 1. Right Carotid Endarterectomy by vascular surgery tomorrow 2. Continue heparin anticoagulation 3. Risk factor modification - counseled on tobacco cessation 4. HgbA1c 6.8 - new diagnosis of diabetes, will need further management on an outpatient basis with lifestyle modifications 5. LDL 75 - increase to high-intensity Atorvastatin 40 mg 6. PT consult, OT consult, Speech consult 7. Telemetry monitoring 8. Frequent neuro checks   Zada Finders, MD Internal Medicine PGY-3  If 7pm- 7am, please page neurology on call as listed in [place].   I have seen and examined the patient. Assessment and plan discussed with neurology team, patient and family.  Electronically signed: Dr. Kerney Elbe 12/05/2017, 11:17 AM

## 2017-12-05 NOTE — Progress Notes (Signed)
Inpatient Diabetes Program Recommendations  AACE/ADA: New Consensus Statement on Inpatient Glycemic Control (2015)  Target Ranges:  Prepandial:   less than 140 mg/dL      Peak postprandial:   less than 180 mg/dL (1-2 hours)      Critically ill patients:  140 - 180 mg/dL   Review of Glycemic Control  Diabetes history: None Current orders for Inpatient glycemic control: None  Inpatient Diabetes Program Recommendations:    A1c 6.8% on 12/05/17. This meets criteria per ADA for new DM diagnosis. Patient will need close f/u with PCP.  Fasting glucose 204 mg/dl this am in labs. Please consider CBGs and possibly Novolog Sensitive Correction 0-9 units tid.  Thanks,  Tama Headings RN, MSN, BC-ADM, [facility] Inpatient Diabetes Coordinator Team Pager [phone] (8a-5p)

## 2017-12-05 NOTE — Progress Notes (Addendum)
[facility]   Subjective  - Doing well this am, no new symptoms or compalints.   Objective 133/82 62 98.2 F (36.8 C) (Oral) 18 100%  Intake/Output Summary (Last 24 hours) at 12/05/2017 0837 Last data filed at 12/05/2017 0300 Gross per 24 hour  Intake 387.63 ml  Output 200 ml  Net 187.63 ml    Palpable radial, femoral, DP/PT pulses B No gross weakness in all 4 extremities Pupils equal, occular motor intact Heart Irregularly irregular Lungs CTA, non labored breathing Abdomin soft NTTP  CTA neck 12/05/2017 IMPRESSION: 1. 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 Distal right ICA is patent. 2. 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 3. Left carotid bifurcation atherosclerotic disease without hemodynamically significant stenosis by NASCET criteria. 4.  Aortic Atherosclerosis (ICD10-I70.0).  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 No hemorrhage or mass effect.  CTA brain 12/04/2017 IMPRESSION: Calcification of the cavernous internal carotid arteries and distal RIGHT vertebral artery consistent with cerebrovascular atherosclerotic disease. No signs of intracranial large vessel occlusion.  No findings to suggest acute intracranial ischemia. No abnormal postcontrast enhancement.  Assessment/Planning: Right carotid ICA subtotal occlusion  MRI confirms infarts  Dr. [name] has reviewed the MRI and CTA of head and neck.  We will schedule him for right CEA with Dr. [name] tomorrow 12/06/2017.  NPO past MN.  The plan was discussed at bedside and all questions and concerns were addressed.   Roxy Horseman 12/05/2017 8:37 AM --  Laboratory Lab  Results: Recent Labs    12/04/17 1530 12/05/17 0445  WBC 6.8 5.9  HGB 16.0 14.3  HCT 46.1 41.6  PLT 303 259   BMET Recent Labs    12/04/17 1530 12/05/17 0445  NA 137 138  K 3.7 3.1*  CL 104 105  CO2 23 24  GLUCOSE 116* 204*  BUN 18 15  CREATININE 0.99 0.89  CALCIUM 9.2 8.5*    COAG Lab Results  Component Value Date   INR 1.15 12/04/2017   INR 1.2 (H) 07/23/2016   INR 1.2 (H) 06/28/2016   No results found for: PTT  I agree with the above.  I have seen and evaluated the patient.  We discussed proceeding with right carotid endarterectomy tomorrow.  We discussed the risks and benefits including the risk of stroke, nerve injury, bleeding, and cardiopulmonary complications.  All questions answered.  Annamarie Major

## 2017-12-05 NOTE — ED Notes (Signed)
Patient was eatting large meal and drinking 32oz drink on this RN arrival to [facility] swallow screen not done prior to this RN arrival to unit; Pt is a&ox 4 and no stroke sx noted-Monique,RN

## 2017-12-05 NOTE — Consult Note (Signed)
Cardiology Consultation:   Patient ID: Scott [name]; [id number]; 1951-01-18   Admit date: 12/04/2017 Date of Consult: 12/05/2017  Primary Care Provider: Shirline Frees, MD Primary Cardiologist: Dr. Acie Fredrickson  Patient Profile:   Scott [name] is a 67 y.o. male with a hx of HTN, HLD, chronic atrial fibrillation on Eliquis, known CAD s/p remote inferior MI and PCI to the distal RCA 2007 with last cath 07/2016 (which showed a widely patent LAD and diagonal branches with mild nonobstructive disease, patent RCA with patent stent and  chronic total occlusion of small left circumflex with collaterals), DM II and peripheral vascular disease status post right femoral artery stenting in May 2018 who is being seen today for preoperative evaluation for CEA for right critically occlusive internal carotid artery scheduled for 12/06/2017 at the request of Dr. Tyrell Antonio.   History of Present Illness:   Mr. Brightbill is a 67 year old male with a past medical history as stated above who was admitted to the hospital on 12/04/2017 after carotid Doppler ultrasound at [facility] showed a critical near occlusive proximal right internal carotid artery per carotid ultrasound yesterday 12/04/2017. He was initially seen by his PCP with complaints of ongoing dyspnea and a 3-week hx of right vision changes and left upper extremity weakness, fatigue and discoordination. He stated that his vision, on 6-7 separate occasions, was appearing as if he were looking through a kaleidoscope and he had been dropping objects from his left hand. He was examined by his Ophthalmologist and noted to have a normal exam. Vascular surgery and neurology were consulted with a plan to admit per hospitalist service and wait for Eliquis washout for a planned CEA per Dr. [name] on Friday, 12/06/2017.  Mr. No reports he has been consistently taking Eliquis for atrial fibrillation and is followed in the [facility] fib clinic.  He was previously  been cardioverted without success in 2017 and is not a flecainide candidate secondary to CAD and has not wanted to try Tikosyn. He was last seen by Dr. Acie Fredrickson on 05/15/2017 in which he seemed better overall, staying busy with yard work and remaining active, however, he feels that his activity levels have  declined since then. He reports that he has "good" and "bad" days with his activity levels and continues to feel intermittently short of breath with activity and easily fatigued, thought to be related to his chronic atrial fibrillation. He has been followed remotely by Dr. Ashok Cordia with pulmonary medicine for shortness of breath and bronchial type issues.  Given these ongoing symptoms, he was scheduled a re-look cardiac catheterization in 07/2016 which revealed a widely patent LAD and diagonal branches with mild nonobstructive disease, patent RCA with patent stent and  chronic total occlusion of small left circumflex with collaterals, essentially unchanged and stable from his last cardiac cath in 2007. He admits to continued tobacco use, currently smoking between half a pack per day, down from one pack/day from the age of 67yo. He denies chest pain, palpitations, diaphoresis,  back pain, or recent illnesses.   While in the hospital, an MRI of the brain was completed per vascular surgery request on 12/04/2017 which revealed several punctate foci of infarction in the right frontal and parietal lobes of mixed age including recent acute/early subacute infarctions. No hemorrhage or mass-effect. Additionally a CTA with brain was obtained which showed calcification of cavernous internal carotid arteries and distal right vertebral artery consistent with cerebrovascular atherosclerotic disease with no signs of intracranial large vessel occlusion.  Cardiology has been consulted given his known CAD disease and chronic atrial fibrillation on Eliquis therapy.  His Revised Cardiac Risk Index assessment was calculated at 6.6%  risk of major cardiac event, class III risk.  Given this mild elevated risk, a [facility] activity status index was calculated at 28.7=6.27 METS indicating moderate performance (4-10 METS) and no further cardiac testing is needed.   Past Medical History:  Diagnosis Date  . Atrial fibrillation ([place])   . CAD (coronary artery disease)   . HLD (hyperlipidemia)   . HTN (hypertension)   . IBS (irritable bowel syndrome)   . MI, old   . Tobacco abuse     Past Surgical History:  Procedure Laterality Date  . ABDOMINAL AORTOGRAM W/LOWER EXTREMITY N/A 01/25/2017   Procedure: Abdominal Aortogram w/Lower Extremity;  Surgeon: Elam Dutch, MD;  Location: [place] [facility];  Service: Cardiovascular;  Laterality: N/A;  . ANTERIOR CRUCIATE LIGAMENT REPAIR    . CARDIAC CATHETERIZATION     [place]  . CARDIAC CATHETERIZATION N/A 07/27/2016   Procedure: Left Heart Cath and Coronary Angiography;  Surgeon: Sherren Mocha, MD;  Location: [place] [facility];  Service: Cardiovascular;  Laterality: N/A;  . CARDIOVERSION N/A 06/29/2016   Procedure: CARDIOVERSION;  Surgeon: Fay Records, MD;  Location: [facility]OSCOPY;  Service: Cardiovascular;  Laterality: N/A;  . CHOLECYSTECTOMY    . [place], 2000   x 4 times.  Marland Kitchen PERIPHERAL VASCULAR INTERVENTION Right 01/25/2017   Procedure: Peripheral Vascular Intervention;  Surgeon: Elam Dutch, MD;  Location: [place] [facility];  Service: Cardiovascular;  Laterality: Right;  . TONSILLECTOMY       Prior to Admission medications   Medication Sig Start Date End Date Taking? Authorizing Provider  amLODipine (NORVASC) 10 MG tablet Take 10 mg by mouth daily.  03/18/15  Yes [provider]  atorvastatin (LIPITOR) 20 MG tablet Take 20 mg by mouth daily.  03/07/15  Yes [provider]  Carboxymethylcellul-Glycerin (REFRESH OPTIVE OP) Place 1 drop into both eyes every morning.    Yes [provider]  ELIQUIS 5 MG TABS tablet TAKE 1  TABLET(5 MG) BY MOUTH TWICE DAILY Patient taking differently: Take 5 mg by mouth two times a day 10/21/17  Yes [name], Wonda Cheng, MD  fenofibrate (TRICOR) 145 MG tablet Take 145 mg by mouth daily.   Yes [provider]  HYDROcodone-acetaminophen (NORCO/VICODIN) 5-325 MG per tablet Take 1-2 tablets by mouth every 8 (eight) hours as needed (for pain).  01/28/15  Yes [provider]  losartan-hydrochlorothiazide (HYZAAR) 100-25 MG per tablet Take 1 tablet by mouth daily.  01/29/15  Yes [provider]  ofloxacin (OCUFLOX) 0.3 % ophthalmic solution Instill 1 drop in the affected eye every hour for the first 4-5 hours then use 1 drop in the affected eye twice daily as needed for infection 10/15/16  Yes [provider]  potassium chloride SA (K-DUR,KLOR-CON) 20 MEQ tablet Take 1 tablet (20 mEq total) by mouth daily. 10/24/17  Yes [name], Wonda Cheng, MD  sildenafil (VIAGRA) 100 MG tablet Take 100 mg by mouth daily as needed for erectile dysfunction.   Yes [provider]    Inpatient Medications: Scheduled Meds: . atorvastatin  20 mg Oral Daily  . fenofibrate  160 mg Oral Daily  . insulin aspart  0-9 Units Subcutaneous TID WC  . nicotine  21 mg Transdermal Daily  . potassium chloride SA  20 mEq Oral Daily  Continuous Infusions: . [START ON 12/06/2017] cefUROXime (ZINACEF)  IV    . heparin 1,500 Units/hr (12/05/17 0743)   PRN Meds: acetaminophen **OR** acetaminophen (TYLENOL) oral liquid 160 mg/5 mL **OR** acetaminophen, HYDROcodone-acetaminophen  Allergies:   No Known Allergies  Social History:   Social History   Socioeconomic History  . Marital status: Married    Spouse name: Not on file  . Number of children: 2  . Years of education: Not on file  . Highest education level: Not on file  Social Needs  . Financial resource strain: Not on file  . Food insecurity - worry: Not on file  . Food insecurity - inability: Not on file  . Transportation needs -  medical: Not on file  . Transportation needs - non-medical: Not on file  Occupational History  . Occupation: retired  Tobacco Use  . Smoking status: Current Every Day Smoker    Packs/day: 0.75    Years: 43.00    Pack years: 32.25    Types: Cigarettes    Start date: 06/02/1971  . Smokeless tobacco: Never Used  Substance and Sexual Activity  . Alcohol use: Yes    Alcohol/week: 0.0 oz    Comment: occasionaly  . Drug use: No  . Sexual activity: Not on file  Other Topics Concern  . Not on file  Social History Narrative   He is originally from [place]. He moved to [facility] in [place]. Previously has traveled to most of the [place]. Has traveled to [place]. Has been to [place], [place], & [place]. 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 Remote bird exposure in 1980. No mold exposure.     Family History:   Family History  Problem Relation Age of Onset  . Heart disease Father   . Melanoma Father   . Testicular cancer Father   . Hypertension Mother   . Arthritis Mother   . Arthritis Sister   . Lung disease Neg Hx    Family Status:  Family Status  Relation Name Status  . Father  Deceased  . Mother  Deceased  . Sister  (Not Specified)  . MGM  Deceased  . MGF  Deceased  . PGM  Deceased  . PGF  Deceased  . Neg Hx  (Not Specified)    ROS:  Please see the history of present illness.  All other ROS reviewed and negative.     Physical Exam/Data:   Vitals:   12/05/17 0253 12/05/17 0609 12/05/17 0931 12/05/17 1149  BP: (!) 144/66 133/82 (!) 163/82 (!) 154/86  Pulse: (!) 58 62 60 61  Resp: 17 18    Temp:  98.2 F (36.8 C) 97.6 F (36.4 C) (!) 97.5 F (36.4 C)  TempSrc:  Oral Oral Oral  SpO2: 100% 100% 100% 100%  Weight:  192 lb 8 oz (87.3 kg)    Height:        Intake/Output Summary (Last 24 hours) at 12/05/2017 1521 Last data filed at 12/05/2017 0900 Gross per 24 hour  Intake 627.63 ml  Output 200 ml  Net 427.63 ml   Filed Weights    12/04/17 1521 12/05/17 0102 12/05/17 0609  Weight: 187 lb (84.8 kg) 192 lb 8 oz (87.3 kg) 192 lb 8 oz (87.3 kg)   Body mass index is 27.62 kg/m.   General: Well developed, well nourished, NAD Skin: Warm, dry, intact  Head: Normocephalic, atraumatic, clear, moist mucus membranes. Neck: + for carotid bruits. No JVD  Lungs: Clear to ausculation bilaterally. No wheezes, rales, or rhonchi. Breathing is unlabored. Cardiovascular: RRR with S1 S2. No murmurs, rubs, or gallops MSK: Strength and tone appear normal for age. 5/5 in all extremities Extremities: No edema. No clubbing or cyanosis. DP/PT pulses 2+ bilaterally Neuro: Alert and oriented. No focal deficits. No facial asymmetry. MAE spontaneously. Psych: Responds to questions appropriately with normal affect.     EKG:  The EKG was personally reviewed and demonstrates: 12/04/17 Atrial fibrillation, HR 75.  There are non-specific ST and T wave abnormalities, however unchanged from previous tracings.  Telemetry:  Telemetry was personally reviewed and demonstrates: 12/05/17 NSR 63  Relevant CV Studies:  ECHO: 10/31/16: Study Conclusions  - Left ventricle: The cavity size was normal. Wall thickness was   increased in a pattern of mild LVH. Systolic function was normal.   The estimated ejection fraction was in the range of 55% to 60%.   Wall motion was normal; there were no regional wall motion   abnormalities. The study is not technically sufficient to allow   evaluation of LV diastolic function. - Aortic valve: Trileaflet. Sclerosis without stenosis. There was   no regurgitation. - Mitral valve: Systolic bowing without prolapse. There was mild   regurgitation. - Left atrium: Moderately dilated. - Right atrium: The atrium was mildly dilated. - Tricuspid valve: There was mild regurgitation. - Pulmonary arteries: PA peak pressure: 27 mm Hg (S). - Inferior vena cava: The vessel was normal in size. The   respirophasic diameter changes  were in the normal range (>= 50%),   consistent with normal central venous pressure.  Impressions:  - Compared to a prior study in 2016, there are no significant   changes.  CATH:  07/27/2016: 1. Widely patent LAD and diagonal branches with mild nonobstructive disease 2. Continued patency of the stented segment in the distal RCA with mild nonobstructive RCA stenosis, large dominant vessel 3. 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 Normal LV systolic function with LVEF estimated at 55-60%, normal LVEDP  Recommendations: Continued medical therapy and risk reduction measures. Okay to resume apixaban tonight.  Laboratory Data: Chemistry Recent Labs  Lab 12/04/17 1530 12/05/17 0445  NA 137 138  K 3.7 3.1*  CL 104 105  CO2 23 24  GLUCOSE 116* 204*  BUN 18 15  CREATININE 0.99 0.89  CALCIUM 9.2 8.5*  GFRNONAA >60 >60  GFRAA >60 >60  ANIONGAP 10 9    Total Protein  Date Value Ref Range Status  10/23/2017 6.5 6.0 - 8.5 g/dL Final   Albumin  Date Value Ref Range Status  10/23/2017 4.2 3.6 - 4.8 g/dL Final   AST  Date Value Ref Range Status  10/23/2017 22 0 - 40 IU/L Final   ALT  Date Value Ref Range Status  10/23/2017 22 0 - 44 IU/L Final   Alkaline Phosphatase  Date Value Ref Range Status  10/23/2017 61 39 - 117 IU/L Final   Bilirubin Total  Date Value Ref Range Status  10/23/2017 0.6 0.0 - 1.2 mg/dL Final   Hematology Recent Labs  Lab 12/04/17 1530 12/05/17 0445  WBC 6.8 5.9  RBC 5.29 4.79  HGB 16.0 14.3  HCT 46.1 41.6  MCV 87.1 86.8  MCH 30.2 29.9  MCHC 34.7 34.4  RDW 14.1 14.1  PLT 303 259   Cardiac EnzymesNo results for input(s): TROPONINI in the last 168 hours. No results for input(s): TROPIPOC in the last 168 hours.  BNPNo results for input(s): BNP, PROBNP in the last 168 hours.  DDimer No results for input(s): DDIMER in the last 168 hours. TSH:  Lab Results  Component Value Date   TSH 1.480  10/23/2017   Lipids: Lab Results  Component Value Date   CHOL 125 12/05/2017   HDL 40 (L) 12/05/2017   LDLCALC 75 12/05/2017   TRIG 51 12/05/2017   CHOLHDL 3.1 12/05/2017   HgbA1c: Lab Results  Component Value Date   HGBA1C 6.8 (H) 12/05/2017    Radiology/Studies:  Ct Angio Head W Or Wo Contrast  Result Date: 12/04/2017 CLINICAL DATA:  RIGHT-sided upper extremity weakness and visual changes for 1 month. Subtotal RIGHT ICA occlusion. EXAM: CT ANGIOGRAPHY HEAD TECHNIQUE: Multidetector CT imaging of the head was performed using the standard protocol during bolus administration of intravenous contrast. Multiplanar CT image reconstructions and MIPs were obtained to evaluate the vascular anatomy. CONTRAST:  11mL ISOVUE-370 IOPAMIDOL (ISOVUE-370) INJECTION 76% COMPARISON:  BILATERAL carotid duplex ultrasound earlier today FINDINGS: CT HEAD Brain: No evidence of acute infarction, hemorrhage, hydrocephalus, extra-axial collection or mass lesion/mass effect. Normal for age cerebral volume. No definite white matter disease. Tiny perivascular space versus chronic lacunar infarct, RIGHT posterior frontal subcortical white matter Vascular: No hyperdense vessel or unexpected calcification. Skull: Normal. Negative for fracture or focal lesion. Sinuses: No acute findings. Orbits: Unremarkable. CTA HEAD Anterior circulation: No significant stenosis, proximal occlusion, aneurysm, or vascular malformation. Calcific change affects the BILATERAL upper cervical, petrous, cavernous, and supraclinoid internal carotid arteries, without significant narrowing. Posterior circulation: No significant stenosis, proximal occlusion, aneurysm, or vascular malformation. RIGHT vertebral dominant, with LEFT vertebral primarily supplying the posterior inferior cerebellar artery. Distal vertebral calcifications RIGHT V4 segment, nonstenotic. Venous sinuses: As permitted by contrast timing, patent. Anatomic variants: Fetal origin LEFT  PCA.  Hypoplastic RIGHT A1 ACA Delayed phase: No abnormal intracranial enhancement. IMPRESSION: Calcification of the cavernous internal carotid arteries and distal RIGHT vertebral artery consistent with cerebrovascular atherosclerotic disease. No signs of intracranial large vessel occlusion. No findings to suggest acute intracranial ischemia. No abnormal postcontrast enhancement. Electronically Signed   By: Staci Righter M.D.   On: 12/04/2017 20:13   Ct Angio Neck W And/or Wo Contrast  Result Date: 12/05/2017 CLINICAL DATA:  Right carotid stenosis. Scheduled for vascular surgery in 2 days. EXAM: CT ANGIOGRAPHY NECK TECHNIQUE: Multidetector CT imaging of the neck was performed using the standard protocol during bolus administration of intravenous contrast. Multiplanar CT image reconstructions and MIPs were obtained to evaluate the vascular anatomy. Carotid stenosis measurements (when applicable) are obtained utilizing NASCET criteria, using the distal internal carotid diameter as the denominator. CONTRAST:  67mL ISOVUE-370 IOPAMIDOL (ISOVUE-370) INJECTION 76% COMPARISON:  Brain MRI 12/04/2017 CTA head 12/04/2017 FINDINGS: Aortic arch: There is mild calcific atherosclerosis of the aortic arch. There is no aneurysm, dissection or hemodynamically significant stenosis of the visualized ascending aorta and aortic arch. Conventional 3 vessel aortic branching pattern. The visualized proximal subclavian arteries are normal. Right carotid system: The right common carotid origin is widely patent. There is no common carotid or internal carotid artery dissection or aneurysm. Predominantly calcified atherosclerotic plaque at the right carotid bifurcation causes severe stenosis, measuring at least 90% by NASCET criteria. The distal right internal carotid artery is patent. Left carotid system: The left common carotid origin is widely patent. There is no common carotid or internal carotid artery dissection or aneurysm. There is  mixed calcified and noncalcified plaque at the left carotid bifurcation without hemodynamically significant stenosis by  NASCET criteria. Vertebral arteries: The vertebral system is right dominant. Left vertebral artery origin is narrowed by atherosclerotic calcification. There is moderate narrowing of the V2 segment of the right vertebral artery at the level of the C3 vertebral body. There is severe stenosis of the right vertebral artery V3 segment (series 10, image 53). There is mild atherosclerotic calcification of the right V4 segment. Skeleton: There is no bony spinal canal stenosis. No lytic or blastic lesions. Other neck: The nasopharynx is clear. The oropharynx and hypopharynx are normal. The epiglottis is normal. The supraglottic larynx, glottis and subglottic larynx are normal. No retropharyngeal collection. The parapharyngeal spaces are preserved. The parotid and submandibular glands are normal. No sialolithiasis or salivary ductal dilatation. The thyroid gland is normal. There is no cervical lymphadenopathy. Upper chest: No pneumothorax or pleural effusion. No nodules or masses. Review of the MIP images confirms the above findings IMPRESSION: 1. 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 Distal right ICA is patent. 2. 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 3. Left carotid bifurcation atherosclerotic disease without hemodynamically significant stenosis by NASCET criteria. 4.  Aortic Atherosclerosis (ICD10-I70.0). Electronically Signed   By: Ulyses Jarred M.D.   On: 12/05/2017 00:25   Mr Brain Wo Contrast  Result Date: 12/04/2017 CLINICAL DATA:  67 y/o M; 3 1 history of right-sided vision changes and left upper extremity weakness/discoordination. EXAM: MRI HEAD WITHOUT CONTRAST TECHNIQUE: Multiplanar, multiecho pulse sequences of the brain and  surrounding structures were obtained without intravenous contrast. COMPARISON:  12/04/2017 CT of the head. FINDINGS: Brain: Several scattered punctate foci of diffusion hyperintensity are present throughout the right frontal and parietal lobes. Lesions demonstrate variable diffusion on ADC. Foci of infarction demonstrate associated T2 FLAIR hyperintense signal abnormality. Cystic change of a single right posterior frontal lesion without susceptibility blooming (series 8, image 76). No susceptibility abnormality on the SWAN sequence to indicate intracranial hemorrhage. No extra-axial collection, hydrocephalus, or effacement of basilar cisterns. Vascular: Normal flow voids. Skull and upper cervical spine: Normal marrow signal. Sinuses/Orbits: Negative. Other: None. IMPRESSION: Several punctate foci of infarction in the right frontal and parietal lobes of mixed age including recent acute/early subacute infarctions. No hemorrhage or mass effect. Electronically Signed   By: Kristine Garbe M.D.   On: 12/04/2017 21:30   US Carotid Bilateral  Result Date: 12/04/2017 CLINICAL DATA:  Right eye visual disturbance. Coronary artery disease. Peripheral vascular disease. Hypertension, previous tobacco abuse. EXAM: BILATERAL CAROTID DUPLEX ULTRASOUND TECHNIQUE: Pearline Cables scale imaging, color Doppler and duplex ultrasound was performed of bilateral carotid and vertebral arteries in the neck. COMPARISON:  None available TECHNIQUE: Quantification of carotid stenosis is based on velocity parameters that correlate the residual internal carotid diameter with NASCET-based stenosis levels, using the diameter of the distal internal carotid lumen as the denominator for stenosis measurement. The following velocity measurements were obtained: PEAK SYSTOLIC/END DIASTOLIC RIGHT ICA:                     540/239cm/sec CCA:                     97/67YO/VZC SYSTOLIC ICA/CCA RATIO:  5.5 DIASTOLIC ICA/CCA RATIO: 29 ECA:                      132/17cm/sec LEFT ICA:  97/31cm/sec CCA:                     31/51VO/HYW SYSTOLIC ICA/CCA RATIO:  1.5 DIASTOLIC ICA/CCA RATIO: 2.4 ECA:                     130/15cm/sec FINDINGS: RIGHT CAROTID ARTERY: Calcified plaque in the distal common carotid artery and circumferential in the bulb. Plaque extends into the proximal ICA. There is a short segment high-grade stenosis with focal aliasing on color Doppler interrogation. Markedly elevated peak systolic velocities and elevated diastolic velocities just distal to the lesion. Elsewhere normal waveforms and color Doppler signal. RIGHT VERTEBRAL ARTERY:  Normal flow direction and waveform. LEFT CAROTID ARTERY: Partially calcified circumferential plaque in the bulb extending into proximal internal and external carotid arteries. Normal waveforms and color Doppler signal throughout however. LEFT VERTEBRAL ARTERY: Normal flow direction and waveform. IMPRESSION: 1. Critical near-occlusive proximal right ICA short-segment stenosis. Vascular surgical or neurointerventional radiology consultation recommended. 2. Left carotid plaque resulting in less than 50% stenosis. 3.  Antegrade bilateral vertebral arterial flow. These results will be called to the ordering clinician or representative by the Radiologist Assistant, and communication documented in the PACS or zVision Dashboard. Electronically Signed   By: Lucrezia Europe M.D.   On: 12/04/2017 12:32    Assessment and Plan:   1.  Chronic atrial fibrillation: -Eliquis currently on hold secondary to planned procedure on 12/06/2017 -Heparin gtt per pharmacy  -Heart rate per telemetry review, Afib ranging in the 60-70's -c/o chronic SOB thought to be related to his atrial fibrillation given that his last cardiac workup was negative for significant ischemic changes -Pt not interested in Tikosyn/Sotalol therapy secondary to side effects  -CHA2DS2VASc =4, 8.5% risk of event per year  2.  Known CAD: -Denies  anginal pain -EKG with TWI and ST segment abnormalities, consistent with previous EKG tracings -Last cardiac cath on 07/27/2016 with widely patent LAD and diagonal branches with mild nonobstructive disease, continued patency of the stented segment of the distal RCA with mild nonobstructive RCA stenosis, chronic total occlusion of the small left circumflex with right-to-left collaterals and normal LVEF estimated at 55-60%.  Recommendations at that time were medical therapy and risk reduction measures -Not on daily ASA, BB? Once cleared through primary team/neurology secondary to recent acute/early subacute infarctions per MRI report 12/04/17 -Statin  3.  Essential hypertension: -Elevated, 154/86, 163/82, 133/82 -Permissive hypertension in setting of stroke -Norvasc, HCTZ and losartan on hold per primary  4.  HLD: -Stable, continue Lipitor -Pending lipid panel  5.  DM 2: -SSI per primary -Hemoglobin A1c, 6.8   For questions or updates, please contact [place] Please consult [email or web address] for contact info under Cardiology/STEMI.   SignedKathyrn Drown NP-C [facility] Pager: [phone] 12/05/2017 3:21 PM

## 2017-12-05 NOTE — Progress Notes (Signed)
ANTICOAGULATION CONSULT NOTE  Pharmacy Consult for heparin Indication: atrial fibrillation  Patient Measurements: Height: 5\' 10"  (177.8 cm) Weight: 192 lb 8 oz (87.3 kg) IBW/kg (Calculated) : 73 Heparin Dosing Weight: 87.3 kg   Labs: Recent Labs    12/04/17 1530 12/05/17 0445 12/05/17 1336 12/05/17 2214  HGB 16.0 14.3  --   --   HCT 46.1 41.6  --   --   PLT 303 259  --   --   APTT 35 42* 62* 93*  LABPROT 14.7  --   --   --   INR 1.15  --   --   --   HEPARINUNFRC  --  0.70  --   --   CREATININE 0.99 0.89  --   --     Assessment: 67 y.o. male with h/o Afib, Eliquis on hold, for heparin  Goal of Therapy:  aPTT 66-102 seconds   Plan:  Continue Heparin at current rate  Phillis Knack, PharmD, BCPS  12/05/2017,11:35 PM

## 2017-12-06 ENCOUNTER — Inpatient Hospital Stay (HOSPITAL_COMMUNITY): Payer: BLUE CROSS/BLUE SHIELD | Admitting: Anesthesiology

## 2017-12-06 ENCOUNTER — Other Ambulatory Visit (HOSPITAL_COMMUNITY): Payer: BLUE CROSS/BLUE SHIELD

## 2017-12-06 ENCOUNTER — Encounter (HOSPITAL_COMMUNITY): Payer: Self-pay | Admitting: *Deleted

## 2017-12-06 ENCOUNTER — Encounter (HOSPITAL_COMMUNITY)
Admission: EM | Disposition: A | Discharge status: Home / Self Care | Payer: Self-pay | Source: Home / Self Care | Attending: Internal Medicine

## 2017-12-06 HISTORY — PX: ENDARTERECTOMY: SHX5162

## 2017-12-06 HISTORY — PX: PATCH ANGIOPLASTY: SHX6230

## 2017-12-06 LAB — CBC
HCT: 43.2 % (ref 39.0–52.0)
HEMOGLOBIN: 15 g/dL (ref 13.0–17.0)
MCH: 29.7 pg (ref 26.0–34.0)
MCHC: 34.7 g/dL (ref 30.0–36.0)
MCV: 85.5 fL (ref 78.0–100.0)
Platelets: 276 10*3/uL (ref 150–400)
RBC: 5.05 MIL/uL (ref 4.22–5.81)
RDW: 13.5 % (ref 11.5–15.5)
WBC: 8.3 10*3/uL (ref 4.0–10.5)

## 2017-12-06 LAB — APTT: aPTT: 103 seconds — ABNORMAL HIGH (ref 24–36)

## 2017-12-06 LAB — GLUCOSE, CAPILLARY
Glucose-Capillary: 143 mg/dL — ABNORMAL HIGH (ref 65–99)
Glucose-Capillary: 184 mg/dL — ABNORMAL HIGH (ref 65–99)
Glucose-Capillary: 207 mg/dL — ABNORMAL HIGH (ref 65–99)

## 2017-12-06 LAB — BASIC METABOLIC PANEL
ANION GAP: 10 (ref 5–15)
BUN: 9 mg/dL (ref 6–20)
CO2: 23 mmol/L (ref 22–32)
Calcium: 8.6 mg/dL — ABNORMAL LOW (ref 8.9–10.3)
Chloride: 106 mmol/L (ref 101–111)
Creatinine, Ser: 0.85 mg/dL (ref 0.61–1.24)
Glucose, Bld: 133 mg/dL — ABNORMAL HIGH (ref 65–99)
POTASSIUM: 3.1 mmol/L — AB (ref 3.5–5.1)
SODIUM: 139 mmol/L (ref 135–145)

## 2017-12-06 LAB — PROTIME-INR
INR: 1.12
Prothrombin Time: 14.3 seconds (ref 11.4–15.2)

## 2017-12-06 LAB — HEPARIN LEVEL (UNFRACTIONATED): HEPARIN UNFRACTIONATED: 0.47 [IU]/mL (ref 0.30–0.70)

## 2017-12-06 SURGERY — ENDARTERECTOMY, CAROTID
Anesthesia: General | Site: Neck | Laterality: Right

## 2017-12-06 MED ORDER — HYDROMORPHONE HCL 1 MG/ML IJ SOLN
INTRAMUSCULAR | Status: AC
  Filled 2017-12-06: qty 1

## 2017-12-06 MED ORDER — PROPOFOL 10 MG/ML IV BOLUS
INTRAVENOUS | Status: DC | PRN
Start: 2017-12-06 — End: 2017-12-06
  Administered 2017-12-06: 200 mg via INTRAVENOUS

## 2017-12-06 MED ORDER — OXYCODONE HCL 5 MG/5ML PO SOLN: 5.0000 mg | Freq: Once | ORAL | Status: AC | PRN

## 2017-12-06 MED ORDER — ONDANSETRON HCL 4 MG/2ML IJ SOLN
INTRAMUSCULAR | Status: AC
  Filled 2017-12-06: qty 4

## 2017-12-06 MED ORDER — LACTATED RINGERS IV SOLN
INTRAVENOUS | Status: DC
  Administered 2017-12-06 (×2): via INTRAVENOUS

## 2017-12-06 MED ORDER — DEXAMETHASONE SODIUM PHOSPHATE 10 MG/ML IJ SOLN
INTRAMUSCULAR | Status: DC | PRN
  Administered 2017-12-06: 10 mg via INTRAVENOUS

## 2017-12-06 MED ORDER — HYDROMORPHONE HCL 1 MG/ML IJ SOLN
0.2500 mg | INTRAMUSCULAR | Status: DC | PRN
  Administered 2017-12-06: 0.25 mg via INTRAVENOUS

## 2017-12-06 MED ORDER — HEPARIN SODIUM (PORCINE) 1000 UNIT/ML IJ SOLN
INTRAMUSCULAR | Status: DC | PRN
  Administered 2017-12-06: 200 [IU] via INTRAVENOUS
  Administered 2017-12-06: 9000 [IU] via INTRAVENOUS
  Administered 2017-12-06: 3000 [IU] via INTRAVENOUS

## 2017-12-06 MED ORDER — FENTANYL CITRATE (PF) 250 MCG/5ML IJ SOLN
INTRAMUSCULAR | Status: AC
  Filled 2017-12-06: qty 5

## 2017-12-06 MED ORDER — HYDRALAZINE HCL 20 MG/ML IJ SOLN: 5.0000 mg | INTRAMUSCULAR | Status: DC | PRN

## 2017-12-06 MED ORDER — DEXAMETHASONE SODIUM PHOSPHATE 10 MG/ML IJ SOLN
INTRAMUSCULAR | Status: AC
  Filled 2017-12-06: qty 1

## 2017-12-06 MED ORDER — MAGNESIUM SULFATE 2 GM/50ML IV SOLN
2.0000 g | Freq: Once | INTRAVENOUS | Status: AC
  Administered 2017-12-06: 2 g via INTRAVENOUS
  Filled 2017-12-06: qty 50

## 2017-12-06 MED ORDER — SODIUM CHLORIDE 0.9 % IV SOLN: INTRAVENOUS | Status: DC

## 2017-12-06 MED ORDER — LABETALOL HCL 5 MG/ML IV SOLN: 10.0000 mg | INTRAVENOUS | Status: DC | PRN

## 2017-12-06 MED ORDER — MIDAZOLAM HCL 2 MG/2ML IJ SOLN
INTRAMUSCULAR | Status: AC
  Filled 2017-12-06: qty 2

## 2017-12-06 MED ORDER — OXYCODONE HCL 5 MG PO TABS
5.0000 mg | ORAL_TABLET | Freq: Once | ORAL | Status: AC | PRN
  Administered 2017-12-06: 5 mg via ORAL

## 2017-12-06 MED ORDER — PROTAMINE SULFATE 10 MG/ML IV SOLN
INTRAVENOUS | Status: DC | PRN
  Administered 2017-12-06 (×2): 20 mg via INTRAVENOUS
  Administered 2017-12-06: 10 mg via INTRAVENOUS

## 2017-12-06 MED ORDER — POTASSIUM CHLORIDE 10 MEQ/100ML IV SOLN
10.0000 meq | INTRAVENOUS | Status: AC
  Administered 2017-12-06: 10 meq via INTRAVENOUS
  Filled 2017-12-06: qty 100

## 2017-12-06 MED ORDER — ONDANSETRON HCL 4 MG/2ML IJ SOLN
INTRAMUSCULAR | Status: DC | PRN
  Administered 2017-12-06: 4 mg via INTRAVENOUS

## 2017-12-06 MED ORDER — LIDOCAINE HCL (PF) 1 % IJ SOLN
INTRAMUSCULAR | Status: AC
  Filled 2017-12-06: qty 30

## 2017-12-06 MED ORDER — POTASSIUM CHLORIDE CRYS ER 20 MEQ PO TBCR: 20.0000 meq | EXTENDED_RELEASE_TABLET | Freq: Every day | ORAL | Status: DC | PRN

## 2017-12-06 MED ORDER — SODIUM CHLORIDE 0.9 % IV SOLN
INTRAVENOUS | Status: DC | PRN
  Administered 2017-12-06: 500 mL

## 2017-12-06 MED ORDER — ASPIRIN EC 325 MG PO TBEC
325.0000 mg | DELAYED_RELEASE_TABLET | Freq: Every day | ORAL | Status: DC
  Administered 2017-12-06 – 2017-12-07 (×2): 325 mg via ORAL
  Filled 2017-12-06 (×2): qty 1

## 2017-12-06 MED ORDER — ARTIFICIAL TEARS OPHTHALMIC OINT
TOPICAL_OINTMENT | OPHTHALMIC | Status: AC
  Filled 2017-12-06: qty 3.5

## 2017-12-06 MED ORDER — MORPHINE SULFATE (PF) 4 MG/ML IV SOLN: 4.0000 mg | INTRAVENOUS | Status: DC | PRN

## 2017-12-06 MED ORDER — EPHEDRINE SULFATE 50 MG/ML IJ SOLN
INTRAMUSCULAR | Status: DC | PRN
  Administered 2017-12-06: 2.5 mg via INTRAVENOUS

## 2017-12-06 MED ORDER — OXYCODONE HCL 5 MG PO TABS
ORAL_TABLET | ORAL | Status: AC
  Filled 2017-12-06: qty 1

## 2017-12-06 MED ORDER — METOPROLOL TARTRATE 5 MG/5ML IV SOLN: 2.0000 mg | INTRAVENOUS | Status: DC | PRN

## 2017-12-06 MED ORDER — FENTANYL CITRATE (PF) 250 MCG/5ML IJ SOLN
INTRAMUSCULAR | Status: DC | PRN
  Administered 2017-12-06: 100 ug via INTRAVENOUS
  Administered 2017-12-06 (×2): 50 ug via INTRAVENOUS
  Administered 2017-12-06: 100 ug via INTRAVENOUS
  Administered 2017-12-06 (×2): 50 ug via INTRAVENOUS

## 2017-12-06 MED ORDER — LIDOCAINE HCL (CARDIAC) 20 MG/ML IV SOLN
INTRAVENOUS | Status: AC
  Filled 2017-12-06: qty 5

## 2017-12-06 MED ORDER — GUAIFENESIN-DM 100-10 MG/5ML PO SYRP: 15.0000 mL | ORAL_SOLUTION | ORAL | Status: DC | PRN

## 2017-12-06 MED ORDER — ROCURONIUM BROMIDE 10 MG/ML (PF) SYRINGE
PREFILLED_SYRINGE | INTRAVENOUS | Status: AC
  Filled 2017-12-06: qty 5

## 2017-12-06 MED ORDER — SODIUM CHLORIDE 0.9 % IV SOLN
500.0000 mL | Freq: Once | INTRAVENOUS | Status: AC | PRN
  Administered 2017-12-06: 500 mL via INTRAVENOUS

## 2017-12-06 MED ORDER — GLYCOPYRROLATE 0.2 MG/ML IV SOSY
PREFILLED_SYRINGE | INTRAVENOUS | Status: DC | PRN
  Administered 2017-12-06: .2 mg via INTRAVENOUS

## 2017-12-06 MED ORDER — PROTAMINE SULFATE 10 MG/ML IV SOLN
INTRAVENOUS | Status: AC
  Filled 2017-12-06: qty 5

## 2017-12-06 MED ORDER — DEXAMETHASONE SODIUM PHOSPHATE 10 MG/ML IJ SOLN
INTRAMUSCULAR | Status: AC
  Filled 2017-12-06: qty 2

## 2017-12-06 MED ORDER — DEXTRAN 40 IN D5W 10 % IV SOLN
INTRAVENOUS | Status: AC
  Filled 2017-12-06: qty 500

## 2017-12-06 MED ORDER — HEMOSTATIC AGENTS (NO CHARGE) OPTIME
TOPICAL | Status: DC | PRN
  Administered 2017-12-06: 1 via TOPICAL

## 2017-12-06 MED ORDER — ROCURONIUM BROMIDE 10 MG/ML (PF) SYRINGE
PREFILLED_SYRINGE | INTRAVENOUS | Status: DC | PRN
  Administered 2017-12-06: 30 mg via INTRAVENOUS
  Administered 2017-12-06 (×2): 50 mg via INTRAVENOUS

## 2017-12-06 MED ORDER — PROPOFOL 10 MG/ML IV BOLUS
INTRAVENOUS | Status: AC
  Filled 2017-12-06: qty 20

## 2017-12-06 MED ORDER — SUGAMMADEX SODIUM 500 MG/5ML IV SOLN
INTRAVENOUS | Status: AC
  Filled 2017-12-06: qty 5

## 2017-12-06 MED ORDER — MIDAZOLAM HCL 5 MG/5ML IJ SOLN
INTRAMUSCULAR | Status: DC | PRN
  Administered 2017-12-06: 1 mg via INTRAVENOUS

## 2017-12-06 MED ORDER — DEXTRAN 40 IN D5W 10 % IV SOLN
INTRAVENOUS | Status: AC | PRN
  Administered 2017-12-06: 500 mL

## 2017-12-06 MED ORDER — SUGAMMADEX SODIUM 200 MG/2ML IV SOLN
INTRAVENOUS | Status: DC | PRN
  Administered 2017-12-06: 300 mg via INTRAVENOUS

## 2017-12-06 MED ORDER — HEPARIN SODIUM (PORCINE) 1000 UNIT/ML IJ SOLN
INTRAMUSCULAR | Status: AC
  Filled 2017-12-06: qty 2

## 2017-12-06 MED ORDER — PROMETHAZINE HCL 25 MG/ML IJ SOLN: 6.2500 mg | INTRAMUSCULAR | Status: DC | PRN

## 2017-12-06 MED ORDER — ROCURONIUM BROMIDE 10 MG/ML (PF) SYRINGE
PREFILLED_SYRINGE | INTRAVENOUS | Status: AC
  Filled 2017-12-06: qty 15

## 2017-12-06 MED ORDER — PHENOL 1.4 % MT LIQD: 1.0000 | OROMUCOSAL | Status: DC | PRN

## 2017-12-06 MED ORDER — PHENYLEPHRINE HCL 10 MG/ML IJ SOLN
INTRAVENOUS | Status: DC | PRN
  Administered 2017-12-06: 25 ug/min via INTRAVENOUS

## 2017-12-06 MED ORDER — ONDANSETRON HCL 4 MG/2ML IJ SOLN: 4.0000 mg | Freq: Four times a day (QID) | INTRAMUSCULAR | Status: DC | PRN

## 2017-12-06 MED ORDER — 0.9 % SODIUM CHLORIDE (POUR BTL) OPTIME
TOPICAL | Status: DC | PRN
  Administered 2017-12-06 (×2): 1000 mL

## 2017-12-06 MED ORDER — HEPARIN SODIUM (PORCINE) 5000 UNIT/ML IJ SOLN
5000.0000 [IU] | Freq: Three times a day (TID) | INTRAMUSCULAR | Status: DC
  Administered 2017-12-07: 5000 [IU] via SUBCUTANEOUS
  Filled 2017-12-06: qty 1

## 2017-12-06 MED ORDER — EPHEDRINE 5 MG/ML INJ
INTRAVENOUS | Status: AC
  Filled 2017-12-06: qty 10

## 2017-12-06 MED ORDER — GLYCOPYRROLATE 0.2 MG/ML IJ SOLN
INTRAMUSCULAR | Status: DC | PRN
  Administered 2017-12-06: 0.2 mg via INTRAVENOUS

## 2017-12-06 SURGICAL SUPPLY — 44 items
CANISTER SUCT 3000ML PPV (MISCELLANEOUS) ×3 IMPLANT
CATH ROBINSON RED A/P 18FR (CATHETERS) ×3 IMPLANT
CATH SUCT 10FR WHISTLE TIP (CATHETERS) ×3 IMPLANT
CLIP VESOCCLUDE MED 6/CT (CLIP) ×3 IMPLANT
CLIP VESOCCLUDE SM WIDE 6/CT (CLIP) ×3 IMPLANT
COVER PROBE W GEL 5X96 (DRAPES) ×6 IMPLANT
CRADLE DONUT ADULT HEAD (MISCELLANEOUS) ×3 IMPLANT
DERMABOND ADVANCED (GAUZE/BANDAGES/DRESSINGS) ×2
DERMABOND ADVANCED .7 DNX12 (GAUZE/BANDAGES/DRESSINGS) ×1 IMPLANT
DRAIN CHANNEL 15F RND FF W/TCR (WOUND CARE) IMPLANT
ELECT REM PT RETURN 9FT ADLT (ELECTROSURGICAL) ×3
ELECTRODE REM PT RTRN 9FT ADLT (ELECTROSURGICAL) ×1 IMPLANT
EVACUATOR SILICONE 100CC (DRAIN) IMPLANT
GLOVE BIOGEL PI IND STRL 7.5 (GLOVE) ×1 IMPLANT
GLOVE BIOGEL PI INDICATOR 7.5 (GLOVE) ×2
GLOVE SURG SS PI 7.5 STRL IVOR (GLOVE) ×3 IMPLANT
GOWN STRL REUS W/ TWL LRG LVL3 (GOWN DISPOSABLE) ×2 IMPLANT
GOWN STRL REUS W/ TWL XL LVL3 (GOWN DISPOSABLE) ×1 IMPLANT
GOWN STRL REUS W/TWL LRG LVL3 (GOWN DISPOSABLE) ×4
GOWN STRL REUS W/TWL XL LVL3 (GOWN DISPOSABLE) ×2
HEMOSTAT SNOW SURGICEL 2X4 (HEMOSTASIS) ×3 IMPLANT
INSERT FOGARTY SM (MISCELLANEOUS) IMPLANT
KIT BASIN OR (CUSTOM PROCEDURE TRAY) ×3 IMPLANT
KIT ROOM TURNOVER OR (KITS) ×3 IMPLANT
KIT SHUNT ARGYLE CAROTID ART 6 (VASCULAR PRODUCTS) IMPLANT
NEEDLE HYPO 25GX1X1/2 BEV (NEEDLE) IMPLANT
NS IRRIG 1000ML POUR BTL (IV SOLUTION) ×9 IMPLANT
PACK CAROTID (CUSTOM PROCEDURE TRAY) ×3 IMPLANT
PAD ARMBOARD 7.5X6 YLW CONV (MISCELLANEOUS) ×6 IMPLANT
PATCH VASC XENOSURE 1CMX6CM (Vascular Products) ×2 IMPLANT
PATCH VASC XENOSURE 1X6 (Vascular Products) ×1 IMPLANT
SHUNT CAROTID BYPASS 10 (VASCULAR PRODUCTS) IMPLANT
SHUNT CAROTID BYPASS 12FRX15.5 (VASCULAR PRODUCTS) IMPLANT
SUT ETHILON 3 0 PS 1 (SUTURE) IMPLANT
SUT PROLENE 6 0 BV (SUTURE) ×6 IMPLANT
SUT PROLENE 7 0 BV 1 (SUTURE) IMPLANT
SUT SILK 3 0 (SUTURE)
SUT SILK 3-0 18XBRD TIE 12 (SUTURE) IMPLANT
SUT VIC AB 3-0 SH 27 (SUTURE) ×6
SUT VIC AB 3-0 SH 27X BRD (SUTURE) ×3 IMPLANT
SUT VICRYL 4-0 PS2 18IN ABS (SUTURE) ×3 IMPLANT
SYR CONTROL 10ML LL (SYRINGE) IMPLANT
TOWEL GREEN STERILE (TOWEL DISPOSABLE) ×3 IMPLANT
WATER STERILE IRR 1000ML POUR (IV SOLUTION) ×3 IMPLANT

## 2017-12-06 NOTE — Anesthesia Preprocedure Evaluation (Addendum)
Anesthesia Evaluation  Patient identified by MRN, date of birth, ID band Patient awake    Reviewed: Allergy & Precautions, NPO status , Patient's Chart, lab work & pertinent test results  Airway Mallampati: III  TM Distance: >3 FB Neck ROM: Full    Dental no notable dental hx.    Pulmonary Current Smoker,    Pulmonary exam normal breath sounds clear to auscultation       Cardiovascular Exercise Tolerance: Good hypertension, Pt. on medications + CAD, + Past MI and + Cardiac Stents (x 1 in 2008)  Normal cardiovascular exam+ dysrhythmias Atrial Fibrillation  Rhythm:Regular Rate:Normal  ECG: a-fib, RBBB, rate 75  Pre-op eval per cardiology Radford Pax)  07/27/2016: 1. Widely patent LAD and diagonal branches with mild nonobstructive disease 2. Continued patency of the stented segment in the distal RCA with mild nonobstructive RCA stenosis, large dominant vessel 3. 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 Normal LV systolic function with LVEF estimated at 55-60%, normal LVEDP     Neuro/Psych negative neurological ROS  negative psych ROS   GI/Hepatic Neg liver ROS, IBS (irritable bowel syndrome)   Endo/Other  negative endocrine ROS  Renal/GU negative Renal ROS     Musculoskeletal negative musculoskeletal ROS (+)   Abdominal   Peds  Hematology HLD   Anesthesia Other Findings RIGHT CAROTID STENOSIS  Reproductive/Obstetrics                            Anesthesia Physical Anesthesia Plan  ASA: IV  Anesthesia Plan: General   Post-op Pain Management:    Induction: Intravenous  PONV Risk Score and Plan: 1 and Ondansetron, Dexamethasone and Treatment may vary due to age or medical condition  Airway Management Planned: Oral ETT  Additional Equipment: Arterial line  Intra-op Plan:   Post-operative Plan: Extubation in OR  Informed Consent: I have  reviewed the patients History and Physical, chart, labs and discussed the procedure including the risks, benefits and alternatives for the proposed anesthesia with the patient or authorized representative who has indicated his/her understanding and acceptance.   Dental advisory given  Plan Discussed with: CRNA  Anesthesia Plan Comments:         Anesthesia Quick Evaluation

## 2017-12-06 NOTE — Progress Notes (Signed)
12/06/2017 1840 Received pt to room 4E-25 from PACU.  Pt is A&O, no C/O voiced.  Tele monitor applied and CCMD notified.  CHG bath complete.  Oriented to room, call light and bed.  Call bell in reach and family at bedside.   [place]

## 2017-12-06 NOTE — Anesthesia Postprocedure Evaluation (Signed)
Anesthesia Post Note  Patient: Scott [name]  Procedure(s) Performed: ENDARTERECTOMY CAROTID RIGHT (Right Neck) PATCH ANGIOPLASTY RIGHT CAROTID ARTERY (Right Neck)     Patient location during evaluation: PACU Anesthesia Type: General Level of consciousness: awake and alert Pain management: pain level controlled Vital Signs Assessment: post-procedure vital signs reviewed and stable Respiratory status: spontaneous breathing, nonlabored ventilation, respiratory function stable and patient connected to nasal cannula oxygen Cardiovascular status: blood pressure returned to baseline and stable Postop Assessment: no apparent nausea or vomiting Anesthetic complications: no    Last Vitals:  Vitals:   12/06/17 0921 12/06/17 1500  BP: (!) 154/103 (!) 152/99  Pulse:  96  Resp:  18  Temp:  37.3 C  SpO2:  100%    Last Pain:  Vitals:   12/06/17 0546  TempSrc: Oral  PainSc:                  ,

## 2017-12-06 NOTE — Anesthesia Postprocedure Evaluation (Signed)
Anesthesia Post Note  Patient: Scott [name]  Procedure(s) Performed: ENDARTERECTOMY CAROTID RIGHT (Right Neck) PATCH ANGIOPLASTY RIGHT CAROTID ARTERY (Right Neck)     Anesthesia Type: General    Last Vitals:  Vitals:   12/06/17 0921 12/06/17 1500  BP: (!) 154/103 (!) 152/99  Pulse:  96  Resp:  18  Temp:  37.3 C  SpO2:  100%    Last Pain:  Vitals:   12/06/17 0546  TempSrc: Oral  PainSc:                  ,

## 2017-12-06 NOTE — Anesthesia Procedure Notes (Signed)
Procedure Name: Intubation Date/Time: 12/06/2017 11:34 AM Performed by: Mariea Clonts, CRNA Pre-anesthesia Checklist: Patient identified, Emergency Drugs available, Suction available and Patient being monitored Patient Re-evaluated:Patient Re-evaluated prior to induction Oxygen Delivery Method: Circle System Utilized Preoxygenation: Pre-oxygenation with 100% oxygen Induction Type: IV induction Ventilation: Mask ventilation without difficulty, Oral airway inserted - appropriate to patient size and Mask ventilation throughout procedure Laryngoscope Size: Miller and 2 Grade View: Grade II Tube type: Oral Number of attempts: 1 Airway Equipment and Method: Stylet and Oral airway Placement Confirmation: ETT inserted through vocal cords under direct vision,  positive ETCO2 and breath sounds checked- equal and bilateral Tube secured with: Tape Dental Injury: Teeth and Oropharynx as per pre-operative assessment

## 2017-12-06 NOTE — Progress Notes (Signed)
Patient sleeping at this time, has been ambulating to the bathroom w/o problems and has not complained of pain.  I will keep monitoring patient.

## 2017-12-06 NOTE — Progress Notes (Signed)
[place] for heparin Indication: atrial fibrillation  Patient Measurements: Height: 5\' 10"  (177.8 cm) Weight: 192 lb 8 oz (87.3 kg) IBW/kg (Calculated) : 73 Heparin Dosing Weight: 87.3 kg   Labs: Recent Labs    12/04/17 1530 12/05/17 0445 12/05/17 1336 12/05/17 2214 12/06/17 0636 12/06/17 0637  HGB 16.0 14.3  --   --  15.0  --   HCT 46.1 41.6  --   --  43.2  --   PLT 303 259  --   --  276  --   APTT 35 42* 62* 93*  --   --   LABPROT 14.7  --   --   --  14.3  --   INR 1.15  --   --   --  1.12  --   HEPARINUNFRC  --  0.70  --   --   --  0.47  CREATININE 0.99 0.89  --   --  0.85  --     Assessment: 67 y.o. male with h/o Afib, Eliquis on hold, on IV heparin 1500 units/hr. APTT =103 and heparin level =0.47.  APTT just slightly above goal,  pta apixaban  last taken 12/04/17 at 10am. CBC wnl.  No bleeding reported Heparin drip stopped at 09:28 in, going to OR this AM for right CEA   Goal of Therapy:  aPTT 66-102 seconds  Heparin level 0.3-0.7 units/ml  Monitor platelet count    Plan:  F/u post CEA for restart of IV heparin vs. apixaban.    Thank you for allowing pharmacy to be part of this patients care team. Nicole Cella, [place] Clinical Pharmacist Pager: [phone] or [phone] ([id number]) Main Rx [phone] 12/06/2017,9:30 AM

## 2017-12-06 NOTE — Interval H&P Note (Signed)
History and Physical Interval Note:  12/06/2017 10:26 AM  Scott [name]  has presented today for surgery, with the diagnosis of RIGHT CAROTID STENOSIS  The various methods of treatment have been discussed with the patient and family. After consideration of risks, benefits and other options for treatment, the patient has consented to  Procedure(s): ENDARTERECTOMY CAROTID RIGHT (Right) as a surgical intervention .  [name] history has been reviewed, patient examined, no change in status, stable for surgery.  I have reviewed the patient's chart and labs.  Questions were answered to the patient's satisfaction.     Annamarie Major

## 2017-12-06 NOTE — Anesthesia Procedure Notes (Signed)
Arterial Line Insertion Start/End3/15/2019 10:30 AM, 12/06/2017 10:40 AM Performed by: Leonor Liv, CRNA, CRNA  Patient location: Pre-op. Preanesthetic checklist: patient identified, IV checked, site marked, risks and benefits discussed, surgical consent, monitors and equipment checked, pre-op evaluation, timeout performed and anesthesia consent Lidocaine 1% used for infiltration Left, radial was placed Catheter size: 20 G Hand hygiene performed , maximum sterile barriers used  and Seldinger technique used Allen's test indicative of satisfactory collateral circulation Attempts: 1 Procedure performed without using ultrasound guided technique. Following insertion, dressing applied and Biopatch. Post procedure assessment: normal  Patient tolerated the procedure well with no immediate complications.

## 2017-12-06 NOTE — Progress Notes (Signed)
PROGRESS NOTE    Scott [name]  XKG:[id number] DOB: 1951/05/09 DOA: 12/04/2017 PCP: Scott Frees, MD    Brief Narrative by Dr Tamala Julian: " Scott [name] is a 67 y.o. male with medical history significant of HTN, HLD, A. fib on Eliquis, CAD, and PVD s/p right superficial femoral artery stenting in 01/2017 by Dr. Oneida Alar; who presents after having abnormal doppler ultrasound of the carotids today which revealed signs of right carotid artery occlusion at [facility] imaging earlier the day of admission.  Patient reports that for the last several weeks he has had at least 8-10 episodes where he reports right eye vision loss that he states is kind of like a veil coming over his eyes.  Symptoms last 35-40 seconds and then self resolve.  The last such episode occurred with these complaints he reports associated symptoms of  intermittent left hand weakness and/or discoordination intimately dropping things, lip tingling, fatigue, intermittent shortness of breath, and generalized leg and tiredness.  1-2 weeks ago.  He was seen by the ophthalmologist day of admission and noted to have an a relatively normal exam.  The leg fatigue symptoms he reports are not like when he required s 5/tent placement back in 01/2017.  Denies having any leg swelling, calf pain, at this time he reports still smoking three fourths a pack of cigarettes per day on average but he is trying to cut back with a vapor pen".  ED Course: On admission into the emergency department patient was seen to be afebrile, pulse 62-117, respirations 16, blood pressure 155/86-169/96, and O2 saturations 94-99% on room air.  Labs including CBC, BMP, PT, aPTT were unremarkable.  Vascular surgery was consulted and recommended holding Eliquis and starting the patient on heparin in the morning for possible surgical intervention to tentatively set for 3/15.    Assessment & Plan:   Principal Problem:   Internal carotid artery stenosis, right Active Problems:   Atrial fibrillation (HCC)   Essential hypertension   Hyperlipidemia   Arthritis   Tobacco use disorder   Stenosis of right carotid artery   Coronary artery disease due to lipid rich plaque   Preoperative cardiovascular examination   1-Right Carotid Occlusion;  CTA showed severe at least 90 % stenosis of proximal right internal carotid artery.  Now on heparin Gtt.  Plan for carotid endarterectomy  Dr [name] on 3-15. Clear by cardiology. 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  He was on eliquis prior to admission.  He was started on heparin per vascular recommendation.  Neurology consulted.   Lipid panel  LDL 75 and Hb-A1c 6.8  Permissive Hypertension. Hold Norvasc, HCTZ and losartan. ECHO; pending   DM; new diagnosis.  Hb A1 c at 6.8. SSI   HTN; permissive HTN in setting of stroke.   Hypokalemia; replete IV   Afib;  Holding eliquis.  Continue with heparin.   DVT prophylaxis: Heparin  Code Status: Full code.  Family Communication: Care discussed with patient Disposition Plan: sx 3-15 Consultants:   Vascular.   Cardiology  Neurology   Procedures:  None   Antimicrobials: none   Subjective: He denies chest pain or dyspnea.   Objective: Vitals:   12/05/17 1826 12/05/17 2100 12/05/17 2228 12/06/17 0546  BP: 136/89 (!) 167/88    Pulse: 65 60  70  Resp:  18  18  Temp: (!) 97.5 F (36.4 C) 98.1 F (36.7 C)  98.5 F (36.9 C)  TempSrc: Oral  Oral  Oral  SpO2: 100% 100% 100% 100%  Weight:      Height:        Intake/Output Summary (Last 24 hours) at 12/06/2017 0755 Last data filed at 12/06/2017 0600 Gross per 24 hour  Intake 1124.58 ml  Output 1225 ml  Net -100.42 ml   Filed Weights   12/04/17 1521 12/05/17 0102 12/05/17 0609  Weight: 84.8 kg (187 lb) 87.3 kg (192 lb 8 oz) 87.3 kg (192 lb 8 oz)    Examination:  General exam: NAD Respiratory system: CTA Cardiovascular system: S 1, S2 RRR Gastrointestinal  system: BS present, soft, nt Central nervous system: non focal.  Extremities:  Symmetric power  Skin: no rash     Data Reviewed: I have personally reviewed following labs and imaging studies  CBC: Recent Labs  Lab 12/04/17 1530 12/05/17 0445 12/06/17 0636  WBC 6.8 5.9 8.3  NEUTROABS 4.2 2.1  --   HGB 16.0 14.3 15.0  HCT 46.1 41.6 43.2  MCV 87.1 86.8 85.5  PLT 303 259 810   Basic Metabolic Panel: Recent Labs  Lab 12/04/17 1530 12/05/17 0445 12/06/17 0636  NA 137 138 139  K 3.7 3.1* 3.1*  CL 104 105 106  CO2 23 24 23   GLUCOSE 116* 204* 133*  BUN 18 15 9   CREATININE 0.99 0.89 0.85  CALCIUM 9.2 8.5* 8.6*  MG  --  1.7  --    GFR: Estimated Creatinine Clearance: 90.7 mL/min (by C-G formula based on SCr of 0.85 mg/dL). Liver Function Tests: No results for input(s): AST, ALT, ALKPHOS, BILITOT, PROT, ALBUMIN in the last 168 hours. No results for input(s): LIPASE, AMYLASE in the last 168 hours. No results for input(s): AMMONIA in the last 168 hours. Coagulation Profile: Recent Labs  Lab 12/04/17 1530  INR 1.15   Cardiac Enzymes: No results for input(s): CKTOTAL, CKMB, CKMBINDEX, TROPONINI in the last 168 hours. BNP (last 3 results) No results for input(s): PROBNP in the last 8760 hours. HbA1C: Recent Labs    12/05/17 0858  HGBA1C 6.8*   CBG: Recent Labs  Lab 12/05/17 1651 12/05/17 2200  GLUCAP 125* 154*   Lipid Profile: Recent Labs    12/05/17 0858  CHOL 125  HDL 40*  LDLCALC 75  TRIG 51  CHOLHDL 3.1   Thyroid Function Tests: No results for input(s): TSH, T4TOTAL, FREET4, T3FREE, THYROIDAB in the last 72 hours. Anemia Panel: No results for input(s): VITAMINB12, FOLATE, FERRITIN, TIBC, IRON, RETICCTPCT in the last 72 hours. Sepsis Labs: No results for input(s): PROCALCITON, LATICACIDVEN in the last 168 hours.  Recent Results (from the past 240 hour(s))  Surgical pcr screen     Status: None   Collection Time: 12/05/17 10:52 AM  Result Value  Ref Range Status   MRSA, PCR NEGATIVE NEGATIVE Final   Staphylococcus aureus NEGATIVE NEGATIVE Final    Comment: (NOTE) The Xpert SA Assay (FDA approved for NASAL specimens in patients 11 years of age and older), is one component of a comprehensive surveillance program. It is not intended to diagnose infection nor to guide or monitor treatment. Performed at [facility], [place] [address], [place], [place] [address]          Radiology Studies: Ct Angio Head W Or Wo Contrast  Result Date: 12/04/2017 CLINICAL DATA:  RIGHT-sided upper extremity weakness and visual changes for 1 month. Subtotal RIGHT ICA occlusion. EXAM: CT ANGIOGRAPHY HEAD TECHNIQUE: Multidetector CT imaging of the head was performed using the  standard protocol during bolus administration of intravenous contrast. Multiplanar CT image reconstructions and MIPs were obtained to evaluate the vascular anatomy. CONTRAST:  65mL ISOVUE-370 IOPAMIDOL (ISOVUE-370) INJECTION 76% COMPARISON:  BILATERAL carotid duplex ultrasound earlier today FINDINGS: CT HEAD Brain: No evidence of acute infarction, hemorrhage, hydrocephalus, extra-axial collection or mass lesion/mass effect. Normal for age cerebral volume. No definite white matter disease. Tiny perivascular space versus chronic lacunar infarct, RIGHT posterior frontal subcortical white matter Vascular: No hyperdense vessel or unexpected calcification. Skull: Normal. Negative for fracture or focal lesion. Sinuses: No acute findings. Orbits: Unremarkable. CTA HEAD Anterior circulation: No significant stenosis, proximal occlusion, aneurysm, or vascular malformation. Calcific change affects the BILATERAL upper cervical, petrous, cavernous, and supraclinoid internal carotid arteries, without significant narrowing. Posterior circulation: No significant stenosis, proximal occlusion, aneurysm, or vascular malformation. RIGHT vertebral dominant, with LEFT vertebral primarily supplying the posterior  inferior cerebellar artery. Distal vertebral calcifications RIGHT V4 segment, nonstenotic. Venous sinuses: As permitted by contrast timing, patent. Anatomic variants: Fetal origin LEFT PCA.  Hypoplastic RIGHT A1 ACA Delayed phase: No abnormal intracranial enhancement. IMPRESSION: Calcification of the cavernous internal carotid arteries and distal RIGHT vertebral artery consistent with cerebrovascular atherosclerotic disease. No signs of intracranial large vessel occlusion. No findings to suggest acute intracranial ischemia. No abnormal postcontrast enhancement. Electronically Signed   By: Staci Righter M.D.   On: 12/04/2017 20:13   Ct Angio Neck W And/or Wo Contrast  Result Date: 12/05/2017 CLINICAL DATA:  Right carotid stenosis. Scheduled for vascular surgery in 2 days. EXAM: CT ANGIOGRAPHY NECK TECHNIQUE: Multidetector CT imaging of the neck was performed using the standard protocol during bolus administration of intravenous contrast. Multiplanar CT image reconstructions and MIPs were obtained to evaluate the vascular anatomy. Carotid stenosis measurements (when applicable) are obtained utilizing NASCET criteria, using the distal internal carotid diameter as the denominator. CONTRAST:  30mL ISOVUE-370 IOPAMIDOL (ISOVUE-370) INJECTION 76% COMPARISON:  Brain MRI 12/04/2017 CTA head 12/04/2017 FINDINGS: Aortic arch: There is mild calcific atherosclerosis of the aortic arch. There is no aneurysm, dissection or hemodynamically significant stenosis of the visualized ascending aorta and aortic arch. Conventional 3 vessel aortic branching pattern. The visualized proximal subclavian arteries are normal. Right carotid system: The right common carotid origin is widely patent. There is no common carotid or internal carotid artery dissection or aneurysm. Predominantly calcified atherosclerotic plaque at the right carotid bifurcation causes severe stenosis, measuring at least 90% by NASCET criteria. The distal right  internal carotid artery is patent. Left carotid system: The left common carotid origin is widely patent. There is no common carotid or internal carotid artery dissection or aneurysm. There is mixed calcified and noncalcified plaque at the left carotid bifurcation without hemodynamically significant stenosis by NASCET criteria. Vertebral arteries: The vertebral system is right dominant. Left vertebral artery origin is narrowed by atherosclerotic calcification. There is moderate narrowing of the V2 segment of the right vertebral artery at the level of the C3 vertebral body. There is severe stenosis of the right vertebral artery V3 segment (series 10, image 53). There is mild atherosclerotic calcification of the right V4 segment. Skeleton: There is no bony spinal canal stenosis. No lytic or blastic lesions. Other neck: The nasopharynx is clear. The oropharynx and hypopharynx are normal. The epiglottis is normal. The supraglottic larynx, glottis and subglottic larynx are normal. No retropharyngeal collection. The parapharyngeal spaces are preserved. The parotid and submandibular glands are normal. No sialolithiasis or salivary ductal dilatation. The thyroid gland is normal. There is no cervical lymphadenopathy. Upper  chest: No pneumothorax or pleural effusion. No nodules or masses. Review of the MIP images confirms the above findings IMPRESSION: 1. 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 Distal right ICA is patent. 2. 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 3. Left carotid bifurcation atherosclerotic disease without hemodynamically significant stenosis by NASCET criteria. 4.  Aortic Atherosclerosis (ICD10-I70.0). Electronically Signed   By: Ulyses Jarred M.D.   On: 12/05/2017 00:25   Mr Brain Wo Contrast  Result Date: 12/04/2017 CLINICAL DATA:  67 y/o M; 3 1  history of right-sided vision changes and left upper extremity weakness/discoordination. EXAM: MRI HEAD WITHOUT CONTRAST TECHNIQUE: Multiplanar, multiecho pulse sequences of the brain and surrounding structures were obtained without intravenous contrast. COMPARISON:  12/04/2017 CT of the head. FINDINGS: Brain: Several scattered punctate foci of diffusion hyperintensity are present throughout the right frontal and parietal lobes. Lesions demonstrate variable diffusion on ADC. Foci of infarction demonstrate associated T2 FLAIR hyperintense signal abnormality. Cystic change of a single right posterior frontal lesion without susceptibility blooming (series 8, image 76). No susceptibility abnormality on the SWAN sequence to indicate intracranial hemorrhage. No extra-axial collection, hydrocephalus, or effacement of basilar cisterns. Vascular: Normal flow voids. Skull and upper cervical spine: Normal marrow signal. Sinuses/Orbits: Negative. Other: None. IMPRESSION: Several punctate foci of infarction in the right frontal and parietal lobes of mixed age including recent acute/early subacute infarctions. No hemorrhage or mass effect. Electronically Signed   By: Kristine Garbe M.D.   On: 12/04/2017 21:30   US Carotid Bilateral  Result Date: 12/04/2017 CLINICAL DATA:  Right eye visual disturbance. Coronary artery disease. Peripheral vascular disease. Hypertension, previous tobacco abuse. EXAM: BILATERAL CAROTID DUPLEX ULTRASOUND TECHNIQUE: Pearline Cables scale imaging, color Doppler and duplex ultrasound was performed of bilateral carotid and vertebral arteries in the neck. COMPARISON:  None available TECHNIQUE: Quantification of carotid stenosis is based on velocity parameters that correlate the residual internal carotid diameter with NASCET-based stenosis levels, using the diameter of the distal internal carotid lumen as the denominator for stenosis measurement. The following velocity measurements were obtained: PEAK  SYSTOLIC/END DIASTOLIC RIGHT ICA:                     540/239cm/sec CCA:                     44/8JE/HUD SYSTOLIC ICA/CCA RATIO:  5.5 DIASTOLIC ICA/CCA RATIO: 29 ECA:                     132/17cm/sec LEFT ICA:                     97/31cm/sec CCA:                     14/[id number]/[id number] SYSTOLIC ICA/CCA RATIO:  1.5 DIASTOLIC ICA/CCA RATIO: 2.4 ECA:                     130/15cm/sec FINDINGS: RIGHT CAROTID ARTERY: Calcified plaque in the distal common carotid artery and circumferential in the bulb. Plaque extends into the proximal ICA. There is a short segment high-grade stenosis with focal aliasing on color Doppler interrogation. Markedly elevated peak systolic velocities and elevated diastolic velocities just distal to the lesion. Elsewhere normal waveforms and color Doppler signal. RIGHT VERTEBRAL ARTERY:  Normal flow direction and waveform. LEFT CAROTID ARTERY: Partially  calcified circumferential plaque in the bulb extending into proximal internal and external carotid arteries. Normal waveforms and color Doppler signal throughout however. LEFT VERTEBRAL ARTERY: Normal flow direction and waveform. IMPRESSION: 1. Critical near-occlusive proximal right ICA short-segment stenosis. Vascular surgical or neurointerventional radiology consultation recommended. 2. Left carotid plaque resulting in less than 50% stenosis. 3.  Antegrade bilateral vertebral arterial flow. These results will be called to the ordering clinician or representative by the Radiologist Assistant, and communication documented in the PACS or zVision Dashboard. Electronically Signed   By: Lucrezia Europe M.D.   On: 12/04/2017 12:32        Scheduled Meds: . atorvastatin  20 mg Oral Daily  . fenofibrate  160 mg Oral Daily  . insulin aspart  0-9 Units Subcutaneous TID WC  . nicotine  21 mg Transdermal Daily   Continuous Infusions: . cefUROXime (ZINACEF)  IV    . heparin 1,700 Units/hr (12/05/17 1729)  . potassium chloride       LOS: 2 days    Time  spent: 35 minutes.     Elmarie Shiley, MD [facility] Pager [phone]  If 7PM-7AM, please contact night-coverage [email or web address] Password [facility] 12/06/2017, 7:55 AM

## 2017-12-06 NOTE — Transfer of Care (Signed)
Immediate Anesthesia Transfer of Care Note  Patient: Scott [name]  Procedure(s) Performed: ENDARTERECTOMY CAROTID RIGHT (Right Neck) PATCH ANGIOPLASTY RIGHT CAROTID ARTERY (Right Neck)  Patient Location: PACU  Anesthesia Type:General  Level of Consciousness: awake, alert  and oriented  Airway & Oxygen Therapy: Patient Spontanous Breathing and Patient connected to nasal cannula oxygen  Post-op Assessment: Report given to RN, Post -op Vital signs reviewed and stable and Patient moving all extremities X 4  Post vital signs: Reviewed and stable  Last Vitals:  Vitals:   12/06/17 0546 12/06/17 0921  BP:  (!) 154/103  Pulse: 70   Resp: 18   Temp: 36.9 C   SpO2: 100%     Last Pain:  Vitals:   12/06/17 0546  TempSrc: Oral  PainSc:          Complications: No apparent anesthesia complications

## 2017-12-07 ENCOUNTER — Inpatient Hospital Stay (HOSPITAL_COMMUNITY): Payer: BLUE CROSS/BLUE SHIELD

## 2017-12-07 LAB — BASIC METABOLIC PANEL
ANION GAP: 10 (ref 5–15)
BUN: 19 mg/dL (ref 6–20)
CHLORIDE: 106 mmol/L (ref 101–111)
CO2: 20 mmol/L — AB (ref 22–32)
Calcium: 8.4 mg/dL — ABNORMAL LOW (ref 8.9–10.3)
Creatinine, Ser: 1.31 mg/dL — ABNORMAL HIGH (ref 0.61–1.24)
GFR calc non Af Amer: 56 mL/min — ABNORMAL LOW (ref >60)
GLUCOSE: 212 mg/dL — AB (ref 65–99)
Potassium: 3.7 mmol/L (ref 3.5–5.1)
Sodium: 136 mmol/L (ref 135–145)

## 2017-12-07 LAB — CBC
HEMATOCRIT: 37.4 % — AB (ref 39.0–52.0)
Hemoglobin: 12.7 g/dL — ABNORMAL LOW (ref 13.0–17.0)
MCH: 29.5 pg (ref 26.0–34.0)
MCHC: 34 g/dL (ref 30.0–36.0)
MCV: 87 fL (ref 78.0–100.0)
PLATELETS: 245 10*3/uL (ref 150–400)
RBC: 4.3 MIL/uL (ref 4.22–5.81)
RDW: 14 % (ref 11.5–15.5)
WBC: 13.2 10*3/uL — AB (ref 4.0–10.5)

## 2017-12-07 LAB — CREATININE, SERUM: CREATININE: 1.2 mg/dL (ref 0.61–1.24)

## 2017-12-07 LAB — POCT ACTIVATED CLOTTING TIME
ACTIVATED CLOTTING TIME: 219 s
ACTIVATED CLOTTING TIME: 274 s

## 2017-12-07 LAB — GLUCOSE, CAPILLARY
Glucose-Capillary: 177 mg/dL — ABNORMAL HIGH (ref 65–99)
Glucose-Capillary: 152 mg/dL — ABNORMAL HIGH (ref 65–99)

## 2017-12-07 LAB — APTT: aPTT: 30 seconds (ref 24–36)

## 2017-12-07 MED ORDER — SODIUM CHLORIDE 0.9 % IV SOLN
INTRAVENOUS | Status: DC
  Administered 2017-12-07: 08:00:00 via INTRAVENOUS

## 2017-12-07 MED ORDER — ATORVASTATIN CALCIUM 40 MG PO TABS: 40.0000 mg | ORAL_TABLET | Freq: Every day | ORAL | 0 refills | Status: DC

## 2017-12-07 MED ORDER — NICOTINE 21 MG/24HR TD PT24: 21.0000 mg | MEDICATED_PATCH | Freq: Every day | TRANSDERMAL | 0 refills | Status: DC

## 2017-12-07 MED ORDER — SODIUM CHLORIDE 0.9 % IV BOLUS (SEPSIS)
500.0000 mL | Freq: Once | INTRAVENOUS | Status: AC
  Administered 2017-12-07: 500 mL via INTRAVENOUS

## 2017-12-07 MED ORDER — HYDROCODONE-ACETAMINOPHEN 5-325 MG PO TABS: 1.0000 | ORAL_TABLET | Freq: Four times a day (QID) | ORAL | 0 refills | Status: DC | PRN

## 2017-12-07 MED ORDER — AMLODIPINE BESYLATE 10 MG PO TABS: 5.0000 mg | ORAL_TABLET | Freq: Every day | ORAL | 0 refills | Status: DC

## 2017-12-07 MED ORDER — NICOTINE 14 MG/24HR TD PT24: 14.0000 mg | MEDICATED_PATCH | Freq: Every day | TRANSDERMAL | Status: DC

## 2017-12-07 MED ORDER — ATORVASTATIN CALCIUM 40 MG PO TABS: 40.0000 mg | ORAL_TABLET | Freq: Every day | ORAL | Status: DC

## 2017-12-07 NOTE — Discharge Summary (Signed)
Physician Discharge Summary  Scott [name] VVO:[id number] DOB: September 26, 1950 DOA: 12/04/2017  PCP: Shirline Frees, MD  Admit date: 12/04/2017 Discharge date: 12/07/2017  Admitted From: Home  Disposition: Home   Recommendations for Outpatient Follow-up:  1. Follow up with PCP in 1-2 weeks 2. Please obtain BMP/CBC in one week 3. Needs repeat Renal function, resume Hyzaar if renal function allows.  4. Titrate BP medications to controlled BP.  5. Consider medication for DM if blood sugar is not controlled on Diet     Discharge Condition; Stable.  CODE STATUS: Full code.  Diet recommendation: Heart Healthy  Brief/Interim Summary:  Brief Narrative by Dr Tamala Julian: Scott Hoops Hamptonis a 66 y.o.malewith medical history significant ofHTN, HLD, A. fib on Eliquis, CAD,andPVD s/pright superficial femoral artery stenting in 01/2017 by Dr. Charlotte Crumb presentsafter having abnormaldoppler ultrasound of the carotidstoday whichrevealed signs of right carotid artery occlusionat [facility] earlier the day of admission.Patient reports that for the last several weeks he has had at least 8-10 episodeswhere he reports right eye vision loss that he states is kind of like a veil coming over his eyes. Symptoms last 35-40 seconds and then self resolve. The last such episode occurred with these complaints he reports associated symptoms of intermittent left hand weakness and/or discoordination intimately dropping things, lip tingling, fatigue, intermittent shortness of breath, and generalized leg and tiredness.1-2 weeks ago. He was seen by the ophthalmologist day of admission and noted to have an a relatively normal exam. The leg fatigue symptoms he reports are not like when he required s 5/tent placement back in 01/2017.Denies having any leg swelling, calf pain, at this time he reports still smoking three fourths a pack of cigarettes per day on average but he is trying to cut back with a vapor  pen".  ED Course:On admission into the emergency department patient was seen to be afebrile, pulse 62-117, respirations 16, blood pressure 155/86-169/96, and O2 saturations 94-99% on room air. Labs including CBC,BMP,PT, aPTT were unremarkable. Vascular surgery was consulted and recommended holding Eliquis and starting the patient on heparin in the morning for possible surgical intervention to tentatively set for3/15.    Assessment & Plan:   Principal Problem:   Internal carotid artery stenosis, right Active Problems:   Atrial fibrillation (HCC)   Essential hypertension   Hyperlipidemia   Arthritis   Tobacco use disorder   Stenosis of right carotid artery   Coronary artery disease due to lipid rich plaque   Preoperative cardiovascular examination   1-Right Carotid Occlusion;  CTA showed severe at least 90 % stenosis of proximal right internal carotid artery.  Now on heparin Gtt.  S/P  carotid endarterectomy  Dr [name] on 3-15. Clear by cardiology patient stable for discharge.  Smoking cessation counseling provided. .   AKI; mild increase cr. Received IV fluids. Cr repeated at 1.2. Ok to discharge with close follow up.   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  He was on eliquis prior to admission. Resume elequis tonight.  Neurology consulted.  Lipid panel  LDL 75 and Hb-A1c 6.8  Resume lower dose norvasc.  ECHO; pending   DM; new diagnosis.  Hb A1 c at 6.8. SSI  Patient will work on diet   HTN; resume low dose norvasc. Hold Hyzar due to increase cr.   Hypokalemia; replete IV   Afib;  He was in heparin gtt, during hospitalization. Ok to resume eliquis tonight per vascular.   Leukocytosis; suspect de margination post sx.  Discharge Diagnoses:  Principal Problem:   Internal carotid artery stenosis, right Active Problems:   Atrial fibrillation (HCC)   Essential hypertension   Hyperlipidemia   Arthritis   Tobacco use disorder    Stenosis of right carotid artery   Coronary artery disease due to lipid rich plaque   Preoperative cardiovascular examination    Discharge Instructions  Discharge Instructions    Diet - low sodium heart healthy   Complete by:  As directed    Increase activity slowly   Complete by:  As directed      Allergies as of 12/07/2017   No Known Allergies     Medication List    STOP taking these medications   losartan-hydrochlorothiazide 100-25 MG tablet Commonly known as:  HYZAAR   potassium chloride SA 20 MEQ tablet Commonly known as:  K-DUR,KLOR-CON   sildenafil 100 MG tablet Commonly known as:  VIAGRA     TAKE these medications   amLODipine 10 MG tablet Commonly known as:  NORVASC Take 0.5 tablets (5 mg total) by mouth daily. What changed:  how much to take   atorvastatin 40 MG tablet Commonly known as:  LIPITOR Take 1 tablet (40 mg total) by mouth daily. Start taking on:  12/08/2017 What changed:    medication strength  how much to take   ELIQUIS 5 MG Tabs tablet Generic drug:  apixaban TAKE 1 TABLET(5 MG) BY MOUTH TWICE DAILY What changed:  See the new instructions.   fenofibrate 145 MG tablet Commonly known as:  TRICOR Take 145 mg by mouth daily.   HYDROcodone-acetaminophen 5-325 MG tablet Commonly known as:  NORCO/VICODIN Take 1-2 tablets by mouth every 6 (six) hours as needed (for pain). What changed:  when to take this   nicotine 21 mg/24hr patch Commonly known as:  NICODERM CQ - dosed in mg/24 hours Place 1 patch (21 mg total) onto the skin daily.   ofloxacin 0.3 % ophthalmic solution Commonly known as:  OCUFLOX Instill 1 drop in the affected eye every hour for the first 4-5 hours then use 1 drop in the affected eye twice daily as needed for infection   REFRESH OPTIVE OP Place 1 drop into both eyes every morning.      Follow-up Information    Serafina Mitchell, MD Follow up in 2 week(s).   Specialties:  Vascular Surgery, Cardiology Why:   office will call patient Contact information: [place] [address] [phone]          No Known Allergies  Consultations:  Neurology  vascular   Procedures/Studies: Ct Angio Head W Or Wo Contrast  Result Date: 12/04/2017 CLINICAL DATA:  RIGHT-sided upper extremity weakness and visual changes for 1 month. Subtotal RIGHT ICA occlusion. EXAM: CT ANGIOGRAPHY HEAD TECHNIQUE: Multidetector CT imaging of the head was performed using the standard protocol during bolus administration of intravenous contrast. Multiplanar CT image reconstructions and MIPs were obtained to evaluate the vascular anatomy. CONTRAST:  54mL ISOVUE-370 IOPAMIDOL (ISOVUE-370) INJECTION 76% COMPARISON:  BILATERAL carotid duplex ultrasound earlier today FINDINGS: CT HEAD Brain: No evidence of acute infarction, hemorrhage, hydrocephalus, extra-axial collection or mass lesion/mass effect. Normal for age cerebral volume. No definite white matter disease. Tiny perivascular space versus chronic lacunar infarct, RIGHT posterior frontal subcortical white matter Vascular: No hyperdense vessel or unexpected calcification. Skull: Normal. Negative for fracture or focal lesion. Sinuses: No acute findings. Orbits: Unremarkable. CTA HEAD Anterior circulation: No significant stenosis, proximal occlusion, aneurysm, or vascular malformation. Calcific change  affects the BILATERAL upper cervical, petrous, cavernous, and supraclinoid internal carotid arteries, without significant narrowing. Posterior circulation: No significant stenosis, proximal occlusion, aneurysm, or vascular malformation. RIGHT vertebral dominant, with LEFT vertebral primarily supplying the posterior inferior cerebellar artery. Distal vertebral calcifications RIGHT V4 segment, nonstenotic. Venous sinuses: As permitted by contrast timing, patent. Anatomic variants: Fetal origin LEFT PCA.  Hypoplastic RIGHT A1 ACA Delayed phase: No abnormal intracranial enhancement.  IMPRESSION: Calcification of the cavernous internal carotid arteries and distal RIGHT vertebral artery consistent with cerebrovascular atherosclerotic disease. No signs of intracranial large vessel occlusion. No findings to suggest acute intracranial ischemia. No abnormal postcontrast enhancement. Electronically Signed   By: Staci Righter M.D.   On: 12/04/2017 20:13   Ct Angio Neck W And/or Wo Contrast  Result Date: 12/05/2017 CLINICAL DATA:  Right carotid stenosis. Scheduled for vascular surgery in 2 days. EXAM: CT ANGIOGRAPHY NECK TECHNIQUE: Multidetector CT imaging of the neck was performed using the standard protocol during bolus administration of intravenous contrast. Multiplanar CT image reconstructions and MIPs were obtained to evaluate the vascular anatomy. Carotid stenosis measurements (when applicable) are obtained utilizing NASCET criteria, using the distal internal carotid diameter as the denominator. CONTRAST:  6mL ISOVUE-370 IOPAMIDOL (ISOVUE-370) INJECTION 76% COMPARISON:  Brain MRI 12/04/2017 CTA head 12/04/2017 FINDINGS: Aortic arch: There is mild calcific atherosclerosis of the aortic arch. There is no aneurysm, dissection or hemodynamically significant stenosis of the visualized ascending aorta and aortic arch. Conventional 3 vessel aortic branching pattern. The visualized proximal subclavian arteries are normal. Right carotid system: The right common carotid origin is widely patent. There is no common carotid or internal carotid artery dissection or aneurysm. Predominantly calcified atherosclerotic plaque at the right carotid bifurcation causes severe stenosis, measuring at least 90% by NASCET criteria. The distal right internal carotid artery is patent. Left carotid system: The left common carotid origin is widely patent. There is no common carotid or internal carotid artery dissection or aneurysm. There is mixed calcified and noncalcified plaque at the left carotid bifurcation without  hemodynamically significant stenosis by NASCET criteria. Vertebral arteries: The vertebral system is right dominant. Left vertebral artery origin is narrowed by atherosclerotic calcification. There is moderate narrowing of the V2 segment of the right vertebral artery at the level of the C3 vertebral body. There is severe stenosis of the right vertebral artery V3 segment (series 10, image 53). There is mild atherosclerotic calcification of the right V4 segment. Skeleton: There is no bony spinal canal stenosis. No lytic or blastic lesions. Other neck: The nasopharynx is clear. The oropharynx and hypopharynx are normal. The epiglottis is normal. The supraglottic larynx, glottis and subglottic larynx are normal. No retropharyngeal collection. The parapharyngeal spaces are preserved. The parotid and submandibular glands are normal. No sialolithiasis or salivary ductal dilatation. The thyroid gland is normal. There is no cervical lymphadenopathy. Upper chest: No pneumothorax or pleural effusion. No nodules or masses. Review of the MIP images confirms the above findings IMPRESSION: 1. 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 Distal right ICA is patent. 2. 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 3. Left carotid bifurcation atherosclerotic disease without hemodynamically significant stenosis by NASCET criteria. 4.  Aortic Atherosclerosis (ICD10-I70.0). Electronically Signed   By: Ulyses Jarred M.D.   On: 12/05/2017 00:25   Mr Brain Wo Contrast  Result Date: 12/04/2017 CLINICAL DATA:  67 y/o M; 3 1 history of  right-sided vision changes and left upper extremity weakness/discoordination. EXAM: MRI HEAD WITHOUT CONTRAST TECHNIQUE: Multiplanar, multiecho pulse sequences of the brain and surrounding structures were obtained without intravenous contrast. COMPARISON:   12/04/2017 CT of the head. FINDINGS: Brain: Several scattered punctate foci of diffusion hyperintensity are present throughout the right frontal and parietal lobes. Lesions demonstrate variable diffusion on ADC. Foci of infarction demonstrate associated T2 FLAIR hyperintense signal abnormality. Cystic change of a single right posterior frontal lesion without susceptibility blooming (series 8, image 76). No susceptibility abnormality on the SWAN sequence to indicate intracranial hemorrhage. No extra-axial collection, hydrocephalus, or effacement of basilar cisterns. Vascular: Normal flow voids. Skull and upper cervical spine: Normal marrow signal. Sinuses/Orbits: Negative. Other: None. IMPRESSION: Several punctate foci of infarction in the right frontal and parietal lobes of mixed age including recent acute/early subacute infarctions. No hemorrhage or mass effect. Electronically Signed   By: Kristine Garbe M.D.   On: 12/04/2017 21:30   US Carotid Bilateral  Result Date: 12/04/2017 CLINICAL DATA:  Right eye visual disturbance. Coronary artery disease. Peripheral vascular disease. Hypertension, previous tobacco abuse. EXAM: BILATERAL CAROTID DUPLEX ULTRASOUND TECHNIQUE: Pearline Cables scale imaging, color Doppler and duplex ultrasound was performed of bilateral carotid and vertebral arteries in the neck. COMPARISON:  None available TECHNIQUE: Quantification of carotid stenosis is based on velocity parameters that correlate the residual internal carotid diameter with NASCET-based stenosis levels, using the diameter of the distal internal carotid lumen as the denominator for stenosis measurement. The following velocity measurements were obtained: PEAK SYSTOLIC/END DIASTOLIC RIGHT ICA:                     540/239cm/sec CCA:                     28/3TD/VVO SYSTOLIC ICA/CCA RATIO:  5.5 DIASTOLIC ICA/CCA RATIO: 29 ECA:                     132/17cm/sec LEFT ICA:                     97/31cm/sec CCA:                      16/07PX/TGG SYSTOLIC ICA/CCA RATIO:  1.5 DIASTOLIC ICA/CCA RATIO: 2.4 ECA:                     130/15cm/sec FINDINGS: RIGHT CAROTID ARTERY: Calcified plaque in the distal common carotid artery and circumferential in the bulb. Plaque extends into the proximal ICA. There is a short segment high-grade stenosis with focal aliasing on color Doppler interrogation. Markedly elevated peak systolic velocities and elevated diastolic velocities just distal to the lesion. Elsewhere normal waveforms and color Doppler signal. RIGHT VERTEBRAL ARTERY:  Normal flow direction and waveform. LEFT CAROTID ARTERY: Partially calcified circumferential plaque in the bulb extending into proximal internal and external carotid arteries. Normal waveforms and color Doppler signal throughout however. LEFT VERTEBRAL ARTERY: Normal flow direction and waveform. IMPRESSION: 1. Critical near-occlusive proximal right ICA short-segment stenosis. Vascular surgical or neurointerventional radiology consultation recommended. 2. Left carotid plaque resulting in less than 50% stenosis. 3.  Antegrade bilateral vertebral arterial flow. These results will be called to the ordering clinician or representative by the Radiologist Assistant, and communication documented in the PACS or zVision Dashboard. Electronically Signed   By: Lucrezia Europe M.D.   On: 12/04/2017 12:32       Subjective: He is feeling well. Report  improve strength left hand.  Wants to go home   Discharge Exam: Vitals:   12/07/17 0827 12/07/17 1145  BP: (!) 166/94 (!) 156/91  Pulse:    Resp: 19 (!) 21  Temp: 98.8 F (37.1 C) 98 F (36.7 C)  SpO2: 97% 98%   Vitals:   12/06/17 2005 12/07/17 0402 12/07/17 0827 12/07/17 1145  BP: (!) 143/78 (!) 145/71 (!) 166/94 (!) 156/91  Pulse: 98 80    Resp: 18 (!) 21 19 (!) 21  Temp:  99 F (37.2 C) 98.8 F (37.1 C) 98 F (36.7 C)  TempSrc:  Oral Oral Oral  SpO2: 98% 96% 97% 98%  Weight:      Height:        General: Pt is alert,  awake, not in acute distress, incision right side neck  Cardiovascular: RRR, S1/S2 +, no rubs, no gallops Respiratory: CTA bilaterally, no wheezing, no rhonchi Abdominal: Soft, NT, ND, bowel sounds + Extremities: no edema, no cyanosis    The results of significant diagnostics from this hospitalization (including imaging, microbiology, ancillary and laboratory) are listed below for reference.     Microbiology: Recent Results (from the past 240 hour(s))  Surgical pcr screen     Status: None   Collection Time: 12/05/17 10:52 AM  Result Value Ref Range Status   MRSA, PCR NEGATIVE NEGATIVE Final   Staphylococcus aureus NEGATIVE NEGATIVE Final    Comment: (NOTE) The Xpert SA Assay (FDA approved for NASAL specimens in patients 96 years of age and older), is one component of a comprehensive surveillance program. It is not intended to diagnose infection nor to guide or monitor treatment. Performed at [facility], [place] [address] [place], [place] [address]      Labs: BNP (last 3 results) No results for input(s): BNP in the last 8760 hours. Basic Metabolic Panel: Recent Labs  Lab 12/04/17 1530 12/05/17 0445 12/06/17 0636 12/07/17 0510  NA 137 138 139 136  K 3.7 3.1* 3.1* 3.7  CL 104 105 106 106  CO2 23 24 23  20*  GLUCOSE 116* 204* 133* 212*  BUN 18 15 9 19   CREATININE 0.99 0.89 0.85 1.31*  CALCIUM 9.2 8.5* 8.6* 8.4*  MG  --  1.7  --   --    Liver Function Tests: No results for input(s): AST, ALT, ALKPHOS, BILITOT, PROT, ALBUMIN in the last 168 hours. No results for input(s): LIPASE, AMYLASE in the last 168 hours. No results for input(s): AMMONIA in the last 168 hours. CBC: Recent Labs  Lab 12/04/17 1530 12/05/17 0445 12/06/17 0636 12/07/17 0510  WBC 6.8 5.9 8.3 13.2*  NEUTROABS 4.2 2.1  --   --   HGB 16.0 14.3 15.0 12.7*  HCT 46.1 41.6 43.2 37.4*  MCV 87.1 86.8 85.5 87.0  PLT 303 259 276 245   Cardiac Enzymes: No results for input(s): CKTOTAL, CKMB,  CKMBINDEX, TROPONINI in the last 168 hours. BNP: Invalid input(s): POCBNP CBG: Recent Labs  Lab 12/06/17 0754 12/06/17 1504 12/06/17 2007 12/07/17 0611 12/07/17 1114  GLUCAP 143* 184* 207* 177* 152*   D-Dimer No results for input(s): DDIMER in the last 72 hours. Hgb A1c Recent Labs    12/05/17 0858  HGBA1C 6.8*   Lipid Profile Recent Labs    12/05/17 0858  CHOL 125  HDL 40*  LDLCALC 75  TRIG 51  CHOLHDL 3.1   Thyroid function studies No results for input(s): TSH, T4TOTAL, T3FREE, THYROIDAB in the last 72 hours.  Invalid input(s): FREET3 Anemia work up No results for input(s): VITAMINB12, FOLATE, FERRITIN, TIBC, IRON, RETICCTPCT in the last 72 hours. Urinalysis No results found for: COLORURINE, APPEARANCEUR, [place], [place], [place], [place], [place], [place], PROTEINUR, UROBILINOGEN, NITRITE, LEUKOCYTESUR Sepsis Labs Invalid input(s): PROCALCITONIN,  WBC,  LACTICIDVEN Microbiology Recent Results (from the past 240 hour(s))  Surgical pcr screen     Status: None   Collection Time: 12/05/17 10:52 AM  Result Value Ref Range Status   MRSA, PCR NEGATIVE NEGATIVE Final   Staphylococcus aureus NEGATIVE NEGATIVE Final    Comment: (NOTE) The Xpert SA Assay (FDA approved for NASAL specimens in patients 38 years of age and older), is one component of a comprehensive surveillance program. It is not intended to diagnose infection nor to guide or monitor treatment. Performed at [facility], [place] [address], [place], [place] [address]      Time coordinating discharge: Over 30 minutes  SIGNED:   Elmarie Shiley, MD  [facility] 12/07/2017, 12:37 PM Pager   If 7PM-7AM, please contact night-coverage [email or web address] Password [email or web address]

## 2017-12-07 NOTE — Op Note (Addendum)
Patient name: Scott [name] MRN: [id number] DOB: 11-03-1950 Sex: male  12/06/2017 Pre-operative Diagnosis: Symptomatic   right carotid stenosis Post-operative diagnosis:  Same Surgeon:  Annamarie Major Assistants:  Arlee Muslim Procedure:   #1: right carotid Endarterectomy with bovine pericardial patch angioplasty   #2: Intraoperative carotid duplex Anesthesia:  General Blood Loss:  250 Specimens:  none  Findings:  95 %stenosis; Thrombus:  none  Indications: The patient presented to the emergency department with several episodes of right visual changes.  He also described perioral tingling and some clumsiness in his left arm.  He had an MRI which showed several areas of punctate lesions within the right brain consistent with stroke.  Ultrasound showed nearly occlusive lesion in the right carotid artery which was confirmed with CT angiogram.  He is here today for endarterectomy.  Procedure:  The patient was identified in the holding area and taken to [facility]  The patient was then placed supine on the table.   General endotrachial anesthesia was administered.  The patient was prepped and draped in the usual sterile fashion.  A time out was called and antibiotics were administered.  The incision was made along the anterior border of the right sternocleidomastoid muscle.  Cautery was used to dissect through the subcutaneous tissue.  The platysma muscle was divided with cautery.  The internal jugular vein was exposed along its anterior medial border.  The common facial vein was exposed and then divided between 2-0 silk ties and metal clips.  The common carotid artery was then circumferentially exposed and encircled with an umbilical tape.  The vagus nerve was identified and protected.  Next sharp dissection was used to expose the external carotid artery and the superior thyroid artery.  The were encircled with a blue vessel loop and a 2-0 silk tie respectively.  Finally, the internal carotid was  carefully dissected free.  An umbilical tape was placed around the internal carotid artery distal to the diseased segment.  The hypoglossal nerve was visualized throughout and protected.  The patient was given systemic heparinization.  A bovine carotid patch was selected and prepared on the back table.  A 10 french shunt was also prepared.  After blood pressure readings were appropriate and the heparin had been given time to circulate, the internal carotid artery was occluded with a baby Gregory clamp.  The external and common carotid arteries were then occluded with vascular clamps and the 2-0 tie tightened on the superior thyroid artery.  A #11 blade was used to make an arteriotomy in the common carotid artery.  This was extended with Potts scissors along the anterior and lateral border of the common and internal carotid artery.  Approximately 95% stenosis was identified.  There was no thrombus identified.  The 10 french shunt was then placed.  A kleiner kuntz elevator was used to perform endarterectomy.  An eversion endarterectomy was performed in the external carotid artery.  A good distal endpoint was obtained in the internal carotid artery.  The specimen was removed and sent to pathology.  Heparinized saline was used to irrigate the endarterectomized field.  All potential embolic debris was removed.  Bovine pericardial patch angioplasty was then performed using a running 6-0 Prolene. Just prior to completion of the repair, the shunt was removed. The common internal and external carotid arteries were all appropriately flushed. The artery was again irrigated with heparin saline.  The anastomosis was then secured. The clamp was first released on the external  carotid artery followed by the common carotid artery approximately 30 seconds later, bloodflow was reestablish through the internal carotid artery.  Next, a hand-held  Doppler was used to evaluate the signals in the common, external, and internal  carotid  arteries, all of which had appropriate signals. I then administered  50 mg protamine. The wound was then irrigated.  After hemostasis was achieved, the carotid sheath was reapproximated with 3-0 Vicryl. The  platysma muscle was reapproximated with running 3-0 Vicryl. The skin  was closed with 4-0 Vicryl. Dermabond was placed on the skin. The  patient was then successfully extubated. His neurologic exam was  similar to his preprocedural exam. The patient was then taken to recovery room  in stable condition. There were no complications.     Disposition:  To PACU in stable condition.  Relevant Operative Details: Extensive calcified plaque within the distal common carotid artery and internal carotid artery which extended up underneath the hypoglossal nerve.  He had moderate backbleeding and so a shunt was utilized.  In order to remove the plaque in the common carotid artery I had to extend this distally to near the level of the clavicle.  I closed the common carotid artery primarily and used a bovine pericardial patch on the distal common and internal carotid artery.  When I released the clamps, I did not have a pulse in the common carotid artery.  This returned after a few seconds.  I had not yet released the clamp on the internal carotid artery.  Shortly thereafter a pulse was regained within the common carotid artery therefore I suspected that this could potentially have been related to the clamp.  Before releasing the clamp on the internal carotid artery, I evaluated the repair with intraoperative duplex.  This did not show any defects.  I then released the clamp on the internal carotid artery.  There were excellent Doppler signals at this time.  Theotis Burrow, M.D. [facility] Office: [phone] Pager:  [phone]

## 2017-12-07 NOTE — Progress Notes (Signed)
STROKE TEAM PROGRESS NOTE   HISTORY OF PRESENT ILLNESS (per record) Scott [name] is a 67 y.o. 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  He reports over the last few months he has had between 6-10 episodes of transient right-sided vision loss which only last a few seconds and then resolve on its own.  He thinks these have often if not always been associated with change in position from bending over to standing up.  He has also noticed 2 episodes of decreased sensation in the left perioral area as well as some numbness in his left hand with decreased grip strength on occasion as well as dropping of small objects.  He denies any chest pain, palpitations or dyspnea. He says he was seen by his ophthalmologist for his vision changes and reportedly had a normal eye exam.  He was sent to the emergency department after his carotid Doppler findings for vascular surgery consultation and intervention for right carotid endarterectomy.  He has been taking Eliquis for atrial fibrillation, previously on Plavix prior to his diagnosis of A. fib. He does not take a low-dose aspirin.  He takes atorvastatin 20 mg daily for cholesterol and fenofibrate 160 mg daily for triglycerides.  He denies any personal or family history of stroke.  He says his father did have a right carotid surgery in his mid 9s.  He does admit to continued tobacco use, currently smoking between a half a pack to three quarters of a pack per day down from 1-1.25 packs/day.  He has been smoking since the age of 37.  MRI brain showed several punctate foci of infarction in the right frontal and parietal lobes which are thought to be acute to  early subacute infarcts.  CTA head showed calcification of the cavernous internal carotid arteries and distal right vertebral artery.  CTA neck showed severe at least 90% stenosis of the proximal right ICA with severe stenosis of the right vertebral artery V3 segment which is patent distally.  Vascular surgery was subsequently consulted and plan for right endarterectomy tomorrow morning.  His Eliquis has been discontinued and he is currently on heparin for anticoagulation.  NIHSS: 0     SUBJECTIVE (INTERVAL HISTORY) His wife is at bedside, answered all questions    OBJECTIVE Temp:  [98 F (36.7 C)-99.1 F (37.3 C)] 98 F (36.7 C) (03/16 1145) Pulse Rate:  [77-102] 80 (03/16 0402) Cardiac Rhythm: Atrial fibrillation (03/16 0850) Resp:  [14-23] 21 (03/16 1145) BP: (115-166)/(66-99) 156/91 (03/16 1145) SpO2:  [92 %-100 %] 98 % (03/16 1145) Arterial Line BP: (72-157)/(51-71) 157/71 (03/15 1824)  CBC:  Recent Labs  Lab 12/04/17 1530 12/05/17 0445 12/06/17 0636 12/07/17 0510  WBC 6.8 5.9 8.3 13.2*  NEUTROABS 4.2 2.1  --   --   HGB 16.0 14.3 15.0 12.7*  HCT 46.1 41.6 43.2 37.4*  MCV 87.1 86.8 85.5 87.0  PLT 303 259 276 092    Basic Metabolic Panel:  Recent Labs  Lab 12/05/17 0445 12/06/17 0636 12/07/17 0510  NA 138 139 136  K 3.1* 3.1* 3.7  CL 105 106 106  CO2 24 23 20*  GLUCOSE 204* 133* 212*  BUN 15 9 19   CREATININE 0.89 0.85 1.31*  CALCIUM 8.5* 8.6* 8.4*  MG 1.7  --   --  Lipid Panel:     Component Value Date/Time   CHOL 125 12/05/2017 0858   CHOL 146 10/23/2017 0933   TRIG 51 12/05/2017 0858   HDL 40 (L) 12/05/2017 0858   HDL 37 (L) 10/23/2017 0933   CHOLHDL 3.1 12/05/2017 0858   VLDL 10 12/05/2017 0858   LDLCALC 75 12/05/2017 0858   LDLCALC 88 10/23/2017 0933   HgbA1c:  Lab Results  Component Value Date   HGBA1C 6.8 (H) 12/05/2017   Urine Drug Screen: No results found for: LABOPIA, COCAINSCRNUR, LABBENZ, AMPHETMU, THCU, LABBARB  Alcohol  Level No results found for: ETH  IMAGING  Ct Angio Head W Or Wo Contrast 12/04/2017 IMPRESSION:  Calcification of the cavernous internal carotid arteries and distal RIGHT vertebral artery consistent with cerebrovascular atherosclerotic disease. No signs of intracranial large vessel occlusion. No findings to suggest acute intracranial ischemia. No abnormal postcontrast enhancement.   Ct Angio Neck W And/or Wo Contrast 12/05/2017 IMPRESSION:  1. 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 Distal right ICA is patent.  2. 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  3. Left carotid bifurcation atherosclerotic disease without hemodynamically significant stenosis by NASCET criteria.  4.  Aortic Atherosclerosis (ICD10-I70.0).    Mr Brain Wo Contrast 12/04/2017 IMPRESSION:  Several punctate foci of infarction in the right frontal and parietal lobes of mixed age including recent acute/early subacute infarctions. No hemorrhage or mass effect.    US Carotid Bilateral 12/04/2017 IMPRESSION:  1. Critical near-occlusive proximal right ICA short-segment stenosis. Vascular surgical or neurointerventional radiology consultation recommended.  2. Left carotid plaque resulting in less than 50% stenosis.  3.  Antegrade bilateral vertebral arterial flow.     Transthoracic Echocardiogram - pending 00/00/00     PHYSICAL EXAM Vitals:   12/06/17 2005 12/07/17 0402 12/07/17 0827 12/07/17 1145  BP: (!) 143/78 (!) 145/71 (!) 166/94 (!) 156/91  Pulse: 98 80    Resp: 18 (!) 21 19 (!) 21  Temp:  99 F (37.2 C) 98.8 F (37.1 C) 98 F (36.7 C)  TempSrc:  Oral Oral Oral  SpO2: 98% 96% 97% 98%  Weight:      Height:        Physical exam: Exam: Gen: NAD, conversant, well nourised, obese, well groomed                     CV: irregular, no MRG. No Carotid  Bruits. No peripheral edema, warm, nontender Eyes: Conjunctivae clear without exudates or hemorrhage  Neuro: Detailed Neurologic Exam  Speech:    Speech is normal; fluent and spontaneous with normal comprehension.  Cognition:    The patient is oriented to person, place, and time;     recent and remote memory intact;     language fluent;     normal attention, concentration,     fund of knowledge Cranial Nerves:    The pupils are equal, round, and reactive to light. Visual fields are full to finger confrontation. Extraocular movements are intact. Trigeminal sensation is intact and the muscles of mastication are normal. The face is symmetric. The palate elevates in the midline. Hearing intact. Voice is normal. Shoulder shrug is normal. The tongue has normal motion without fasciculations.   Coordination:    Normal finger to nose and heel to shin. Normal rapid alternating movements.   Motor Observation:    No asymmetry, no  atrophy, and no involuntary movements noted. Tone:    Normal muscle tone.    Posture:    Posture is normal. normal erect    Strength: Minimal left arm weakness, otherwise strength is V/V in the upper and lower limbs.      Sensation: intact to LT     Reflex Exam:  DTR's:    Deep tendon reflexes in the upper and lower extremities are normal bilaterally.   Toes:    The toes are downgoing bilaterally.   Clonus:    Clonus is absent.        ASSESSMENT/PLAN Scott [name] is a 67 y.o. male with hypertension, hyperlipidemia, atrial fibrillation on Eliquis, CAD, and peripheral vascular disease status post right superficial femoral artery stenting in May 2018 who was admitted after carotid Doppler ultrasound showed critical near occlusive proximal right internal carotid artery occlusion on 12/04/2016. He did not receive IV t-PA due to no acute stroke identified, NHISS - 0, on anticoagulation.  Stroke: Several punctate infarcts right frontal and parietal lobes  - embolic 2/2 Rt ICA stenosis.  Resultant  Resolution of symptoms  CT head - No findings to suggest acute intracranial ischemia  MRI head - Several punctate foci of infarction in the right frontal and parietal lobes   MRA head - not performed  CTA H&N - severe stenosis right internal carotid artery and right vertebral artery.  Carotid Doppler - Critical near-occlusive proximal right ICA short-segment stenosis.  2D Echo - pending  LDL - 75  HgbA1c - 6.8  VTE prophylaxis - Subcutaneous heparin   Fall precautions Diet Heart Room service appropriate? Yes; Fluid consistency: Thin  clopidogrel 75 mg daily and Eliquis (apixaban) daily prior to admission, now on aspirin 325 mg daily  Patient counseled to be compliant with his antithrombotic medications  Ongoing aggressive stroke risk factor management  Therapy recommendations:  pending  Disposition:  Pending  Hypertension  Stable  Permissive hypertension (OK if < 220/120) but gradually normalize in 5-7 days  Long-term BP goal normotensive  Hyperlipidemia  Home meds:  Lipitor 20 mg daily resumed in hospital  LDL 75, goal < 70  Increase Lipitor to 40 mg daily  Continue statin at discharge   Other Stroke Risk Factors  Advanced age  Cigarette smoker - advised to stop smoking  ETOH use, advised to drink no more than 1 drink per day.  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  I have personally obtained the history, evaluated lab date, reviewed imaging studies and agree with radiology interpretations.    Sarina Ill, MD Stroke Neurology   To contact Stroke Continuity provider, please refer to [email or web address] After hours, contact General Neurology

## 2017-12-07 NOTE — Progress Notes (Signed)
Pt discharged home with wife. IV and telemetry box removed. Pt received discharge instructions and all questions were answered. Pt received paper prescriptions, including Norco, and was instructed to get them filled at his pharmacy. Pt verbalized understanding. Pt left with all of his belongings. Pt discharged via wheelchair and was accompanied by this RN.    Grant Fontana BSN, RN

## 2017-12-07 NOTE — Discharge Instructions (Signed)
[facility]  Discharge Instructions   Carotid Endarterectomy (CEA)  Please refer to the following instructions for your post-procedure care. Your surgeon or physician assistant will discuss any changes with you.  Activity  You are encouraged to walk as much as you can. You can slowly return to normal activities but must avoid strenuous activity and heavy lifting until your doctor tell you it's OK. Avoid activities such as vacuuming or swinging a golf club. You can drive after one week if you are comfortable and you are no longer taking prescription pain medications. It is normal to feel tired for serval weeks after your surgery. It is also normal to have difficulty with sleep habits, eating, and bowel movements after surgery. These will go away with time.  Bathing/Showering  You may shower after you come home. Do not soak in a bathtub, hot tub, or swim until the incision heals completely.  Incision Care  Shower every day. Clean your incision with mild soap and water. Pat the area dry with a clean towel. You do not need a bandage unless otherwise instructed. Do not apply any ointments or creams to your incision. You may have skin glue on your incision. Do not peel it off. It will come off on its own in about one week. Your incision may feel thickened and raised for several weeks after your surgery. This is normal and the skin will soften over time. For Men Only: It's OK to shave around the incision but do not shave the incision itself for 2 weeks. It is common to have numbness under your chin that could last for several months.  Diet  Resume your normal diet. There are no special food restrictions following this procedure. A low fat/low cholesterol diet is recommended for all patients with vascular disease. In order to heal from your surgery, it is CRITICAL to get adequate nutrition. Your body requires vitamins, minerals, and protein. Vegetables are the best  source of vitamins and minerals. Vegetables also provide the perfect balance of protein. Processed food has little nutritional value, so try to avoid this.        Medications  Resume taking all of your medications unless your doctor or physician assistant tells you not to. If your incision is causing pain, you may take over-the- counter pain relievers such as acetaminophen (Tylenol). If you were prescribed a stronger pain medication, please be aware these medications can cause nausea and constipation. Prevent nausea by taking the medication with a snack or meal. Avoid constipation by drinking plenty of fluids and eating foods with a high amount of fiber, such as fruits, vegetables, and grains. Do not take Tylenol if you are taking prescription pain medications.  Follow Up  Our office will schedule a follow up appointment 2-3 weeks following discharge.  Please call us immediately for any of the following conditions  Increased pain, redness, drainage (pus) from your incision site. Fever of 101 degrees or higher. If you should develop stroke (slurred speech, difficulty swallowing, weakness on one side of your body, loss of vision) you should call 911 and go to the nearest emergency room.  Reduce your risk of vascular disease:  Stop smoking. If you would like help call [facility] at 1-800-QUIT-NOW (856)228-7206) or [facility] at [phone]. Manage your cholesterol Maintain a desired weight Control your diabetes Keep your blood pressure down  If you have any questions, please call the office at [phone].   Take 5 mg of amlodipine if  BP above 140.  Needs to follow up with PCP to check your kidney.

## 2017-12-07 NOTE — Progress Notes (Signed)
MD notified of creatinine results and stated that pt is okay to discharge after PT evaluation.     Grant Fontana BSN, RN

## 2017-12-07 NOTE — Progress Notes (Addendum)
  Progress Note    12/07/2017 9:45 AM 1 Day Post-Op  Subjective:  Denies stroke like symptoms including slurring speech, changes in vision, or one sided weakness.  Believes L hand weakness has resolved since surgery.   Vitals:   12/07/17 0402 12/07/17 0827  BP: (!) 145/71 (!) 166/94  Pulse: 80   Resp: (!) 21 19  Temp: 99 F (37.2 C) 98.8 F (37.1 C)  SpO2: 96% 97%   Physical Exam: Lungs:  Non labored Incisions:  R neck incision with minimal edema; soft, no bleeding or hematoma Extremities:  Moving all extremities well Abdomen:  Soft Neurologic: CN III-XII Grossly intact  CBC    Component Value Date/Time   WBC 13.2 (H) 12/07/2017 0510   RBC 4.30 12/07/2017 0510   HGB 12.7 (L) 12/07/2017 0510   HGB 16.6 10/23/2017 0933   HCT 37.4 (L) 12/07/2017 0510   HCT 48.6 10/23/2017 0933   PLT 245 12/07/2017 0510   PLT 338 10/23/2017 0933   MCV 87.0 12/07/2017 0510   MCV 87 10/23/2017 0933   MCH 29.5 12/07/2017 0510   MCHC 34.0 12/07/2017 0510   RDW 14.0 12/07/2017 0510   RDW 13.8 10/23/2017 0933   LYMPHSABS 3.2 12/05/2017 0445   LYMPHSABS 2.6 10/23/2017 0933   MONOABS 0.4 12/05/2017 0445   EOSABS 0.2 12/05/2017 0445   EOSABS 0.1 10/23/2017 0933   BASOSABS 0.0 12/05/2017 0445   BASOSABS 0.0 10/23/2017 0933    BMET    Component Value Date/Time   NA 136 12/07/2017 0510   NA 142 10/23/2017 0933   K 3.7 12/07/2017 0510   CL 106 12/07/2017 0510   CO2 20 (L) 12/07/2017 0510   GLUCOSE 212 (H) 12/07/2017 0510   BUN 19 12/07/2017 0510   BUN 17 10/23/2017 0933   CREATININE 1.31 (H) 12/07/2017 0510   CREATININE 1.05 07/23/2016 0929   CALCIUM 8.4 (L) 12/07/2017 0510   GFRNONAA 56 (L) 12/07/2017 0510   GFRAA >60 12/07/2017 0510    INR    Component Value Date/Time   INR 1.12 12/06/2017 0636     Intake/Output Summary (Last 24 hours) at 12/07/2017 0945 Last data filed at 12/06/2017 2013 Gross per 24 hour  Intake 1940 ml  Output 200 ml  Net 1740 ml      Assessment/Plan:  67 y.o. male is s/p R CEA 1 Day Post-Op   R neck incision unremarkable; without stroke like symptoms post operatively Ok to resume [place] for discharge home from vascular surgery standpoint Follow up in office in 2 weeks; office will call patient to arrange   Dagoberto Ligas, PA-C [facility] [phone] 12/07/2017 9:45 AM

## 2017-12-07 NOTE — Progress Notes (Signed)
Doing well post operatively Resume eliquis when ok with Dr [name]  Routine outpatient cardiology follow-up  Cardiology team to see as needed while here. Please call with questions.  Thompson Grayer MD, [facility] 12/07/2017 10:13 AM

## 2017-12-07 NOTE — Evaluation (Signed)
Physical Therapy Evaluation Patient Details Name: Scott [name] MRN: [id number] DOB: 11-10-1950 Today's Date: 12/07/2017   History of Present Illness  Patient is S/P Carotid Endarectomy on 12/06/2017. PMH: MI, tobacco use, HTN, AFiB, COPD   Clinical Impression  Patient appears to be at baseline mobility. He ambualted 150' with no syncope or dyspnea. He has no need for skilled therapy at this time. D/C form acute PT.     Follow Up Recommendations No PT follow up    Equipment Recommendations       Recommendations for Other Services       Precautions / Restrictions Precautions Precautions: None Restrictions Weight Bearing Restrictions: No      Mobility  Bed Mobility Overal bed mobility: Independent                Transfers Overall transfer level: Independent                  Ambulation/Gait Ambulation/Gait assistance: Independent Ambulation Distance (Feet): 150 Feet Assistive device: None Gait Pattern/deviations: WFL(Within Functional Limits)   Gait velocity interpretation: at or above normal speed for age/gender General Gait Details: No SoB with gait   Stairs            Wheelchair Mobility    Modified Rankin (Stroke Patients Only)       Balance Overall balance assessment: Independent                                           Pertinent Vitals/Pain Pain Assessment: No/denies pain    Home Living Family/patient expects to be discharged to:: Private residence Living Arrangements: Spouse/significant other               Additional Comments: nothing significant     Prior Function Level of Independence: Independent         Comments: became short winded at times.      Hand Dominance   Dominant Hand: Right    Extremity/Trunk Assessment   Upper Extremity Assessment Upper Extremity Assessment: Overall WFL for tasks assessed    Lower Extremity Assessment Lower Extremity Assessment: Overall WFL for tasks  assessed       Communication   Communication: No difficulties  Cognition Arousal/Alertness: Awake/alert Behavior During Therapy: WFL for tasks assessed/performed Overall Cognitive Status: Within Functional Limits for tasks assessed                                        General Comments      Exercises     Assessment/Plan    PT Assessment Patent does not need any further PT services  PT Problem List         PT Treatment Interventions      PT Goals (Current goals can be found in the Care Plan section)  Acute Rehab PT Goals Patient Stated Goal: to go home  PT Goal Formulation: With patient Time For Goal Achievement: 12/10/17 Potential to Achieve Goals: Good    Frequency     Barriers to discharge        Co-evaluation               AM-PAC PT "6 Clicks" Daily Activity  Outcome Measure Difficulty turning over in bed (including adjusting bedclothes, sheets and blankets)?: None Difficulty moving from  lying on back to sitting on the side of the bed? : None Difficulty sitting down on and standing up from a chair with arms (e.g., wheelchair, bedside commode, etc,.)?: None Help needed moving to and from a bed to chair (including a wheelchair)?: None Help needed walking in hospital room?: None Help needed climbing 3-5 steps with a railing? : None 6 Click Score: 24    End of Session     Patient left: in bed Nurse Communication: Mobility status PT Visit Diagnosis: Difficulty in walking, not elsewhere classified (R26.2)    Time: 0240-0250 PT Time Calculation (min) (ACUTE ONLY): 10 min   Charges:   PT Evaluation $PT Eval Low Complexity: 1 Low     PT G Codes:         Carney Living PT DPT  12/07/2017, 2:57 PM

## 2017-12-09 ENCOUNTER — Telehealth: Payer: Self-pay | Admitting: Surgery

## 2017-12-09 ENCOUNTER — Encounter (HOSPITAL_COMMUNITY): Payer: Self-pay | Admitting: Surgery

## 2017-12-09 NOTE — Telephone Encounter (Signed)
-----   Message from Mena Goes, RN sent at 12/08/2017 12:58 PM EDT ----- Regarding: 2 -3 weeks postop CEA   ----- Message ----- From: Iline Oven Sent: 12/06/2017   2:48 PM To: [facility] Charge Pool  Can you schedule an appt for this pt in about 2 weeks to see Dr. [name].  PO R CEA. Thanks, [facility]

## 2017-12-09 NOTE — Telephone Encounter (Signed)
Left pt vm regarding PO appt on 01/06/18  LS

## 2017-12-11 ENCOUNTER — Telehealth: Payer: Self-pay | Admitting: Surgery

## 2017-12-11 NOTE — Telephone Encounter (Signed)
Left pt vm regarding 004/15/19 appt. Mailed letter.

## 2017-12-11 NOTE — Telephone Encounter (Signed)
-----   Message from Mena Goes, RN sent at 12/09/2017 11:06 AM EDT ----- Regarding: may be duplicate    ----- Message ----- From: Serafina Mitchell, MD Sent: 12/07/2017  11:27 PM To: [facility] Charge Pool  12-06-2017:  Surgeon:  Annamarie Major Assistants:  Arlee Muslim Procedure:   #1: right carotid Endarterectomy with bovine pericardial patch angioplasty   #2: Intraoperative carotid duplex   Follow-up with me in 2 weeks

## 2017-12-14 ENCOUNTER — Emergency Department (HOSPITAL_COMMUNITY)
Admission: EM | Admit: 2017-12-14 | Discharge: 2017-12-14 | Disposition: A | Discharge status: Home / Self Care | Payer: BLUE CROSS/BLUE SHIELD | Attending: Emergency Medicine | Admitting: Emergency Medicine

## 2017-12-14 ENCOUNTER — Encounter (HOSPITAL_COMMUNITY): Payer: Self-pay | Admitting: Emergency Medicine

## 2017-12-14 ENCOUNTER — Other Ambulatory Visit: Payer: Self-pay

## 2017-12-14 DIAGNOSIS — Z9889 Other specified postprocedural states: Secondary | ICD-10-CM | POA: Diagnosis not present

## 2017-12-14 DIAGNOSIS — Z5321 Procedure and treatment not carried out due to patient leaving prior to being seen by health care provider: Secondary | ICD-10-CM | POA: Diagnosis not present

## 2017-12-14 DIAGNOSIS — I9789 Other postprocedural complications and disorders of the circulatory system, not elsewhere classified: Secondary | ICD-10-CM

## 2017-12-14 MED ORDER — CEPHALEXIN 500 MG PO CAPS: 500.0000 mg | ORAL_CAPSULE | Freq: Four times a day (QID) | ORAL | 0 refills | Status: DC

## 2017-12-14 NOTE — Discharge Instructions (Signed)
Return here as needed.  Follow-up with your surgeon. °

## 2017-12-14 NOTE — Consult Note (Signed)
Pt had right CEA 8 days ago Had clear yellow drainage from neck today Had fever 1 week ago 102 but none since  Neck opened about 1 cm at bedside about 2 cc clear drainage Culture and gram stain sent  Follow up Dr [name] on Monday Keflex 500 mg qid Pt instructed on how to pack wound  Ruta Hinds, MD [facility] [place]: [phone] Pager: [phone]

## 2017-12-14 NOTE — ED Triage Notes (Signed)
Reports having carotid endarterectomy on the right side a week ago Friday.  Now having swelling and drainage from the site.  Also reports its tender.

## 2017-12-14 NOTE — ED Notes (Signed)
Patient seen and examined by Dr. Oneida Alar , C/S drainage specimen at incision sent to lab .

## 2017-12-15 NOTE — ED Provider Notes (Signed)
The patient appears stable so that the remainder of the MSE may be completed by another provider.  Dr. Oneida Alar was in the room when I went to evaluate the patient.  Patient appears otherwise stable.   Dalia Heading, PA-C 12/15/17 0013    [name], MD 12/15/17 [phone]

## 2017-12-16 ENCOUNTER — Ambulatory Visit (INDEPENDENT_AMBULATORY_CARE_PROVIDER_SITE_OTHER): Payer: BLUE CROSS/BLUE SHIELD | Admitting: Family

## 2017-12-16 ENCOUNTER — Encounter: Payer: Self-pay | Admitting: Family

## 2017-12-16 VITALS — BP 179/111 | HR 85 | Temp 98.8°F | Resp 17 | Ht 70.0 in | Wt 187.0 lb

## 2017-12-16 DIAGNOSIS — I6521 Occlusion and stenosis of right carotid artery: Secondary | ICD-10-CM

## 2017-12-16 DIAGNOSIS — T8141XA Infection following a procedure, superficial incisional surgical site, initial encounter: Secondary | ICD-10-CM

## 2017-12-16 DIAGNOSIS — I779 Disorder of arteries and arterioles, unspecified: Secondary | ICD-10-CM

## 2017-12-16 DIAGNOSIS — T8149XA Infection following a procedure, other surgical site, initial encounter: Secondary | ICD-10-CM

## 2017-12-16 DIAGNOSIS — Z9889 Other specified postprocedural states: Secondary | ICD-10-CM

## 2017-12-16 DIAGNOSIS — Z87891 Personal history of nicotine dependence: Secondary | ICD-10-CM

## 2017-12-16 NOTE — Progress Notes (Signed)
Postoperative Visit   History of Present Illness  Scott [name] is a 67 y.o. male who is s/p right carotid endarterectomy with bovine pericardial patch angioplasty on 12-07-17 by Dr. [name] for symptomatic right carotid stenosis.   Pt had several episodes of right visual changes.  He also described perioral tingling and some clumsiness in his left arm.  He had an MRI which showed several areas of punctate lesions within the right brain consistent with stroke.  Ultrasound showed nearly occlusive lesion in the right carotid artery which was confirmed with CT angiogram.  He denies any subsequent stroke or TIA.  He denies any dysphagia or dyspnea.   He returns today with c/o "bump" that appeared about 4 days ago on the proximal aspect of his incision, seen in the ED 2 days ago by Dr. Oneida Alar, started him on Keflex at that time, placed a gauze wick in the opening where the purulence exuded, taught his wife how to do this and she changes the dressing daily.  Pt states small opening at his incision has been draining large amounts of watery yellow drainage.   He quit smoking a few days before his surgery.  He takes Eliquis for a-fib since 2016, he stopped the Eliquis for surgery as advised, resumed yesterday.    He denies fever or chills.   The patient has had no post operative stroke or TIA symptoms.  For VQI Use Only  PRE-ADM LIVING: Home  AMB STATUS: Ambulatory  Social History   Socioeconomic History  . Marital status: Married    Spouse name: Not on file  . Number of children: 2  . Years of education: Not on file  . Highest education level: Not on file  Occupational History  . Occupation: retired  Scientific laboratory technician  . Financial resource strain: Not on file  . Food insecurity:    Worry: Not on file    Inability: Not on file  . Transportation needs:    Medical: Not on file    Non-medical: Not on file  Tobacco Use  . Smoking status: Former Smoker    Years: 43.00    Types:  Cigarettes    Start date: 06/02/1971    Last attempt to quit: 12/09/2017    Years since quitting: 0.0  . Smokeless tobacco: Never Used  . Tobacco comment: pt vapes  Substance and Sexual Activity  . Alcohol use: Yes    Alcohol/week: 0.0 oz    Comment: occasionaly  . Drug use: No  . Sexual activity: Not on file  Lifestyle  . Physical activity:    Days per week: Not on file    Minutes per session: Not on file  . Stress: Not on file  Relationships  . Social connections:    Talks on phone: Not on file    Gets together: Not on file    Attends religious service: Not on file    Active member of club or organization: Not on file    Attends meetings of clubs or organizations: Not on file    Relationship status: Not on file  . Intimate partner violence:    Fear of current or ex partner: Not on file    Emotionally abused: Not on file    Physically abused: Not on file    Forced sexual activity: Not on file  Other Topics Concern  . Not on file  Social History Narrative   He is originally from [place]. He moved to [facility] in [place].  Previously has traveled to most of the [place]. Has traveled to [place]. Has been to [place], [place], & [place]. 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 Remote bird exposure in 1980. No mold exposure.     Current Outpatient Medications on File Prior to Visit  Medication Sig Dispense Refill  . amLODipine (NORVASC) 10 MG tablet Take 0.5 tablets (5 mg total) by mouth daily. 10 tablet 0  . atorvastatin (LIPITOR) 40 MG tablet Take 1 tablet (40 mg total) by mouth daily. 30 tablet 0  . Carboxymethylcellul-Glycerin (REFRESH OPTIVE OP) Place 1 drop into both eyes every morning.     . cephALEXin (KEFLEX) 500 MG capsule Take 1 capsule (500 mg total) by mouth 4 (four) times daily. 40 capsule 0  . ELIQUIS 5 MG TABS tablet TAKE 1 TABLET(5 MG) BY MOUTH TWICE DAILY (Patient taking differently: Take 5 mg by mouth two times a day) 60 tablet 7  .  fenofibrate (TRICOR) 145 MG tablet Take 145 mg by mouth daily.    Marland Kitchen HYDROcodone-acetaminophen (NORCO/VICODIN) 5-325 MG tablet Take 1-2 tablets by mouth every 6 (six) hours as needed (for pain). 8 tablet 0  . nicotine (NICODERM CQ - DOSED IN MG/24 HOURS) 21 mg/24hr patch Place 1 patch (21 mg total) onto the skin daily. 28 patch 0  . ofloxacin (OCUFLOX) 0.3 % ophthalmic solution Instill 1 drop in the affected eye every hour for the first 4-5 hours then use 1 drop in the affected eye twice daily as needed for infection     No current facility-administered medications on file prior to visit.     Physical Examination  Vitals:   12/16/17 1325 12/16/17 1328  BP: (!) 193/99 (!) 179/111  Pulse: 85   Resp: 17   Temp: 98.8 F (37.1 C)   TempSrc: Oral   SpO2: 96%   Weight: 187 lb (84.8 kg)   Height: 5\' 10"  (1.778 m)    Body mass index is 26.83 kg/m.     Right side of neck incision with small opening at the proximal end, currently no drainage, no erythema. Neuro: CN 2-12 are intact, Motor strength is 5/5 bilaterally, sensation is grossly intact  Medical Decision Making  Scott [name] is a 67 y.o. male who presents s/p s/p right carotid endarterectomy with bovine pericardial patch angioplasty on 12-07-17 by Dr. [name] for symptomatic right carotid stenosis.    Healing superficial incisional infection.   Dr. [name] spoke with and examined pt.  Continue wick dressing change by wife daily Continue antibx as prescribed. Scott [name] appointment in 2 days whith PA, instead see Dr. [name] next week for right side neck incision wound check.  He is scheduled to return in May for LE arterial studies and see me.   I advised pt to see his PCP ASAP re hisi uncontrolled blood pressure.     Scott Chambers, RN, MSN, FNP-C [facility] Office: [phone]  12/16/2017, 1:49 PM  Clinic MD: [name]

## 2017-12-17 LAB — AEROBIC CULTURE  (SUPERFICIAL SPECIMEN): CULTURE: NORMAL

## 2017-12-17 LAB — AEROBIC CULTURE W GRAM STAIN (SUPERFICIAL SPECIMEN)

## 2017-12-18 ENCOUNTER — Telehealth: Payer: Self-pay | Admitting: Emergency Medicine

## 2017-12-18 NOTE — Telephone Encounter (Signed)
Post ED Visit - Positive Culture Follow-up  Culture report reviewed by antimicrobial stewardship pharmacist:  []  Elenor Quinones, Pharm.D. []  Heide Guile, Pharm.D., BCPS AQ-ID []  Parks Neptune, Pharm.D., BCPS [x]  Alycia Rossetti, Pharm.D., BCPS []  [place], [place].D., BCPS, AAHIVP []  Legrand Como, Pharm.D., BCPS, AAHIVP []  Salome Arnt, PharmD, BCPS []  Jalene Mullet, PharmD []  Vincenza Hews, PharmD, BCPS  Positive wound culture Treated with cephalexin, organism sensitive to the same and no further patient follow-up is required at this time.  Hazle Nordmann 12/18/2017, 1:00 PM

## 2017-12-19 ENCOUNTER — Other Ambulatory Visit: Payer: Self-pay | Admitting: Family Medicine

## 2017-12-19 DIAGNOSIS — M5412 Radiculopathy, cervical region: Secondary | ICD-10-CM

## 2017-12-23 ENCOUNTER — Encounter: Payer: Self-pay | Admitting: Surgery

## 2017-12-23 ENCOUNTER — Ambulatory Visit (INDEPENDENT_AMBULATORY_CARE_PROVIDER_SITE_OTHER): Payer: Self-pay | Admitting: Surgery

## 2017-12-23 ENCOUNTER — Other Ambulatory Visit: Payer: Self-pay

## 2017-12-23 VITALS — BP 153/96 | HR 75 | Temp 97.2°F | Resp 16 | Ht 70.0 in | Wt 187.0 lb

## 2017-12-23 DIAGNOSIS — I6521 Occlusion and stenosis of right carotid artery: Secondary | ICD-10-CM

## 2017-12-23 NOTE — Progress Notes (Signed)
Patient name: Scott [name] MRN: [id number] DOB: 11-16-1950 Sex: male  REASON FOR VISIT:     post op  HISTORY OF PRESENT ILLNESS:   Scott [name] is a 67 y.o. male who presented to the hospital with several episodes of right-sided visual changes as well as some episodes of perioral tingling and clumsiness in his left arm.  A MRI showed several areas of punctate lesions within the right brain consistent with stroke.  He had a CT angiogram that showed a high-grade right carotid stenosis.  Therefore on 12/07/2017 he underwent right carotid endarterectomy with patch angioplasty.  Intraoperative findings included a 95% stenosis.  He had extensive calcified plaque within the distal common carotid artery and internal carotid artery which extended well up underneath the hypoglossal nerve.  The patient returned following discharge with what looks like a stitch abscess in his incision.  This was opened and he has been doing packing.  He has been on antibiotics.  He still reports some clumsiness in his left arm  CURRENT MEDICATIONS:    Current Outpatient Medications  Medication Sig Dispense Refill  . amLODipine (NORVASC) 10 MG tablet Take 0.5 tablets (5 mg total) by mouth daily. 10 tablet 0  . atorvastatin (LIPITOR) 40 MG tablet Take 1 tablet (40 mg total) by mouth daily. 30 tablet 0  . Carboxymethylcellul-Glycerin (REFRESH OPTIVE OP) Place 1 drop into both eyes every morning.     . cephALEXin (KEFLEX) 500 MG capsule Take 1 capsule (500 mg total) by mouth 4 (four) times daily. 40 capsule 0  . ELIQUIS 5 MG TABS tablet TAKE 1 TABLET(5 MG) BY MOUTH TWICE DAILY (Patient taking differently: Take 5 mg by mouth two times a day) 60 tablet 7  . fenofibrate (TRICOR) 145 MG tablet Take 145 mg by mouth daily.    Marland Kitchen HYDROcodone-acetaminophen (NORCO/VICODIN) 5-325 MG tablet Take 1-2 tablets by mouth every 6 (six) hours as needed (for pain). 8 tablet 0  . ofloxacin (OCUFLOX) 0.3  % ophthalmic solution Instill 1 drop in the affected eye every hour for the first 4-5 hours then use 1 drop in the affected eye twice daily as needed for infection    . nicotine (NICODERM CQ - DOSED IN MG/24 HOURS) 21 mg/24hr patch Place 1 patch (21 mg total) onto the skin daily. (Patient not taking: Reported on 12/23/2017) 28 patch 0   No current facility-administered medications for this visit.     REVIEW OF SYSTEMS:   [X]  denotes positive finding, [ ]  denotes negative finding Cardiac  Comments:  Chest pain or chest pressure:    Shortness of breath upon exertion:    Short of breath when lying flat:    Irregular heart rhythm:    Constitutional    Fever or chills:      PHYSICAL EXAM:   Vitals:   12/23/17 1329 12/23/17 1334  BP: (!) 145/92 (!) 153/96  Pulse: 75   Resp: 16   Temp: (!) 97.2 F (36.2 C)   TempSrc: Oral   SpO2: 96%   Weight: 187 lb (84.8 kg)   Height: 5\' 10"  (1.778 m)     GENERAL: The patient is a well-nourished male, in no acute distress. The vital signs are documented above. CARDIOVASCULAR: There is a regular rate and rhythm. PULMONARY: Non-labored respirations The superior aspect of the incision remains open.  This does not tunnel anywhere.  There is healthy tissue.  The depth is approximately 1 mm.  STUDIES:   None  MEDICAL ISSUES:   I am ordering outpatient physical therapy for an assessment of his left arm, which still has some clumsiness present which was present prior to his operation  The patient will follow-up in 5 weeks for a wound check when he gets back from [place].  I have been scheduled for repeat carotid ultrasound in 6 months  He will follow-up with Dr. Oneida Alar as previously scheduled.  Annamarie Major, MD [facility] [phone] Pager [phone]

## 2017-12-24 NOTE — ED Provider Notes (Signed)
This patient was seen by Dr. Oneida Alar in the ED upon pt's arrival, so the ED physician did not see this patient.   [name], MD 12/24/17 [phone]

## 2017-12-25 ENCOUNTER — Ambulatory Visit
Admission: RE | Admit: 2017-12-25 | Discharge: 2017-12-25 | Disposition: A | Discharge status: Home / Self Care | Payer: BLUE CROSS/BLUE SHIELD | Source: Ambulatory Visit | Attending: Family Medicine | Admitting: Family Medicine

## 2017-12-25 DIAGNOSIS — M5412 Radiculopathy, cervical region: Secondary | ICD-10-CM

## 2017-12-27 ENCOUNTER — Other Ambulatory Visit: Payer: Self-pay

## 2017-12-27 DIAGNOSIS — I6521 Occlusion and stenosis of right carotid artery: Secondary | ICD-10-CM

## 2018-01-06 ENCOUNTER — Encounter: Payer: BLUE CROSS/BLUE SHIELD | Admitting: Surgery

## 2018-01-07 ENCOUNTER — Other Ambulatory Visit: Payer: Self-pay

## 2018-01-07 DIAGNOSIS — I779 Disorder of arteries and arterioles, unspecified: Secondary | ICD-10-CM

## 2018-01-21 ENCOUNTER — Ambulatory Visit: Payer: BLUE CROSS/BLUE SHIELD | Admitting: Cardiovascular Disease

## 2018-01-27 ENCOUNTER — Telehealth: Payer: Self-pay | Admitting: Cardiovascular Disease

## 2018-01-27 NOTE — Telephone Encounter (Signed)
New message  Pt verbalzied that he is calling for RN  Pt want some labs done before coming to see Dr.Nasher in July

## 2018-01-28 NOTE — Telephone Encounter (Signed)
Left message for patient to call back to clarify whether he will come ahead of his appointment with Dr. Acie Fredrickson on 7/2 to get his lab work

## 2018-01-28 NOTE — Telephone Encounter (Signed)
Spoke with patient who is calling to request a sooner appointment to discuss options for treating a fib. He states he is frequently too fatigued and weak to enjoy outdoor activities with his family and he is very discouraged. He asks if an a fib ablation would be an option for him. He states he would like to see Dr. Acie Fredrickson or have Dr. Acie Fredrickson refer him to one of our EP doctors to discuss the ablation. He is scheduled to see Dr. Acie Fredrickson on 7/2. I advised I will discuss whether we need a sooner appointment with Dr. Acie Fredrickson or if patient can be scheduled with Dr. Rayann Heman or [facility] to discuss the ablation prior to July 2. Patient was grateful for my help and will await a call back.

## 2018-01-28 NOTE — Telephone Encounter (Signed)
New message ° °Pt verbalized that he is returning call for RN °

## 2018-01-29 NOTE — Telephone Encounter (Signed)
Lets schedule him an appt to see EP for discussion of A-Fib ablation.

## 2018-01-29 NOTE — Telephone Encounter (Signed)
Patient is scheduled to see Dr. Rayann Heman on Monday 5/13 and was made aware of appointment by Lorenda Hatchet, [facility]

## 2018-02-03 ENCOUNTER — Ambulatory Visit (INDEPENDENT_AMBULATORY_CARE_PROVIDER_SITE_OTHER)
Admission: RE | Admit: 2018-02-03 | Discharge: 2018-02-03 | Disposition: A | Discharge status: Home / Self Care | Payer: BLUE CROSS/BLUE SHIELD | Source: Ambulatory Visit | Attending: Family | Admitting: Family

## 2018-02-03 ENCOUNTER — Encounter: Payer: Self-pay | Admitting: Family

## 2018-02-03 ENCOUNTER — Ambulatory Visit (INDEPENDENT_AMBULATORY_CARE_PROVIDER_SITE_OTHER): Payer: BLUE CROSS/BLUE SHIELD | Admitting: Family

## 2018-02-03 ENCOUNTER — Telehealth: Payer: Self-pay | Admitting: Pharmacist

## 2018-02-03 ENCOUNTER — Ambulatory Visit (INDEPENDENT_AMBULATORY_CARE_PROVIDER_SITE_OTHER): Payer: BLUE CROSS/BLUE SHIELD | Admitting: Internal Medicine

## 2018-02-03 ENCOUNTER — Ambulatory Visit (HOSPITAL_COMMUNITY)
Admission: RE | Admit: 2018-02-03 | Discharge: 2018-02-03 | Disposition: A | Discharge status: Home / Self Care | Payer: BLUE CROSS/BLUE SHIELD | Source: Ambulatory Visit | Attending: Family | Admitting: Family

## 2018-02-03 ENCOUNTER — Encounter: Payer: Self-pay | Admitting: Internal Medicine

## 2018-02-03 VITALS — BP 124/82 | HR 65 | Ht 70.0 in | Wt 186.0 lb

## 2018-02-03 VITALS — BP 152/93 | HR 61 | Temp 96.9°F | Resp 16 | Ht 70.0 in | Wt 188.0 lb

## 2018-02-03 DIAGNOSIS — F17200 Nicotine dependence, unspecified, uncomplicated: Secondary | ICD-10-CM | POA: Diagnosis present

## 2018-02-03 DIAGNOSIS — I779 Disorder of arteries and arterioles, unspecified: Secondary | ICD-10-CM

## 2018-02-03 DIAGNOSIS — Z95820 Peripheral vascular angioplasty status with implants and grafts: Secondary | ICD-10-CM | POA: Diagnosis not present

## 2018-02-03 DIAGNOSIS — Z9889 Other specified postprocedural states: Secondary | ICD-10-CM

## 2018-02-03 DIAGNOSIS — I6521 Occlusion and stenosis of right carotid artery: Secondary | ICD-10-CM

## 2018-02-03 DIAGNOSIS — R0683 Snoring: Secondary | ICD-10-CM | POA: Diagnosis not present

## 2018-02-03 DIAGNOSIS — R5383 Other fatigue: Secondary | ICD-10-CM

## 2018-02-03 DIAGNOSIS — I4891 Unspecified atrial fibrillation: Secondary | ICD-10-CM

## 2018-02-03 NOTE — Progress Notes (Signed)
Postoperative Visit   History of Present Illness  Scott [name] is a 67 y.o. male who presented to the hospital with several episodes of right-sided visual changes as well as some episodes of perioral tingling and clumsiness in his left arm.  A MRI showed several areas of punctate lesions within the right brain consistent with stroke.  He had a CT angiogram that showed a high-grade right carotid stenosis.  Therefore on 12/07/2017 he underwent right carotid endarterectomy with patch angioplasty by Dr. [name].  Intraoperative findings included a 95% stenosis.  He had extensive calcified plaque within the distal common carotid artery and internal carotid artery which extended well up underneath the hypoglossal nerve.  The patient returned following discharge with what appeared to be a stitch abscess in his incision.  This was opened and he had been doing packing.  He had been on antibiotics.  He reports no more clumsiness in his left arm.   Dr. [name] last evaluated pt on 12-23-17. At that time the superior aspect of the incision remained open.  This did not tunnel anywhere.  There was healthy tissue.  The depth was approximately 1 mm. Dr. [name] ordered outpatient physical therapy for an assessment of his left arm, which had some residual clumsiness present which was present prior to his operation. The patient was to follow-up in 5 weeks for a wound check when he gets back from [place]. Dr. [name] requested repeat carotid ultrasound in 6 months. He was to follow-up with Dr. Oneida Alar as previously scheduled.  Pt did not get the physical therapy that Dr. Radene Gunning advised; he is working with a physical therapist who s a family friend to strengthen and improve dexterity in his left arm, which he states feels about normal.    He is scheduled for carotid duplex and see Dr. [name] on 06-30-18.   He returns today for a wound check of his neck. The right side neck incision has healed.   He  has not had any subsequent stroke or TIA. He resumed smoking, at 1/4 ppd.   He is also s/p right superficial femoral artery stenting on 01/25/2017 by Dr. Oneida Alar.  He states his right calf claudication has resolved; the more he walks the better his legs feel. He indicates mild to moderate bilateral knee pain, had injuries to knee from playing basketball.   He is on Eliquis for atrial fibrillation. He was placed on a short course of Plavix and aspirinperiprocedure but is now off of this.   He is fatigued, feels this is due to his atrial fib, feels his legs are wobbly and weak in the morning, but once he gets going he can walk a couple of miles.He states he will be hospitalized for a Tikosyn trial and cardioversion if needed.   Dr. Oneida Alar last evaluated pt on 02-21-17. At that time pt had 2+ posterior tibial pulses bilaterally, absent dorsalis pedis pulse (known dorsalis pedis occlusion right foot chronic. Dr. Oneida Alar advised pt to continue to try to quit smoking. He will remain on his Eliquis for his atrial fibrillation. If this is stopped at any point in the future he would need to be put on Plavix and aspirin. He will follow-up with [place] with a duplex of his stent and bilateral ABIs in 6 months time. He will see our nurse practitioner that office visit.  He had a mild MI in 1996.   Pt Diabetic: No Pt smoker: current smoker, 1/4 ppd  (was 3/4 ppd,  started in 1972)  Pt meds include: Statin :Yes Betablocker: No ASA: No Other anticoagulants/antiplatelets: Eliquis for atrial fib    For VQI Use Only  PRE-ADM LIVING: Home  AMB STATUS: Ambulatory  Social History   Socioeconomic History  . Marital status: Married    Spouse name: Not on file  . Number of children: 2  . Years of education: Not on file  . Highest education level: Not on file  Occupational History  . Occupation: retired  Scientific laboratory technician  . Financial resource strain: Not on file  . Food insecurity:    Worry: Not on file     Inability: Not on file  . Transportation needs:    Medical: Not on file    Non-medical: Not on file  Tobacco Use  . Smoking status: Former Smoker    Years: 43.00    Types: Cigarettes    Start date: 06/02/1971    Last attempt to quit: 12/09/2017    Years since quitting: 0.1  . Smokeless tobacco: Never Used  . Tobacco comment: pt vapes  Substance and Sexual Activity  . Alcohol use: Yes    Alcohol/week: 0.0 oz    Comment: occasionaly  . Drug use: No  . Sexual activity: Not on file  Lifestyle  . Physical activity:    Days per week: Not on file    Minutes per session: Not on file  . Stress: Not on file  Relationships  . Social connections:    Talks on phone: Not on file    Gets together: Not on file    Attends religious service: Not on file    Active member of club or organization: Not on file    Attends meetings of clubs or organizations: Not on file    Relationship status: Not on file  . Intimate partner violence:    Fear of current or ex partner: Not on file    Emotionally abused: Not on file    Physically abused: Not on file    Forced sexual activity: Not on file  Other Topics Concern  . Not on file  Social History Narrative   He is originally from [place]. He moved to [facility] in [place]. Previously has traveled to most of the [place]. Has traveled to [place]. Has been to [place], [place], & [place]. 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 Remote bird exposure in 1980. No mold exposure.     Current Outpatient Medications on File Prior to Visit  Medication Sig Dispense Refill  . atorvastatin (LIPITOR) 40 MG tablet Take 1 tablet (40 mg total) by mouth daily. 30 tablet 0  . ELIQUIS 5 MG TABS tablet TAKE 1 TABLET(5 MG) BY MOUTH TWICE DAILY (Patient taking differently: Take 5 mg by mouth two times a day) 60 tablet 7  . fenofibrate (TRICOR) 145 MG tablet Take 145 mg by mouth daily.    Marland Kitchen HYDROcodone-acetaminophen (NORCO/VICODIN) 5-325 MG tablet  Take 1-2 tablets by mouth every 6 (six) hours as needed (for pain). 8 tablet 0  . losartan (COZAAR) 25 MG tablet Take 25 mg by mouth daily.    . nicotine (NICODERM CQ - DOSED IN MG/24 HOURS) 21 mg/24hr patch Place 1 patch (21 mg total) onto the skin daily. 28 patch 0  . ofloxacin (OCUFLOX) 0.3 % ophthalmic solution Instill 1 drop in the affected eye every hour for the first 4-5 hours then use 1 drop in the affected eye twice daily as needed for  infection    . Carboxymethylcellul-Glycerin (REFRESH OPTIVE OP) Place 1 drop into both eyes every morning.     . cephALEXin (KEFLEX) 500 MG capsule Take 1 capsule (500 mg total) by mouth 4 (four) times daily. (Patient not taking: Reported on 02/03/2018) 40 capsule 0   No current facility-administered medications on file prior to visit.     Physical Examination  Vitals:   02/03/18 1521 02/03/18 1526  BP: (!) 161/96 (!) 152/93  Pulse: 61   Resp: 16   Temp: (!) 96.9 F (36.1 C)   TempSrc: Oral   SpO2: 97%   Weight: 188 lb (85.3 kg)   Height: 5\' 10"  (1.778 m)    Body mass index is 26.98 kg/m.   DATA    Right side of neck   Right side of neck: Incision is healed Neuro: CN 2-12 are intact, Motor strength is 5/5 bilaterally, sensation is grossly intact. Bilateral femoral, popliteal, and PT pulses are 2+ palpable.   Medical Decision Making  Scott [name] is a 67 y.o. male who presents s/p right carotid endarterectomy with patch angioplasty by Dr. [name] on 12-07-17. He had a pre-operative stroke, left hand clumsiness, a family friend helped him with PT for his left arm, and he states his arm feels about normal.  He has not had any subsequent stroke or TIA. Apparently he quit smoking for a short while after the right CEA, but resumed.   Over 3 minutes was spent counseling patient re smoking cessation, and patient was given several free resources re smoking cessation.   He is also s/p right superficial femoral artery stenting on 01/25/2017  by Dr. Oneida Alar.    Follow up on 06-29-18 with carotid duplex as already scheduled and see Dr. [name], and will add ABI's and right LE arterial duplex for that visit.   [name] neck incision has healed with no stroke symptoms. I discussed in depth with the patient the nature of atherosclerosis, and emphasized the importance of maximal medical management including strict control of blood pressure, blood glucose, and lipid levels, obtaining regular exercise, anti-platelet use and cessation of smoking.    The patient is aware that without maximal medical management the underlying atherosclerotic disease process will progress, limiting the benefit of any interventions.  I emphasized the importance of routine surveillance of the carotid arteries as recurrence of stenosis is possible, especially with proper management of underlying atherosclerotic disease. The patient agrees to participate in their maximal medical care and routine surveillance.  Thank you for allowing [place] to participate in this patient's care.  [name], RN, MSN, FNP-C [facility] Office: [phone]  02/03/2018, 3:41 PM  Clinic MD: [name]

## 2018-02-03 NOTE — Telephone Encounter (Signed)
Medication list reviewed in anticipation of upcoming Tikosyn initiation. Patient is not taking any contraindicated or QTc prolonging medications.   Patient is anticoagulated on Eliquis 5mg BID on the appropriate dose. Please ensure that patient has not missed any anticoagulation doses in the 3 weeks prior to Tikosyn initiation.   Patient will need to be counseled to avoid use of Benadryl while on Tikosyn and in the 2-3 days prior to Tikosyn initiation. 

## 2018-02-03 NOTE — Progress Notes (Signed)
Electrophysiology Office Note   Date:  02/03/2018   ID:  Scott [name], DOB 02/15/1951, MRN [id number]  PCP:  Shirline Frees, MD  Cardiologist:  Dr Acie Fredrickson Primary Electrophysiologist: Thompson Grayer, MD    CC: afib   History of Present Illness: Scott [name] is a 67 y.o. male who presents today for electrophysiology evaluation.   The patient is referred today for EP consultation regarding atrial fibrillation by Dr Acie Fredrickson.  He was initially diagnosed with atrial fibrillation 2016 after presenting with fatigue.   He has been persistently in afib.  He did undergo cardioversion 06/29/2016 but quickly returned to afib.  He has not tried AAD therapy.  He is on eliquis for stroke prevention.  He has marked fatigue and states "I just want my life back".  He snores and does not feel well rested upon waking.  He wakes multiple times during the night.  Today, he denies symptoms of palpitations, chest pain, shortness of breath, orthopnea, PND, lower extremity edema, claudication, dizziness, presyncope, syncope, bleeding, or neurologic sequela. The patient is tolerating medications without difficulties and is otherwise without complaint today.    Past Medical History:  Diagnosis Date  . CAD (coronary artery disease)   . Cerebrovascular disease   . HLD (hyperlipidemia)   . HTN (hypertension)   . IBS (irritable bowel syndrome)   . MI, old   . Persistent atrial fibrillation ([place])   . PVD (peripheral vascular disease) ([place])   . Stroke (cerebrum) ([place])   . Tobacco abuse    Past Surgical History:  Procedure Laterality Date  . ABDOMINAL AORTOGRAM W/LOWER EXTREMITY N/A 01/25/2017   Procedure: Abdominal Aortogram w/Lower Extremity;  Surgeon: Elam Dutch, MD;  Location: [facility];  Service: Cardiovascular;  Laterality: N/A;  . ANTERIOR CRUCIATE LIGAMENT REPAIR    . CARDIAC CATHETERIZATION     [place]  . CARDIAC CATHETERIZATION N/A 07/27/2016   Procedure: Left Heart Cath and Coronary  Angiography;  Surgeon: Sherren Mocha, MD;  Location: [facility];  Service: Cardiovascular;  Laterality: N/A;  . CARDIOVERSION N/A 06/29/2016   Procedure: CARDIOVERSION;  Surgeon: Fay Records, MD;  Location[facility];  Service: Cardiovascular;  Laterality: N/A;  . CHOLECYSTECTOMY    . ENDARTERECTOMY Right 12/06/2017   Procedure: ENDARTERECTOMY CAROTID RIGHT;  Surgeon: Serafina Mitchell, MD;  Location: [facility];  Service: Vascular;  Laterality: Right;  . [place], 2000   x 4 times.  Marland Kitchen PATCH ANGIOPLASTY Right 12/06/2017   Procedure: PATCH ANGIOPLASTY RIGHT CAROTID ARTERY;  Surgeon: Serafina Mitchell, MD;  Location: [place];  Service: Vascular;  Laterality: Right;  . PERIPHERAL VASCULAR INTERVENTION Right 01/25/2017   Procedure: Peripheral Vascular Intervention;  Surgeon: Elam Dutch, MD;  Location: [place];  Service: Cardiovascular;  Laterality: Right;  . TONSILLECTOMY       Current Outpatient Medications  Medication Sig Dispense Refill  . amLODipine (NORVASC) 10 MG tablet Take 10 mg by mouth daily.    Marland Kitchen atorvastatin (LIPITOR) 40 MG tablet Take 1 tablet (40 mg total) by mouth daily. 30 tablet 0  . Carboxymethylcellul-Glycerin (REFRESH OPTIVE OP) Place 1 drop into both eyes every morning.     . cephALEXin (KEFLEX) 500 MG capsule Take 1 capsule (500 mg total) by mouth 4 (four) times daily. 40 capsule 0  . ELIQUIS 5 MG TABS tablet TAKE 1 TABLET(5 MG) BY MOUTH TWICE DAILY (Patient taking differently: Take 5 mg by mouth  two times a day) 60 tablet 7  . fenofibrate (TRICOR) 145 MG tablet Take 145 mg by mouth daily.    Marland Kitchen HYDROcodone-acetaminophen (NORCO/VICODIN) 5-325 MG tablet Take 1-2 tablets by mouth every 6 (six) hours as needed (for pain). 8 tablet 0  . nicotine (NICODERM CQ - DOSED IN MG/24 HOURS) 21 mg/24hr patch Place 1 patch (21 mg total) onto the skin daily. 28 patch 0  . ofloxacin (OCUFLOX) 0.3 % ophthalmic solution Instill 1 drop in the affected eye  every hour for the first 4-5 hours then use 1 drop in the affected eye twice daily as needed for infection     No current facility-administered medications for this visit.     Allergies:   Patient has no known allergies.   Social History:  The patient  reports that he quit smoking about 8 weeks ago. His smoking use included cigarettes. He started smoking about 46 years ago. He quit after 43.00 years of use. He has never used smokeless tobacco. He reports that he drinks alcohol. He reports that he does not use drugs.   Family History:  [name]  family history includes Arthritis in his mother and sister; Heart disease in his father; Hypertension in his mother; Melanoma in his father; Testicular cancer in his father.    ROS:  Please see the history of present illness.   All other systems are personally reviewed and negative.    PHYSICAL EXAM: VS:  BP 124/82   Pulse 65   Ht 5\' 10"  (1.778 m)   Wt 186 lb (84.4 kg)   BMI 26.69 kg/m  , BMI Body mass index is 26.69 kg/m. GEN: Well nourished, well developed, in no acute distress  HEENT: normal  Neck: no JVD, carotid bruits, or masses, R neck surgical scar is healing Cardiac: iRRR; no murmurs, rubs, or gallops,no edema  Respiratory:  clear to auscultation bilaterally, normal work of breathing GI: soft, nontender, nondistended, + BS MS: no deformity or atrophy  Skin: warm and dry  Neuro:  Strength and sensation are intact Psych: euthymic mood, full affect  EKG:  EKG is ordered today. The ekg ordered today is personally reviewed and shows afib, V rates 65, Qtc 440 msec   Recent Labs: 10/23/2017: ALT 22; TSH 1.480 12/05/2017: Magnesium 1.7 12/07/2017: BUN 19; Creatinine, Ser 1.20; Hemoglobin 12.7; Platelets 245; Potassium 3.7; Sodium 136  personally reviewed   Lipid Panel     Component Value Date/Time   CHOL 125 12/05/2017 0858   CHOL 146 10/23/2017 0933   TRIG 51 12/05/2017 0858   HDL 40 (L) 12/05/2017 0858   HDL 37 (L)  10/23/2017 0933   CHOLHDL 3.1 12/05/2017 0858   VLDL 10 12/05/2017 0858   LDLCALC 75 12/05/2017 0858   LDLCALC 88 10/23/2017 0933   personally reviewed   Wt Readings from Last 3 Encounters:  02/03/18 186 lb (84.4 kg)  12/23/17 187 lb (84.8 kg)  12/16/17 187 lb (84.8 kg)      Other studies personally reviewed: Additional studies/ records that were reviewed today include: prior echo, Dr Elmarie Shiley notes  Review of the above records today demonstrates: as above,  Moderate LA enlargment preserved EF   ASSESSMENT AND PLAN:  1.  Longstanding persistent afib The patient has symptomatic afib.  He has had unsuccessful cardioversion previously.  He has not tried AAD therapy.  He is rate controlled. On eliquis for chads2vasc score of at least 4. Therapeutic strategies for afib including medicine (multaq, tikosyn, and  amiodarone) and ablation were discussed in detail with the patient today. Risk, benefits, and alternatives to each were discussed.  As per guidelines for persistent afib, we should try AAD therapy prior to ablation. He would prefer tikosyn.  I think that this is his best option.  Compliance with eliquis is advised.  We will admit for tikosyn after he has had no missed doses for at least 3 weeks. Will order an echo when he is in the hospital for [place].    2. Snoring He snores and does not feel well rested upon waking.  He wakes multiple times during the night.  I will refer to Dr Maxwell Caul for sleep evaluation.  3. HTN Stable No change required today  4. CVD/ prior stroke Importance of anticoagulation discussed today  Follow-up:  AF clinic in 3 weeks  Current medicines are reviewed at length with the patient today.   The patient does not have concerns regarding his medicines.  The following changes were made today:  none  Labs/ tests ordered today include:  Orders Placed This Encounter  Procedures  . EKG 12-Lead     Signed, Thompson Grayer, MD  02/03/2018 9:46 AM      [facility] [address] [place] [address] [phone] (office) [phone] (fax)

## 2018-02-03 NOTE — Patient Instructions (Addendum)
Steps to Quit Smoking Smoking tobacco can be bad for your health. It can also affect almost every organ in your body. Smoking puts you and people around you at risk for many serious long-lasting (chronic) diseases. Quitting smoking is hard, but it is one of the best things that you can do for your health. It is never too late to quit. What are the benefits of quitting smoking? When you quit smoking, you lower your risk for getting serious diseases and conditions. They can include:  Lung cancer or lung disease.  Heart disease.  Stroke.  Heart attack.  Not being able to have children (infertility).  Weak bones (osteoporosis) and broken bones (fractures).  If you have coughing, wheezing, and shortness of breath, those symptoms may get better when you quit. You may also get sick less often. If you are pregnant, quitting smoking can help to lower your chances of having a baby of low birth weight. What can I do to help me quit smoking? Talk with your doctor about what can help you quit smoking. Some things you can do (strategies) include:  Quitting smoking totally, instead of slowly cutting back how much you smoke over a period of time.  Going to in-person counseling. You are more likely to quit if you go to many counseling sessions.  Using resources and support systems, such as: ? Online chats with a counselor. ? Phone quitlines. ? Printed self-help materials. ? Support groups or group counseling. ? Text messaging programs. ? Mobile phone apps or applications.  Taking medicines. Some of these medicines may have nicotine in them. If you are pregnant or breastfeeding, do not take any medicines to quit smoking unless your doctor says it is okay. Talk with your doctor about counseling or other things that can help you.  Talk with your doctor about using more than one strategy at the same time, such as taking medicines while you are also going to in-person counseling. This can help make  quitting easier. What things can I do to make it easier to quit? Quitting smoking might feel very hard at first, but there is a lot that you can do to make it easier. Take these steps:  Talk to your family and friends. Ask them to support and encourage you.  Call phone quitlines, reach out to support groups, or work with a counselor.  Ask people who smoke to not smoke around you.  Avoid places that make you want (trigger) to smoke, such as: ? Bars. ? Parties. ? Smoke-break areas at work.  Spend time with people who do not smoke.  Lower the stress in your life. Stress can make you want to smoke. Try these things to help your stress: ? Getting regular exercise. ? Deep-breathing exercises. ? Yoga. ? Meditating. ? Doing a body scan. To do this, close your eyes, focus on one area of your body at a time from head to toe, and notice which parts of your body are tense. Try to relax the muscles in those areas.  Download or buy apps on your mobile phone or tablet that can help you stick to your quit plan. There are many free apps, such as QuitGuide from the CDC (Centers for Disease Control and Prevention). You can find more support from smokefree.gov and other websites.  This information is not intended to replace advice given to you by your health care provider. Make sure you discuss any questions you have with your health care provider. Document Released: 07/07/2009 Document   Revised: 05/08/2016 Document Reviewed: 01/25/2015 Elsevier Interactive Patient Education  2018 [employer]   Stroke Prevention Some health problems and behaviors may make it more likely for you to have a stroke. Below are ways to lessen your risk of having a stroke.  Be active for at least 30 minutes on most or all days.  Do not smoke. Try not to be around others who smoke.  Do not drink too much alcohol. ? Do not have more than 2 drinks a day if you are a man. ? Do not have more than 1 drink a day if you are a  woman and are not pregnant.  Eat healthy foods, such as fruits and vegetables. If you were put on a specific diet, follow the diet as told.  Keep your cholesterol levels under control through diet and medicines. Look for foods that are low in saturated fat, trans fat, cholesterol, and are high in fiber.  If you have diabetes, follow all diet plans and take your medicine as told.  Ask your doctor if you need treatment to lower your blood pressure. If you have high blood pressure (hypertension), follow all diet plans and take your medicine as told by your doctor.  If you are 18-39 years old, have your blood pressure checked every 3-5 years. If you are age 40 or older, have your blood pressure checked every year.  Keep a healthy weight. Eat foods that are low in calories, salt, saturated fat, trans fat, and cholesterol.  Do not take drugs.  Avoid birth control pills, if this applies. Talk to your doctor about the risks of taking birth control pills.  Talk to your doctor if you have sleep problems (sleep apnea).  Take all medicine as told by your doctor. ? You may be told to take aspirin or blood thinner medicine. Take this medicine as told by your doctor. ? Understand your medicine instructions.  Make sure any other conditions you have are being taken care of.  Get help right away if:  You suddenly lose feeling (you feel numb) or have weakness in your face, arm, or leg.  Your face or eyelid hangs down to one side.  You suddenly feel confused.  You have trouble talking (aphasia) or understanding what people are saying.  You suddenly have trouble seeing in one or both eyes.  You suddenly have trouble walking.  You are dizzy.  You lose your balance or your movements are clumsy (uncoordinated).  You suddenly have a very bad headache and you do not know the cause.  You have new chest pain.  Your heart feels like it is fluttering or skipping a beat (irregular heartbeat). Do  not wait to see if the symptoms above go away. Get help right away. Call your local emergency services (911 in [place] Do not drive yourself to the hospital. This information is not intended to replace advice given to you by your health care provider. Make sure you discuss any questions you have with your health care provider. Document Released: 03/11/2012 Document Revised: 02/16/2016 Document Reviewed: 03/13/2013 Elsevier Interactive Patient Education  2018 [employer].     Peripheral Vascular Disease Peripheral vascular disease (PVD) is a disease of the blood vessels that are not part of your heart and brain. A simple term for PVD is poor circulation. In most cases, PVD narrows the blood vessels that carry blood from your heart to the rest of your body. This can result in a decreased supply of blood to   your arms, legs, and internal organs, like your stomach or kidneys. However, it most often affects a person's lower legs and feet. There are two types of PVD.  Organic PVD. This is the more common type. It is caused by damage to the structure of blood vessels.  Functional PVD. This is caused by conditions that make blood vessels contract and tighten (spasm).  Without treatment, PVD tends to get worse over time. PVD can also lead to acute ischemic limb. This is when an arm or limb suddenly has trouble getting enough blood. This is a medical emergency. Follow these instructions at home:  Take medicines only as told by your doctor.  Do not use any tobacco products, including cigarettes, chewing tobacco, or electronic cigarettes. If you need help quitting, ask your doctor.  Lose weight if you are overweight, and maintain a healthy weight as told by your doctor.  Eat a diet that is low in fat and cholesterol. If you need help, ask your doctor.  Exercise regularly. Ask your doctor for some good activities for you.  Take good care of your feet. ? Wear comfortable shoes that fit  well. ? Check your feet often for any cuts or sores. Contact a doctor if:  You have cramps in your legs while walking.  You have leg pain when you are at rest.  You have coldness in a leg or foot.  Your skin changes.  You are unable to get or have an erection (erectile dysfunction).  You have cuts or sores on your feet that are not healing. Get help right away if:  Your arm or leg turns cold and blue.  Your arms or legs become red, warm, swollen, painful, or numb.  You have chest pain or trouble breathing.  You suddenly have weakness in your face, arm, or leg.  You become very confused or you cannot speak.  You suddenly have a very bad headache.  You suddenly cannot see. This information is not intended to replace advice given to you by your health care provider. Make sure you discuss any questions you have with your health care provider. Document Released: 12/05/2009 Document Revised: 02/16/2016 Document Reviewed: 02/18/2014 Elsevier Interactive Patient Education  2017 [employer] Inc.  

## 2018-02-03 NOTE — Patient Instructions (Addendum)
Medication Instructions:  Your physician recommends that you continue on your current medications as directed. Please refer to the Current Medication list given to you today.  Labwork: None ordered.  Testing/Procedures: You will be admitted for dofetilide (Tikosyn) initiation.  Follow-Up:  Will be set up at discharge from hospital.  Any Other Special Instructions Will Be Listed Below (If Applicable).  You are being referred to Dr. Carlena Sax for a sleep evaluation.  If you need a refill on your cardiac medications before your next appointment, please call your pharmacy.   Dofetilide capsules What is this medicine? DOFETILIDE (doe FET il ide) is an antiarrhythmic drug. It helps make your heart beat regularly. This medicine also helps to slow rapid heartbeats. This medicine may be used for other purposes; ask your health care provider or pharmacist if you have questions. COMMON BRAND NAME(S): Tikosyn What should I tell my health care provider before I take this medicine? They need to know if you have any of these conditions: -heart disease -history of low levels of potassium or magnesium -kidney disease -liver disease -an unusual or allergic reaction to dofetilide, other medicines, foods, dyes, or preservatives -pregnant or trying to get pregnant -breast-feeding How should I use this medicine? Take this medicine by mouth with a glass of water. Follow the directions on the prescription label. You can take this medicine with or without food. Do not drink grapefruit juice with this medicine. Take your doses at regular intervals. Do not take your medicine more often than directed. Do not stop taking this medicine suddenly. This may cause serious, heart-related side effects. Your doctor will tell you how much medicine to take. If your doctor wants you to stop the medicine, the dose will be slowly lowered over time to avoid any side effects. Talk to your pediatrician regarding the use of  this medicine in children. Special care may be needed. Overdosage: If you think you have taken too much of this medicine contact a poison control center or emergency room at once. NOTE: This medicine is only for you. Do not share this medicine with others. What if I miss a dose? If you miss a dose, take it as soon as you can. If it is almost time for your next dose, take only that dose. Do not take double or extra doses. What may interact with this medicine? Do not take this medicine with any of the following medications: -cimetidine -dolutegravir -hydrochlorothiazide alone or in combination with other medicines -isavuconazonium -ketoconazole -megestrol -other medicines that prolong the QT interval (cause an abnormal heart rhythm) -prochlorperazine -trimethoprim alone or in combination with sulfamethoxazole -verapamil This medicine may also interact with the following medications: -amiloride -certain antidepressants like fluvoxamine or paroxetine -certain antiviral medicines for HIV or AIDS like atazanavir or darunavir -certain medicines for fungal infections like clotrimazole or miconazole -digoxin -diltiazem -dronabinol, THC -grapefruit juice -metformin -nefazodone -triamterene -zafirlukast This list may not describe all possible interactions. Give your health care provider a list of all the medicines, herbs, non-prescription drugs, or dietary supplements you use. Also tell them if you smoke, drink alcohol, or use illegal drugs. Some items may interact with your medicine. What should I watch for while using this medicine? Visit your doctor or health care professional for regular checks on your progress. Wear a medical ID bracelet or chain, and carry a card that describes your disease and details of your medicine and dosage times. Check your heart rate and blood pressure regularly while you are taking this  medicine. Ask your doctor or health care professional what your heart rate  and blood pressure should be, and when you should contact him or her. Your doctor or health care professional also may schedule regular tests to check your progress. You will be started on this medicine in a specialized facility for at least three days. You will be monitored to find the right dose of medicine for you. It is very important that you take your medicine exactly as prescribed when you leave the hospital. The correct dosing of this medicine is very important to treat your condition and prevent possible serious side effects. What side effects may I notice from receiving this medicine? Side effects that you should report to your doctor or health care professional as soon as possible: -allergic reactions like skin rash, itching or hives, swelling of the face, lips, or tongue -breathing problems -dizziness -fast or rapid beating of the heart -feeling faint or lightheaded -swelling of the ankles -unusually weak or tired -vomiting Side effects that usually do not require medical attention (report to your doctor or health care professional if they continue or are bothersome): -cough -diarrhea -difficulty sleeping -headache -nausea -stomach pain This list may not describe all possible side effects. Call your doctor for medical advice about side effects. You may report side effects to FDA at 1-800-FDA-1088. Where should I keep my medicine? Keep out of the reach of children. Store at room temperature between 15 and 30 degrees C (59 and 86 degrees F). Protect the medicine from moisture or humidity. Keep container tightly closed. Throw away any unused medicine after the expiration date. NOTE: This sheet is a summary. It may not cover all possible information. If you have questions about this medicine, talk to your doctor, pharmacist, or health care provider.  2018 Elsevier/Gold Standard (2016-07-23 55:20:80)

## 2018-02-06 ENCOUNTER — Other Ambulatory Visit (HOSPITAL_COMMUNITY): Payer: Self-pay | Admitting: *Deleted

## 2018-02-06 DIAGNOSIS — I4819 Other persistent atrial fibrillation: Secondary | ICD-10-CM

## 2018-02-07 ENCOUNTER — Other Ambulatory Visit: Payer: BLUE CROSS/BLUE SHIELD

## 2018-02-07 DIAGNOSIS — I4819 Other persistent atrial fibrillation: Secondary | ICD-10-CM

## 2018-02-08 LAB — BASIC METABOLIC PANEL
BUN/Creatinine Ratio: 14 (ref 10–24)
BUN: 13 mg/dL (ref 8–27)
CO2: 21 mmol/L (ref 20–29)
CREATININE: 0.91 mg/dL (ref 0.76–1.27)
Calcium: 9.3 mg/dL (ref 8.6–10.2)
Chloride: 101 mmol/L (ref 96–106)
GFR calc Af Amer: 103 mL/min/{1.73_m2} (ref >59)
GFR, EST NON AFRICAN AMERICAN: 89 mL/min/{1.73_m2} (ref >59)
GLUCOSE: 181 mg/dL — AB (ref 65–99)
Potassium: 3.5 mmol/L (ref 3.5–5.2)
Sodium: 141 mmol/L (ref 134–144)

## 2018-02-08 LAB — MAGNESIUM: MAGNESIUM: 1.7 mg/dL (ref 1.6–2.3)

## 2018-02-10 ENCOUNTER — Other Ambulatory Visit (HOSPITAL_COMMUNITY): Payer: Self-pay | Admitting: *Deleted

## 2018-02-10 MED ORDER — MAGNESIUM 200 MG PO TABS: 200.0000 mg | ORAL_TABLET | Freq: Every day | ORAL | Status: DC

## 2018-02-10 MED ORDER — POTASSIUM CHLORIDE CRYS ER 20 MEQ PO TBCR: 20.0000 meq | EXTENDED_RELEASE_TABLET | Freq: Every day | ORAL | 3 refills | Status: DC

## 2018-02-20 ENCOUNTER — Ambulatory Visit: Payer: BLUE CROSS/BLUE SHIELD | Admitting: Cardiovascular Disease

## 2018-02-20 ENCOUNTER — Encounter

## 2018-02-25 ENCOUNTER — Other Ambulatory Visit (HOSPITAL_COMMUNITY): Payer: Self-pay | Admitting: *Deleted

## 2018-02-25 ENCOUNTER — Ambulatory Visit (HOSPITAL_COMMUNITY)
Admission: RE | Admit: 2018-02-25 | Discharge: 2018-02-25 | Disposition: A | Discharge status: Home / Self Care | Payer: BLUE CROSS/BLUE SHIELD | Source: Ambulatory Visit | Attending: Nurse Practitioner | Admitting: Nurse Practitioner

## 2018-02-25 ENCOUNTER — Encounter (HOSPITAL_COMMUNITY): Payer: Self-pay | Admitting: Nurse Practitioner

## 2018-02-25 VITALS — BP 156/88 | HR 70 | Ht 70.0 in | Wt 190.2 lb

## 2018-02-25 DIAGNOSIS — I481 Persistent atrial fibrillation: Secondary | ICD-10-CM | POA: Insufficient documentation

## 2018-02-25 DIAGNOSIS — I4891 Unspecified atrial fibrillation: Secondary | ICD-10-CM

## 2018-02-25 LAB — BASIC METABOLIC PANEL
Anion gap: 9 (ref 5–15)
BUN: 19 mg/dL (ref 6–20)
CALCIUM: 9.3 mg/dL (ref 8.9–10.3)
CO2: 25 mmol/L (ref 22–32)
CREATININE: 1.04 mg/dL (ref 0.61–1.24)
Chloride: 106 mmol/L (ref 101–111)
GFR calc Af Amer: >60 mL/min (ref >60)
GLUCOSE: 248 mg/dL — AB (ref 65–99)
Potassium: 3.2 mmol/L — ABNORMAL LOW (ref 3.5–5.1)
SODIUM: 140 mmol/L (ref 135–145)

## 2018-02-25 LAB — MAGNESIUM: Magnesium: 1.7 mg/dL (ref 1.7–2.4)

## 2018-02-25 MED ORDER — POTASSIUM CHLORIDE CRYS ER 20 MEQ PO TBCR
20.0000 meq | EXTENDED_RELEASE_TABLET | Freq: Two times a day (BID) | ORAL | 3 refills | Status: DC
Start: 2018-02-25 — End: 2018-04-25

## 2018-02-25 MED ORDER — MAGNESIUM 200 MG PO TABS: 200.0000 mg | ORAL_TABLET | Freq: Every day | ORAL | Status: DC

## 2018-02-25 MED ORDER — LOSARTAN POTASSIUM 100 MG PO TABS: 100.0000 mg | ORAL_TABLET | Freq: Every day | ORAL | 11 refills | Status: DC

## 2018-02-25 MED ORDER — LOSARTAN POTASSIUM 100 MG PO TABS
100.0000 mg | ORAL_TABLET | Freq: Every day | ORAL | 3 refills | Status: DC
Start: 2018-02-25 — End: 2019-06-24

## 2018-02-25 NOTE — Patient Instructions (Signed)
STOP hyzaar   Start losartan 100mg  once a day

## 2018-02-25 NOTE — Progress Notes (Signed)
Pt was in for Tikosyn admit, but found that he had started losartan with HCTZ late April  or early May that was not updated in our system. He cannot have HCTZ  for at least 3 days prior to admission.  Hospitalization was cancelled and rescheduled for next week.

## 2018-02-27 ENCOUNTER — Encounter (HOSPITAL_COMMUNITY): Admission: RE | Payer: Self-pay | Source: Ambulatory Visit

## 2018-02-27 ENCOUNTER — Ambulatory Visit (HOSPITAL_COMMUNITY): Admission: RE | Admit: 2018-02-27 | Payer: BLUE CROSS/BLUE SHIELD | Source: Ambulatory Visit | Admitting: Cardiology

## 2018-02-27 SURGERY — CARDIOVERSION
Anesthesia: General

## 2018-02-28 ENCOUNTER — Telehealth: Payer: Self-pay | Admitting: Internal Medicine

## 2018-02-28 NOTE — Telephone Encounter (Signed)
Spoke with patient who was concerned about his Eliquis medication.  He was wondering if the fact that he took his Eliquis 4 hours later than usual, would disqualify him from his [facility] clinic appt 6/10.  I told him that the minimal time frame would not be of concern, but be sure not to miss any doses.  He verbalized understanding and thanked me for the call.

## 2018-02-28 NOTE — Telephone Encounter (Signed)
New Message   Pt c/o medication issue:  1. Name of Medication: ELIQUIS 5 MG TABS tablet  2. How are you currently taking this medication (dosage and times per day)?   3. Are you having a reaction (difficulty breathing--STAT)?   4. What is your medication issue? Patient is calling because although he took his Eliquis he took it later than usual. He wants to know if that will disqualify him from participating in the afib clinic

## 2018-03-03 ENCOUNTER — Other Ambulatory Visit: Payer: Self-pay

## 2018-03-03 ENCOUNTER — Encounter (HOSPITAL_COMMUNITY): Payer: Self-pay | Admitting: Nurse Practitioner

## 2018-03-03 ENCOUNTER — Ambulatory Visit (HOSPITAL_COMMUNITY)
Admission: RE | Admit: 2018-03-03 | Discharge: 2018-03-03 | Disposition: A | Discharge status: Home / Self Care | Payer: BLUE CROSS/BLUE SHIELD | Source: Ambulatory Visit | Attending: Nurse Practitioner | Admitting: Nurse Practitioner

## 2018-03-03 ENCOUNTER — Inpatient Hospital Stay (HOSPITAL_COMMUNITY)
Admission: RE | Admit: 2018-03-03 | Discharge: 2018-03-06 | DRG: 310 | Disposition: A | Discharge status: Home / Self Care | Payer: BLUE CROSS/BLUE SHIELD | Source: Ambulatory Visit | Attending: Internal Medicine | Admitting: Internal Medicine

## 2018-03-03 VITALS — BP 152/84 | HR 76 | Ht 70.0 in | Wt 190.0 lb

## 2018-03-03 DIAGNOSIS — Z8261 Family history of arthritis: Secondary | ICD-10-CM

## 2018-03-03 DIAGNOSIS — I739 Peripheral vascular disease, unspecified: Secondary | ICD-10-CM | POA: Diagnosis present

## 2018-03-03 DIAGNOSIS — Z9089 Acquired absence of other organs: Secondary | ICD-10-CM

## 2018-03-03 DIAGNOSIS — Z5181 Encounter for therapeutic drug level monitoring: Secondary | ICD-10-CM

## 2018-03-03 DIAGNOSIS — I481 Persistent atrial fibrillation: Secondary | ICD-10-CM | POA: Diagnosis present

## 2018-03-03 DIAGNOSIS — Z8673 Personal history of transient ischemic attack (TIA), and cerebral infarction without residual deficits: Secondary | ICD-10-CM | POA: Diagnosis not present

## 2018-03-03 DIAGNOSIS — Z8043 Family history of malignant neoplasm of testis: Secondary | ICD-10-CM

## 2018-03-03 DIAGNOSIS — I679 Cerebrovascular disease, unspecified: Secondary | ICD-10-CM | POA: Diagnosis present

## 2018-03-03 DIAGNOSIS — I10 Essential (primary) hypertension: Secondary | ICD-10-CM

## 2018-03-03 DIAGNOSIS — Z8249 Family history of ischemic heart disease and other diseases of the circulatory system: Secondary | ICD-10-CM

## 2018-03-03 DIAGNOSIS — I4510 Unspecified right bundle-branch block: Secondary | ICD-10-CM | POA: Diagnosis present

## 2018-03-03 DIAGNOSIS — Z87891 Personal history of nicotine dependence: Secondary | ICD-10-CM | POA: Diagnosis not present

## 2018-03-03 DIAGNOSIS — Z9049 Acquired absence of other specified parts of digestive tract: Secondary | ICD-10-CM

## 2018-03-03 DIAGNOSIS — K589 Irritable bowel syndrome without diarrhea: Secondary | ICD-10-CM | POA: Diagnosis present

## 2018-03-03 DIAGNOSIS — I484 Atypical atrial flutter: Secondary | ICD-10-CM | POA: Diagnosis present

## 2018-03-03 DIAGNOSIS — Z79899 Other long term (current) drug therapy: Secondary | ICD-10-CM

## 2018-03-03 DIAGNOSIS — I4581 Long QT syndrome: Secondary | ICD-10-CM | POA: Diagnosis present

## 2018-03-03 DIAGNOSIS — G4733 Obstructive sleep apnea (adult) (pediatric): Secondary | ICD-10-CM | POA: Diagnosis present

## 2018-03-03 DIAGNOSIS — Z9861 Coronary angioplasty status: Secondary | ICD-10-CM | POA: Diagnosis not present

## 2018-03-03 DIAGNOSIS — I252 Old myocardial infarction: Secondary | ICD-10-CM

## 2018-03-03 DIAGNOSIS — Z808 Family history of malignant neoplasm of other organs or systems: Secondary | ICD-10-CM | POA: Diagnosis not present

## 2018-03-03 DIAGNOSIS — M4800 Spinal stenosis, site unspecified: Secondary | ICD-10-CM | POA: Diagnosis present

## 2018-03-03 DIAGNOSIS — E785 Hyperlipidemia, unspecified: Secondary | ICD-10-CM | POA: Diagnosis present

## 2018-03-03 DIAGNOSIS — I2510 Atherosclerotic heart disease of native coronary artery without angina pectoris: Secondary | ICD-10-CM | POA: Diagnosis present

## 2018-03-03 DIAGNOSIS — I4891 Unspecified atrial fibrillation: Secondary | ICD-10-CM | POA: Diagnosis not present

## 2018-03-03 DIAGNOSIS — I4819 Other persistent atrial fibrillation: Secondary | ICD-10-CM

## 2018-03-03 LAB — MAGNESIUM: Magnesium: 2.1 mg/dL (ref 1.7–2.4)

## 2018-03-03 LAB — BASIC METABOLIC PANEL
ANION GAP: 10 (ref 5–15)
BUN: 17 mg/dL (ref 6–20)
CALCIUM: 9.3 mg/dL (ref 8.9–10.3)
CO2: 24 mmol/L (ref 22–32)
Chloride: 107 mmol/L (ref 101–111)
Creatinine, Ser: 1.11 mg/dL (ref 0.61–1.24)
GFR calc Af Amer: >60 mL/min (ref >60)
GLUCOSE: 202 mg/dL — AB (ref 65–99)
Potassium: 3.7 mmol/L (ref 3.5–5.1)
Sodium: 141 mmol/L (ref 135–145)

## 2018-03-03 MED ORDER — SODIUM CHLORIDE 0.9% FLUSH: 3.0000 mL | INTRAVENOUS | Status: DC | PRN

## 2018-03-03 MED ORDER — CARBOXYMETHYLCELLUL-GLYCERIN 0.5-0.9 % OP SOLN: 1.0000 [drp] | Freq: Every morning | OPHTHALMIC | Status: DC

## 2018-03-03 MED ORDER — LOSARTAN POTASSIUM 50 MG PO TABS
100.0000 mg | ORAL_TABLET | Freq: Every day | ORAL | Status: DC
  Administered 2018-03-04 – 2018-03-06 (×3): 100 mg via ORAL
  Filled 2018-03-03 (×3): qty 2

## 2018-03-03 MED ORDER — POTASSIUM CHLORIDE CRYS ER 20 MEQ PO TBCR
20.0000 meq | EXTENDED_RELEASE_TABLET | Freq: Two times a day (BID) | ORAL | Status: DC
  Administered 2018-03-04 – 2018-03-06 (×5): 20 meq via ORAL
  Filled 2018-03-03 (×5): qty 1

## 2018-03-03 MED ORDER — MAGNESIUM OXIDE 400 (241.3 MG) MG PO TABS
400.0000 mg | ORAL_TABLET | Freq: Every day | ORAL | Status: DC
  Administered 2018-03-04 – 2018-03-06 (×3): 400 mg via ORAL
  Filled 2018-03-03 (×4): qty 1

## 2018-03-03 MED ORDER — MAGNESIUM OXIDE 400 (241.3 MG) MG PO TABS: 400.0000 mg | ORAL_TABLET | Freq: Every day | ORAL | Status: DC

## 2018-03-03 MED ORDER — SODIUM CHLORIDE 0.9 % IV SOLN: 250.0000 mL | INTRAVENOUS | Status: DC | PRN

## 2018-03-03 MED ORDER — DOFETILIDE 500 MCG PO CAPS: 500.0000 ug | ORAL_CAPSULE | Freq: Two times a day (BID) | ORAL | Status: DC

## 2018-03-03 MED ORDER — POTASSIUM CHLORIDE CRYS ER 20 MEQ PO TBCR
40.0000 meq | EXTENDED_RELEASE_TABLET | Freq: Once | ORAL | Status: AC
  Administered 2018-03-03: 40 meq via ORAL
  Filled 2018-03-03: qty 2

## 2018-03-03 MED ORDER — APIXABAN 5 MG PO TABS
5.0000 mg | ORAL_TABLET | Freq: Two times a day (BID) | ORAL | Status: DC
  Administered 2018-03-03 – 2018-03-06 (×6): 5 mg via ORAL
  Filled 2018-03-03 (×6): qty 1

## 2018-03-03 MED ORDER — OFF THE BEAT BOOK
Freq: Once | Status: AC
  Administered 2018-03-03: 17:00:00
  Filled 2018-03-03: qty 1

## 2018-03-03 MED ORDER — POTASSIUM CHLORIDE CRYS ER 20 MEQ PO TBCR: 20.0000 meq | EXTENDED_RELEASE_TABLET | Freq: Two times a day (BID) | ORAL | Status: DC

## 2018-03-03 MED ORDER — HYDROCODONE-ACETAMINOPHEN 5-325 MG PO TABS: 1.0000 | ORAL_TABLET | Freq: Four times a day (QID) | ORAL | Status: DC | PRN

## 2018-03-03 MED ORDER — FENOFIBRATE 160 MG PO TABS
160.0000 mg | ORAL_TABLET | Freq: Every day | ORAL | Status: DC
  Administered 2018-03-04 – 2018-03-06 (×3): 160 mg via ORAL
  Filled 2018-03-03 (×3): qty 1

## 2018-03-03 MED ORDER — ATORVASTATIN CALCIUM 40 MG PO TABS
40.0000 mg | ORAL_TABLET | Freq: Every day | ORAL | Status: DC
  Administered 2018-03-04 – 2018-03-06 (×3): 40 mg via ORAL
  Filled 2018-03-03 (×3): qty 1

## 2018-03-03 MED ORDER — DOFETILIDE 500 MCG PO CAPS
500.0000 ug | ORAL_CAPSULE | Freq: Two times a day (BID) | ORAL | Status: DC
  Administered 2018-03-03: 500 ug via ORAL
  Filled 2018-03-03: qty 1

## 2018-03-03 MED ORDER — AMLODIPINE BESYLATE 10 MG PO TABS
10.0000 mg | ORAL_TABLET | Freq: Every day | ORAL | Status: DC
  Administered 2018-03-03 – 2018-03-05 (×3): 10 mg via ORAL
  Filled 2018-03-03 (×3): qty 1

## 2018-03-03 MED ORDER — SODIUM CHLORIDE 0.9% FLUSH
3.0000 mL | Freq: Two times a day (BID) | INTRAVENOUS | Status: DC
  Administered 2018-03-03 – 2018-03-04 (×4): 3 mL via INTRAVENOUS

## 2018-03-03 NOTE — Progress Notes (Signed)
Patient admitted to 6E16 from [facility] via w/c.  Bed in low position, wheels locked.  Patient denies chest pain/shortness of breath.  Portable telemetry monitor applied.  Patient oriented to environment, including call bell, TV, meal times, and hourly rounding.  PA notified of patient's arrival.

## 2018-03-03 NOTE — Progress Notes (Signed)
Electrophysiology H&P   Date:  03/03/2018   ID:  Scott [name], DOB 03-15-1951, MRN [id number]  PCP:  Shirline Frees, MD  Cardiologist:  Dr Acie Fredrickson Primary Electrophysiologist: Thompson Grayer, MD    CC: afib   History of Present Illness: Scott [name] is a 67 y.o. male who presents today for electrophysiology evaluation.   He presents today for consideration of tikosyn initiation.  He was initially diagnosed with atrial fibrillation 2016 after presenting with fatigue.  He has been persistently in afib.  He did undergo cardioversion 06/29/2016 but quickly returned to afib.  He has not tried AAD therapy.  He is on eliquis for stroke prevention.  He has marked fatigue and states "I just want my life back".  He snores and does not feel well rested upon waking.  He wakes multiple times during the night He recently presented for initiation of tikosyn but was taking HCTZ.  His admission was therefore cancelled.   He presents today for further evaluation.  Today, he denies symptoms of palpitations, chest pain, shortness of breath, orthopnea, PND, lower extremity edema, claudication, dizziness, presyncope, syncope, bleeding, or neurologic sequela. The patient is tolerating medications without difficulties and is otherwise without complaint today.    Past Medical History:  Diagnosis Date  . CAD (coronary artery disease)   . Cerebrovascular disease   . HLD (hyperlipidemia)   . HTN (hypertension)   . IBS (irritable bowel syndrome)   . MI, old   . Persistent atrial fibrillation ([place])   . PVD (peripheral vascular disease) ([place])   . Stroke (cerebrum) ([place])   . Tobacco abuse    Past Surgical History:  Procedure Laterality Date  . ABDOMINAL AORTOGRAM W/LOWER EXTREMITY N/A 01/25/2017   Procedure: Abdominal Aortogram w/Lower Extremity;  Surgeon: Elam Dutch, MD;  Location: [place];  Service: Cardiovascular;  Laterality: N/A;  . ANTERIOR CRUCIATE LIGAMENT REPAIR    . CARDIAC  CATHETERIZATION     [facility]  . CARDIAC CATHETERIZATION N/A 07/27/2016   Procedure: Left Heart Cath and Coronary Angiography;  Surgeon: Sherren Mocha, MD;  Location: [facility];  Service: Cardiovascular;  Laterality: N/A;  . CARDIOVERSION N/A 06/29/2016   Procedure: CARDIOVERSION;  Surgeon: Fay Records, MD;  Location: [facility];  Service: Cardiovascular;  Laterality: N/A;  . CHOLECYSTECTOMY    . ENDARTERECTOMY Right 12/06/2017   Procedure: ENDARTERECTOMY CAROTID RIGHT;  Surgeon: Serafina Mitchell, MD;  Location: [facility];  Service: Vascular;  Laterality: Right;  . [place], 2000   x 4 times.  Marland Kitchen PATCH ANGIOPLASTY Right 12/06/2017   Procedure: PATCH ANGIOPLASTY RIGHT CAROTID ARTERY;  Surgeon: Serafina Mitchell, MD;  Location: [facility];  Service: Vascular;  Laterality: Right;  . PERIPHERAL VASCULAR INTERVENTION Right 01/25/2017   Procedure: Peripheral Vascular Intervention;  Surgeon: Elam Dutch, MD;  Location: [facility];  Service: Cardiovascular;  Laterality: Right;  . TONSILLECTOMY       Current Outpatient Medications  Medication Sig Dispense Refill  . amLODipine (NORVASC) 10 MG tablet Take 10 mg by mouth at bedtime.     Marland Kitchen atorvastatin (LIPITOR) 40 MG tablet Take 1 tablet (40 mg total) by mouth daily. 30 tablet 0  . Carboxymethylcellul-Glycerin (REFRESH OPTIVE OP) Place 1 drop into both eyes every morning.     Marland Kitchen ELIQUIS 5 MG TABS tablet TAKE 1 TABLET(5 MG) BY MOUTH TWICE DAILY 60 tablet 7  . fenofibrate (TRICOR) 145 MG tablet Take  145 mg by mouth daily.    Marland Kitchen HYDROcodone-acetaminophen (NORCO/VICODIN) 5-325 MG tablet Take 1-2 tablets by mouth every 6 (six) hours as needed (for pain). (Patient taking differently: Take 2 tablets by mouth every 6 (six) hours as needed for severe pain. ) 8 tablet 0  . losartan (COZAAR) 100 MG tablet Take 1 tablet (100 mg total) by mouth daily. 30 tablet 3  . Magnesium 500 MG CAPS Take by mouth.    . potassium chloride SA  (K-DUR,KLOR-CON) 20 MEQ tablet Take 1 tablet (20 mEq total) by mouth 2 (two) times daily. (Patient taking differently: Take 20 mEq by mouth once. ) 90 tablet 3  . nicotine (NICODERM CQ - DOSED IN MG/24 HOURS) 21 mg/24hr patch Place 1 patch (21 mg total) onto the skin daily. (Patient not taking: Reported on 03/03/2018) 28 patch 0   No current facility-administered medications for this encounter.     Allergies:   Patient has no known allergies.   Social History:  The patient  reports that he quit smoking about 2 months ago. His smoking use included cigarettes. He started smoking about 46 years ago. He quit after 43.00 years of use. He has never used smokeless tobacco. He reports that he drinks alcohol. He reports that he does not use drugs.   Family History:  [name]  family history includes Arthritis in his mother and sister; Heart disease in his father; Hypertension in his mother; Melanoma in his father; Testicular cancer in his father.    ROS:  Please see the history of present illness.   All other systems are personally reviewed and negative.    PHYSICAL EXAM: VS:  BP (!) 152/84 (BP Location: Left Arm, Patient Position: Sitting, Cuff Size: Large)   Pulse 76   Ht 5\' 10"  (1.778 m)   Wt 190 lb (86.2 kg)   BMI 27.26 kg/m  , BMI Body mass index is 27.26 kg/m. GEN: Well nourished, well developed, in no acute distress  HEENT: normal  Neck: no JVD, carotid bruits, or masses Cardiac: iRRR; no murmurs, rubs, or gallops,no edema  Respiratory:  clear to auscultation bilaterally, normal work of breathing GI: soft, nontender, nondistended, + BS MS: no deformity or atrophy  Skin: warm and dry  Neuro:  Strength and sensation are intact Psych: euthymic mood, full affect  EKG:  EKG is ordered today. The ekg ordered today is personally reviewed and shows afib, V rate 76 bpm, incomplete RBBB, LAHB, QTc 440 msec, nonspecific ST/T changes   Recent Labs: 10/23/2017: ALT 22; TSH  1.480 12/07/2017: Hemoglobin 12.7; Platelets 245 02/25/2018: BUN 19; Creatinine, Ser 1.04; Magnesium 1.7; Potassium 3.2; Sodium 140  personally reviewed   Lipid Panel     Component Value Date/Time   CHOL 125 12/05/2017 0858   CHOL 146 10/23/2017 0933   TRIG 51 12/05/2017 0858   HDL 40 (L) 12/05/2017 0858   HDL 37 (L) 10/23/2017 0933   CHOLHDL 3.1 12/05/2017 0858   VLDL 10 12/05/2017 0858   LDLCALC 75 12/05/2017 0858   LDLCALC 88 10/23/2017 0933   personally reviewed   Wt Readings from Last 3 Encounters:  03/03/18 190 lb (86.2 kg)  02/25/18 190 lb 3.2 oz (86.3 kg)  02/03/18 188 lb (85.3 kg)      Other studies personally reviewed: Additional studies/ records that were reviewed today include: AF clinic notes, my prior note  Review of the above records today demonstrates: as above   ASSESSMENT AND PLAN:  1.  Persistent  afib The patient has symptomatic, recurrent longstanding persistent atrial fibrillation. He has not previously tried AAD therapy Chads2vasc score is 4.  he is anticoagulated with eliquis . Therapeutic strategies for afib including rate and rhythm control were discussed in detail with the patient today. Risk, benefits, and alternatives to tikosyn were discussed at length. He would like to proceed.  We will therefore plan elective admission for tikosyn at this time  2. HTN Stable No change required today   Follow-up:  Return in 1 week post initiation of tikosyn to AF clinic  Current medicines are reviewed at length with the patient today.   The patient does not have concerns regarding his medicines.  The following changes were made today:  none  Labs/ tests ordered today include:  Orders Placed This Encounter  Procedures  . Basic metabolic panel  . Magnesium  . EKG 12-Lead     Signed, Thompson Grayer, MD  03/03/2018 10:31 AM     [facility] [address] [place] [address] [phone] (office) [phone]  (fax)    1.  Longstanding persistent afib The patient has symptomatic afib.  He has had unsuccessful cardioversion previously.  He has not tried AAD therapy.  He is rate controlled. On eliquis for chads2vasc score of at least 4. Therapeutic strategies for afib including medicine (multaq, tikosyn, and amiodarone) and ablation were discussed in detail with the patient today. Risk, benefits, and alternatives to each were discussed.  As per guidelines for persistent afib, we should try AAD therapy prior to ablation. He would prefer tikosyn.  I think that this is his best option.  Compliance with eliquis is advised.  We will admit for tikosyn after he has had no missed doses for at least 3 weeks. Will order an echo when he is in the hospital for [place].    2. Snoring He snores and does not feel well rested upon waking.  He wakes multiple times during the night.  I will refer to Dr Maxwell Caul for sleep evaluation.  3. HTN Stable No change required today  4. CVD/ prior stroke Importance of anticoagulation discussed today

## 2018-03-03 NOTE — Discharge Instructions (Addendum)
You have an appointment set up with the Atrial Fibrillation Clinic.  Multiple studies have shown that being followed by a dedicated atrial fibrillation clinic in addition to the standard care you receive from your other physicians improves health. We believe that enrollment in the atrial fibrillation clinic will allow us to better care for you.  ° °The phone number to the Atrial Fibrillation Clinic is [phone]. The clinic is staffed Monday through Friday from 8:30am to 5pm. ° °Parking Directions: The clinic is located in the Heart and Vascular Building connected to [facility]. °1)From [address] turn on to [address] and go to the 3rd entrance  (Heart and Vascular entrance) on the right. °2)Look to the right for Heart &Vascular Parking Garage. °3)A code for the entrance is required please call the clinic to receive this.   °4)Take the elevators to the 1st floor. Registration is in the room with the glass walls at the end of the hallway. ° °If you have any trouble parking or locating the clinic, please don’t hesitate to call [phone]. °Information on my medicine - ELIQUIS® (apixaban) ° °Why was Eliquis® prescribed for you? °Eliquis® was prescribed for you to reduce the risk of a blood clot forming that can cause a stroke if you have a medical condition called atrial fibrillation (a type of irregular heartbeat). ° °What do You need to know about Eliquis® ? °Take your Eliquis® TWICE DAILY - one tablet in the morning and one tablet in the evening with or without food. If you have difficulty swallowing the tablet whole please discuss with your pharmacist how to take the medication safely. ° °Take Eliquis® exactly as prescribed by your doctor and DO NOT stop taking Eliquis® without talking to the doctor who prescribed the medication.  Stopping may increase your risk of developing a stroke.  Refill your prescription before you run out. ° °After discharge, you should have regular check-up  appointments with your healthcare provider that is prescribing your Eliquis®.  In the future your dose may need to be changed if your kidney function or weight changes by a significant amount or as you get older. ° °What do you do if you miss a dose? °If you miss a dose, take it as soon as you remember on the same day and resume taking twice daily.  Do not take more than one dose of ELIQUIS at the same time to make up a missed dose. ° °Important Safety Information °A possible side effect of Eliquis® is bleeding. You should call your healthcare provider right away if you experience any of the following: °? Bleeding from an injury or your nose that does not stop. °? Unusual colored urine (red or dark brown) or unusual colored stools (red or black). °? Unusual bruising for unknown reasons. °? A serious fall or if you hit your head (even if there is no bleeding). ° °Some medicines may interact with Eliquis® and might increase your risk of bleeding or clotting while on Eliquis®. To help avoid this, consult your healthcare provider or pharmacist prior to using any new prescription or non-prescription medications, including herbals, vitamins, non-steroidal anti-inflammatory drugs (NSAIDs) and supplements. ° °This website has more information on Eliquis® (apixaban): http://www.eliquis.com/eliquis/home ° °

## 2018-03-03 NOTE — Care Management Note (Addendum)
Case Management Note  Patient Details  Name: Scott [name] MRN: [id number] Date of Birth: March 04, 1951  Subjective/Objective: Pt presented for Tikosyn Load. Benefits Check in process. CM will make patient aware of cost once completed. Pt uses [facility].  Pt will need a Rx for 7 day supply no refills and the original Rx with refills. CM will assist with getting the Rx for 7 day supply via [facility].   Action/Plan: CM will continue to monitor for additional needs.   Expected Discharge Date:  03/06/18               Expected Discharge Plan:  Home/Self Care  In-House Referral:  NA  Discharge planning Services  CM Consult, Medication Assistance  Post Acute Care Choice:  NA Choice offered to:  NA  DME Arranged:  N/A DME Agency:  NA  HH Arranged:  NA HH Agency:  NA  Status of Service:  Completed, signed off  If discussed at [place] of Stay Meetings, dates discussed:    Additional Comments:  Jamelle Haring  JESSICA @ [facility] # [phone]     1. TIKOSYN 500MCG [place] # [phone] FOR EXCEPTION    2, DOFETILIDE 500 MCG BID  COVER - REJECTION  PRIOR APPROVAL-  YES # [id number]     [facility] : [facility]  Bethena Roys, RN 03/03/2018, 2:10 PM

## 2018-03-03 NOTE — H&P (Signed)
EP H&P  PCP:  Shirline Frees, MD       Cardiologist:  Dr Acie Fredrickson Primary Electrophysiologist: Thompson Grayer, MD       CC: afib   History of Present Illness: Scott [name] is a 67 y.o. male who presents today for electrophysiology evaluation.   He presents today for consideration of tikosyn initiation.  He was initially diagnosed with atrial fibrillation 2016 after presenting with fatigue. He has been persistently in afib. He did undergo cardioversion 06/29/2016 but quickly returned to afib. He has not tried AAD therapy. He is on eliquis for stroke prevention. He has marked fatigue and states "I just want my life back". He snores and does not feel well rested upon waking. He wakes multiple times during the night He recently presented for initiation of tikosyn but was taking HCTZ.  His admission was therefore cancelled.   He presents today for further evaluation.  Today, he denies symptoms of palpitations, chest pain, shortness of breath, orthopnea, PND, lower extremity edema, claudication, dizziness, presyncope, syncope, bleeding, or neurologic sequela. The patient is tolerating medications without difficulties and is otherwise without complaint today.        Past Medical History:  Diagnosis Date  . CAD (coronary artery disease)   . Cerebrovascular disease   . HLD (hyperlipidemia)   . HTN (hypertension)   . IBS (irritable bowel syndrome)   . MI, old   . Persistent atrial fibrillation ([place])   . PVD (peripheral vascular disease) ([place])   . Stroke (cerebrum) ([place])   . Tobacco abuse         Past Surgical History:  Procedure Laterality Date  . ABDOMINAL AORTOGRAM W/LOWER EXTREMITY N/A 01/25/2017   Procedure: Abdominal Aortogram w/Lower Extremity;  Surgeon: Elam Dutch, MD;  Location: [facility];  Service: Cardiovascular;  Laterality: N/A;  . ANTERIOR CRUCIATE LIGAMENT REPAIR    . CARDIAC CATHETERIZATION     [facility]  . CARDIAC CATHETERIZATION N/A  07/27/2016   Procedure: Left Heart Cath and Coronary Angiography;  Surgeon: Sherren Mocha, MD;  Location: [facility];  Service: Cardiovascular;  Laterality: N/A;  . CARDIOVERSION N/A 06/29/2016   Procedure: CARDIOVERSION;  Surgeon: Fay Records, MD;  [facility];  Service: Cardiovascular;  Laterality: N/A;  . CHOLECYSTECTOMY    . ENDARTERECTOMY Right 12/06/2017   Procedure: ENDARTERECTOMY CAROTID RIGHT;  Surgeon: Serafina Mitchell, MD;  Location: [facility];  Service: Vascular;  Laterality: Right;  . [place], 2000   x 4 times.  Marland Kitchen PATCH ANGIOPLASTY Right 12/06/2017   Procedure: PATCH ANGIOPLASTY RIGHT CAROTID ARTERY;  Surgeon: Serafina Mitchell, MD;  Location: [facility];  Service: Vascular;  Laterality: Right;  . PERIPHERAL VASCULAR INTERVENTION Right 01/25/2017   Procedure: Peripheral Vascular Intervention;  Surgeon: Elam Dutch, MD;  Location: [facility];  Service: Cardiovascular;  Laterality: Right;  . TONSILLECTOMY             Current Outpatient Medications  Medication Sig Dispense Refill  . amLODipine (NORVASC) 10 MG tablet Take 10 mg by mouth at bedtime.     Marland Kitchen atorvastatin (LIPITOR) 40 MG tablet Take 1 tablet (40 mg total) by mouth daily. 30 tablet 0  . Carboxymethylcellul-Glycerin (REFRESH OPTIVE OP) Place 1 drop into both eyes every morning.     Marland Kitchen ELIQUIS 5 MG TABS tablet TAKE 1 TABLET(5 MG) BY MOUTH TWICE DAILY 60 tablet 7  . fenofibrate (TRICOR) 145 MG tablet Take 145  mg by mouth daily.    Marland Kitchen HYDROcodone-acetaminophen (NORCO/VICODIN) 5-325 MG tablet Take 1-2 tablets by mouth every 6 (six) hours as needed (for pain). (Patient taking differently: Take 2 tablets by mouth every 6 (six) hours as needed for severe pain. ) 8 tablet 0  . losartan (COZAAR) 100 MG tablet Take 1 tablet (100 mg total) by mouth daily. 30 tablet 3  . Magnesium 500 MG CAPS Take by mouth.    . potassium chloride SA (K-DUR,KLOR-CON) 20 MEQ tablet Take 1  tablet (20 mEq total) by mouth 2 (two) times daily. (Patient taking differently: Take 20 mEq by mouth once. ) 90 tablet 3  . nicotine (NICODERM CQ - DOSED IN MG/24 HOURS) 21 mg/24hr patch Place 1 patch (21 mg total) onto the skin daily. (Patient not taking: Reported on 03/03/2018) 28 patch 0   No current facility-administered medications for this encounter.     Allergies:   Patient has no known allergies.   Social History:  The patient  reports that he quit smoking about 2 months ago. His smoking use included cigarettes. He started smoking about 46 years ago. He quit after 43.00 years of use. He has never used smokeless tobacco. He reports that he drinks alcohol. He reports that he does not use drugs.   Family History:  [name]  family history includes Arthritis in his mother and sister; Heart disease in his father; Hypertension in his mother; Melanoma in his father; Testicular cancer in his father.    ROS:  Please see the history of present illness.   All other systems are personally reviewed and negative.    PHYSICAL EXAM: VS:  BP (!) 152/84 (BP Location: Left Arm, Patient Position: Sitting, Cuff Size: Large)   Pulse 76   Ht 5\' 10"  (1.778 m)   Wt 190 lb (86.2 kg)   BMI 27.26 kg/m  , BMI Body mass index is 27.26 kg/m. GEN: Well nourished, well developed, in no acute distress  HEENT: normal  Neck: no JVD, carotid bruits, or masses Cardiac: iRRR; no murmurs, rubs, or gallops,no edema  Respiratory:  clear to auscultation bilaterally, normal work of breathing GI: soft, nontender, nondistended, + BS MS: no deformity or atrophy  Skin: warm and dry  Neuro:  Strength and sensation are intact Psych: euthymic mood, full affect  EKG:  EKG is ordered today. The ekg ordered today is personally reviewed and shows afib, V rate 76 bpm, incomplete RBBB, LAHB, QTc 440 msec, nonspecific ST/T changes   Recent Labs: 10/23/2017: ALT 22; TSH 1.480 12/07/2017: Hemoglobin 12.7;  Platelets 245 02/25/2018: BUN 19; Creatinine, Ser 1.04; Magnesium 1.7; Potassium 3.2; Sodium 140  personally reviewed   Lipid Panel  Labs(Brief)          Component Value Date/Time   CHOL 125 12/05/2017 0858   CHOL 146 10/23/2017 0933   TRIG 51 12/05/2017 0858   HDL 40 (L) 12/05/2017 0858   HDL 37 (L) 10/23/2017 0933   CHOLHDL 3.1 12/05/2017 0858   VLDL 10 12/05/2017 0858   LDLCALC 75 12/05/2017 0858   LDLCALC 88 10/23/2017 0933     personally reviewed      Wt Readings from Last 3 Encounters:  03/03/18 190 lb (86.2 kg)  02/25/18 190 lb 3.2 oz (86.3 kg)  02/03/18 188 lb (85.3 kg)      Other studies personally reviewed: Additional studies/ records that were reviewed today include: AF clinic notes, my prior note  Review of the above records today demonstrates:  as above   ASSESSMENT AND PLAN:  1.  Persistent afib The patient has symptomatic, recurrent longstanding persistent atrial fibrillation. He has not previously tried AAD therapy Chads2vasc score is 4.  he is anticoagulated with eliquis . Therapeutic strategies for afib including rate and rhythm control were discussed in detail with the patient today. Risk, benefits, and alternatives to tikosyn were discussed at length. He would like to proceed.  We will therefore plan elective admission for tikosyn at this time  2. HTN Stable No change required today  Thompson Grayer MD, [facility] 03/03/2018 1:41 PM

## 2018-03-03 NOTE — Progress Notes (Addendum)
EKG today was reviewed by Dr. Rayann Heman, as afib, V rate 76 bpm, incomplete RBBB, LAHB, QTc 440 msec, nonspecific ST/T changes  K+ today is 3.7, I have ordered 62meq x1 dose, discussed with Dr. Rayann Heman, no need to recheck, after replacement, OK to give Tikosyn dose tonight.  Tommye Standard, PA-C  Thompson Grayer MD, [facility]

## 2018-03-03 NOTE — Progress Notes (Signed)
Pharmacy Review for Dofetilide (Tikosyn) Initiation  Admit Complaint: 67 y.o. male admitted 03/03/2018 with atrial fibrillation to be initiated on dofetilide.   Assessment:  Patient Exclusion Criteria: If any screening criteria checked as "Yes", then  patient  should NOT receive dofetilide until criteria item is corrected. If "Yes" please indicate correction plan.  YES  NO Patient  Exclusion Criteria Correction Plan  []  [x]  Baseline QTc interval is greater than or equal to 440 msec. IF above YES box checked dofetilide contraindicated unless patient has ICD; then may proceed if QTc 500-550 msec or with known ventricular conduction abnormalities may proceed with QTc 550-600 msec. QTc =  440    []  [x]  Magnesium level is less than 1.8 mEq/l : Last magnesium:  Lab Results  Component Value Date   MG 2.1 03/03/2018       MagOx 400 mg daily to continue.  [x]  []  Potassium level is less than 4 mEq/l : Last potassium:  Lab Results  Component Value Date   K 3.7 03/03/2018       KCl 40 mEq x 1 given and 20 mEq BID to continune.  []  [x]  Patient is known or suspected to have a digoxin level greater than 2 ng/ml: No results found for: DIGOXIN    []  [x]  Creatinine clearance less than 20 ml/min (calculated using Cockcroft-Gault, actual body weight and serum creatinine): Estimated Creatinine Clearance: 69.4 mL/min (by C-G formula based on SCr of 1.11 mg/dL).    []  [x]  Patient has received drugs known to prolong the QT intervals within the last 48 hours (phenothiazines, tricyclics or tetracyclic antidepressants, erythromycin, H-1 antihistamines, cisapride, fluoroquinolones, azithromycin). Drugs not listed above may have an, as yet, undetected potential to prolong the QT interval, updated information on QT prolonging agents is available at this website:QT prolonging agents   []  [x]  Patient received a dose of hydrochlorothiazide (Oretic) alone or in any combination including triamterene (Dyazide, Maxzide)  in the last 48 hours. HCTZ stopped last week. Patient reports last dose 6/4.  []  [x]  Patient received a medication known to increase dofetilide plasma concentrations prior to initial dofetilide dose:  . Trimethoprim (Primsol, Proloprim) in the last 36 hours . Verapamil (Calan, Verelan) in the last 36 hours or a sustained release dose in the last 72 hours . Megestrol (Megace) in the last 5 days  . Cimetidine (Tagamet) in the last 6 hours . Ketoconazole (Nizoral) in the last 24 hours . Itraconazole (Sporanox) in the last 48 hours  . Prochlorperazine (Compazine) in the last 36 hours    []  [x]  Patient is known to have a history of torsades de pointes; congenital or acquired long QT syndromes.   []  [x]  Patient has received a Class 1 antiarrhythmic with less than 2 half-lives since last dose. (Disopyramide, Quinidine, Procainamide, Lidocaine, Mexiletine, Flecainide, Propafenone)   []  []  Patient has received amiodarone therapy in the past 3 months or amiodarone level is greater than 0.3 ng/ml.    Patient has been appropriately anticoagulated with Eliquis.  Ordering provider was confirmed at [email or web address] if they are not listed on the [place] Prescribers list.  Goal of Therapy: Follow renal function, electrolytes, potential drug interactions, and dose adjustment. Provide education and 1 week supply at discharge.  Plan:  [x]   Physician selected initial dose within range recommended for patients level of renal function - will monitor for response.  []   Physician selected initial dose outside of range recommended for patients level of renal function - will discuss if  the dose should be altered at this time.   Select One Calculated CrCl  Dose q12h  [x]  > 60 ml/min 500 mcg  []  40-60 ml/min 250 mcg  []  20-40 ml/min 125 mcg   2. Follow up QTc after the first 5 doses, renal function, electrolytes (K & Mg) daily x 3     days, dose adjustment, success of initiation and facilitate 1 week  discharge supply as     clinically indicated.  3. Offered Tikosyn education video but patient declined.  He discussed with PA/MD and has done reading on his own. (Call [phone] and ask for Tikosyn Video # [phone]).   Arty Baumgartner, [place] Pager: [phone] or phone: [phone]  3:53 PM 03/03/2018

## 2018-03-04 LAB — CBC
HCT: 47.1 % (ref 39.0–52.0)
Hemoglobin: 15.5 g/dL (ref 13.0–17.0)
MCH: 28.5 pg (ref 26.0–34.0)
MCHC: 32.9 g/dL (ref 30.0–36.0)
MCV: 86.7 fL (ref 78.0–100.0)
PLATELETS: 286 10*3/uL (ref 150–400)
RBC: 5.43 MIL/uL (ref 4.22–5.81)
RDW: 12.9 % (ref 11.5–15.5)
WBC: 6.9 10*3/uL (ref 4.0–10.5)

## 2018-03-04 LAB — BASIC METABOLIC PANEL
Anion gap: 5 (ref 5–15)
BUN: 19 mg/dL (ref 6–20)
CALCIUM: 8.5 mg/dL — AB (ref 8.9–10.3)
CO2: 27 mmol/L (ref 22–32)
CREATININE: 1.11 mg/dL (ref 0.61–1.24)
Chloride: 109 mmol/L (ref 101–111)
GFR calc non Af Amer: >60 mL/min (ref >60)
GLUCOSE: 153 mg/dL — AB (ref 65–99)
Potassium: 4 mmol/L (ref 3.5–5.1)
Sodium: 141 mmol/L (ref 135–145)

## 2018-03-04 LAB — MAGNESIUM: MAGNESIUM: 2.1 mg/dL (ref 1.7–2.4)

## 2018-03-04 MED ORDER — SPIRONOLACTONE 12.5 MG HALF TABLET
12.5000 mg | ORAL_TABLET | Freq: Every day | ORAL | Status: DC
  Administered 2018-03-04 – 2018-03-06 (×3): 12.5 mg via ORAL
  Filled 2018-03-04 (×3): qty 1

## 2018-03-04 MED ORDER — SODIUM CHLORIDE 0.9% FLUSH
3.0000 mL | Freq: Two times a day (BID) | INTRAVENOUS | Status: DC
  Administered 2018-03-04 – 2018-03-06 (×3): 3 mL via INTRAVENOUS

## 2018-03-04 MED ORDER — SODIUM CHLORIDE 0.9 % IV SOLN
250.0000 mL | INTRAVENOUS | Status: DC
  Administered 2018-03-05: 500 mL via INTRAVENOUS

## 2018-03-04 MED ORDER — SODIUM CHLORIDE 0.9% FLUSH: 3.0000 mL | INTRAVENOUS | Status: DC | PRN

## 2018-03-04 MED ORDER — HYDROCORTISONE 1 % EX CREA: 1.0000 "application " | TOPICAL_CREAM | Freq: Three times a day (TID) | CUTANEOUS | Status: DC | PRN

## 2018-03-04 MED ORDER — DOFETILIDE 500 MCG PO CAPS
500.0000 ug | ORAL_CAPSULE | Freq: Two times a day (BID) | ORAL | Status: DC
  Administered 2018-03-04 – 2018-03-05 (×4): 500 ug via ORAL
  Filled 2018-03-04 (×4): qty 1

## 2018-03-04 NOTE — Progress Notes (Addendum)
Progress Note  Patient Name: Scott [name] Date of Encounter: 03/04/2018  Primary Cardiologist: No primary care provider on file.   Subjective   No complaints, tolerating medicine  Inpatient Medications    Scheduled Meds: . amLODipine  10 mg Oral QHS  . apixaban  5 mg Oral BID  . atorvastatin  40 mg Oral Daily  . dofetilide  500 mcg Oral BID  . fenofibrate  160 mg Oral Daily  . losartan  100 mg Oral Daily  . magnesium oxide  400 mg Oral Daily  . potassium chloride SA  20 mEq Oral BID  . sodium chloride flush  3 mL Intravenous Q12H   Continuous Infusions: . sodium chloride     PRN Meds: sodium chloride, HYDROcodone-acetaminophen, sodium chloride flush   Vital Signs    Vitals:   03/03/18 1241 03/03/18 2001 03/03/18 2131 03/04/18 0448  BP: (!) 177/87 (!) 155/101 (!) 152/87 (!) 156/90  Pulse: (!) 56 65 62 (!) 53  Resp:  16 16 18   Temp:  97.9 F (36.6 C) 98.4 F (36.9 C) 98 F (36.7 C)  TempSrc:  Oral Oral Oral  SpO2:  99% 99% 99%  Weight:    192 lb 9.6 oz (87.4 kg)  Height:        Intake/Output Summary (Last 24 hours) at 03/04/2018 3818 Last data filed at 03/03/2018 2001 Gross per 24 hour  Intake 480 ml  Output -  Net 480 ml   Filed Weights   03/03/18 1238 03/04/18 0448  Weight: 190 lb (86.2 kg) 192 lb 9.6 oz (87.4 kg)    Telemetry    AFib/flutter, 40's-50's overnight, 60-70's daytime, rare PVC, 4 beat wide complex is irregular, suspect abbarrent  - Personally Reviewed  ECG    AF 53bpm, QTc is OK - Personally Reviewed  Physical Exam   GEN: No acute distress.   Neck: No JVD Cardiac: iRRR, no murmurs, rubs, or gallops.  Respiratory: CTA b/l. GI: Soft, nontender, non-distended  MS: No edema; No deformity. Neuro:  Nonfocal  Psych: Normal affect   Labs    Chemistry Recent Labs  Lab 02/25/18 1203 03/03/18 1014  NA 140 141  K 3.2* 3.7  CL 106 107  CO2 25 24  GLUCOSE 248* 202*  BUN 19 17  CREATININE 1.04 1.11  CALCIUM 9.3 9.3    GFRNONAA >60 >60  GFRAA >60 >60  ANIONGAP 9 10     Hematology Recent Labs  Lab 03/04/18 0604  WBC 6.9  RBC 5.43  HGB 15.5  HCT 47.1  MCV 86.7  MCH 28.5  MCHC 32.9  RDW 12.9  PLT 286    Cardiac EnzymesNo results for input(s): TROPONINI in the last 168 hours. No results for input(s): TROPIPOC in the last 168 hours.   BNPNo results for input(s): BNP, PROBNP in the last 168 hours.   DDimer No results for input(s): DDIMER in the last 168 hours.   Radiology    No results found.  Cardiac Studies   10/31/16: TTE Study Conclusions - Left ventricle: The cavity size was normal. Wall thickness was   increased in a pattern of mild LVH. Systolic function was normal.   The estimated ejection fraction was in the range of 55% to 60%.   Wall motion was normal; there were no regional wall motion   abnormalities. The study is not technically sufficient to allow   evaluation of LV diastolic function. - Aortic valve: Trileaflet. Sclerosis without stenosis. There was  no regurgitation. - Mitral valve: Systolic bowing without prolapse. There was mild   regurgitation. - Left atrium: Moderately dilated. - Right atrium: The atrium was mildly dilated. - Tricuspid valve: There was mild regurgitation. - Pulmonary arteries: PA peak pressure: 27 mm Hg (S). - Inferior vena cava: The vessel was normal in size. The   respirophasic diameter changes were in the normal range (>= 50%),   consistent with normal central venous pressure. Impressions: -compared to a prior study in 2016, there are no significant   changes.  Patient Profile     67 y.o. male w/PMHx of PVD, CAD, HTN, HLD, prior CVA, and persistent AFib, here for Tikosyn initiation  Assessment & Plan    1. Persistent AFib     CHA2DS2Vasc is 4, on Eliquis, appropriately dosed     K+ 4.0     Mag 2.1     Creat stable, 1.11     QTc is OK  DCCV tomorrow if not in SR, pt is agreeable  2. HTN     Follow   3. PVD     Hx of R CEA,  no symptoms     On statin tx  4. CAD     No symptoms     On statin  5. Suspected OSA     Scheduled for out pt sleep study    For questions or updates, please contact [place] Please consult [email or web address] for contact info under Cardiology/STEMI.      Signed, Baldwin [place], PA-C  03/04/2018, 7:12 AM    I have seen, examined the patient, and reviewed the above assessment and plan.  Changes to above are made where necessary.  On exam, iRRR.  Remains in afib.  Continue tikosyn 500 mcg BID as qt is stable. bp is quite elevated Will add spironolactone 12.5mg  daily and follow  Plan cardioversion tomorrow if still in AF.  Co Sign: Thompson Grayer, MD 03/04/2018 10:41 AM

## 2018-03-04 NOTE — H&P (View-Only) (Signed)
Progress Note  Patient Name: Scott [name] Date of Encounter: 03/04/2018  Primary Cardiologist: No primary care provider on file.   Subjective   No complaints, tolerating medicine  Inpatient Medications    Scheduled Meds: . amLODipine  10 mg Oral QHS  . apixaban  5 mg Oral BID  . atorvastatin  40 mg Oral Daily  . dofetilide  500 mcg Oral BID  . fenofibrate  160 mg Oral Daily  . losartan  100 mg Oral Daily  . magnesium oxide  400 mg Oral Daily  . potassium chloride SA  20 mEq Oral BID  . sodium chloride flush  3 mL Intravenous Q12H   Continuous Infusions: . sodium chloride     PRN Meds: sodium chloride, HYDROcodone-acetaminophen, sodium chloride flush   Vital Signs    Vitals:   03/03/18 1241 03/03/18 2001 03/03/18 2131 03/04/18 0448  BP: (!) 177/87 (!) 155/101 (!) 152/87 (!) 156/90  Pulse: (!) 56 65 62 (!) 53  Resp:  16 16 18   Temp:  97.9 F (36.6 C) 98.4 F (36.9 C) 98 F (36.7 C)  TempSrc:  Oral Oral Oral  SpO2:  99% 99% 99%  Weight:    192 lb 9.6 oz (87.4 kg)  Height:        Intake/Output Summary (Last 24 hours) at 03/04/2018 6222 Last data filed at 03/03/2018 2001 Gross per 24 hour  Intake 480 ml  Output -  Net 480 ml   Filed Weights   03/03/18 1238 03/04/18 0448  Weight: 190 lb (86.2 kg) 192 lb 9.6 oz (87.4 kg)    Telemetry    AFib/flutter, 40's-50's overnight, 60-70's daytime, rare PVC, 4 beat wide complex is irregular, suspect abbarrent  - Personally Reviewed  ECG    AF 53bpm, QTc is OK - Personally Reviewed  Physical Exam   GEN: No acute distress.   Neck: No JVD Cardiac: iRRR, no murmurs, rubs, or gallops.  Respiratory: CTA b/l. GI: Soft, nontender, non-distended  MS: No edema; No deformity. Neuro:  Nonfocal  Psych: Normal affect   Labs    Chemistry Recent Labs  Lab 02/25/18 1203 03/03/18 1014  NA 140 141  K 3.2* 3.7  CL 106 107  CO2 25 24  GLUCOSE 248* 202*  BUN 19 17  CREATININE 1.04 1.11  CALCIUM 9.3 9.3    GFRNONAA >60 >60  GFRAA >60 >60  ANIONGAP 9 10     Hematology Recent Labs  Lab 03/04/18 0604  WBC 6.9  RBC 5.43  HGB 15.5  HCT 47.1  MCV 86.7  MCH 28.5  MCHC 32.9  RDW 12.9  PLT 286    Cardiac EnzymesNo results for input(s): TROPONINI in the last 168 hours. No results for input(s): TROPIPOC in the last 168 hours.   BNPNo results for input(s): BNP, PROBNP in the last 168 hours.   DDimer No results for input(s): DDIMER in the last 168 hours.   Radiology    No results found.  Cardiac Studies   10/31/16: TTE Study Conclusions - Left ventricle: The cavity size was normal. Wall thickness was   increased in a pattern of mild LVH. Systolic function was normal.   The estimated ejection fraction was in the range of 55% to 60%.   Wall motion was normal; there were no regional wall motion   abnormalities. The study is not technically sufficient to allow   evaluation of LV diastolic function. - Aortic valve: Trileaflet. Sclerosis without stenosis. There was  no regurgitation. - Mitral valve: Systolic bowing without prolapse. There was mild   regurgitation. - Left atrium: Moderately dilated. - Right atrium: The atrium was mildly dilated. - Tricuspid valve: There was mild regurgitation. - Pulmonary arteries: PA peak pressure: 27 mm Hg (S). - Inferior vena cava: The vessel was normal in size. The   respirophasic diameter changes were in the normal range (>= 50%),   consistent with normal central venous pressure. Impressions: -compared to a prior study in 2016, there are no significant   changes.  Patient Profile     67 y.o. male w/PMHx of PVD, CAD, HTN, HLD, prior CVA, and persistent AFib, here for Tikosyn initiation  Assessment & Plan    1. Persistent AFib     CHA2DS2Vasc is 4, on Eliquis, appropriately dosed     K+ 4.0     Mag 2.1     Creat stable, 1.11     QTc is OK  DCCV tomorrow if not in SR, pt is agreeable  2. HTN     Follow   3. PVD     Hx of R CEA,  no symptoms     On statin tx  4. CAD     No symptoms     On statin  5. Suspected OSA     Scheduled for out pt sleep study    For questions or updates, please contact [place] Please consult [email or web address] for contact info under Cardiology/STEMI.      Signed, Baldwin Jamaica, PA-C  03/04/2018, 7:12 AM    I have seen, examined the patient, and reviewed the above assessment and plan.  Changes to above are made where necessary.  On exam, iRRR.  Remains in afib.  Continue tikosyn 500 mcg BID as qt is stable. bp is quite elevated Will add spironolactone 12.5mg  daily and follow  Plan cardioversion tomorrow if still in AF.  Co Sign: Thompson Grayer, MD 03/04/2018 10:41 AM

## 2018-03-05 ENCOUNTER — Encounter (HOSPITAL_COMMUNITY)
Admission: RE | Disposition: A | Discharge status: Home / Self Care | Payer: Self-pay | Source: Ambulatory Visit | Attending: Internal Medicine

## 2018-03-05 ENCOUNTER — Ambulatory Visit (HOSPITAL_COMMUNITY): Admission: RE | Admit: 2018-03-05 | Payer: BLUE CROSS/BLUE SHIELD | Source: Ambulatory Visit | Admitting: Cardiology

## 2018-03-05 ENCOUNTER — Inpatient Hospital Stay (HOSPITAL_COMMUNITY): Payer: BLUE CROSS/BLUE SHIELD | Admitting: Anesthesiology

## 2018-03-05 ENCOUNTER — Encounter (HOSPITAL_COMMUNITY): Payer: Self-pay

## 2018-03-05 DIAGNOSIS — I4891 Unspecified atrial fibrillation: Secondary | ICD-10-CM

## 2018-03-05 HISTORY — PX: CARDIOVERSION: SHX1299

## 2018-03-05 LAB — BASIC METABOLIC PANEL
ANION GAP: 7 (ref 5–15)
BUN: 18 mg/dL (ref 6–20)
CALCIUM: 8.5 mg/dL — AB (ref 8.9–10.3)
CO2: 26 mmol/L (ref 22–32)
CREATININE: 1.12 mg/dL (ref 0.61–1.24)
Chloride: 106 mmol/L (ref 101–111)
Glucose, Bld: 133 mg/dL — ABNORMAL HIGH (ref 65–99)
Potassium: 4.1 mmol/L (ref 3.5–5.1)
SODIUM: 139 mmol/L (ref 135–145)

## 2018-03-05 LAB — MAGNESIUM: Magnesium: 2.1 mg/dL (ref 1.7–2.4)

## 2018-03-05 SURGERY — CARDIOVERSION
Anesthesia: General

## 2018-03-05 MED ORDER — PROPOFOL 10 MG/ML IV BOLUS
INTRAVENOUS | Status: DC | PRN
  Administered 2018-03-05: 100 mg via INTRAVENOUS
  Administered 2018-03-05: 50 mg via INTRAVENOUS

## 2018-03-05 MED ORDER — LIDOCAINE HCL (CARDIAC) PF 100 MG/5ML IV SOSY
PREFILLED_SYRINGE | INTRAVENOUS | Status: DC | PRN
  Administered 2018-03-05: 60 mg via INTRAVENOUS

## 2018-03-05 NOTE — Anesthesia Postprocedure Evaluation (Signed)
Anesthesia Post Note  Patient: Scott [name]  Procedure(s) Performed: CARDIOVERSION (N/A )     Patient location during evaluation: Endoscopy Anesthesia Type: General Level of consciousness: awake and alert, oriented and patient cooperative Pain management: pain level controlled Vital Signs Assessment: post-procedure vital signs reviewed and stable Respiratory status: spontaneous breathing, nonlabored ventilation and respiratory function stable Cardiovascular status: blood pressure returned to baseline and stable Postop Assessment: no apparent nausea or vomiting Anesthetic complications: no    Last Vitals:  Vitals:   03/05/18 1222 03/05/18 1350  BP: (!) 176/97 (!) 173/101  Pulse: 65 63  Resp: 16 (!) 26  Temp: 36.6 C (!) 36.4 C  SpO2: 100% 100%    Last Pain:  Vitals:   03/05/18 1350  TempSrc: Oral  PainSc:                  ,E. 

## 2018-03-05 NOTE — Progress Notes (Addendum)
Progress Note  Patient Name: Scott [name] Date of Encounter: 03/05/2018  Primary Cardiologist: Dr. Acie Fredrickson Electrophysiologist: Dr. Rayann Heman  Subjective   No complaints, tolerating medicine  Inpatient Medications    Scheduled Meds: . amLODipine  10 mg Oral QHS  . apixaban  5 mg Oral BID  . atorvastatin  40 mg Oral Daily  . dofetilide  500 mcg Oral BID  . fenofibrate  160 mg Oral Daily  . losartan  100 mg Oral Daily  . magnesium oxide  400 mg Oral Daily  . potassium chloride SA  20 mEq Oral BID  . sodium chloride flush  3 mL Intravenous Q12H  . sodium chloride flush  3 mL Intravenous Q12H  . spironolactone  12.5 mg Oral Daily   Continuous Infusions: . sodium chloride    . sodium chloride     PRN Meds: sodium chloride, HYDROcodone-acetaminophen, hydrocortisone cream, sodium chloride flush, sodium chloride flush   Vital Signs    Vitals:   03/04/18 1330 03/04/18 1600 03/04/18 2050 03/05/18 0656  BP: (!) 160/82 (!) 158/88 (!) 146/90 (!) 163/89  Pulse: 67 (!) 51 (!) 55 60  Resp: 17 16 18 20   Temp: 97.9 F (36.6 C) 97.6 F (36.4 C) 98.4 F (36.9 C) 97.9 F (36.6 C)  TempSrc: Oral Oral Oral Oral  SpO2: 100% 100% 99% 98%  Weight:      Height:        Intake/Output Summary (Last 24 hours) at 03/05/2018 0951 Last data filed at 03/04/2018 1600 Gross per 24 hour  Intake 600 ml  Output -  Net 600 ml   Filed Weights   03/03/18 1238 03/04/18 0448  Weight: 190 lb (86.2 kg) 192 lb 9.6 oz (87.4 kg)    Telemetry    AFib/flutter, 40's-50's overnight, 60-70's daytime  - Personally Reviewed  ECG    AF 55bpm, reviewed with Dr. Rayann Heman, QTc ok to continue - Personally Reviewed  Physical Exam   Exam is unchanged today GEN: No acute distress.   Neck: No JVD Cardiac: iRRR, no murmurs, rubs, or gallops.  Respiratory: CTA b/l. GI: Soft, nontender, non-distended  MS: No edema; No deformity. Neuro:  Nonfocal  Psych: Normal affect   Labs    Chemistry Recent Labs    Lab 03/03/18 1014 03/04/18 0604 03/05/18 0421  NA 141 141 139  K 3.7 4.0 4.1  CL 107 109 106  CO2 24 27 26   GLUCOSE 202* 153* 133*  BUN 17 19 18   CREATININE 1.11 1.11 1.12  CALCIUM 9.3 8.5* 8.5*  GFRNONAA >60 >60 >60  GFRAA >60 >60 >60  ANIONGAP 10 5 7      Hematology Recent Labs  Lab 03/04/18 0604  WBC 6.9  RBC 5.43  HGB 15.5  HCT 47.1  MCV 86.7  MCH 28.5  MCHC 32.9  RDW 12.9  PLT 286    Cardiac EnzymesNo results for input(s): TROPONINI in the last 168 hours. No results for input(s): TROPIPOC in the last 168 hours.   BNPNo results for input(s): BNP, PROBNP in the last 168 hours.   DDimer No results for input(s): DDIMER in the last 168 hours.   Radiology    No results found.  Cardiac Studies   10/31/16: TTE Study Conclusions - Left ventricle: The cavity size was normal. Wall thickness was   increased in a pattern of mild LVH. Systolic function was normal.   The estimated ejection fraction was in the range of 55% to 60%.   Wall  motion was normal; there were no regional wall motion   abnormalities. The study is not technically sufficient to allow   evaluation of LV diastolic function. - Aortic valve: Trileaflet. Sclerosis without stenosis. There was   no regurgitation. - Mitral valve: Systolic bowing without prolapse. There was mild   regurgitation. - Left atrium: Moderately dilated. - Right atrium: The atrium was mildly dilated. - Tricuspid valve: There was mild regurgitation. - Pulmonary arteries: PA peak pressure: 27 mm Hg (S). - Inferior vena cava: The vessel was normal in size. The   respirophasic diameter changes were in the normal range (>= 50%),   consistent with normal central venous pressure. Impressions: -compared to a prior study in 2016, there are no significant   changes.  Patient Profile     67 y.o. male w/PMHx of PVD, CAD, HTN, HLD, prior CVA, and persistent AFib, here for Tikosyn initiation  Assessment & Plan    1. Persistent  AFib     CHA2DS2Vasc is 4, on Eliquis, appropriately dosed     K+ 4.1     Mag 2.1     Creat stable, 1.12     EKG is reviewed with Dr. Rayann Heman, QTc is OK, continue load, unchanged dose  DCCV tomorrow this afternoon  2. HTN     Follow   3. PVD     Hx of R CEA, no symptoms     On statin tx  4. CAD     No symptoms     On statin  5. Suspected OSA     Scheduled for out pt sleep study    For questions or updates, please contact [place] Please consult [email or web address] for contact info under Cardiology/STEMI.      Signed, Baldwin Jamaica, PA-C  03/05/2018, 9:51 AM     I have seen, examined the patient, and reviewed the above assessment and plan.  Changes to above are made where necessary.  On exam, iRRR.  Cardioversion unsuccessful today.  Will continue tikosyn load and repeat cardioversion in a week.  BP is elevated spironolactone added.  Will increase to 25mg  daily tomorrow if still elevated.  Co Sign: Thompson Grayer, MD 03/05/2018 5:44 PM

## 2018-03-05 NOTE — Interval H&P Note (Signed)
History and Physical Interval Note:  03/05/2018 1:28 PM  Scott [name]  has presented today for surgery, with the diagnosis of a fib  The various methods of treatment have been discussed with the patient and family. After consideration of risks, benefits and other options for treatment, the patient has consented to  Procedure(s): CARDIOVERSION (N/A) as a surgical intervention .  [name] history has been reviewed, patient examined, no change in status, stable for surgery.  I have reviewed the patient's chart and labs.  Questions were answered to the patient's satisfaction.     [place] [employer]

## 2018-03-05 NOTE — Procedures (Addendum)
Electrical Cardioversion Procedure Note DA AUTHEMENT [id number] 1951-03-28  Procedure: Electrical Cardioversion Indications:  Atrial Fibrillation  Procedure Details Consent: Risks of procedure as well as the alternatives and risks of each were explained to the (patient/caregiver).  Consent for procedure obtained. Time Out: Verified patient identification, verified procedure, site/side was marked, verified correct patient position, special equipment/implants available, medications/allergies/relevent history reviewed, required imaging and test results available.  Performed  Patient placed on cardiac monitor, pulse oximetry, supplemental oxygen as necessary.  Sedation given: Propofol per anesthesiology Pacer pads placed anterior and posterior chest.  Cardioverted 2 times.  Cardioverted at [place].  Evaluation Findings: Post procedure EKG shows: NSR => atrial fibrillation Complications: None Patient did tolerate procedure well.  Patient was cardioverted twice.  The first time he stayed in NSR for about 2-3 minutes.  The second time he stayed in NSR for about 30 seconds. He is on Tikosyn. Will let EP know that DCCV failed.    Loralie Champagne 03/05/2018, 1:38 PM

## 2018-03-05 NOTE — Anesthesia Preprocedure Evaluation (Addendum)
Anesthesia Evaluation  Patient identified by MRN, date of birth, ID band Patient awake    Reviewed: Allergy & Precautions, H&P , NPO status , Patient's Chart, lab work & pertinent test results  Airway Mallampati: II  TM Distance: >3 FB Neck ROM: Full    Dental no notable dental hx. (+) Teeth Intact, Dental Advisory Given   Pulmonary neg pulmonary ROS, former smoker,    Pulmonary exam normal breath sounds clear to auscultation       Cardiovascular hypertension, + CAD, + Past MI and + Peripheral Vascular Disease  + dysrhythmias Atrial Fibrillation  Rhythm:Irregular Rate:Normal     Neuro/Psych TIACVA, No Residual Symptoms negative psych ROS   GI/Hepatic negative GI ROS, Neg liver ROS,   Endo/Other  negative endocrine ROS  Renal/GU negative Renal ROS  negative genitourinary   Musculoskeletal  (+) Arthritis , Osteoarthritis,    Abdominal   Peds  Hematology negative hematology ROS (+)   Anesthesia Other Findings   Reproductive/Obstetrics negative OB ROS                            Anesthesia Physical Anesthesia Plan  ASA: III  Anesthesia Plan: General   Post-op Pain Management:    Induction: Intravenous  PONV Risk Score and Plan: 2 and Treatment may vary due to age or medical condition  Airway Management Planned: Mask  Additional Equipment:   Intra-op Plan:   Post-operative Plan:   Informed Consent: I have reviewed the patients History and Physical, chart, labs and discussed the procedure including the risks, benefits and alternatives for the proposed anesthesia with the patient or authorized representative who has indicated his/her understanding and acceptance.   Dental advisory given  Plan Discussed with: CRNA  Anesthesia Plan Comments:         Anesthesia Quick Evaluation

## 2018-03-05 NOTE — Transfer of Care (Signed)
Immediate Anesthesia Transfer of Care Note  Patient: Scott [name] REASON  Procedure(s) Performed: CARDIOVERSION (N/A )  Patient Location: PACU and Endoscopy Unit  Anesthesia Type:General  Level of Consciousness: awake, alert , oriented and patient cooperative  Airway & Oxygen Therapy: Patient Spontanous Breathing and Patient connected to nasal cannula oxygen  Post-op Assessment: Report given to RN and Post -op Vital signs reviewed and stable  Post vital signs: Reviewed and stable  Last Vitals:  Vitals Value Taken Time  BP 173/101 03/05/2018  1:50 PM  Temp 36.4 C 03/05/2018  1:50 PM  Pulse 63 03/05/2018  1:50 PM  Resp 26 03/05/2018  1:50 PM  SpO2 100 % 03/05/2018  1:50 PM    Last Pain:  Vitals:   03/05/18 1350  TempSrc: Oral  PainSc:       Patients Stated Pain Goal: 0 (26/33/35 4562)  Complications: No apparent anesthesia complications

## 2018-03-05 NOTE — Progress Notes (Signed)
Tikosyn given at 2123. Pt's QTc at 0043 is 514.

## 2018-03-06 LAB — BASIC METABOLIC PANEL
ANION GAP: 7 (ref 5–15)
BUN: 14 mg/dL (ref 6–20)
CO2: 25 mmol/L (ref 22–32)
Calcium: 8.2 mg/dL — ABNORMAL LOW (ref 8.9–10.3)
Chloride: 107 mmol/L (ref 101–111)
Creatinine, Ser: 1.01 mg/dL (ref 0.61–1.24)
GFR calc Af Amer: >60 mL/min (ref >60)
GFR calc non Af Amer: >60 mL/min (ref >60)
GLUCOSE: 139 mg/dL — AB (ref 65–99)
POTASSIUM: 3.7 mmol/L (ref 3.5–5.1)
Sodium: 139 mmol/L (ref 135–145)

## 2018-03-06 LAB — MAGNESIUM: Magnesium: 1.9 mg/dL (ref 1.7–2.4)

## 2018-03-06 MED ORDER — SPIRONOLACTONE 25 MG PO TABS: 12.5000 mg | ORAL_TABLET | Freq: Every day | ORAL | 6 refills | Status: DC

## 2018-03-06 MED ORDER — AMIODARONE HCL 200 MG PO TABS: 200.0000 mg | ORAL_TABLET | Freq: Two times a day (BID) | ORAL | 1 refills | Status: DC

## 2018-03-06 NOTE — Discharge Summary (Addendum)
ELECTROPHYSIOLOGY PROCEDURE DISCHARGE SUMMARY    Patient ID: Scott [name],  MRN: [id number], DOB/AGE: 06-06-1951 67 y.o.  Admit date: 03/03/2018 Discharge date: 03/06/2018  Primary Care Physician: Shirline Frees, MD  Primary Cardiologist: Dr. Acie Fredrickson Electrophysiologist: Dr. Rayann Heman  Primary Discharge Diagnosis:  1.  Persistent AFib      CHA2DS2Vasc is 4, on Eliquis  Secondary Discharge Diagnosis:  1. HTN 2. PVD 3. CAD 4. Suspect OSA     Pt reports schedueld for out pt sleep study  No Known Allergies   Procedures This Admission:  1.  Tikosyn loading 2.  Direct current cardioversion on 03/05/18 by Dr Aundra Dubin which resulted in [place] with ERAF after 2-53minutes, after 2nd DCCV SR with ERAF in only 30 seconds  Brief HPI: Scott [name] is a 67 y.o. male with a past medical history as noted above. He is followed by EP in the outpatient setting for his AF.  Treatment options of atrial fibrillation were discussed, risks, benefits, and alternatives to Tikosyn were reviewed with the patient who wished to proceed.    Hospital Course:  The patient was admitted and Tikosyn was initiated.  Renal function and electrolytes were followed during the hospitalization.  On 03/05/18 he underwent direct current cardioversion though did not hold SR with ERAF.  He was monitored until discharge on telemetry which demonstrated AFib/flutter w/CVR. Initial strategy was to keep course with Tiksoyn though he developed QT prolongation, and Tikosyn was discontinued.   On the day of discharge, the patient feels well, no CP, palpitations or SOB.  He was seen and examined by Dr Rayann Heman who considered him stable for discharge to home.  Dr. Rayann Heman discussed with the patient options of ongoing rate control vs rhythm control strategy with amiodarone.  Discussed potential side effects of amiodarone.  The patient would very much like to continue to pursue rhythm control, has been felt that his generalized fatigue and lack  of stamina is 2/2 the AF.  Initial plan was to start Monday amiodarone 200mg  BID for 4 weeks with plans for DCCV.  After much discussion, the patient mentions he is seeing a neurosurgeon for spinal stenosis and is likely having steroid injections done in the next couple weeks.  Given this, and potential need to be off his Eliquis, we have decided to hold off for now rhythm control efforts.  We will NOT start amiodarone, will arrange f/u with the AFib clinic in 3 weeks, by then he will have had his sleep study completed, will have been in touch with his neurosurgeon to know timing of back injections and if/or for how long he will need to interrupt his Eliquis.   Physical Exam: Vitals:   03/05/18 1400 03/05/18 1434 03/05/18 2100 03/06/18 0527  BP: (!) 156/106 (!) 177/87 (!) 163/93 (!) 147/87  Pulse: 60 (!) 58  (!) 52  Resp: 16  20 18   Temp:  97.8 F (36.6 C) 97.7 F (36.5 C) 98.3 F (36.8 C)  TempSrc:  Oral Oral Oral  SpO2: 98% 99% 99% 100%  Weight:      Height:        GEN- The patient is well appearing, alert and oriented x 3 today.   HEENT: normocephalic, atraumatic; sclera clear, conjunctiva pink; hearing intact; oropharynx clear; neck supple, no JVP Lymph- no cervical lymphadenopathy Lungs- CTA b/l, normal work of breathing.  No wheezes, rales, rhonchi Heart- iRRR, no murmurs, rubs or gallops, PMI not laterally displaced GI- soft, non-tender, non-distended, Extremities-  no clubbing, cyanosis, or edema MS- no significant deformity or atrophy Skin- warm and dry, no rash or lesion Psych- euthymic mood, full affect Neuro- strength and sensation are intact   Labs:   Lab Results  Component Value Date   WBC 6.9 03/04/2018   HGB 15.5 03/04/2018   HCT 47.1 03/04/2018   MCV 86.7 03/04/2018   PLT 286 03/04/2018    Recent Labs  Lab 03/06/18 0522  NA 139  K 3.7  CL 107  CO2 25  BUN 14  CREATININE 1.01  CALCIUM 8.2*  GLUCOSE 139*     Discharge Medications:  Allergies as of  03/06/2018   No Known Allergies     Medication List    TAKE these medications   amLODipine 10 MG tablet Commonly known as:  NORVASC Take 10 mg by mouth at bedtime.   atorvastatin 40 MG tablet Commonly known as:  LIPITOR Take 1 tablet (40 mg total) by mouth daily.   ELIQUIS 5 MG Tabs tablet Generic drug:  apixaban TAKE 1 TABLET(5 MG) BY MOUTH TWICE DAILY   fenofibrate 145 MG tablet Commonly known as:  TRICOR Take 145 mg by mouth daily.   HYDROcodone-acetaminophen 5-325 MG tablet Commonly known as:  NORCO/VICODIN Take 1-2 tablets by mouth every 6 (six) hours as needed (for pain). What changed:    how much to take  reasons to take this   losartan 100 MG tablet Commonly known as:  COZAAR Take 1 tablet (100 mg total) by mouth daily.   Magnesium 500 MG Caps Take by mouth.   nicotine 21 mg/24hr patch Commonly known as:  NICODERM CQ - dosed in mg/24 hours Place 1 patch (21 mg total) onto the skin daily.   potassium chloride SA 20 MEQ tablet Commonly known as:  K-DUR,KLOR-CON Take 1 tablet (20 mEq total) by mouth 2 (two) times daily. What changed:  when to take this   REFRESH OPTIVE OP Place 1 drop into both eyes every morning.   spironolactone 25 MG tablet Commonly known as:  ALDACTONE Take 0.5 tablets (12.5 mg total) by mouth daily. Start taking on:  03/07/2018       Disposition:  home Discharge Instructions    Diet - low sodium heart healthy   Complete by:  As directed    Increase activity slowly   Complete by:  As directed      Follow-up Information    [facility] up on 03/28/2018.   Specialty:  Cardiology Why:  9:00AM Contact information: [address] [id number] [place] [phone]          Duration of Discharge Encounter: Greater than 30 minutes including physician time.  Signed, Tommye Standard, PA-C 03/06/2018 12:23 PM   I have seen, examined the patient, and reviewed  the above assessment and plan.  Changes to above are made where necessary.  On exam, iRRR.  tikosyn not effective.  I have therefore stopped this medicine.  We discussed long term rate control vs amiodarone as options.  I am not optimistic that he will maintain sinus rhythm long term given his longstanding persistent afib.  I would not advise ablation unless we are able to achieve sinus with amiodarone first. He wishes to think about his options.  He will follow-up in AF clinic in 2 weeks.  Co Sign: Thompson Grayer, MD 03/06/2018 8:37 PM

## 2018-03-06 NOTE — Progress Notes (Signed)
Amiodarone prescription had already been sent to [facility], I called [phone] and cancelled prescription.  Tommye Standard, PA-C

## 2018-03-07 ENCOUNTER — Encounter (HOSPITAL_COMMUNITY): Payer: Self-pay | Admitting: Cardiology

## 2018-03-18 ENCOUNTER — Telehealth: Payer: Self-pay

## 2018-03-18 ENCOUNTER — Other Ambulatory Visit: Payer: Self-pay | Admitting: Neurosurgery

## 2018-03-18 ENCOUNTER — Telehealth: Payer: Self-pay | Admitting: Internal Medicine

## 2018-03-18 DIAGNOSIS — M48062 Spinal stenosis, lumbar region with neurogenic claudication: Secondary | ICD-10-CM

## 2018-03-18 NOTE — Telephone Encounter (Signed)
New message    Pt would like a call back to speak about his f/u appt and everything he has going on. He would like opinion. Please call

## 2018-03-18 NOTE — Telephone Encounter (Signed)
Follow up    Pt is calling to f/u about appt.

## 2018-03-18 NOTE — Telephone Encounter (Signed)
Spoke with patient who called to ask if he needs appointment with Dr. Acie Fredrickson on July 2. I advised that he will need to see Dr. Acie Fredrickson for general cardiology but that appointment can be pushed back while he works on finding appropriate treatment for a fib. He has an appointment with a fib clinic on 7/5. I moved his appointment with Dr. Acie Fredrickson to October 2 and he verbalized gratitude.

## 2018-03-18 NOTE — Telephone Encounter (Signed)
Spoke with patient who is asking if Dr. Acie Fredrickson can clear him soon so that he can get this procedure done prior to appointment with A Fib clinic next week  Dr. Acie Fredrickson is in agreement with signing the clearance

## 2018-03-18 NOTE — Telephone Encounter (Signed)
Scott [name] is at low risk for his lumbar ESI.   He may hold Eliquis for 2 days prior to procedure

## 2018-03-18 NOTE — Telephone Encounter (Signed)
   [facility] Pre-operative Risk Assessment    Request for surgical clearance:  1. What type of surgery is being performed? LUMBAR ESI   2. When is this surgery scheduled? TBD    3. What type of clearance is required (medical clearance vs. Pharmacy clearance to hold med vs. Both)? PHARMACY TO HOLD MED  4. Are there any medications that need to be held prior to surgery and how long? ELIQUIS, 2 DAYS    5. Practice name and name of physician performing surgery? [facility]   6. What is your office phone number [phone]    7.   What is your office fax number [phone]  8.   Anesthesia type (None, local, MAC, general) ? NONE LISTED   Scott [name] 03/18/2018, 4:33 PM  _________________________________________________________________   (provider comments below)

## 2018-03-18 NOTE — Telephone Encounter (Signed)
Reviewed Dr. Elmarie Shiley advice with patient who verbalized understanding and was grateful for our help

## 2018-03-18 NOTE — Telephone Encounter (Signed)
Note routed to [place]

## 2018-03-19 ENCOUNTER — Telehealth (HOSPITAL_COMMUNITY): Payer: Self-pay | Admitting: *Deleted

## 2018-03-19 NOTE — Telephone Encounter (Signed)
Patient called in stating he had a back injection scheduled for 7/3 - pt had cardioversion on 6/12 per Roderic Palau NP do not recommend stopping eliquis for 30 days post cardioversion. Pt states he will call and move out after 7/12 - he will also need to move out appt on 7/5 since he will be having interruption of eliquis before starting amiodarone. Pt states he will call back after he has been back on eliquis for 3 weeks uninterrupted to reschedule the appointment. As he may need a follow up injection 2 weeks after the initial one.

## 2018-03-19 NOTE — Telephone Encounter (Signed)
Chart already addressed as below, will remove from preop box.   PA-C

## 2018-03-25 ENCOUNTER — Ambulatory Visit: Payer: BLUE CROSS/BLUE SHIELD | Admitting: Cardiovascular Disease

## 2018-03-25 ENCOUNTER — Encounter

## 2018-03-26 ENCOUNTER — Other Ambulatory Visit: Payer: BLUE CROSS/BLUE SHIELD

## 2018-03-28 ENCOUNTER — Ambulatory Visit (HOSPITAL_COMMUNITY): Payer: BLUE CROSS/BLUE SHIELD | Admitting: Nurse Practitioner

## 2018-04-07 ENCOUNTER — Other Ambulatory Visit: Payer: Self-pay | Admitting: Neurosurgery

## 2018-04-07 ENCOUNTER — Ambulatory Visit
Admission: RE | Admit: 2018-04-07 | Discharge: 2018-04-07 | Disposition: A | Discharge status: Home / Self Care | Payer: BLUE CROSS/BLUE SHIELD | Source: Ambulatory Visit | Attending: Neurosurgery | Admitting: Neurosurgery

## 2018-04-07 DIAGNOSIS — G8929 Other chronic pain: Secondary | ICD-10-CM

## 2018-04-07 DIAGNOSIS — M48062 Spinal stenosis, lumbar region with neurogenic claudication: Secondary | ICD-10-CM

## 2018-04-07 DIAGNOSIS — M545 Low back pain: Principal | ICD-10-CM

## 2018-04-07 NOTE — Discharge Instructions (Signed)
Spinal Injection Discharge Instruction Sheet ° °1. You may resume a regular diet and any medications that you routinely take, including pain medications. ° °2. No driving the rest of the day of the procedure. ° °3. Light activity throughout the rest of the day.  Do not do any strenuous work, exercise, bending or lifting.  The day following the procedure, you may resume normal physical activity but you should refrain from exercising or physical therapy for at least three days. ° ° °Common Side Effects: ° °· Headaches- take your usual medications as directed by your physician.   ° °· Restlessness or inability to sleep- you may have trouble sleeping for the next few days.  Ask your referring physician if you need any medication for sleep if over the counter sleep medications do not help. ° °· Facial flushing or redness- this should subside within a few days. ° °· Increased pain- a temporary increase in pain a day or two following your procedure is not unusual.  Take your pain medication as prescribed by your referring physician.  You may use ice to the injection site as needed.  Please do not use heat for 24 hours. ° °· Leg cramps ° °Please contact our office at [phone] for the following symptoms: °· Fever greater than 100 degrees. °· Headaches unresolved with medication after 2-3 days. °· Increased swelling, pain, or redness at injection site. ° °Thank you for visiting our office. ° ° °You may resume Eliquis today. °

## 2018-04-09 ENCOUNTER — Ambulatory Visit
Admission: RE | Admit: 2018-04-09 | Discharge: 2018-04-09 | Disposition: A | Discharge status: Home / Self Care | Payer: BLUE CROSS/BLUE SHIELD | Source: Ambulatory Visit | Attending: Neurosurgery | Admitting: Neurosurgery

## 2018-04-09 DIAGNOSIS — G8929 Other chronic pain: Secondary | ICD-10-CM

## 2018-04-09 DIAGNOSIS — M545 Low back pain: Principal | ICD-10-CM

## 2018-04-09 MED ORDER — DIAZEPAM 5 MG PO TABS
10.0000 mg | ORAL_TABLET | Freq: Once | ORAL | Status: AC
  Administered 2018-04-09: 10 mg via ORAL

## 2018-04-09 MED ORDER — IOPAMIDOL (ISOVUE-M 200) INJECTION 41%
1.0000 mL | Freq: Once | INTRAMUSCULAR | Status: AC
  Administered 2018-04-09: 1 mL via EPIDURAL

## 2018-04-09 MED ORDER — METHYLPREDNISOLONE ACETATE 40 MG/ML INJ SUSP (RADIOLOG
120.0000 mg | Freq: Once | INTRAMUSCULAR | Status: AC
  Administered 2018-04-09: 120 mg via EPIDURAL

## 2018-04-09 NOTE — Discharge Instructions (Signed)
Spinal Injection Discharge Instruction Sheet ° °1. You may resume a regular diet and any medications that you routinely take, including pain medications. ° °2. No driving the rest of the day of the procedure. ° °3. Light activity throughout the rest of the day.  Do not do any strenuous work, exercise, bending or lifting.  The day following the procedure, you may resume normal physical activity but you should refrain from exercising or physical therapy for at least three days. ° ° °Common Side Effects: ° °· Headaches- take your usual medications as directed by your physician.   ° °· Restlessness or inability to sleep- you may have trouble sleeping for the next few days.  Ask your referring physician if you need any medication for sleep if over the counter sleep medications do not help. ° °· Facial flushing or redness- this should subside within a few days. ° °· Increased pain- a temporary increase in pain a day or two following your procedure is not unusual.  Take your pain medication as prescribed by your referring physician.  You may use ice to the injection site as needed.  Please do not use heat for 24 hours. ° °· Leg cramps ° °Please contact our office at [phone] for the following symptoms: °· Fever greater than 100 degrees. °· Headaches unresolved with medication after 2-3 days. °· Increased swelling, pain, or redness at injection site. ° °Thank you for visiting our office. ° ° °You may resume Eliquis today. °

## 2018-04-17 ENCOUNTER — Other Ambulatory Visit: Payer: Self-pay | Admitting: Neurosurgery

## 2018-04-17 DIAGNOSIS — M48062 Spinal stenosis, lumbar region with neurogenic claudication: Secondary | ICD-10-CM

## 2018-04-25 ENCOUNTER — Other Ambulatory Visit (HOSPITAL_COMMUNITY): Payer: Self-pay | Admitting: *Deleted

## 2018-04-25 MED ORDER — POTASSIUM CHLORIDE CRYS ER 20 MEQ PO TBCR: 20.0000 meq | EXTENDED_RELEASE_TABLET | Freq: Two times a day (BID) | ORAL | 1 refills | Status: DC

## 2018-05-19 ENCOUNTER — Other Ambulatory Visit (HOSPITAL_BASED_OUTPATIENT_CLINIC_OR_DEPARTMENT_OTHER): Payer: Self-pay

## 2018-05-19 DIAGNOSIS — I4891 Unspecified atrial fibrillation: Secondary | ICD-10-CM

## 2018-05-19 DIAGNOSIS — G4710 Hypersomnia, unspecified: Secondary | ICD-10-CM

## 2018-05-19 DIAGNOSIS — R5383 Other fatigue: Secondary | ICD-10-CM

## 2018-05-19 DIAGNOSIS — R0683 Snoring: Secondary | ICD-10-CM

## 2018-05-20 ENCOUNTER — Other Ambulatory Visit: Payer: Self-pay | Admitting: Neurosurgery

## 2018-05-20 DIAGNOSIS — M48062 Spinal stenosis, lumbar region with neurogenic claudication: Secondary | ICD-10-CM

## 2018-05-29 DIAGNOSIS — H43821 Vitreomacular adhesion, right eye: Secondary | ICD-10-CM | POA: Diagnosis not present

## 2018-06-03 ENCOUNTER — Inpatient Hospital Stay
Admission: RE | Admit: 2018-06-03 | Discharge: 2018-06-03 | Disposition: A | Discharge status: Home / Self Care | Payer: BLUE CROSS/BLUE SHIELD | Source: Ambulatory Visit | Attending: Neurosurgery | Admitting: Neurosurgery

## 2018-06-04 DIAGNOSIS — H43821 Vitreomacular adhesion, right eye: Secondary | ICD-10-CM | POA: Diagnosis not present

## 2018-06-11 DIAGNOSIS — H43813 Vitreous degeneration, bilateral: Secondary | ICD-10-CM | POA: Diagnosis not present

## 2018-06-11 DIAGNOSIS — H43821 Vitreomacular adhesion, right eye: Secondary | ICD-10-CM | POA: Diagnosis not present

## 2018-06-13 ENCOUNTER — Ambulatory Visit
Admission: RE | Admit: 2018-06-13 | Discharge: 2018-06-13 | Disposition: A | Discharge status: Home / Self Care | Payer: Self-pay | Source: Ambulatory Visit | Attending: Neurosurgery | Admitting: Neurosurgery

## 2018-06-13 DIAGNOSIS — M48062 Spinal stenosis, lumbar region with neurogenic claudication: Secondary | ICD-10-CM

## 2018-06-13 MED ORDER — METHYLPREDNISOLONE ACETATE 40 MG/ML INJ SUSP (RADIOLOG
120.0000 mg | Freq: Once | INTRAMUSCULAR | Status: AC
  Administered 2018-06-13: 120 mg via EPIDURAL

## 2018-06-13 MED ORDER — DIAZEPAM 5 MG PO TABS
10.0000 mg | ORAL_TABLET | Freq: Once | ORAL | Status: AC
  Administered 2018-06-13: 10 mg via ORAL

## 2018-06-13 MED ORDER — IOPAMIDOL (ISOVUE-M 200) INJECTION 41%
1.0000 mL | Freq: Once | INTRAMUSCULAR | Status: AC
  Administered 2018-06-13: 1 mL via EPIDURAL

## 2018-06-13 NOTE — Discharge Instructions (Signed)
Spinal Injection Discharge Instruction Sheet ° °1. You may resume a regular diet and any medications that you routinely take, including pain medications. ° °2. No driving the rest of the day of the procedure. ° °3. Light activity throughout the rest of the day.  Do not do any strenuous work, exercise, bending or lifting.  The day following the procedure, you may resume normal physical activity but you should refrain from exercising or physical therapy for at least three days. ° ° °Common Side Effects: ° °· Headaches- take your usual medications as directed by your physician.   ° °· Restlessness or inability to sleep- you may have trouble sleeping for the next few days.  Ask your referring physician if you need any medication for sleep if over the counter sleep medications do not help. ° °· Facial flushing or redness- this should subside within a few days. ° °· Increased pain- a temporary increase in pain a day or two following your procedure is not unusual.  Take your pain medication as prescribed by your referring physician.  You may use ice to the injection site as needed.  Please do not use heat for 24 hours. ° °· Leg cramps ° °Please contact our office at [phone] for the following symptoms: °· Fever greater than 100 degrees. °· Headaches unresolved with medication after 2-3 days. °· Increased swelling, pain, or redness at injection site. ° °Thank you for visiting our office. ° ° °You may resume Eliquis today. °

## 2018-06-18 ENCOUNTER — Ambulatory Visit (HOSPITAL_BASED_OUTPATIENT_CLINIC_OR_DEPARTMENT_OTHER): Payer: Medicare Other | Attending: Internal Medicine | Admitting: Internal Medicine

## 2018-06-18 VITALS — Ht 70.0 in | Wt 182.0 lb

## 2018-06-18 DIAGNOSIS — R5383 Other fatigue: Secondary | ICD-10-CM

## 2018-06-18 DIAGNOSIS — Z7901 Long term (current) use of anticoagulants: Secondary | ICD-10-CM | POA: Diagnosis not present

## 2018-06-18 DIAGNOSIS — I493 Ventricular premature depolarization: Secondary | ICD-10-CM | POA: Insufficient documentation

## 2018-06-18 DIAGNOSIS — R0683 Snoring: Secondary | ICD-10-CM | POA: Insufficient documentation

## 2018-06-18 DIAGNOSIS — G4710 Hypersomnia, unspecified: Secondary | ICD-10-CM | POA: Insufficient documentation

## 2018-06-18 DIAGNOSIS — Z79899 Other long term (current) drug therapy: Secondary | ICD-10-CM | POA: Insufficient documentation

## 2018-06-18 DIAGNOSIS — H43813 Vitreous degeneration, bilateral: Secondary | ICD-10-CM | POA: Diagnosis not present

## 2018-06-18 DIAGNOSIS — I4891 Unspecified atrial fibrillation: Secondary | ICD-10-CM

## 2018-06-18 DIAGNOSIS — H43821 Vitreomacular adhesion, right eye: Secondary | ICD-10-CM | POA: Diagnosis not present

## 2018-06-24 DIAGNOSIS — Z6826 Body mass index (BMI) 26.0-26.9, adult: Secondary | ICD-10-CM | POA: Diagnosis not present

## 2018-06-24 DIAGNOSIS — I10 Essential (primary) hypertension: Secondary | ICD-10-CM | POA: Diagnosis not present

## 2018-06-24 DIAGNOSIS — I480 Paroxysmal atrial fibrillation: Secondary | ICD-10-CM | POA: Diagnosis not present

## 2018-06-27 ENCOUNTER — Ambulatory Visit: Payer: BLUE CROSS/BLUE SHIELD | Admitting: Cardiovascular Disease

## 2018-06-27 NOTE — Procedures (Signed)
   NAME: Scott [name] DATE OF BIRTH:  [birth year] MEDICAL RECORD NUMBER [id number]  LOCATION: [facility]  PHYSICIAN: Marius Ditch  DATE OF STUDY: 06/18/2018  SLEEP STUDY TYPE: Nocturnal Polysomnogram               REFERRING PHYSICIAN: Marius Ditch, MD  INDICATION FOR STUDY: symptoms c/w OSA; failed HSAT times two.   EPWORTH SLEEPINESS SCORE:  9 HEIGHT: 5\' 10"  (177.8 cm)  WEIGHT: 182 lb (82.6 kg)    Body mass index is 26.11 kg/m.  NECK SIZE: 16.5 in.  MEDICATIONS  Patient self administered medications include: AMLODIPINE, ELIQUIS, MELATONIN. Medications administered during study include Sleep medicine administered - Melatonin at 09:30:19 PM.   SLEEP STUDY TECHNIQUE  A multi-channel overnight Polysomnography study was performed. The channels recorded and monitored were central and occipital EEG, electrooculogram (EOG), submentalis EMG (chin), nasal and oral airflow, thoracic and abdominal wall motion, anterior tibialis EMG, snore microphone, electrocardiogram, and a pulse oximetry.   TECHNICAL COMMENTS  Comments added by Technician: Patient had difficulty initiating sleep. Patient was restless all through the night. Patient had more than two awakenings to use the bathroom Comments added by Scorer: N/A   SLEEP ARCHITECTURE  The study was initiated at 10:13:53 PM and terminated at 4:44:18 AM. The total recorded time was 390.4 minutes. EEG confirmed total sleep time was 160 minutes yielding a sleep efficiency of 41.0%%. Sleep onset after lights out was 73.0 minutes with a REM latency of 255.5 minutes. The patient spent 14.1%% of the night in stage N1 sleep, 75.9%% in stage N2 sleep, 0.0%% in stage N3 and 10% in REM. Wake after sleep onset (WASO) was 157.4 minutes. The Arousal Index was 22.5/hour.   RESPIRATORY PARAMETERS  There were a total of 1 respiratory disturbances out of which 1 were apneas ( 1 obstructive, 0 mixed, 0 central) and 0 hypopneas. The  apnea/hypopnea index (AHI) was 0.4 events/hour. The central sleep apnea index was 0.0 events/hour. The REM AHI was 3.8 events/hour and NREM AHI was 0.0 events/hour. The supine AHI was N/A events/hour and the non supine AHI was 0.4 events/hour. Respiratory disturbances were associated with oxygen desaturation down to a nadir of 93.0% during sleep. The mean oxygen saturation during the study was 96.2%. The cumulative time under 88% oxygen saturation was 5.5 minutes.  LEG MOVEMENT DATA  The total leg movements were 40 with a resulting leg movement index of 15.0/hr . Associated arousal with leg movement index was 1.9/hr.   CARDIAC DATA  The underlying cardiac rhythm was most consistent with atrial fibrillation. Mean heart rate during sleep was 68.2 bpm. Additional rhythm abnormalities include PVCs.   IMPRESSIONS  No Significant Obstructive Sleep apnea(OSA)  DIAGNOSIS  Excessive daytime sleepiness.   RECOMMENDATIONS  No treatment for sleep disordered breathing is warranted.   Marius Ditch Sleep specialist, [place] Board [employer] Medicine  ELECTRONICALLY SIGNED ON:  06/27/2018, 8:43 PM [address] PH: [phone]   FX: [phone] [place]

## 2018-06-30 ENCOUNTER — Ambulatory Visit: Payer: BLUE CROSS/BLUE SHIELD | Admitting: Surgery

## 2018-06-30 ENCOUNTER — Encounter (HOSPITAL_COMMUNITY): Payer: BLUE CROSS/BLUE SHIELD

## 2018-07-07 ENCOUNTER — Encounter (HOSPITAL_COMMUNITY): Payer: BLUE CROSS/BLUE SHIELD

## 2018-07-07 ENCOUNTER — Ambulatory Visit: Payer: BLUE CROSS/BLUE SHIELD | Admitting: Surgery

## 2018-07-07 ENCOUNTER — Other Ambulatory Visit (HOSPITAL_COMMUNITY): Payer: BLUE CROSS/BLUE SHIELD

## 2018-07-09 DIAGNOSIS — H43813 Vitreous degeneration, bilateral: Secondary | ICD-10-CM | POA: Diagnosis not present

## 2018-07-09 DIAGNOSIS — H43821 Vitreomacular adhesion, right eye: Secondary | ICD-10-CM | POA: Diagnosis not present

## 2018-07-18 ENCOUNTER — Other Ambulatory Visit: Payer: Self-pay | Admitting: Pharmacist

## 2018-07-18 MED ORDER — APIXABAN 5 MG PO TABS: ORAL_TABLET | ORAL | 5 refills | Status: DC

## 2018-07-25 DIAGNOSIS — I4891 Unspecified atrial fibrillation: Secondary | ICD-10-CM | POA: Diagnosis not present

## 2018-07-28 ENCOUNTER — Other Ambulatory Visit: Payer: Self-pay

## 2018-07-28 DIAGNOSIS — E119 Type 2 diabetes mellitus without complications: Secondary | ICD-10-CM | POA: Diagnosis not present

## 2018-07-28 DIAGNOSIS — I10 Essential (primary) hypertension: Secondary | ICD-10-CM | POA: Diagnosis not present

## 2018-07-28 DIAGNOSIS — I4891 Unspecified atrial fibrillation: Secondary | ICD-10-CM | POA: Diagnosis not present

## 2018-07-28 DIAGNOSIS — I4819 Other persistent atrial fibrillation: Secondary | ICD-10-CM | POA: Diagnosis not present

## 2018-07-28 DIAGNOSIS — G459 Transient cerebral ischemic attack, unspecified: Secondary | ICD-10-CM | POA: Diagnosis not present

## 2018-07-28 DIAGNOSIS — I252 Old myocardial infarction: Secondary | ICD-10-CM | POA: Diagnosis not present

## 2018-07-28 DIAGNOSIS — I2510 Atherosclerotic heart disease of native coronary artery without angina pectoris: Secondary | ICD-10-CM | POA: Diagnosis not present

## 2018-07-28 DIAGNOSIS — R5383 Other fatigue: Secondary | ICD-10-CM | POA: Diagnosis not present

## 2018-07-28 DIAGNOSIS — Z7901 Long term (current) use of anticoagulants: Secondary | ICD-10-CM | POA: Diagnosis not present

## 2018-07-28 DIAGNOSIS — I779 Disorder of arteries and arterioles, unspecified: Secondary | ICD-10-CM

## 2018-07-28 DIAGNOSIS — F17210 Nicotine dependence, cigarettes, uncomplicated: Secondary | ICD-10-CM | POA: Diagnosis not present

## 2018-07-28 DIAGNOSIS — E7800 Pure hypercholesterolemia, unspecified: Secondary | ICD-10-CM | POA: Diagnosis not present

## 2018-08-04 ENCOUNTER — Other Ambulatory Visit: Payer: Self-pay

## 2018-08-04 ENCOUNTER — Ambulatory Visit (INDEPENDENT_AMBULATORY_CARE_PROVIDER_SITE_OTHER)
Admission: RE | Admit: 2018-08-04 | Discharge: 2018-08-04 | Disposition: A | Discharge status: Home / Self Care | Payer: Medicare Other | Source: Ambulatory Visit | Attending: Surgery | Admitting: Surgery

## 2018-08-04 ENCOUNTER — Ambulatory Visit (INDEPENDENT_AMBULATORY_CARE_PROVIDER_SITE_OTHER): Payer: Medicare Other | Admitting: Surgery

## 2018-08-04 ENCOUNTER — Ambulatory Visit (HOSPITAL_COMMUNITY)
Admission: RE | Admit: 2018-08-04 | Discharge: 2018-08-04 | Disposition: A | Discharge status: Home / Self Care | Payer: Medicare Other | Source: Ambulatory Visit | Attending: Surgery | Admitting: Surgery

## 2018-08-04 ENCOUNTER — Encounter: Payer: Self-pay | Admitting: Surgery

## 2018-08-04 ENCOUNTER — Ambulatory Visit (INDEPENDENT_AMBULATORY_CARE_PROVIDER_SITE_OTHER)
Admission: RE | Admit: 2018-08-04 | Discharge: 2018-08-04 | Disposition: A | Discharge status: Home / Self Care | Payer: Medicare Other | Source: Ambulatory Visit | Attending: Family | Admitting: Family

## 2018-08-04 VITALS — BP 133/79 | HR 52 | Temp 97.1°F | Resp 18 | Ht 70.0 in | Wt 177.0 lb

## 2018-08-04 DIAGNOSIS — Z95820 Peripheral vascular angioplasty status with implants and grafts: Secondary | ICD-10-CM | POA: Diagnosis not present

## 2018-08-04 DIAGNOSIS — I779 Disorder of arteries and arterioles, unspecified: Secondary | ICD-10-CM | POA: Diagnosis not present

## 2018-08-04 DIAGNOSIS — F17200 Nicotine dependence, unspecified, uncomplicated: Secondary | ICD-10-CM | POA: Insufficient documentation

## 2018-08-04 DIAGNOSIS — I6521 Occlusion and stenosis of right carotid artery: Secondary | ICD-10-CM

## 2018-08-04 NOTE — Progress Notes (Signed)
[facility] [place]  Patient name: Scott [name] MRN: [id number] DOB: 1950/12/11 Sex: male   REASON FOR VISIT:    Follow up  [place]:   Scott [name] is a 67 y.o. male who presented to the hospital with several episodes of right-sided visual changes as well as some episodes of perioral tingling and clumsiness in his left arm.  A MRI showed several areas of punctate lesions within the right brain consistent with stroke.  He had a CT angiogram that showed a high-grade right carotid stenosis.  Therefore on 12/07/2017 he underwent right carotid endarterectomy with patch angioplasty.  Intraoperative findings included a 95% stenosis.  He had extensive calcified plaque within the distal common carotid artery and internal carotid artery which extended well up underneath the hypoglossal nerve.  He is status post right SFA stenting by Dr. Oneida Alar for a total occlusion on 01-25-2018.  He recently underwent atrial fibrillation at [facility].  PAST MEDICAL HISTORY:   Past Medical History:  Diagnosis Date  . CAD (coronary artery disease)   . Cerebrovascular disease   . HLD (hyperlipidemia)   . HTN (hypertension)   . IBS (irritable bowel syndrome)   . MI, old   . Persistent atrial fibrillation   . PVD (peripheral vascular disease) ([place])   . Stroke (cerebrum) ([place])   . Tobacco abuse      FAMILY HISTORY:   Family History  Problem Relation Age of Onset  . Heart disease Father   . Melanoma Father   . Testicular cancer Father   . Hypertension Mother   . Arthritis Mother   . Arthritis Sister   . Lung disease Neg Hx     SOCIAL HISTORY:   Social History   Tobacco Use  . Smoking status: Former Smoker    Years: 43.00    Types: Cigarettes    Start date: 06/02/1971    Last attempt to quit: 12/09/2017    Years since quitting: 0.6  . Smokeless tobacco: Never Used  . Tobacco comment: pt vapes  Substance Use Topics  .  Alcohol use: Yes    Alcohol/week: 0.0 standard drinks    Comment: occasionaly     ALLERGIES:   No Known Allergies   CURRENT MEDICATIONS:   Current Outpatient Medications  Medication Sig Dispense Refill  . amiodarone (PACERONE) 200 MG tablet     . amLODipine (NORVASC) 10 MG tablet Take 10 mg by mouth at bedtime.     Marland Kitchen apixaban (ELIQUIS) 5 MG TABS tablet TAKE 1 TABLET(5 MG) BY MOUTH TWICE DAILY 60 tablet 5  . atorvastatin (LIPITOR) 40 MG tablet Take 1 tablet (40 mg total) by mouth daily. 30 tablet 0  . Carboxymethylcellul-Glycerin (REFRESH OPTIVE OP) Place 1 drop into both eyes every morning.     . chlorthalidone (HYGROTON) 25 MG tablet Take by mouth.    . fenofibrate (TRICOR) 145 MG tablet Take 145 mg by mouth daily.    Marland Kitchen HYDROcodone-acetaminophen (NORCO/VICODIN) 5-325 MG tablet Take 1-2 tablets by mouth every 6 (six) hours as needed (for pain). (Patient taking differently: Take 2 tablets by mouth every 6 (six) hours as needed for severe pain. ) 8 tablet 0  . losartan (COZAAR) 100 MG tablet Take 1 tablet (100 mg total) by mouth daily. 30 tablet 3  . potassium chloride SA (K-DUR,KLOR-CON) 20 MEQ tablet Take 1 tablet (20 mEq total) by mouth 2 (two) times daily. 180 tablet 1  . spironolactone (ALDACTONE) 25 MG  tablet Take 0.5 tablets (12.5 mg total) by mouth daily. 15 tablet 6  . Magnesium 500 MG CAPS Take by mouth.    . nicotine (NICODERM CQ - DOSED IN MG/24 HOURS) 21 mg/24hr patch Place 1 patch (21 mg total) onto the skin daily. (Patient not taking: Reported on 03/03/2018) 28 patch 0  . pantoprazole (PROTONIX) 40 MG tablet      No current facility-administered medications for this visit.     REVIEW OF SYSTEMS:   [X]  denotes positive finding, [ ]  denotes negative finding Cardiac  Comments:  Chest pain or chest pressure:    Shortness of breath upon exertion:    Short of breath when lying flat:    Irregular heart rhythm:        Vascular    Pain in calf, thigh, or hip brought on  by ambulation:    Pain in feet at night that wakes you up from your sleep:     Blood clot in your veins:    Leg swelling:         Pulmonary    Oxygen at home:    Productive cough:     Wheezing:         Neurologic    Sudden weakness in arms or legs:     Sudden numbness in arms or legs:     Sudden onset of difficulty speaking or slurred speech:    Temporary loss of vision in one eye:     Problems with dizziness:         Gastrointestinal    Blood in stool:     Vomited blood:         Genitourinary    Burning when urinating:     Blood in urine:        Psychiatric    Major depression:         Hematologic    Bleeding problems:    Problems with blood clotting too easily:        Skin    Rashes or ulcers:        Constitutional    Fever or chills:      PHYSICAL EXAM:   Vitals:   08/04/18 1143 08/04/18 1146  BP: 135/78 133/79  Pulse: (!) 52 (!) 52  Resp: 18   Temp: (!) 97.1 F (36.2 C)   TempSrc: Oral   SpO2: 98%   Weight: 177 lb (80.3 kg)   Height: 5\' 10"  (1.778 m)     GENERAL: The patient is a well-nourished male, in no acute distress. The vital signs are documented above. CARDIAC: There is a regular rate and rhythm.  VASCULAR: No carotid bruits.  Palpable pedal pulses. PULMONARY: Non-labored respirations MUSCULOSKELETAL: There are no major deformities or cyanosis. NEUROLOGIC: No focal weakness or paresthesias are detected. SKIN: There are no ulcers or rashes noted. PSYCHIATRIC: The patient has a normal affect.  STUDIES:   I have reviewed his vascular lab studies with the following findings:  +-------+-----------+-----------+------------+------------+ ABI/TBIToday's ABIToday's TBIPrevious ABIPrevious TBI +-------+-----------+-----------+------------+------------+ Right 1.12    0.78    1.0     0.79     +-------+-----------+-----------+------------+------------+ Left  1.17    0.82    1.03    0.82      +-------+-----------+-----------+------------+------------+  Right SFA stent is widely patent  Right Carotid: Velocities in the right ICA are consistent with a 1-39% stenosis.  Left Carotid: Velocities in the left ICA are consistent with a 1-39% stenosis.   MEDICAL ISSUES:  Carotid: Widely patent endarterectomy site, will have follow-up ultrasound in 6 months PAD: Normal ankle-brachial indices bilaterally.  Follow-up duplex of stent and ABIs in 6 months.  Again discussed the importance of smoking cessation.    Annamarie Major, MD [facility] [phone] Pager [phone]

## 2018-08-14 DIAGNOSIS — Z23 Encounter for immunization: Secondary | ICD-10-CM | POA: Diagnosis not present

## 2018-08-19 DIAGNOSIS — M48062 Spinal stenosis, lumbar region with neurogenic claudication: Secondary | ICD-10-CM | POA: Diagnosis not present

## 2018-08-26 ENCOUNTER — Other Ambulatory Visit: Payer: Self-pay | Admitting: Neurosurgery

## 2018-09-02 DIAGNOSIS — Z6825 Body mass index (BMI) 25.0-25.9, adult: Secondary | ICD-10-CM | POA: Diagnosis not present

## 2018-09-02 DIAGNOSIS — I4891 Unspecified atrial fibrillation: Secondary | ICD-10-CM | POA: Diagnosis not present

## 2018-09-02 NOTE — Pre-Procedure Instructions (Signed)
PRANEETH BUSSEY  09/02/2018      [facility], [place] [address] Phone: [phone] Fax: [place], [place] [address] AT [facility] [address] [place] [address] Phone: [phone] Fax: [phone]    Your procedure is scheduled on September 09, 2018.  Report to [facility] Admitting at 1020 AM.  Call this number if you have problems the morning of surgery:  [phone]   Remember:  Do not eat or drink after midnight.   Take these medicines the morning of surgery with A SIP OF WATER  Amiodarone (pacerone) Amlodipine (norvasc) Refresh eye drops-if needed Hydrocodone-acetaminophen (Norco)-if needed for pain  Follow your surgeon's instructions on when to hold/resume Eliquis.  If no instructions were given call the office to determine how they would like to you take Eliquis  7 days prior to surgery STOP taking any Aspirin (unless otherwise instructed by your surgeon), Aleve, Naproxen, Ibuprofen, Motrin, Advil, Goody's, BC's, all herbal medications, fish oil, and all vitamins    Do not wear jewelry  Do not wear lotions, powders, or colognes, or deodorant.  Men may shave face and neck.  Do not bring valuables to the hospital.  [facility] is not responsible for any belongings or valuables.  Contacts, dentures or bridgework may not be worn into surgery.  Leave your suitcase in the car.  After surgery it may be brought to your room.  For patients admitted to the hospital, discharge time will be determined by your treatment team.  Patients discharged the day of surgery will not be allowed to drive home.    [place]- Preparing For Surgery  Before surgery, you can play an important role. Because skin is not sterile, your skin needs to be as free of germs as possible. You can reduce the number of  germs on your skin by washing with CHG (chlorahexidine gluconate) Soap before surgery.  CHG is an antiseptic cleaner which kills germs and bonds with the skin to continue killing germs even after washing.    Oral Hygiene is also important to reduce your risk of infection.  Remember - BRUSH YOUR TEETH THE MORNING OF SURGERY WITH YOUR REGULAR TOOTHPASTE  Please do not use if you have an allergy to CHG or antibacterial soaps. If your skin becomes reddened/irritated stop using the CHG.  Do not shave (including legs and underarms) for at least 48 hours prior to first CHG shower. It is OK to shave your face.  Please follow these instructions carefully.   1. Shower the NIGHT BEFORE SURGERY and the MORNING OF SURGERY with CHG.   2. If you chose to wash your hair, wash your hair first as usual with your normal shampoo.  3. After you shampoo, rinse your hair and body thoroughly to remove the shampoo.  4. Use CHG as you would any other liquid soap. You can apply CHG directly to the skin and wash gently with a scrungie or a clean washcloth.   5. Apply the CHG Soap to your body ONLY FROM THE NECK DOWN.  Do not use on open wounds or open sores. Avoid contact with your eyes, ears, mouth and genitals (private parts). Wash Face and genitals (private parts)  with your normal soap.  6. Wash thoroughly, paying special attention to the area where your surgery  will be performed.  7. Thoroughly rinse your body with warm water from the neck down.  8. DO NOT shower/wash with your normal soap after using and rinsing off the CHG Soap.  9. Pat yourself dry with a CLEAN TOWEL.  10. Wear CLEAN PAJAMAS to bed the night before surgery, wear comfortable clothes the morning of surgery  11. Place CLEAN SHEETS on your bed the night of your first shower and DO NOT SLEEP WITH PETS.  Day of Surgery:  Do not apply any deodorants/lotions.  Please wear clean clothes to the hospital/surgery center.   Remember to brush your  teeth WITH YOUR REGULAR TOOTHPASTE.  Please read over the following fact sheets that you were given.

## 2018-09-03 ENCOUNTER — Encounter (HOSPITAL_COMMUNITY): Payer: Self-pay

## 2018-09-03 ENCOUNTER — Encounter (HOSPITAL_COMMUNITY)
Admission: RE | Admit: 2018-09-03 | Discharge: 2018-09-03 | Disposition: A | Discharge status: Home / Self Care | Payer: Medicare Other | Source: Ambulatory Visit | Attending: Neurosurgery | Admitting: Neurosurgery

## 2018-09-03 ENCOUNTER — Other Ambulatory Visit: Payer: Self-pay

## 2018-09-03 DIAGNOSIS — F17200 Nicotine dependence, unspecified, uncomplicated: Secondary | ICD-10-CM | POA: Diagnosis not present

## 2018-09-03 DIAGNOSIS — Z01812 Encounter for preprocedural laboratory examination: Secondary | ICD-10-CM | POA: Diagnosis not present

## 2018-09-03 DIAGNOSIS — M48061 Spinal stenosis, lumbar region without neurogenic claudication: Secondary | ICD-10-CM | POA: Insufficient documentation

## 2018-09-03 DIAGNOSIS — I10 Essential (primary) hypertension: Secondary | ICD-10-CM | POA: Diagnosis not present

## 2018-09-03 DIAGNOSIS — E1165 Type 2 diabetes mellitus with hyperglycemia: Secondary | ICD-10-CM | POA: Diagnosis not present

## 2018-09-03 DIAGNOSIS — Z7984 Long term (current) use of oral hypoglycemic drugs: Secondary | ICD-10-CM | POA: Diagnosis not present

## 2018-09-03 HISTORY — DX: Prediabetes: R73.03

## 2018-09-03 LAB — BASIC METABOLIC PANEL
Anion gap: 13 (ref 5–15)
BUN: 21 mg/dL (ref 8–23)
CO2: 24 mmol/L (ref 22–32)
CREATININE: 1.26 mg/dL — AB (ref 0.61–1.24)
Calcium: 9.3 mg/dL (ref 8.9–10.3)
Chloride: 98 mmol/L (ref 98–111)
GFR calc Af Amer: >60 mL/min (ref >60)
GFR, EST NON AFRICAN AMERICAN: 59 mL/min — AB (ref >60)
GLUCOSE: 411 mg/dL — AB (ref 70–99)
Potassium: 3.3 mmol/L — ABNORMAL LOW (ref 3.5–5.1)
SODIUM: 135 mmol/L (ref 135–145)

## 2018-09-03 LAB — SURGICAL PCR SCREEN
MRSA, PCR: NEGATIVE
Staphylococcus aureus: NEGATIVE

## 2018-09-03 LAB — CBC WITH DIFFERENTIAL/PLATELET
Abs Immature Granulocytes: 0.02 10*3/uL (ref 0.00–0.07)
Basophils Absolute: 0 10*3/uL (ref 0.0–0.1)
Basophils Relative: 0 %
EOS ABS: 0.1 10*3/uL (ref 0.0–0.5)
Eosinophils Relative: 1 %
HEMATOCRIT: 48.5 % (ref 39.0–52.0)
HEMOGLOBIN: 16.3 g/dL (ref 13.0–17.0)
IMMATURE GRANULOCYTES: 0 %
Lymphocytes Relative: 25 %
Lymphs Abs: 2 10*3/uL (ref 0.7–4.0)
MCH: 29.3 pg (ref 26.0–34.0)
MCHC: 33.6 g/dL (ref 30.0–36.0)
MCV: 87.2 fL (ref 80.0–100.0)
Monocytes Absolute: 0.6 10*3/uL (ref 0.1–1.0)
Monocytes Relative: 7 %
NEUTROS ABS: 5.4 10*3/uL (ref 1.7–7.7)
Neutrophils Relative %: 67 %
Platelets: 321 10*3/uL (ref 150–400)
RBC: 5.56 MIL/uL (ref 4.22–5.81)
RDW: 11.9 % (ref 11.5–15.5)
WBC: 8.1 10*3/uL (ref 4.0–10.5)
nRBC: 0 % (ref 0.0–0.2)

## 2018-09-03 LAB — PROTIME-INR
INR: 1.21
Prothrombin Time: 15.2 seconds (ref 11.4–15.2)

## 2018-09-03 NOTE — Progress Notes (Signed)
Spoke to Dr. Fransisco Beau regarding BMET glucose of 411. Dr. Fransisco Beau advised patient needed to got to ER or if he refuses ER to call PCP or Urgent Care in the morning. I contacted patient. Patient states he had ate ~ 1.5 hours prior to lab work. Patient states he has has steroid shots and was started on metformin but then ran out and has restarted this about 8 days ago.   Patient states he had an A1C of 7.0 about 2 months ago at [facility] with Dr. Kenton Kingfisher. Patient states he is in [place] at the hospital visiting his new granddaughter and does not have his glucose meter with him. I recommended he go to ER at hospital he is at. He states if he decides not to go to ER he will call PCP in the morning. Per Dr. Fransisco Beau I advised patient that if his blood sugar was not controlled day of surgery he would have to be cancelled for safety.

## 2018-09-03 NOTE — Progress Notes (Addendum)
PCP: Shirline Frees, MD  Cardiologist: Mertie Moores, MD  EKG: 03/07/18 in EPIC  Stress test: 06/2016 in EPIC  ECHO: 10/2016 in EPIC  Cardiac Cath: 07/2016 in EPIC  Chest x-ray: pt denies past year, no recent respiratory infections/complications  Patient is on Eliquis advised to begin holding 09/03/18

## 2018-09-04 LAB — HEMOGLOBIN A1C
Hgb A1c MFr Bld: 11.8 % — ABNORMAL HIGH (ref 4.8–5.6)
Mean Plasma Glucose: 291.96 mg/dL

## 2018-09-04 NOTE — Progress Notes (Signed)
Anesthesia Chart Review:  Case:  [id number] Date/Time:  09/09/18 1207   Procedure:  Laminectomy and Foraminotomy bilateral L3-L4 - L4-L5 (Bilateral Back)   Anesthesia type:  General   Pre-op diagnosis:  Stenosis   [facility]:  [facility] OR ROOM 61 / [facility] OR   Surgeon:  Earnie Larsson, MD      DISCUSSION: 67 yo male current smoker. Pertinent hx includes CAD (remote hx of MI and stent to distal RCA; stent patent by cath 2017; chronic total occlusion of left cx with R>L collaterals), HLD, HTN, Afib, DMII, PVD (status post right superficial femoral artery stenting in May 2018), Stroke (11/2017) Carotid disease (s/p Right CEA 11/2017)  Pt follows with vascular surgery for PVD, s/p right CEA 11/2017 and Right SFA stent 01/2017. Seen by Dr. [name] 08/04/2018, per his note: "Widely patent endarterectomy site, will have follow-up ultrasound in 6 months. PAD: Normal ankle-brachial indices bilaterally.  Follow-up duplex of stent and ABIs in 6 months."  Follows with cardiology at [facility], Dr. Stark Klein, s/p RF ablation for Afib 07/28/2018. Per last OV note in care everywhere 09/02/18: "EKG today shows normal sinus bradycardia, HR 58 bpm, QTc<500."  TEE 07/26/2018 in care everywhere shows normal LV systolic function with EF >55%, mild MR, aortic sclerosis.  On PAT labs pt noted to have hyperglycemia on BMP, glucose 411. A1c add on today resulted 11.8. Pt was contacted by PAT nurse to alert him to the results, he is currently out of town visiting family, does not have his glucose meter with him. He was advised to be seen locally by ED or UC for eval. I spoke with pt today and he said he did get seen at the ED and DKA ruled out, BG level was improved. He still does not have a meter to check his sugar. He understands that poorly controlled BG may preclude surgery. Dr. Marchelle Folks office made aware. I also forwarded results to pt's PCP and advised the pt to call their office for input as he is out of town and not in an ideal situation to manage  his BG.  Received a call back 09/05/18 from [place] at Dr. Marchelle Folks office. She spoke with the patient today and he reported that he had been in contact with his PCP and was sent a new rx for higher dose metformin. He is also on his way home from [place] to get his BG meter. She stated that the case was discussed with Dr. Annette Stable and he plans to move forward as planned with the understanding that uncontrolled BG on DOS could be cause for cancellation. Will order BG check on DOS and diabetes coordinator consult.    VS: BP (!) 117/59   Pulse 61   Temp 36.6 C   Resp 20   Ht 5\' 10"  (1.778 m)   Wt 80.6 kg   SpO2 95%   BMI 25.51 kg/m   PROVIDERS: Shirline Frees, MD is PCP   LABS: See above discussion of hyperglycemia (all labs ordered are listed, but only abnormal results are displayed)  Labs Reviewed  BASIC METABOLIC PANEL - Abnormal; Notable for the following components:      Result Value   Potassium 3.3 (*)    Glucose, Bld 411 (*)    Creatinine, Ser 1.26 (*)    GFR calc non Af Amer 59 (*)    All other components within normal limits  SURGICAL PCR SCREEN  CBC WITH DIFFERENTIAL/PLATELET  PROTIME-INR     EKG: 07/30/2018 (care everywhere, narrative  only, s/p ablation): SINUS BRADY WITH SINUS ARRHYTHMIA. RATE 59.Marland KitchenLEFT AXIS DEVIATION. INCOMPLETE RIGHT BUNDLE BRANCH BLOCK. MODERATE VOLTAGE CRITERIA FOR LVH, MAY BE NORMAL VARIANT. ST & MARKED T WAVE ABNORMALITY, CONSIDER ANTEROLATERAL ISCHEMIA. PROLONGED QT. QTc 506.  WHEN COMPARED WITH ECG OF 28-Jul-2018 07:45, SINUS RHYTHM HAS REPLACED ATRIAL FIBRILLATION QT HAS LENGTHENED  03/06/2018: Atrial flutter with 4:1 A-V conduction. Left axis deviation. Incomplete right bundle branch block. ST & T wave abnormality, consider inferior ischemia. ST & T wave abnormality,  CV: TEE 07/26/2018 (care everywhere): No evidence for left atrial chamber of left atrial appendage thrombus  Most likely no thrombus in the right atrial appendage (see  detail below)  Normal left ventricular systolic function, ejection fraction > 55%  Mitral regurgitation - mild  Dilated left atrium  Aortic sclerosis  Normal right ventricular systolic function  Dilated right atrium - mild  Past Medical History:  Diagnosis Date  . CAD (coronary artery disease)   . Cerebrovascular disease   . HLD (hyperlipidemia)   . HTN (hypertension)   . IBS (irritable bowel syndrome)   . MI, old   . Persistent atrial fibrillation   . Pre-diabetes   . PVD (peripheral vascular disease) ([place])   . Stroke (cerebrum) ([place])   . Tobacco abuse     Past Surgical History:  Procedure Laterality Date  . ABDOMINAL AORTOGRAM W/LOWER EXTREMITY N/A 01/25/2017   Procedure: Abdominal Aortogram w/Lower Extremity;  Surgeon: Elam Dutch, MD;  Location: [place] CV LAB;  Service: Cardiovascular;  Laterality: N/A;  . ANTERIOR CRUCIATE LIGAMENT REPAIR    . CARDIAC CATHETERIZATION     [place]  . CARDIAC CATHETERIZATION N/A 07/27/2016   Procedure: Left Heart Cath and Coronary Angiography;  Surgeon: Sherren Mocha, MD;  Location: [place] CV LAB;  Service: Cardiovascular;  Laterality: N/A;  . CARDIOVERSION N/A 06/29/2016   Procedure: CARDIOVERSION;  Surgeon: Fay Records, MD;  Location: [facility] ENDOSCOPY;  Service: Cardiovascular;  Laterality: N/A;  . CARDIOVERSION N/A 03/05/2018   Procedure: CARDIOVERSION;  Surgeon: Larey Dresser, MD;  Location: [facility] ENDOSCOPY;  Service: Cardiovascular;  Laterality: N/A;  . CHOLECYSTECTOMY    . ENDARTERECTOMY Right 12/06/2017   Procedure: ENDARTERECTOMY CAROTID RIGHT;  Surgeon: Serafina Mitchell, MD;  Location: [place];  Service: Vascular;  Laterality: Right;  . [place], 2000   x 4 times.  Marland Kitchen PATCH ANGIOPLASTY Right 12/06/2017   Procedure: PATCH ANGIOPLASTY RIGHT CAROTID ARTERY;  Surgeon: Serafina Mitchell, MD;  Location: [place];  Service: Vascular;  Laterality: Right;  . PERIPHERAL VASCULAR INTERVENTION Right 01/25/2017    Procedure: Peripheral Vascular Intervention;  Surgeon: Elam Dutch, MD;  Location: [place] CV LAB;  Service: Cardiovascular;  Laterality: Right;  . TONSILLECTOMY      MEDICATIONS: . metFORMIN (GLUCOPHAGE) 500 MG tablet  . amiodarone (PACERONE) 200 MG tablet  . amLODipine (NORVASC) 10 MG tablet  . apixaban (ELIQUIS) 5 MG TABS tablet  . atorvastatin (LIPITOR) 40 MG tablet  . Carboxymethylcellul-Glycerin ([place] OP)  . chlorthalidone (HYGROTON) 25 MG tablet  . fenofibrate (TRICOR) 145 MG tablet  . HYDROcodone-acetaminophen (NORCO/VICODIN) 5-325 MG tablet  . losartan (COZAAR) 100 MG tablet  . nicotine (NICODERM CQ - DOSED IN MG/24 HOURS) 21 mg/24hr patch  . potassium chloride (K-DUR) 10 MEQ tablet  . potassium chloride SA (K-DUR,KLOR-CON) 20 MEQ tablet  . spironolactone (ALDACTONE) 25 MG tablet   No current facility-administered medications for this encounter.      Jeneen Rinks  [facility]s [facility] Short Stay Center/Anesthesiology Phone [phone] 09/05/2018 10:32 AM

## 2018-09-04 NOTE — Progress Notes (Signed)
Patient called back. He did visit the ED at [facility] in [place] last night after speaking with [place]. Patient is to request records and have them sent to both Dr. Marchelle Folks office and to [place].

## 2018-09-05 NOTE — Anesthesia Preprocedure Evaluation (Addendum)
Anesthesia Evaluation  Patient identified by MRN, date of birth, ID band Patient awake    Reviewed: Allergy & Precautions, NPO status , Patient's Chart, lab work & pertinent test results  History of Anesthesia Complications (+) PONV  Airway Mallampati: II  TM Distance: >3 FB     Dental   Pulmonary Current Smoker,    breath sounds clear to auscultation       Cardiovascular hypertension, + CAD, + Past MI and + Peripheral Vascular Disease   Rhythm:Regular Rate:Normal     Neuro/Psych    GI/Hepatic Neg liver ROS, GERD  ,  Endo/Other    Renal/GU negative Renal ROS     Musculoskeletal   Abdominal   Peds  Hematology   Anesthesia Other Findings   Reproductive/Obstetrics                           Anesthesia Physical Anesthesia Plan  ASA: III  Anesthesia Plan: General   Post-op Pain Management:    Induction: Intravenous  PONV Risk Score and Plan: Ondansetron, Dexamethasone and Midazolam  Airway Management Planned: Oral ETT  Additional Equipment:   Intra-op Plan:   Post-operative Plan: Possible Post-op intubation/ventilation  Informed Consent: I have reviewed the patients History and Physical, chart, labs and discussed the procedure including the risks, benefits and alternatives for the proposed anesthesia with the patient or authorized representative who has indicated his/her understanding and acceptance.   Dental advisory given  Plan Discussed with: CRNA, Anesthesiologist and Surgeon  Anesthesia Plan Comments: (See PAT note 09/03/2018 by Karoline Caldwell, PA-C )      Anesthesia Quick Evaluation

## 2018-09-09 ENCOUNTER — Inpatient Hospital Stay (HOSPITAL_COMMUNITY): Payer: Medicare Other

## 2018-09-09 ENCOUNTER — Inpatient Hospital Stay (HOSPITAL_COMMUNITY)
Admission: RE | Admit: 2018-09-09 | Discharge: 2018-09-10 | DRG: 519 | Disposition: A | Discharge status: Home / Self Care | Payer: Medicare Other | Attending: Neurosurgery | Admitting: Neurosurgery

## 2018-09-09 ENCOUNTER — Inpatient Hospital Stay (HOSPITAL_COMMUNITY): Payer: Medicare Other | Admitting: Physician Assistant

## 2018-09-09 ENCOUNTER — Encounter (HOSPITAL_COMMUNITY)
Admission: RE | Disposition: A | Discharge status: Home / Self Care | Payer: Self-pay | Source: Home / Self Care | Attending: Neurosurgery

## 2018-09-09 ENCOUNTER — Encounter (HOSPITAL_COMMUNITY): Payer: Self-pay | Admitting: Certified Registered Nurse Anesthetist

## 2018-09-09 ENCOUNTER — Other Ambulatory Visit: Payer: Self-pay

## 2018-09-09 DIAGNOSIS — Z79891 Long term (current) use of opiate analgesic: Secondary | ICD-10-CM

## 2018-09-09 DIAGNOSIS — Z8249 Family history of ischemic heart disease and other diseases of the circulatory system: Secondary | ICD-10-CM

## 2018-09-09 DIAGNOSIS — Z419 Encounter for procedure for purposes other than remedying health state, unspecified: Secondary | ICD-10-CM

## 2018-09-09 DIAGNOSIS — Z79899 Other long term (current) drug therapy: Secondary | ICD-10-CM

## 2018-09-09 DIAGNOSIS — E1151 Type 2 diabetes mellitus with diabetic peripheral angiopathy without gangrene: Secondary | ICD-10-CM | POA: Diagnosis present

## 2018-09-09 DIAGNOSIS — I4819 Other persistent atrial fibrillation: Secondary | ICD-10-CM | POA: Diagnosis not present

## 2018-09-09 DIAGNOSIS — M47816 Spondylosis without myelopathy or radiculopathy, lumbar region: Secondary | ICD-10-CM | POA: Diagnosis not present

## 2018-09-09 DIAGNOSIS — F17210 Nicotine dependence, cigarettes, uncomplicated: Secondary | ICD-10-CM | POA: Diagnosis present

## 2018-09-09 DIAGNOSIS — Z7984 Long term (current) use of oral hypoglycemic drugs: Secondary | ICD-10-CM | POA: Diagnosis not present

## 2018-09-09 DIAGNOSIS — I252 Old myocardial infarction: Secondary | ICD-10-CM | POA: Diagnosis not present

## 2018-09-09 DIAGNOSIS — Z9861 Coronary angioplasty status: Secondary | ICD-10-CM

## 2018-09-09 DIAGNOSIS — I739 Peripheral vascular disease, unspecified: Secondary | ICD-10-CM | POA: Diagnosis not present

## 2018-09-09 DIAGNOSIS — Z8043 Family history of malignant neoplasm of testis: Secondary | ICD-10-CM | POA: Diagnosis not present

## 2018-09-09 DIAGNOSIS — I2510 Atherosclerotic heart disease of native coronary artery without angina pectoris: Secondary | ICD-10-CM | POA: Diagnosis not present

## 2018-09-09 DIAGNOSIS — Z7901 Long term (current) use of anticoagulants: Secondary | ICD-10-CM | POA: Diagnosis not present

## 2018-09-09 DIAGNOSIS — Z8673 Personal history of transient ischemic attack (TIA), and cerebral infarction without residual deficits: Secondary | ICD-10-CM | POA: Diagnosis not present

## 2018-09-09 DIAGNOSIS — E785 Hyperlipidemia, unspecified: Secondary | ICD-10-CM | POA: Diagnosis present

## 2018-09-09 DIAGNOSIS — I10 Essential (primary) hypertension: Secondary | ICD-10-CM | POA: Diagnosis present

## 2018-09-09 DIAGNOSIS — M5136 Other intervertebral disc degeneration, lumbar region: Secondary | ICD-10-CM | POA: Diagnosis not present

## 2018-09-09 DIAGNOSIS — K219 Gastro-esophageal reflux disease without esophagitis: Secondary | ICD-10-CM | POA: Diagnosis present

## 2018-09-09 DIAGNOSIS — M48062 Spinal stenosis, lumbar region with neurogenic claudication: Principal | ICD-10-CM | POA: Diagnosis present

## 2018-09-09 DIAGNOSIS — M48061 Spinal stenosis, lumbar region without neurogenic claudication: Secondary | ICD-10-CM | POA: Diagnosis not present

## 2018-09-09 HISTORY — DX: Spinal stenosis, lumbar region without neurogenic claudication: M48.061

## 2018-09-09 HISTORY — DX: Other specified postprocedural states: Z98.890

## 2018-09-09 HISTORY — PX: LUMBAR LAMINECTOMY/DECOMPRESSION MICRODISCECTOMY: SHX5026

## 2018-09-09 HISTORY — DX: Nausea with vomiting, unspecified: R11.2

## 2018-09-09 LAB — GLUCOSE, CAPILLARY
GLUCOSE-CAPILLARY: 402 mg/dL — AB (ref 70–99)
Glucose-Capillary: 192 mg/dL — ABNORMAL HIGH (ref 70–99)
Glucose-Capillary: 188 mg/dL — ABNORMAL HIGH (ref 70–99)
Glucose-Capillary: 217 mg/dL — ABNORMAL HIGH (ref 70–99)
Glucose-Capillary: 302 mg/dL — ABNORMAL HIGH (ref 70–99)
Glucose-Capillary: 444 mg/dL — ABNORMAL HIGH (ref 70–99)
Glucose-Capillary: 433 mg/dL — ABNORMAL HIGH (ref 70–99)

## 2018-09-09 LAB — PROTIME-INR
INR: 1.03
Prothrombin Time: 13.4 seconds (ref 11.4–15.2)

## 2018-09-09 SURGERY — LUMBAR LAMINECTOMY/DECOMPRESSION MICRODISCECTOMY 2 LEVELS
Anesthesia: General | Site: Back | Laterality: Bilateral

## 2018-09-09 MED ORDER — HYDROCODONE-ACETAMINOPHEN 10-325 MG PO TABS: 2.0000 | ORAL_TABLET | ORAL | Status: DC | PRN

## 2018-09-09 MED ORDER — KETOROLAC TROMETHAMINE 30 MG/ML IJ SOLN
INTRAMUSCULAR | Status: AC
  Filled 2018-09-09: qty 1

## 2018-09-09 MED ORDER — CEFAZOLIN SODIUM-DEXTROSE 1-4 GM/50ML-% IV SOLN
1.0000 g | Freq: Three times a day (TID) | INTRAVENOUS | Status: AC
  Administered 2018-09-09 – 2018-09-10 (×2): 1 g via INTRAVENOUS
  Filled 2018-09-09 (×2): qty 50

## 2018-09-09 MED ORDER — AMLODIPINE BESYLATE 5 MG PO TABS: 10.0000 mg | ORAL_TABLET | Freq: Every day | ORAL | Status: DC

## 2018-09-09 MED ORDER — THROMBIN 5000 UNITS EX SOLR
CUTANEOUS | Status: DC | PRN
  Administered 2018-09-09 (×2): 5000 [IU] via TOPICAL

## 2018-09-09 MED ORDER — METFORMIN HCL 500 MG PO TABS: 500.0000 mg | ORAL_TABLET | Freq: Every day | ORAL | Status: DC

## 2018-09-09 MED ORDER — LOSARTAN POTASSIUM 50 MG PO TABS: 100.0000 mg | ORAL_TABLET | Freq: Every day | ORAL | Status: DC

## 2018-09-09 MED ORDER — POTASSIUM CHLORIDE CRYS ER 20 MEQ PO TBCR: 20.0000 meq | EXTENDED_RELEASE_TABLET | ORAL | Status: DC

## 2018-09-09 MED ORDER — MIDAZOLAM HCL 2 MG/2ML IJ SOLN
INTRAMUSCULAR | Status: AC
  Filled 2018-09-09: qty 2

## 2018-09-09 MED ORDER — ONDANSETRON HCL 4 MG/2ML IJ SOLN
INTRAMUSCULAR | Status: DC | PRN
  Administered 2018-09-09: 4 mg via INTRAVENOUS

## 2018-09-09 MED ORDER — PROPOFOL 10 MG/ML IV BOLUS
INTRAVENOUS | Status: DC | PRN
  Administered 2018-09-09: 160 mg via INTRAVENOUS

## 2018-09-09 MED ORDER — EPHEDRINE SULFATE-NACL 50-0.9 MG/10ML-% IV SOSY
PREFILLED_SYRINGE | INTRAVENOUS | Status: DC | PRN
  Administered 2018-09-09 (×2): 5 mg via INTRAVENOUS
  Administered 2018-09-09: 10 mg via INTRAVENOUS
  Administered 2018-09-09: 5 mg via INTRAVENOUS
  Administered 2018-09-09: 10 mg via INTRAVENOUS

## 2018-09-09 MED ORDER — POLYVINYL ALCOHOL 1.4 % OP SOLN
1.0000 [drp] | Freq: Every day | OPHTHALMIC | Status: DC
  Filled 2018-09-09: qty 15

## 2018-09-09 MED ORDER — NICOTINE 21 MG/24HR TD PT24
21.0000 mg | MEDICATED_PATCH | TRANSDERMAL | Status: DC
  Filled 2018-09-09: qty 1

## 2018-09-09 MED ORDER — CHLORTHALIDONE 25 MG PO TABS
25.0000 mg | ORAL_TABLET | Freq: Every day | ORAL | Status: DC
  Filled 2018-09-09: qty 1

## 2018-09-09 MED ORDER — CARBOXYMETHYLCELLUL-GLYCERIN 0.5-0.9 % OP SOLN
1.0000 [drp] | Freq: Every morning | OPHTHALMIC | Status: DC
  Filled 2018-09-09 (×2): qty 15

## 2018-09-09 MED ORDER — FENTANYL CITRATE (PF) 100 MCG/2ML IJ SOLN
25.0000 ug | INTRAMUSCULAR | Status: DC | PRN
  Administered 2018-09-09 (×3): 25 ug via INTRAVENOUS

## 2018-09-09 MED ORDER — FENTANYL CITRATE (PF) 250 MCG/5ML IJ SOLN
INTRAMUSCULAR | Status: AC
  Filled 2018-09-09: qty 5

## 2018-09-09 MED ORDER — KETOROLAC TROMETHAMINE 15 MG/ML IJ SOLN
15.0000 mg | Freq: Four times a day (QID) | INTRAMUSCULAR | Status: DC
  Administered 2018-09-09 – 2018-09-10 (×2): 15 mg via INTRAVENOUS
  Filled 2018-09-09 (×2): qty 1

## 2018-09-09 MED ORDER — ONDANSETRON HCL 4 MG/2ML IJ SOLN
INTRAMUSCULAR | Status: AC
  Filled 2018-09-09: qty 2

## 2018-09-09 MED ORDER — DEXAMETHASONE SODIUM PHOSPHATE 10 MG/ML IJ SOLN
10.0000 mg | INTRAMUSCULAR | Status: AC
  Administered 2018-09-09: 10 mg via INTRAVENOUS
  Filled 2018-09-09: qty 1

## 2018-09-09 MED ORDER — EPHEDRINE 5 MG/ML INJ
INTRAVENOUS | Status: AC
  Filled 2018-09-09: qty 10

## 2018-09-09 MED ORDER — ONDANSETRON HCL 4 MG PO TABS: 4.0000 mg | ORAL_TABLET | Freq: Four times a day (QID) | ORAL | Status: DC | PRN

## 2018-09-09 MED ORDER — KETOROLAC TROMETHAMINE 30 MG/ML IJ SOLN
INTRAMUSCULAR | Status: DC | PRN
  Administered 2018-09-09: 30 mg via INTRAVENOUS

## 2018-09-09 MED ORDER — SODIUM CHLORIDE 0.9% FLUSH: 3.0000 mL | INTRAVENOUS | Status: DC | PRN

## 2018-09-09 MED ORDER — INSULIN ASPART 100 UNIT/ML ~~LOC~~ SOLN
0.0000 [IU] | Freq: Three times a day (TID) | SUBCUTANEOUS | Status: DC
  Administered 2018-09-10: 11 [IU] via SUBCUTANEOUS

## 2018-09-09 MED ORDER — FENTANYL CITRATE (PF) 250 MCG/5ML IJ SOLN
INTRAMUSCULAR | Status: DC | PRN
  Administered 2018-09-09 (×2): 50 ug via INTRAVENOUS
  Administered 2018-09-09: 100 ug via INTRAVENOUS
  Administered 2018-09-09 (×3): 50 ug via INTRAVENOUS

## 2018-09-09 MED ORDER — THROMBIN 5000 UNITS EX SOLR
CUTANEOUS | Status: AC
  Filled 2018-09-09: qty 15000

## 2018-09-09 MED ORDER — HYDROMORPHONE HCL 1 MG/ML IJ SOLN: 1.0000 mg | INTRAMUSCULAR | Status: DC | PRN

## 2018-09-09 MED ORDER — ESMOLOL HCL 100 MG/10ML IV SOLN
INTRAVENOUS | Status: AC
  Filled 2018-09-09: qty 10

## 2018-09-09 MED ORDER — SUGAMMADEX SODIUM 200 MG/2ML IV SOLN
INTRAVENOUS | Status: DC | PRN
  Administered 2018-09-09 (×2): 100 mg via INTRAVENOUS

## 2018-09-09 MED ORDER — ONDANSETRON HCL 4 MG/2ML IJ SOLN: 4.0000 mg | Freq: Four times a day (QID) | INTRAMUSCULAR | Status: DC | PRN

## 2018-09-09 MED ORDER — CHLORHEXIDINE GLUCONATE CLOTH 2 % EX PADS: 6.0000 | MEDICATED_PAD | Freq: Once | CUTANEOUS | Status: DC

## 2018-09-09 MED ORDER — ATORVASTATIN CALCIUM 40 MG PO TABS: 40.0000 mg | ORAL_TABLET | Freq: Every day | ORAL | Status: DC

## 2018-09-09 MED ORDER — POTASSIUM CHLORIDE ER 10 MEQ PO TBCR
10.0000 meq | EXTENDED_RELEASE_TABLET | ORAL | Status: DC
  Filled 2018-09-09: qty 1

## 2018-09-09 MED ORDER — ONDANSETRON HCL 4 MG PO TABS: 4.0000 mg | ORAL_TABLET | Freq: Three times a day (TID) | ORAL | Status: DC | PRN

## 2018-09-09 MED ORDER — MIDAZOLAM HCL 2 MG/2ML IJ SOLN
INTRAMUSCULAR | Status: DC | PRN
  Administered 2018-09-09: 2 mg via INTRAVENOUS

## 2018-09-09 MED ORDER — INSULIN ASPART 100 UNIT/ML ~~LOC~~ SOLN: 0.0000 [IU] | Freq: Three times a day (TID) | SUBCUTANEOUS | Status: DC

## 2018-09-09 MED ORDER — 0.9 % SODIUM CHLORIDE (POUR BTL) OPTIME
TOPICAL | Status: DC | PRN
  Administered 2018-09-09: 1000 mL

## 2018-09-09 MED ORDER — SODIUM CHLORIDE 0.9 % IV SOLN: 250.0000 mL | INTRAVENOUS | Status: DC

## 2018-09-09 MED ORDER — KETOROLAC TROMETHAMINE 15 MG/ML IJ SOLN
30.0000 mg | Freq: Four times a day (QID) | INTRAMUSCULAR | Status: DC
  Administered 2018-09-09: 30 mg via INTRAVENOUS
  Filled 2018-09-09: qty 2

## 2018-09-09 MED ORDER — ACETAMINOPHEN 325 MG PO TABS: 650.0000 mg | ORAL_TABLET | ORAL | Status: DC | PRN

## 2018-09-09 MED ORDER — LACTATED RINGERS IV SOLN
INTRAVENOUS | Status: DC
  Administered 2018-09-09 (×2): via INTRAVENOUS

## 2018-09-09 MED ORDER — ROCURONIUM BROMIDE 10 MG/ML (PF) SYRINGE
PREFILLED_SYRINGE | INTRAVENOUS | Status: DC | PRN
  Administered 2018-09-09: 20 mg via INTRAVENOUS
  Administered 2018-09-09: 50 mg via INTRAVENOUS

## 2018-09-09 MED ORDER — SODIUM CHLORIDE 0.9% FLUSH
3.0000 mL | Freq: Two times a day (BID) | INTRAVENOUS | Status: DC
  Administered 2018-09-09: 3 mL via INTRAVENOUS

## 2018-09-09 MED ORDER — MENTHOL 3 MG MT LOZG: 1.0000 | LOZENGE | OROMUCOSAL | Status: DC | PRN

## 2018-09-09 MED ORDER — ESMOLOL HCL 100 MG/10ML IV SOLN
INTRAVENOUS | Status: DC | PRN
  Administered 2018-09-09 (×2): 30 mg via INTRAVENOUS

## 2018-09-09 MED ORDER — INSULIN ASPART 100 UNIT/ML ~~LOC~~ SOLN
0.0000 [IU] | Freq: Every day | SUBCUTANEOUS | Status: DC
  Administered 2018-09-09: 5 [IU] via SUBCUTANEOUS

## 2018-09-09 MED ORDER — HYDROCODONE-ACETAMINOPHEN 5-325 MG PO TABS
1.0000 | ORAL_TABLET | ORAL | Status: DC | PRN
  Administered 2018-09-10: 1 via ORAL
  Filled 2018-09-09: qty 1

## 2018-09-09 MED ORDER — SPIRONOLACTONE 12.5 MG HALF TABLET
12.5000 mg | ORAL_TABLET | Freq: Every day | ORAL | Status: DC
  Filled 2018-09-09: qty 1

## 2018-09-09 MED ORDER — CEFAZOLIN SODIUM-DEXTROSE 2-4 GM/100ML-% IV SOLN
2.0000 g | INTRAVENOUS | Status: AC
  Administered 2018-09-09: 2 g via INTRAVENOUS
  Filled 2018-09-09: qty 100

## 2018-09-09 MED ORDER — FENTANYL CITRATE (PF) 100 MCG/2ML IJ SOLN
INTRAMUSCULAR | Status: AC
  Filled 2018-09-09: qty 2

## 2018-09-09 MED ORDER — BUPIVACAINE HCL (PF) 0.25 % IJ SOLN
INTRAMUSCULAR | Status: DC | PRN
  Administered 2018-09-09: 20 mL

## 2018-09-09 MED ORDER — HEMOSTATIC AGENTS (NO CHARGE) OPTIME
TOPICAL | Status: DC | PRN
  Administered 2018-09-09: 1

## 2018-09-09 MED ORDER — AMIODARONE HCL 200 MG PO TABS
200.0000 mg | ORAL_TABLET | Freq: Every day | ORAL | Status: DC
  Filled 2018-09-09: qty 1

## 2018-09-09 MED ORDER — LIDOCAINE 2% (20 MG/ML) 5 ML SYRINGE
INTRAMUSCULAR | Status: DC | PRN
  Administered 2018-09-09: 80 mg via INTRAVENOUS

## 2018-09-09 MED ORDER — BUPIVACAINE HCL (PF) 0.25 % IJ SOLN
INTRAMUSCULAR | Status: AC
  Filled 2018-09-09: qty 30

## 2018-09-09 MED ORDER — CYCLOBENZAPRINE HCL 10 MG PO TABS
10.0000 mg | ORAL_TABLET | Freq: Three times a day (TID) | ORAL | Status: DC | PRN
  Administered 2018-09-10: 10 mg via ORAL
  Filled 2018-09-09: qty 1

## 2018-09-09 MED ORDER — ACETAMINOPHEN 650 MG RE SUPP: 650.0000 mg | RECTAL | Status: DC | PRN

## 2018-09-09 MED ORDER — SUCCINYLCHOLINE CHLORIDE 200 MG/10ML IV SOSY
PREFILLED_SYRINGE | INTRAVENOUS | Status: DC | PRN
  Administered 2018-09-09: 120 mg via INTRAVENOUS

## 2018-09-09 MED ORDER — SODIUM CHLORIDE 0.9 % IV SOLN
INTRAVENOUS | Status: DC | PRN
  Administered 2018-09-09: 500 mL

## 2018-09-09 MED ORDER — FENOFIBRATE 160 MG PO TABS
145.0000 mg | ORAL_TABLET | Freq: Every day | ORAL | Status: DC
  Filled 2018-09-09: qty 1

## 2018-09-09 MED ORDER — PHENOL 1.4 % MT LIQD: 1.0000 | OROMUCOSAL | Status: DC | PRN

## 2018-09-09 SURGICAL SUPPLY — 52 items
APL SKNCLS STERI-STRIP NONHPOA (GAUZE/BANDAGES/DRESSINGS) ×1
BAG DECANTER FOR FLEXI CONT (MISCELLANEOUS) ×3 IMPLANT
BENZOIN TINCTURE PRP APPL 2/3 (GAUZE/BANDAGES/DRESSINGS) ×3 IMPLANT
BLADE CLIPPER SURG (BLADE) IMPLANT
BUR CUTTER 7.0 ROUND (BURR) ×3 IMPLANT
CANISTER SUCT 3000ML PPV (MISCELLANEOUS) ×3 IMPLANT
CARTRIDGE OIL MAESTRO DRILL (MISCELLANEOUS) ×1 IMPLANT
CLOSURE WOUND 1/2 X4 (GAUZE/BANDAGES/DRESSINGS) ×1
COVER WAND RF STERILE (DRAPES) ×3 IMPLANT
DECANTER SPIKE VIAL GLASS SM (MISCELLANEOUS) ×3 IMPLANT
DERMABOND ADVANCED (GAUZE/BANDAGES/DRESSINGS) ×2
DERMABOND ADVANCED .7 DNX12 (GAUZE/BANDAGES/DRESSINGS) ×1 IMPLANT
DIFFUSER DRILL AIR PNEUMATIC (MISCELLANEOUS) ×3 IMPLANT
DRAPE HALF SHEET 40X57 (DRAPES) ×3 IMPLANT
DRAPE LAPAROTOMY 100X72X124 (DRAPES) ×3 IMPLANT
DRAPE MICROSCOPE LEICA (MISCELLANEOUS) ×3 IMPLANT
DRAPE SURG 17X23 STRL (DRAPES) ×6 IMPLANT
DRSG OPSITE POSTOP 3X4 (GAUZE/BANDAGES/DRESSINGS) ×3 IMPLANT
DURAPREP 26ML APPLICATOR (WOUND CARE) ×3 IMPLANT
ELECT REM PT RETURN 9FT ADLT (ELECTROSURGICAL) ×3
ELECTRODE REM PT RTRN 9FT ADLT (ELECTROSURGICAL) ×1 IMPLANT
GAUZE 4X4 16PLY RFD (DISPOSABLE) IMPLANT
GAUZE SPONGE 4X4 12PLY STRL (GAUZE/BANDAGES/DRESSINGS) ×3 IMPLANT
GLOVE BIO SURGEON STRL SZ 6.5 (GLOVE) ×8 IMPLANT
GLOVE BIO SURGEONS STRL SZ 6.5 (GLOVE) ×4
GLOVE BIOGEL PI IND STRL 6.5 (GLOVE) ×3 IMPLANT
GLOVE BIOGEL PI IND STRL 7.0 (GLOVE) ×1 IMPLANT
GLOVE BIOGEL PI INDICATOR 6.5 (GLOVE) ×6
GLOVE BIOGEL PI INDICATOR 7.0 (GLOVE) ×2
GLOVE ECLIPSE 9.0 STRL (GLOVE) ×3 IMPLANT
GLOVE EXAM NITRILE XL STR (GLOVE) IMPLANT
GOWN STRL REUS W/ TWL LRG LVL3 (GOWN DISPOSABLE) ×2 IMPLANT
GOWN STRL REUS W/ TWL XL LVL3 (GOWN DISPOSABLE) ×1 IMPLANT
GOWN STRL REUS W/TWL 2XL LVL3 (GOWN DISPOSABLE) IMPLANT
GOWN STRL REUS W/TWL LRG LVL3 (GOWN DISPOSABLE) ×6
GOWN STRL REUS W/TWL XL LVL3 (GOWN DISPOSABLE) ×2
KIT BASIN OR (CUSTOM PROCEDURE TRAY) ×3 IMPLANT
KIT TURNOVER KIT B (KITS) ×3 IMPLANT
NEEDLE HYPO 22GX1.5 SAFETY (NEEDLE) ×3 IMPLANT
NEEDLE SPNL 22GX3.5 QUINCKE BK (NEEDLE) ×3 IMPLANT
NS IRRIG 1000ML POUR BTL (IV SOLUTION) ×3 IMPLANT
OIL CARTRIDGE MAESTRO DRILL (MISCELLANEOUS) ×3
PACK LAMINECTOMY NEURO (CUSTOM PROCEDURE TRAY) ×3 IMPLANT
PAD ARMBOARD 7.5X6 YLW CONV (MISCELLANEOUS) ×9 IMPLANT
RUBBERBAND STERILE (MISCELLANEOUS) ×6 IMPLANT
SPONGE SURGIFOAM ABS GEL SZ50 (HEMOSTASIS) ×3 IMPLANT
STRIP CLOSURE SKIN 1/2X4 (GAUZE/BANDAGES/DRESSINGS) ×2 IMPLANT
SUT VIC AB 2-0 CT1 18 (SUTURE) ×3 IMPLANT
SUT VIC AB 3-0 SH 8-18 (SUTURE) ×3 IMPLANT
TOWEL GREEN STERILE (TOWEL DISPOSABLE) ×3 IMPLANT
TOWEL GREEN STERILE FF (TOWEL DISPOSABLE) ×3 IMPLANT
WATER STERILE IRR 1000ML POUR (IV SOLUTION) ×3 IMPLANT

## 2018-09-09 NOTE — Plan of Care (Signed)
  Problem: Education: Goal: Ability to verbalize activity precautions or restrictions will improve Outcome: Progressing Goal: Knowledge of the prescribed therapeutic regimen will improve Outcome: Progressing Goal: Understanding of discharge needs will improve Outcome: Progressing   Problem: Activity: Goal: Ability to avoid complications of mobility impairment will improve Outcome: Progressing Goal: Ability to tolerate increased activity will improve Outcome: Progressing Goal: Will remain free from falls Outcome: Progressing   Problem: Pain Management: Goal: Pain level will decrease Outcome: Progressing   Problem: Skin Integrity: Goal: Will show signs of wound healing Outcome: Progressing

## 2018-09-09 NOTE — Progress Notes (Signed)
Inpatient Diabetes Program Recommendations  AACE/ADA: New Consensus Statement on Inpatient Glycemic Control (2015)  Target Ranges:  Prepandial:   less than 140 mg/dL      Peak postprandial:   less than 180 mg/dL (1-2 hours)      Critically ill patients:  140 - 180 mg/dL   Lab Results  Component Value Date   GLUCAP 192 (H) 09/09/2018   HGBA1C 11.8 (H) 09/03/2018    Review of Glycemic Control  Diabetes history: DM2 Outpatient Diabetes medications: Metformin 500 mg qd Current orders for Inpatient glycemic control: None  Inpatient Diabetes Program Recommendations:   Received consult regarding elevated A1c 11.8 on 09/03/18. Called periop to speak with RN and current CBG 192. If blood glucose increases during surgery, please start on IV insulin drip. Post-op -Lantus 17 units daily -Novolog moderate correction tid + hs 0-5 Will plan to speak to patient tomorrow concerning elevated A1c.  Thank you, Nani Gasser. , RN, MSN, CDE  Diabetes Coordinator Inpatient Glycemic Control Team Team Pager [phone] (8am-5pm) 09/09/2018 11:03 AM

## 2018-09-09 NOTE — Progress Notes (Signed)
Spoke with [name] at Dr. Candiss Norse office to see if we could get an updated cardiac clearance note.

## 2018-09-09 NOTE — Anesthesia Postprocedure Evaluation (Signed)
Anesthesia Post Note  Patient: Scott [name]  Procedure(s) Performed: Laminectomy and Foraminotomy bilateral Lumbar three-Lumbar four - Lumbar four-Lumbar five (Bilateral Back)     Patient location during evaluation: PACU Anesthesia Type: General Level of consciousness: awake Pain management: pain level controlled Vital Signs Assessment: post-procedure vital signs reviewed and stable Respiratory status: spontaneous breathing Cardiovascular status: stable Postop Assessment: no headache Anesthetic complications: no    Last Vitals:  Vitals:   09/09/18 1651 09/09/18 1709  BP:  140/65  Pulse: 66 66  Resp: 15 16  Temp:  36.5 C  SpO2: 94% 96%    Last Pain:  Vitals:   09/09/18 1709  TempSrc: Oral  PainSc:                   

## 2018-09-09 NOTE — Brief Op Note (Signed)
09/09/2018  3:17 PM  PATIENT:  Scott [name]  67 y.o. male  PRE-OPERATIVE DIAGNOSIS:  Stenosis  POST-OPERATIVE DIAGNOSIS:  Stenosis  PROCEDURE:  Procedure(s): Laminectomy and Foraminotomy bilateral Lumbar three-Lumbar four - Lumbar four-Lumbar five (Bilateral)  SURGEON:  Surgeon(s) and Role:    Earnie Larsson, MD - Primary    * Erline Levine, MD - Assisting  PHYSICIAN ASSISTANT:   ASSISTANTS:    ANESTHESIA:   general  EBL:  100 mL   BLOOD ADMINISTERED:none  DRAINS: none   LOCAL MEDICATIONS USED:  MARCAINE     SPECIMEN:  No Specimen  DISPOSITION OF SPECIMEN:  N/A  COUNTS:  YES  TOURNIQUET:  * No tourniquets in log *  DICTATION: .Dragon Dictation  PLAN OF CARE: Admit to inpatient   PATIENT DISPOSITION:  PACU - hemodynamically stable.   Delay start of Pharmacological VTE agent (>24hrs) due to surgical blood loss or risk of bleeding: yes

## 2018-09-09 NOTE — H&P (Signed)
Scott [name] is an 67 y.o. male.   Chief Complaint: Bilateral lower extremity numbness HPI: 67 year old male with progressive bilateral lower extremity numbness paresthesias and some weakness consistent with neurogenic claudication which is failed conservative management her work-up demonstrates evidence of significant multifactorial stenosis at L3-4 and L4-5.  Patient presents now for decompressive surgery in hopes of improving his symptoms.  Past Medical History:  Diagnosis Date  . CAD (coronary artery disease)   . Cerebrovascular disease   . HLD (hyperlipidemia)   . HTN (hypertension)   . IBS (irritable bowel syndrome)   . Lumbar stenosis   . MI, old   . Persistent atrial fibrillation   . PONV (postoperative nausea and vomiting)   . Pre-diabetes   . PVD (peripheral vascular disease) ([place])   . Stroke (cerebrum) ([place])   . Tobacco abuse     Past Surgical History:  Procedure Laterality Date  . ABDOMINAL AORTOGRAM W/LOWER EXTREMITY N/A 01/25/2017   Procedure: Abdominal Aortogram w/Lower Extremity;  Surgeon: Elam Dutch, MD;  Location: [place] [facility];  Service: Cardiovascular;  Laterality: N/A;  . ANTERIOR CRUCIATE LIGAMENT REPAIR    . CARDIAC CATHETERIZATION     [place]  . CARDIAC CATHETERIZATION N/A 07/27/2016   Procedure: Left Heart Cath and Coronary Angiography;  Surgeon: Sherren Mocha, MD;  Location: [place] [facility];  Service: Cardiovascular;  Laterality: N/A;  . CARDIOVERSION N/A 06/29/2016   Procedure: CARDIOVERSION;  Surgeon: Fay Records, MD;  Location: [facility] ENDOSCOPY;  Service: Cardiovascular;  Laterality: N/A;  . CARDIOVERSION N/A 03/05/2018   Procedure: CARDIOVERSION;  Surgeon: Larey Dresser, MD;  Location: [facility] ENDOSCOPY;  Service: Cardiovascular;  Laterality: N/A;  . CHOLECYSTECTOMY    . ENDARTERECTOMY Right 12/06/2017   Procedure: ENDARTERECTOMY CAROTID RIGHT;  Surgeon: Serafina Mitchell, MD;  Location: [place];  Service: Vascular;  Laterality: Right;  . Rogers, 2000   x 4 times.  Marland Kitchen PATCH ANGIOPLASTY Right 12/06/2017   Procedure: PATCH ANGIOPLASTY RIGHT CAROTID ARTERY;  Surgeon: Serafina Mitchell, MD;  Location: [place];  Service: Vascular;  Laterality: Right;  . PERIPHERAL VASCULAR INTERVENTION Right 01/25/2017   Procedure: Peripheral Vascular Intervention;  Surgeon: Elam Dutch, MD;  Location: [place] [facility];  Service: Cardiovascular;  Laterality: Right;  . TONSILLECTOMY      Family History  Problem Relation Age of Onset  . Heart disease Father   . Melanoma Father   . Testicular cancer Father   . Hypertension Mother   . Arthritis Mother   . Arthritis Sister   . Lung disease Neg Hx    Social History:  reports that he has been smoking cigarettes. He started smoking about 47 years ago. He has a 21.50 pack-year smoking history. He has never used smokeless tobacco. He reports current alcohol use. He reports that he does not use drugs.  Allergies: No Known Allergies  Medications Prior to Admission  Medication Sig Dispense Refill  . amiodarone (PACERONE) 200 MG tablet Take 200 mg by mouth 2 (two) times daily.     Marland Kitchen amLODipine (NORVASC) 10 MG tablet Take 10 mg by mouth at bedtime.     Marland Kitchen apixaban (ELIQUIS) 5 MG TABS tablet TAKE 1 TABLET(5 MG) BY MOUTH TWICE DAILY (Patient taking differently: Take 5 mg by mouth 2 (two) times daily. TAKE 1 TABLET(5 MG) BY MOUTH TWICE DAILY) 60 tablet 5  . atorvastatin (LIPITOR) 40 MG tablet Take 1 tablet (40 mg total) by  mouth daily. 30 tablet 0  . Carboxymethylcellul-Glycerin (REFRESH OPTIVE OP) Place 1 drop into both eyes every morning.     . chlorthalidone (HYGROTON) 25 MG tablet Take 25 mg by mouth daily.     . fenofibrate (TRICOR) 145 MG tablet Take 145 mg by mouth daily.    Marland Kitchen HYDROcodone-acetaminophen (NORCO/VICODIN) 5-325 MG tablet Take 1-2 tablets by mouth every 6 (six) hours as needed (for pain). (Patient taking differently: Take 1 tablet by mouth daily as needed for severe  pain. ) 8 tablet 0  . losartan (COZAAR) 100 MG tablet Take 1 tablet (100 mg total) by mouth daily. 30 tablet 3  . metFORMIN (GLUCOPHAGE) 500 MG tablet Take 500 mg by mouth daily with breakfast.    . ondansetron (ZOFRAN) 4 MG tablet Take 4 mg by mouth every 8 (eight) hours as needed for nausea or vomiting.    . potassium chloride (K-DUR) 10 MEQ tablet Take 10 mEq by mouth every other day.  5  . potassium chloride SA (K-DUR,KLOR-CON) 20 MEQ tablet Take 1 tablet (20 mEq total) by mouth 2 (two) times daily. (Patient taking differently: Take 20 mEq by mouth every other day. ) 180 tablet 1  . spironolactone (ALDACTONE) 25 MG tablet Take 0.5 tablets (12.5 mg total) by mouth daily. 15 tablet 6  . nicotine (NICODERM CQ - DOSED IN MG/24 HOURS) 21 mg/24hr patch Place 1 patch (21 mg total) onto the skin daily. (Patient not taking: Reported on 03/03/2018) 28 patch 0    Results for orders placed or performed during the hospital encounter of 09/09/18 (from the past 48 hour(s))  PT- INR Day of Surgery     Status: None   Collection Time: 09/09/18 10:19 AM  Result Value Ref Range   Prothrombin Time 13.4 11.4 - 15.2 seconds   INR 1.03     Comment: Performed at [facility] [address], [place], [place] [address]  Glucose, capillary     Status: Abnormal   Collection Time: 09/09/18 10:42 AM  Result Value Ref Range   Glucose-Capillary 192 (H) 70 - 99 mg/dL   Comment 1 Notify RN    Comment 2 Document in Chart   Glucose, capillary     Status: Abnormal   Collection Time: 09/09/18 11:48 AM  Result Value Ref Range   Glucose-Capillary 188 (H) 70 - 99 mg/dL   No results found.  Pertinent items noted in HPI and remainder of comprehensive ROS otherwise negative.  Blood pressure 120/61, pulse (!) 51, temperature 97.9 F (36.6 C), temperature source Oral, resp. rate 20, height 5\' 10"  (1.778 m), weight 77.6 kg, SpO2 98 %.  Patient is awake and alert.  Oriented and appropriate.  Cranial nerve function  intact.  Motor and sensory function extremities normal aside from some minimal dorsiflexion weakness in both feet.  Sensory examination nonfocal.  Reflexes hypoactive.  No evidence of long track signs.  Gait normal.  Posture mildly flexed per examination head ears eyes nose throat is unremarkable her chest and abdomen are benign.  Extremities are free from injury or deformity. Assessment/Plan L3-4, L4-5 stenosis with neurogenic claudication.  Plan bilateral L3-4 and L4-5 decompressive laminotomies and foraminotomies.  Risks and benefits been explained.  Patient wishes to proceed.  Mallie Mussel A  09/09/2018, 11:55 AM

## 2018-09-09 NOTE — Op Note (Signed)
Date of procedure: 09/09/2018  Date of dictation: Same  Service: Neurosurgery  Preoperative diagnosis: L3-4, L4-5 stenosis with neurogenic claudication  Postoperative diagnosis: Same  Procedure Name: Bilateral L3-4 and L4-5 decompressive laminotomies with foraminotomies  Surgeon: A., M.D.  Asst. Surgeon: Lorna Few NP  Anesthesia: General  Indication: 67 year old male with bilateral lower extremity pain paresthesias and weakness consistent with neurogenic claudication which is failed conservative management.  Work-up demonstrates evidence of disc generation with multifactorial spinal stenosis at L3-4 and L4-5.  Patient has failed conservative management presents now for decompressive surgery.  Operative note: After induction of anesthesia, patient position prone on the Wilson frame properly padded.  Lumbar region prepped and draped sterilely.  Incision made overlying L4-5.  Dissection performed.  Retractor placed.  L5-S1 level localized.  Dissection redirected one level up.  L3-4 and L4-5 exposed.  Bilateral decompressive laminotomies then performed using high-speed drill and Kerrison Rogers to remove inferior lamina at L3 and L4.  The medial aspect of the L3-4 and L4-5 facet joints and the superior rim of the L4 and L5 lamina bilaterally.  Ligament flavum elevated and resected.  Thecal sac and nerve roots identified.  Decompressive foraminotomies performed undercutting the gutters of the facet joint.  Exiting L3-L4 and L5 nerve roots were all well decompressed.  At this point a very thorough decompression was achieved.  There was no evidence of injury to thecal sac and nerve roots.  Wound is irrigated one final time.  Hemostasis was assured with bipolar cautery.  Wounds and closed in layers of Vicryl sutures.  Steri-Strips sterile dressing were applied.  No apparent complications.  Patient tolerated the procedure well and he returned to the recovery room postop.

## 2018-09-09 NOTE — Transfer of Care (Signed)
Immediate Anesthesia Transfer of Care Note  Patient: Scott [name]  Procedure(s) Performed: Laminectomy and Foraminotomy bilateral Lumbar three-Lumbar four - Lumbar four-Lumbar five (Bilateral Back)  Patient Location: PACU  Anesthesia Type:General  Level of Consciousness: awake, alert  and patient cooperative  Airway & Oxygen Therapy: Patient Spontanous Breathing  Post-op Assessment: Report given to RN and Post -op Vital signs reviewed and stable  Post vital signs: Reviewed and stable  Last Vitals:  Vitals Value Taken Time  BP 145/75 09/09/2018  3:27 PM  Temp    Pulse 63 09/09/2018  3:30 PM  Resp 11 09/09/2018  3:30 PM  SpO2 97 % 09/09/2018  3:30 PM  Vitals shown include unvalidated device data.  Last Pain:  Vitals:   09/09/18 1114  TempSrc:   PainSc: 0-No pain         Complications: No apparent anesthesia complications

## 2018-09-09 NOTE — Anesthesia Procedure Notes (Signed)
Procedure Name: Intubation Date/Time: 09/09/2018 1:42 PM Performed by: White, Amedeo Plenty, CRNA Pre-anesthesia Checklist: Patient identified, Emergency Drugs available, Suction available and Patient being monitored Patient Re-evaluated:Patient Re-evaluated prior to induction Oxygen Delivery Method: Circle System Utilized Preoxygenation: Pre-oxygenation with 100% oxygen Induction Type: IV induction, Rapid sequence and Cricoid Pressure applied Laryngoscope Size: Mac and 4 Grade View: Grade I Tube type: Oral Tube size: 7.5 mm Number of attempts: 1 Airway Equipment and Method: Stylet Placement Confirmation: ETT inserted through vocal cords under direct vision,  positive ETCO2 and breath sounds checked- equal and bilateral Secured at: 22 cm Tube secured with: Tape Dental Injury: Teeth and Oropharynx as per pre-operative assessment

## 2018-09-10 ENCOUNTER — Encounter (HOSPITAL_COMMUNITY): Payer: Self-pay | Admitting: Neurosurgery

## 2018-09-10 DIAGNOSIS — M48062 Spinal stenosis, lumbar region with neurogenic claudication: Secondary | ICD-10-CM | POA: Diagnosis not present

## 2018-09-10 LAB — GLUCOSE, CAPILLARY
GLUCOSE-CAPILLARY: 256 mg/dL — AB (ref 70–99)
Glucose-Capillary: 338 mg/dL — ABNORMAL HIGH (ref 70–99)

## 2018-09-10 MED ORDER — LIVING WELL WITH DIABETES BOOK
Freq: Once | Status: AC
  Administered 2018-09-10: 10:00:00
  Filled 2018-09-10: qty 1

## 2018-09-10 MED ORDER — HYDROCODONE-ACETAMINOPHEN 5-325 MG PO TABS: 1.0000 | ORAL_TABLET | ORAL | 0 refills | Status: DC | PRN

## 2018-09-10 MED ORDER — INSULIN ASPART 100 UNIT/ML ~~LOC~~ SOLN
10.0000 [IU] | Freq: Once | SUBCUTANEOUS | Status: AC
  Administered 2018-09-10: 10 [IU] via SUBCUTANEOUS

## 2018-09-10 NOTE — Progress Notes (Signed)
Occupational Therapy Evaluation Patient Details Name: Scott [name] MRN: [id number] DOB: 1951-07-21 Today's Date: 09/10/2018    History of Present Illness 67 yo s/p Laminectomy and Foraminotomy bilateral Lumbar three-Lumbar four - Lumbar four-Lumbar five (Bilateral). PMH: CAD; MI; CVA; Carotid endarterectomy; cardioversion.   Clinical Impression   Completed all education regarding back precautions for ADL and functional mobility for ADL. Pt /wife verbalized understanding. No further OT needed.     Follow Up Recommendations  No OT follow up;Supervision - Intermittent    Equipment Recommendations  None recommended by OT    Recommendations for Other Services       Precautions / Restrictions Precautions Precautions: Back Precaution Booklet Issued: Yes (comment) Restrictions Weight Bearing Restrictions: No      Mobility Bed Mobility Overal bed mobility: Modified Independent                Transfers Overall transfer level: Modified independent                    Balance Overall balance assessment: No apparent balance deficits (not formally assessed)                                         ADL either performed or assessed with clinical judgement   ADL Overall ADL's : Needs assistance/impaired                                     Functional mobility during ADLs: Modified independent General ADL Comments: Educated pt on back precautions for ADL and mobility. Pt states he takes tub baths. Educated pt on not taking tub baths until he was cleared by his MD to do so; educated on back precautions during ADL session with frequent reminders not to bed or twist. Pt abl eot complete figure 4 technique to complete LB ADL. wife presetn at end of session. Reviewed back precautions with wife.      Vision         Perception     Praxis      Pertinent Vitals/Pain Pain Assessment: Faces Pain Score: 2  Pain Location: operative  site Pain Descriptors / Indicators: Discomfort Pain Intervention(s): Limited activity within patient's tolerance     Hand Dominance Right   Extremity/Trunk Assessment Upper Extremity Assessment Upper Extremity Assessment: Overall WFL for tasks assessed   Lower Extremity Assessment Lower Extremity Assessment: (pt staes generalized weakness)   Cervical / Trunk Assessment Cervical / Trunk Assessment: Other exceptions(s/p back surgery)   Communication Communication Communication: No difficulties   Cognition Arousal/Alertness: Awake/alert Behavior During Therapy: WFL for tasks assessed/performed Overall Cognitive Status: Within Functional Limits for tasks assessed                                     General Comments       Exercises     Shoulder Instructions      Home Living Family/patient expects to be discharged to:: Private residence Living Arrangements: Spouse/significant other Available Help at Discharge: Available 24 hours/day Type of Home: House             Bathroom Shower/Tub: Teacher, early years/pre: Standard Bathroom Accessibility: Yes How Accessible: Accessible via walker Home Equipment: Shower seat -  built in;Hand held shower head          Prior Functioning/Environment Level of Independence: Independent        Comments: Has not been as active for the past year due to his back and health problems        OT Problem List: Decreased knowledge of precautions;Pain      OT Treatment/Interventions:      OT Goals(Current goals can be found in the care plan section) Acute Rehab OT Goals Patient Stated Goal: to be more active OT Goal Formulation: All assessment and education complete, DC therapy  OT Frequency:     Barriers to D/C:            Co-evaluation              AM-PAC OT "6 Clicks" Daily Activity     Outcome Measure Help from another person eating meals?: None Help from another person taking care of  personal grooming?: None Help from another person toileting, which includes using toliet, bedpan, or urinal?: None Help from another person bathing (including washing, rinsing, drying)?: None Help from another person to put on and taking off regular upper body clothing?: None Help from another person to put on and taking off regular lower body clothing?: None 6 Click Score: 24   End of Session Nurse Communication: Mobility status  Activity Tolerance: Patient tolerated treatment well Patient left: Other (comment)(in bathroom)  OT Visit Diagnosis: Muscle weakness (generalized) (M62.81);Pain Pain - part of body: (back)                Time: 0930-1000 OT Time Calculation (min): 30 min Charges:  OT General Charges $OT Visit: 1 Visit OT Evaluation $OT Eval Low Complexity: 1 Low OT Treatments $Self Care/Home Management : 8-22 mins  Maurie Boettcher, OT/L   Acute OT Clinical Specialist Acute Rehabilitation Services Pager [phone] Office [phone]   [facility] 09/10/2018, 10:37 AM

## 2018-09-10 NOTE — Progress Notes (Signed)
During the night at 2126, pt's blood glucose was checked and result was 444 on the meter; Novolog 5 units was administered per SSI bedtime coverage as existing order. It was rechecked at 2249, glucose level was still at 433. At around 2315, MD on call - Dr. Vertell Limber, was paged to inform of pt's high glucose readings. MD returned the call around 2330. He was informed pt.consumed 2 dinner trays about an hour prior when the bedtime CBG check was taken. Pt took his own supply of Metformin 500 mg during the early evening, which is what he takes at home. Dr. Vertell Limber ordered a one time dose of 10 units Novolog to be given subcutaneously. Prior to administration, pt's CBG was taken at 2356 as instructed by MD to recheck first, result was 402. At 0015, Novolog 10 units was given subcutaneously on pt's right arm. It was rechecked again at 0108 and CBG result trended down to 338. Through all the hyperglycemic event, pt was asymptomatic and had no complaints or issues related with his high sugar level. He was alert, coherent, ambulating down the hallway and having conversation with the RNs with very minimal pain on his surgical incision site. He stated he was aware that he was pre-diabetic but not really have gone high since this time. RN have reassured him he will be kept closely watched on his sugar levels and to report to RN any symptoms he may have through the night. At 0300, pt was rounded and he was sleeping comfortably in his room.  Will continue to monitor pt. Next CBG check will be at 0630 in the morning hoping his hyperglycemia would have trended down.

## 2018-09-10 NOTE — Progress Notes (Signed)
Patient is discharged from room 3C05 at this time. Alert and in stable condition. IV site d/c'd and instructions read to patient and spouse with understanding verbalized. Left unit via wheelchair with all belongings at side. 

## 2018-09-10 NOTE — Progress Notes (Signed)
Inpatient Diabetes Program Recommendations  AACE/ADA: New Consensus Statement on Inpatient Glycemic Control (2015)  Target Ranges:  Prepandial:   less than 140 mg/dL      Peak postprandial:   less than 180 mg/dL (1-2 hours)      Critically ill patients:  140 - 180 mg/dL   Lab Results  Component Value Date   GLUCAP 256 (H) 09/10/2018   HGBA1C 11.8 (H) 09/03/2018    Review of Glycemic Control Results for Scott [name], Scott [name] (MRN [id number]) as of 09/10/2018 09:19  Ref. Range 09/09/2018 21:26 09/09/2018 22:49 09/09/2018 23:56 09/10/2018 01:08 09/10/2018 06:30  Glucose-Capillary Latest Ref Range: 70 - 99 mg/dL 444 (H) 433 (H) 402 (H) 338 (H) 256 (H)   Diabetes history: DM2 Outpatient Diabetes medications: Metformin 500 mg bid Current orders for Inpatient glycemic control: Metformin 500 mg qd + Novolog resistant scale tid   Inpatient Diabetes Program Recommendations:   Spoke with pt about A1C results 11.8 with them and explained what an A1C is, basic pathophysiology of DM Type 2, basic home care, basic diabetes diet nutrition principles, importance of checking CBGs and maintaining good CBG control to prevent long-term and short-term complications. Reviewed signs and symptoms of hyperglycemia and hypoglycemia and how to treat hypoglycemia at home. Also reviewed blood sugar goals at home.  RNs to provide ongoing basic DM education at bedside with this patient. Have ordered educational booklet and placed RD consult for DM diet education for this patient.  Patient has a glucose meter @ home and typically checks his CBG fasting. Requested patient to check more often and check 2 hr post meals part of his checks. Patient feels that he is able to change nutrition further to decrease sugars and carbohydrate intake. Also states his PCP recently increased his Metformin to 500 mg bid.  Please add Diabetes to diagnosis list so able to refer for outpatient nutrition and diabetes management classes. Consider  on discharge: -Increase Metformin to 1 gm bid -Appointment with PCP as soon as possible  Thank you, Bethena Roys E. , RN, MSN, CDE  Diabetes Coordinator Inpatient Glycemic Control Team Team Pager [phone] (8am-5pm) 09/10/2018 9:30 AM

## 2018-09-10 NOTE — Discharge Instructions (Signed)

## 2018-09-10 NOTE — Discharge Summary (Signed)
Physician Discharge Summary  Patient ID: Scott [name] MRN: [id number] DOB/AGE: Dec 19, 1950 67 y.o.  Admit date: 09/09/2018 Discharge date: 09/10/2018  Admission Diagnoses:  Discharge Diagnoses:  Active Problems:   Lumbar stenosis with neurogenic claudication   Discharged Condition: good  Hospital Course: Patient to the hospital where he underwent uncomplicated lumbar decompression and foraminotomies at L3-4 and L4-5.  Postop [name] doing well.  Back and lower extremity pain improved.  Standing and walking better.  Ready for discharge home.  Consults:   Significant Diagnostic Studies:   Treatments:   Discharge Exam: Blood pressure 136/73, pulse 66, temperature 98.7 F (37.1 C), temperature source Oral, resp. rate 19, height 5\' 10"  (1.778 m), weight 77.6 kg, SpO2 98 %. Awake and alert.  Oriented and appropriate.  Cranial nerve function intact.  Motor and sensory function extremities intact.  Wound clean and dry.  Chest and abdomen benign.  Disposition: Discharge disposition: 01-Home or Self Care        Allergies as of 09/10/2018   No Known Allergies     Medication List    TAKE these medications   amiodarone 200 MG tablet Commonly known as:  PACERONE Take 200 mg by mouth 2 (two) times daily.   amLODipine 10 MG tablet Commonly known as:  NORVASC Take 10 mg by mouth at bedtime.   apixaban 5 MG Tabs tablet Commonly known as:  ELIQUIS TAKE 1 TABLET(5 MG) BY MOUTH TWICE DAILY What changed:    how much to take  how to take this  when to take this   atorvastatin 40 MG tablet Commonly known as:  LIPITOR Take 1 tablet (40 mg total) by mouth daily.   chlorthalidone 25 MG tablet Commonly known as:  HYGROTON Take 25 mg by mouth daily.   fenofibrate 145 MG tablet Commonly known as:  TRICOR Take 145 mg by mouth daily.   HYDROcodone-acetaminophen 5-325 MG tablet Commonly known as:  NORCO/VICODIN Take 1-2 tablets by mouth every 6 (six) hours as needed (for  pain). What changed:    how much to take  when to take this  reasons to take this   HYDROcodone-acetaminophen 5-325 MG tablet Commonly known as:  NORCO/VICODIN Take 1-2 tablets by mouth every 4 (four) hours as needed for moderate pain ((score 4 to 6)). What changed:  You were already taking a medication with the same name, and this prescription was added. Make sure you understand how and when to take each.   losartan 100 MG tablet Commonly known as:  COZAAR Take 1 tablet (100 mg total) by mouth daily.   metFORMIN 500 MG tablet Commonly known as:  GLUCOPHAGE Take 500 mg by mouth daily with breakfast.   nicotine 21 mg/24hr patch Commonly known as:  NICODERM CQ - dosed in mg/24 hours Place 1 patch (21 mg total) onto the skin daily.   ondansetron 4 MG tablet Commonly known as:  ZOFRAN Take 4 mg by mouth every 8 (eight) hours as needed for nausea or vomiting.   potassium chloride 10 MEQ tablet Commonly known as:  K-DUR Take 10 mEq by mouth every other day.   potassium chloride SA 20 MEQ tablet Commonly known as:  K-DUR,KLOR-CON Take 1 tablet (20 mEq total) by mouth 2 (two) times daily. What changed:  when to take this   REFRESH OPTIVE OP Place 1 drop into both eyes every morning.   spironolactone 25 MG tablet Commonly known as:  ALDACTONE Take 0.5 tablets (12.5 mg total) by mouth daily.  Signed: Cooper Render  09/10/2018, 10:20 AM

## 2018-09-16 ENCOUNTER — Other Ambulatory Visit: Payer: Self-pay | Admitting: Cardiovascular Disease

## 2018-09-16 NOTE — Telephone Encounter (Signed)
This a A-fib clinic pt.

## 2018-10-07 DIAGNOSIS — Z7984 Long term (current) use of oral hypoglycemic drugs: Secondary | ICD-10-CM | POA: Diagnosis not present

## 2018-10-07 DIAGNOSIS — I10 Essential (primary) hypertension: Secondary | ICD-10-CM | POA: Diagnosis not present

## 2018-10-07 DIAGNOSIS — Z0000 Encounter for general adult medical examination without abnormal findings: Secondary | ICD-10-CM | POA: Diagnosis not present

## 2018-10-07 DIAGNOSIS — E7800 Pure hypercholesterolemia, unspecified: Secondary | ICD-10-CM | POA: Diagnosis not present

## 2018-10-07 DIAGNOSIS — I2510 Atherosclerotic heart disease of native coronary artery without angina pectoris: Secondary | ICD-10-CM | POA: Diagnosis not present

## 2018-10-07 DIAGNOSIS — Z1159 Encounter for screening for other viral diseases: Secondary | ICD-10-CM | POA: Diagnosis not present

## 2018-10-07 DIAGNOSIS — Z125 Encounter for screening for malignant neoplasm of prostate: Secondary | ICD-10-CM | POA: Diagnosis not present

## 2018-10-07 DIAGNOSIS — E119 Type 2 diabetes mellitus without complications: Secondary | ICD-10-CM | POA: Diagnosis not present

## 2018-10-07 DIAGNOSIS — M170 Bilateral primary osteoarthritis of knee: Secondary | ICD-10-CM | POA: Diagnosis not present

## 2018-10-07 DIAGNOSIS — G894 Chronic pain syndrome: Secondary | ICD-10-CM | POA: Diagnosis not present

## 2018-10-07 DIAGNOSIS — I480 Paroxysmal atrial fibrillation: Secondary | ICD-10-CM | POA: Diagnosis not present

## 2018-10-07 DIAGNOSIS — K210 Gastro-esophageal reflux disease with esophagitis: Secondary | ICD-10-CM | POA: Diagnosis not present

## 2018-10-07 DIAGNOSIS — I739 Peripheral vascular disease, unspecified: Secondary | ICD-10-CM | POA: Diagnosis not present

## 2018-10-17 DIAGNOSIS — M5416 Radiculopathy, lumbar region: Secondary | ICD-10-CM | POA: Diagnosis not present

## 2018-10-17 DIAGNOSIS — M545 Low back pain: Secondary | ICD-10-CM | POA: Diagnosis not present

## 2018-10-17 DIAGNOSIS — M2560 Stiffness of unspecified joint, not elsewhere classified: Secondary | ICD-10-CM | POA: Diagnosis not present

## 2018-10-17 DIAGNOSIS — M629 Disorder of muscle, unspecified: Secondary | ICD-10-CM | POA: Diagnosis not present

## 2018-10-21 DIAGNOSIS — M629 Disorder of muscle, unspecified: Secondary | ICD-10-CM | POA: Diagnosis not present

## 2018-10-21 DIAGNOSIS — M5416 Radiculopathy, lumbar region: Secondary | ICD-10-CM | POA: Diagnosis not present

## 2018-10-21 DIAGNOSIS — M2560 Stiffness of unspecified joint, not elsewhere classified: Secondary | ICD-10-CM | POA: Diagnosis not present

## 2018-10-21 DIAGNOSIS — M545 Low back pain: Secondary | ICD-10-CM | POA: Diagnosis not present

## 2018-10-23 DIAGNOSIS — M545 Low back pain: Secondary | ICD-10-CM | POA: Diagnosis not present

## 2018-10-23 DIAGNOSIS — M2560 Stiffness of unspecified joint, not elsewhere classified: Secondary | ICD-10-CM | POA: Diagnosis not present

## 2018-10-23 DIAGNOSIS — M629 Disorder of muscle, unspecified: Secondary | ICD-10-CM | POA: Diagnosis not present

## 2018-10-23 DIAGNOSIS — M5416 Radiculopathy, lumbar region: Secondary | ICD-10-CM | POA: Diagnosis not present

## 2018-10-24 DIAGNOSIS — M545 Low back pain: Secondary | ICD-10-CM | POA: Diagnosis not present

## 2018-10-24 DIAGNOSIS — M2560 Stiffness of unspecified joint, not elsewhere classified: Secondary | ICD-10-CM | POA: Diagnosis not present

## 2018-10-24 DIAGNOSIS — M5416 Radiculopathy, lumbar region: Secondary | ICD-10-CM | POA: Diagnosis not present

## 2018-10-24 DIAGNOSIS — M629 Disorder of muscle, unspecified: Secondary | ICD-10-CM | POA: Diagnosis not present

## 2018-10-28 DIAGNOSIS — M5416 Radiculopathy, lumbar region: Secondary | ICD-10-CM | POA: Diagnosis not present

## 2018-10-28 DIAGNOSIS — M545 Low back pain: Secondary | ICD-10-CM | POA: Diagnosis not present

## 2018-10-28 DIAGNOSIS — M2560 Stiffness of unspecified joint, not elsewhere classified: Secondary | ICD-10-CM | POA: Diagnosis not present

## 2018-10-28 DIAGNOSIS — M629 Disorder of muscle, unspecified: Secondary | ICD-10-CM | POA: Diagnosis not present

## 2018-10-29 DIAGNOSIS — M629 Disorder of muscle, unspecified: Secondary | ICD-10-CM | POA: Diagnosis not present

## 2018-10-29 DIAGNOSIS — M5416 Radiculopathy, lumbar region: Secondary | ICD-10-CM | POA: Diagnosis not present

## 2018-10-29 DIAGNOSIS — M545 Low back pain: Secondary | ICD-10-CM | POA: Diagnosis not present

## 2018-10-29 DIAGNOSIS — M2560 Stiffness of unspecified joint, not elsewhere classified: Secondary | ICD-10-CM | POA: Diagnosis not present

## 2018-10-31 ENCOUNTER — Other Ambulatory Visit: Payer: Self-pay | Admitting: Physician Assistant

## 2018-10-31 DIAGNOSIS — M2560 Stiffness of unspecified joint, not elsewhere classified: Secondary | ICD-10-CM | POA: Diagnosis not present

## 2018-10-31 DIAGNOSIS — M5416 Radiculopathy, lumbar region: Secondary | ICD-10-CM | POA: Diagnosis not present

## 2018-10-31 DIAGNOSIS — R1013 Epigastric pain: Secondary | ICD-10-CM | POA: Diagnosis not present

## 2018-10-31 DIAGNOSIS — R110 Nausea: Secondary | ICD-10-CM | POA: Diagnosis not present

## 2018-10-31 DIAGNOSIS — M545 Low back pain: Secondary | ICD-10-CM | POA: Diagnosis not present

## 2018-10-31 DIAGNOSIS — M629 Disorder of muscle, unspecified: Secondary | ICD-10-CM | POA: Diagnosis not present

## 2018-11-03 DIAGNOSIS — M545 Low back pain: Secondary | ICD-10-CM | POA: Diagnosis not present

## 2018-11-03 DIAGNOSIS — M5416 Radiculopathy, lumbar region: Secondary | ICD-10-CM | POA: Diagnosis not present

## 2018-11-03 DIAGNOSIS — M629 Disorder of muscle, unspecified: Secondary | ICD-10-CM | POA: Diagnosis not present

## 2018-11-03 DIAGNOSIS — M2560 Stiffness of unspecified joint, not elsewhere classified: Secondary | ICD-10-CM | POA: Diagnosis not present

## 2018-11-04 ENCOUNTER — Ambulatory Visit
Admission: RE | Admit: 2018-11-04 | Discharge: 2018-11-04 | Disposition: A | Discharge status: Home / Self Care | Payer: Medicare Other | Source: Ambulatory Visit | Attending: Physician Assistant | Admitting: Physician Assistant

## 2018-11-04 DIAGNOSIS — R110 Nausea: Secondary | ICD-10-CM

## 2018-11-04 DIAGNOSIS — R109 Unspecified abdominal pain: Secondary | ICD-10-CM | POA: Diagnosis not present

## 2018-11-04 DIAGNOSIS — R1013 Epigastric pain: Secondary | ICD-10-CM

## 2018-11-05 DIAGNOSIS — M2560 Stiffness of unspecified joint, not elsewhere classified: Secondary | ICD-10-CM | POA: Diagnosis not present

## 2018-11-05 DIAGNOSIS — M5416 Radiculopathy, lumbar region: Secondary | ICD-10-CM | POA: Diagnosis not present

## 2018-11-05 DIAGNOSIS — M545 Low back pain: Secondary | ICD-10-CM | POA: Diagnosis not present

## 2018-11-05 DIAGNOSIS — M629 Disorder of muscle, unspecified: Secondary | ICD-10-CM | POA: Diagnosis not present

## 2018-11-07 DIAGNOSIS — M629 Disorder of muscle, unspecified: Secondary | ICD-10-CM | POA: Diagnosis not present

## 2018-11-07 DIAGNOSIS — M2560 Stiffness of unspecified joint, not elsewhere classified: Secondary | ICD-10-CM | POA: Diagnosis not present

## 2018-11-07 DIAGNOSIS — M5416 Radiculopathy, lumbar region: Secondary | ICD-10-CM | POA: Diagnosis not present

## 2018-11-07 DIAGNOSIS — M545 Low back pain: Secondary | ICD-10-CM | POA: Diagnosis not present

## 2018-11-10 DIAGNOSIS — M629 Disorder of muscle, unspecified: Secondary | ICD-10-CM | POA: Diagnosis not present

## 2018-11-10 DIAGNOSIS — M2560 Stiffness of unspecified joint, not elsewhere classified: Secondary | ICD-10-CM | POA: Diagnosis not present

## 2018-11-10 DIAGNOSIS — M5416 Radiculopathy, lumbar region: Secondary | ICD-10-CM | POA: Diagnosis not present

## 2018-11-10 DIAGNOSIS — M545 Low back pain: Secondary | ICD-10-CM | POA: Diagnosis not present

## 2018-11-11 DIAGNOSIS — M545 Low back pain: Secondary | ICD-10-CM | POA: Diagnosis not present

## 2018-11-11 DIAGNOSIS — M5416 Radiculopathy, lumbar region: Secondary | ICD-10-CM | POA: Diagnosis not present

## 2018-11-11 DIAGNOSIS — M629 Disorder of muscle, unspecified: Secondary | ICD-10-CM | POA: Diagnosis not present

## 2018-11-11 DIAGNOSIS — M2560 Stiffness of unspecified joint, not elsewhere classified: Secondary | ICD-10-CM | POA: Diagnosis not present

## 2018-11-14 ENCOUNTER — Other Ambulatory Visit: Payer: Self-pay | Admitting: Physician Assistant

## 2018-11-14 DIAGNOSIS — R110 Nausea: Secondary | ICD-10-CM | POA: Diagnosis not present

## 2018-11-14 DIAGNOSIS — R1013 Epigastric pain: Secondary | ICD-10-CM | POA: Diagnosis not present

## 2018-11-14 DIAGNOSIS — K589 Irritable bowel syndrome without diarrhea: Secondary | ICD-10-CM | POA: Diagnosis not present

## 2018-11-14 DIAGNOSIS — R1033 Periumbilical pain: Secondary | ICD-10-CM

## 2018-11-14 DIAGNOSIS — K210 Gastro-esophageal reflux disease with esophagitis: Secondary | ICD-10-CM | POA: Diagnosis not present

## 2018-11-15 ENCOUNTER — Other Ambulatory Visit (HOSPITAL_COMMUNITY): Payer: Self-pay | Admitting: Physician Assistant

## 2018-11-18 DIAGNOSIS — M2560 Stiffness of unspecified joint, not elsewhere classified: Secondary | ICD-10-CM | POA: Diagnosis not present

## 2018-11-18 DIAGNOSIS — M629 Disorder of muscle, unspecified: Secondary | ICD-10-CM | POA: Diagnosis not present

## 2018-11-18 DIAGNOSIS — M5416 Radiculopathy, lumbar region: Secondary | ICD-10-CM | POA: Diagnosis not present

## 2018-11-18 DIAGNOSIS — M545 Low back pain: Secondary | ICD-10-CM | POA: Diagnosis not present

## 2018-11-19 DIAGNOSIS — M5416 Radiculopathy, lumbar region: Secondary | ICD-10-CM | POA: Diagnosis not present

## 2018-11-19 DIAGNOSIS — M2560 Stiffness of unspecified joint, not elsewhere classified: Secondary | ICD-10-CM | POA: Diagnosis not present

## 2018-11-19 DIAGNOSIS — M629 Disorder of muscle, unspecified: Secondary | ICD-10-CM | POA: Diagnosis not present

## 2018-11-19 DIAGNOSIS — M545 Low back pain: Secondary | ICD-10-CM | POA: Diagnosis not present

## 2018-11-21 ENCOUNTER — Ambulatory Visit
Admission: RE | Admit: 2018-11-21 | Discharge: 2018-11-21 | Disposition: A | Discharge status: Home / Self Care | Payer: Medicare Other | Source: Ambulatory Visit | Attending: Physician Assistant | Admitting: Physician Assistant

## 2018-11-21 DIAGNOSIS — K7689 Other specified diseases of liver: Secondary | ICD-10-CM | POA: Diagnosis not present

## 2018-11-21 DIAGNOSIS — R110 Nausea: Secondary | ICD-10-CM

## 2018-11-21 DIAGNOSIS — R1033 Periumbilical pain: Secondary | ICD-10-CM

## 2018-11-24 DIAGNOSIS — M629 Disorder of muscle, unspecified: Secondary | ICD-10-CM | POA: Diagnosis not present

## 2018-11-24 DIAGNOSIS — E119 Type 2 diabetes mellitus without complications: Secondary | ICD-10-CM | POA: Diagnosis not present

## 2018-11-24 DIAGNOSIS — M545 Low back pain: Secondary | ICD-10-CM | POA: Diagnosis not present

## 2018-11-24 DIAGNOSIS — R110 Nausea: Secondary | ICD-10-CM | POA: Diagnosis not present

## 2018-11-24 DIAGNOSIS — M2560 Stiffness of unspecified joint, not elsewhere classified: Secondary | ICD-10-CM | POA: Diagnosis not present

## 2018-11-24 DIAGNOSIS — M5416 Radiculopathy, lumbar region: Secondary | ICD-10-CM | POA: Diagnosis not present

## 2018-11-24 DIAGNOSIS — Z7984 Long term (current) use of oral hypoglycemic drugs: Secondary | ICD-10-CM | POA: Diagnosis not present

## 2018-11-26 DIAGNOSIS — M545 Low back pain: Secondary | ICD-10-CM | POA: Diagnosis not present

## 2018-11-26 DIAGNOSIS — M2560 Stiffness of unspecified joint, not elsewhere classified: Secondary | ICD-10-CM | POA: Diagnosis not present

## 2018-11-26 DIAGNOSIS — M629 Disorder of muscle, unspecified: Secondary | ICD-10-CM | POA: Diagnosis not present

## 2018-11-26 DIAGNOSIS — M5416 Radiculopathy, lumbar region: Secondary | ICD-10-CM | POA: Diagnosis not present

## 2018-12-01 DIAGNOSIS — M545 Low back pain: Secondary | ICD-10-CM | POA: Diagnosis not present

## 2018-12-01 DIAGNOSIS — M2560 Stiffness of unspecified joint, not elsewhere classified: Secondary | ICD-10-CM | POA: Diagnosis not present

## 2018-12-01 DIAGNOSIS — M629 Disorder of muscle, unspecified: Secondary | ICD-10-CM | POA: Diagnosis not present

## 2018-12-01 DIAGNOSIS — M5416 Radiculopathy, lumbar region: Secondary | ICD-10-CM | POA: Diagnosis not present

## 2018-12-02 DIAGNOSIS — E7800 Pure hypercholesterolemia, unspecified: Secondary | ICD-10-CM | POA: Diagnosis not present

## 2018-12-02 DIAGNOSIS — I2510 Atherosclerotic heart disease of native coronary artery without angina pectoris: Secondary | ICD-10-CM | POA: Diagnosis not present

## 2018-12-02 DIAGNOSIS — I10 Essential (primary) hypertension: Secondary | ICD-10-CM | POA: Diagnosis not present

## 2018-12-02 DIAGNOSIS — Z79899 Other long term (current) drug therapy: Secondary | ICD-10-CM | POA: Diagnosis not present

## 2018-12-02 DIAGNOSIS — E1151 Type 2 diabetes mellitus with diabetic peripheral angiopathy without gangrene: Secondary | ICD-10-CM | POA: Diagnosis not present

## 2018-12-02 DIAGNOSIS — Z7984 Long term (current) use of oral hypoglycemic drugs: Secondary | ICD-10-CM | POA: Diagnosis not present

## 2018-12-02 DIAGNOSIS — Z6826 Body mass index (BMI) 26.0-26.9, adult: Secondary | ICD-10-CM | POA: Diagnosis not present

## 2018-12-02 DIAGNOSIS — R110 Nausea: Secondary | ICD-10-CM | POA: Diagnosis not present

## 2018-12-02 DIAGNOSIS — I4891 Unspecified atrial fibrillation: Secondary | ICD-10-CM | POA: Diagnosis not present

## 2018-12-02 DIAGNOSIS — I252 Old myocardial infarction: Secondary | ICD-10-CM | POA: Diagnosis not present

## 2018-12-03 DIAGNOSIS — M2560 Stiffness of unspecified joint, not elsewhere classified: Secondary | ICD-10-CM | POA: Diagnosis not present

## 2018-12-03 DIAGNOSIS — M5416 Radiculopathy, lumbar region: Secondary | ICD-10-CM | POA: Diagnosis not present

## 2018-12-03 DIAGNOSIS — M629 Disorder of muscle, unspecified: Secondary | ICD-10-CM | POA: Diagnosis not present

## 2018-12-03 DIAGNOSIS — M545 Low back pain: Secondary | ICD-10-CM | POA: Diagnosis not present

## 2018-12-05 DIAGNOSIS — E119 Type 2 diabetes mellitus without complications: Secondary | ICD-10-CM | POA: Diagnosis not present

## 2018-12-08 DIAGNOSIS — M545 Low back pain: Secondary | ICD-10-CM | POA: Diagnosis not present

## 2018-12-08 DIAGNOSIS — M2560 Stiffness of unspecified joint, not elsewhere classified: Secondary | ICD-10-CM | POA: Diagnosis not present

## 2018-12-08 DIAGNOSIS — M629 Disorder of muscle, unspecified: Secondary | ICD-10-CM | POA: Diagnosis not present

## 2018-12-08 DIAGNOSIS — M5416 Radiculopathy, lumbar region: Secondary | ICD-10-CM | POA: Diagnosis not present

## 2018-12-10 DIAGNOSIS — M545 Low back pain: Secondary | ICD-10-CM | POA: Diagnosis not present

## 2018-12-10 DIAGNOSIS — M2560 Stiffness of unspecified joint, not elsewhere classified: Secondary | ICD-10-CM | POA: Diagnosis not present

## 2018-12-10 DIAGNOSIS — M629 Disorder of muscle, unspecified: Secondary | ICD-10-CM | POA: Diagnosis not present

## 2018-12-10 DIAGNOSIS — M5416 Radiculopathy, lumbar region: Secondary | ICD-10-CM | POA: Diagnosis not present

## 2018-12-15 DIAGNOSIS — M545 Low back pain: Secondary | ICD-10-CM | POA: Diagnosis not present

## 2018-12-15 DIAGNOSIS — M5416 Radiculopathy, lumbar region: Secondary | ICD-10-CM | POA: Diagnosis not present

## 2018-12-15 DIAGNOSIS — M629 Disorder of muscle, unspecified: Secondary | ICD-10-CM | POA: Diagnosis not present

## 2018-12-15 DIAGNOSIS — M2560 Stiffness of unspecified joint, not elsewhere classified: Secondary | ICD-10-CM | POA: Diagnosis not present

## 2018-12-25 DIAGNOSIS — M5416 Radiculopathy, lumbar region: Secondary | ICD-10-CM | POA: Diagnosis not present

## 2018-12-25 DIAGNOSIS — M545 Low back pain: Secondary | ICD-10-CM | POA: Diagnosis not present

## 2018-12-25 DIAGNOSIS — M629 Disorder of muscle, unspecified: Secondary | ICD-10-CM | POA: Diagnosis not present

## 2018-12-25 DIAGNOSIS — M2560 Stiffness of unspecified joint, not elsewhere classified: Secondary | ICD-10-CM | POA: Diagnosis not present

## 2018-12-31 DIAGNOSIS — Z7984 Long term (current) use of oral hypoglycemic drugs: Secondary | ICD-10-CM | POA: Diagnosis not present

## 2018-12-31 DIAGNOSIS — E119 Type 2 diabetes mellitus without complications: Secondary | ICD-10-CM | POA: Diagnosis not present

## 2019-01-01 DIAGNOSIS — M48062 Spinal stenosis, lumbar region with neurogenic claudication: Secondary | ICD-10-CM | POA: Diagnosis not present

## 2019-01-13 ENCOUNTER — Other Ambulatory Visit: Payer: Self-pay | Admitting: Cardiovascular Disease

## 2019-01-13 NOTE — Telephone Encounter (Signed)
68 yo, 77.6kg, Scr 1.26 on 09/03/18 Last OV 03/03/18 (also seeing [facility] cardiology) Indication Afib

## 2019-02-09 DIAGNOSIS — R509 Fever, unspecified: Secondary | ICD-10-CM | POA: Diagnosis not present

## 2019-02-12 ENCOUNTER — Other Ambulatory Visit: Payer: Self-pay | Admitting: Physician Assistant

## 2019-03-03 ENCOUNTER — Other Ambulatory Visit (HOSPITAL_COMMUNITY): Payer: Self-pay | Admitting: *Deleted

## 2019-03-03 MED ORDER — POTASSIUM CHLORIDE ER 10 MEQ PO TBCR: EXTENDED_RELEASE_TABLET | ORAL | 0 refills | Status: DC

## 2019-04-02 DIAGNOSIS — E7800 Pure hypercholesterolemia, unspecified: Secondary | ICD-10-CM | POA: Diagnosis not present

## 2019-04-02 DIAGNOSIS — I480 Paroxysmal atrial fibrillation: Secondary | ICD-10-CM | POA: Diagnosis not present

## 2019-04-02 DIAGNOSIS — I10 Essential (primary) hypertension: Secondary | ICD-10-CM | POA: Diagnosis not present

## 2019-04-02 DIAGNOSIS — I2510 Atherosclerotic heart disease of native coronary artery without angina pectoris: Secondary | ICD-10-CM | POA: Diagnosis not present

## 2019-04-02 DIAGNOSIS — E119 Type 2 diabetes mellitus without complications: Secondary | ICD-10-CM | POA: Diagnosis not present

## 2019-04-02 DIAGNOSIS — G894 Chronic pain syndrome: Secondary | ICD-10-CM | POA: Diagnosis not present

## 2019-05-04 DIAGNOSIS — E119 Type 2 diabetes mellitus without complications: Secondary | ICD-10-CM | POA: Diagnosis not present

## 2019-05-04 DIAGNOSIS — Z7984 Long term (current) use of oral hypoglycemic drugs: Secondary | ICD-10-CM | POA: Diagnosis not present

## 2019-05-04 DIAGNOSIS — R5383 Other fatigue: Secondary | ICD-10-CM | POA: Diagnosis not present

## 2019-05-04 DIAGNOSIS — E7800 Pure hypercholesterolemia, unspecified: Secondary | ICD-10-CM | POA: Diagnosis not present

## 2019-05-08 IMAGING — MR MR HEAD W/O CM
9 of 11 series · 33 of 48 positions shown · non-contrast
Comparison: 12/04/2017 CT of the head.

CLINICAL DATA: 66 y/o M; 3 1 history of right-sided vision changes
and left upper extremity weakness/discoordination.

EXAM:
MRI HEAD WITHOUT CONTRAST
TECHNIQUE: Multiplanar, multiecho pulse sequences of the brain and surrounding
structures were obtained without intravenous contrast.

[Series 3: DWI · axial · 3.0mm · 0.94mm/px · z∈[-76,+89]mm · 8 of 112 slices shown (1 of 2)]
[im 1/112]
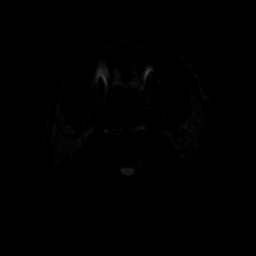
[im 16/112]
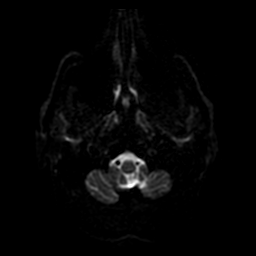
[im 32/112]
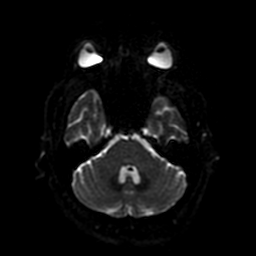
[im 48/112]
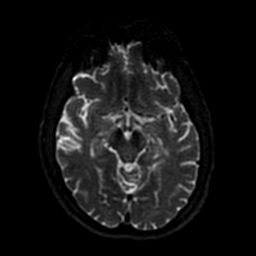
[im 64/112]
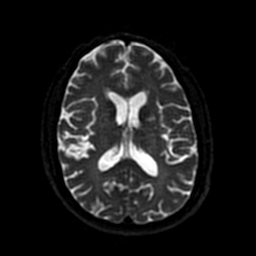
[im 80/112]
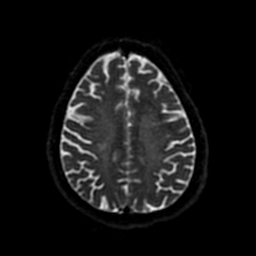
[im 96/112]
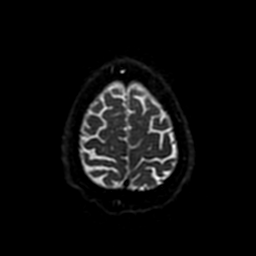
[im 112/112]
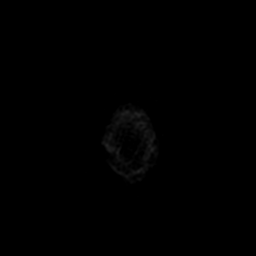

[Series 4: DWI · coronal · 4.0mm · 0.94mm/px · 5 of 74 slices shown (2 of 2)]
[im 1/74]
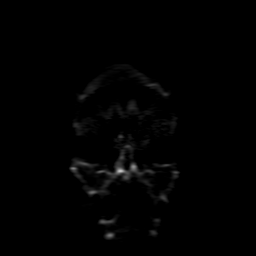
[im 19/74]
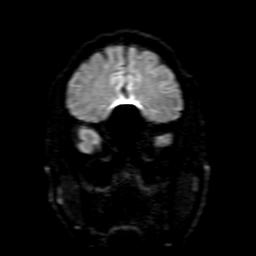
[im 37/74]
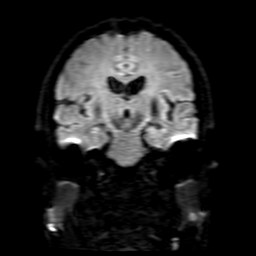
[im 55/74]
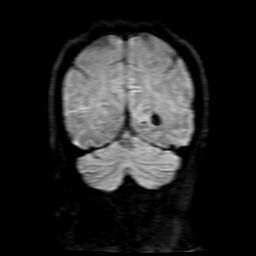
[im 74/74]
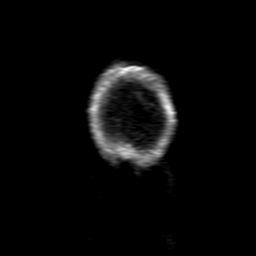

[Series 5: FLAIR · sagittal · 5.0mm · 0.47mm/px · 2 of 25 slices shown (1 of 2)]
[im 1/25]
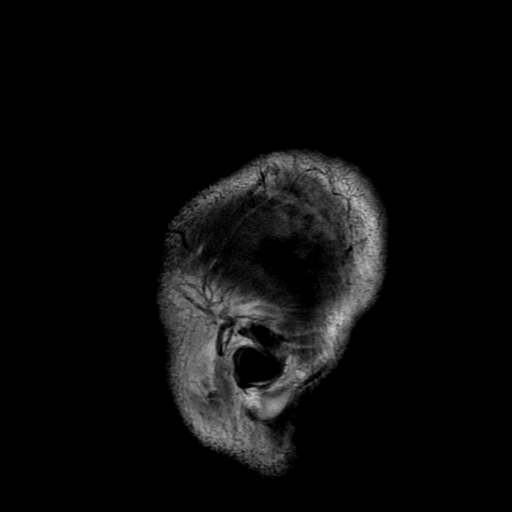
[im 25/25]
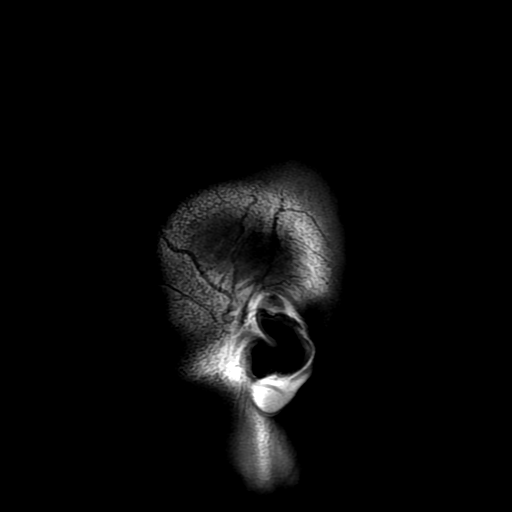

[Series 6: T2 · axial · 5.0mm · 0.47mm/px · z∈[-79,+83]mm · 2 of 28 slices shown (1 of 2)]
[im 1/28]
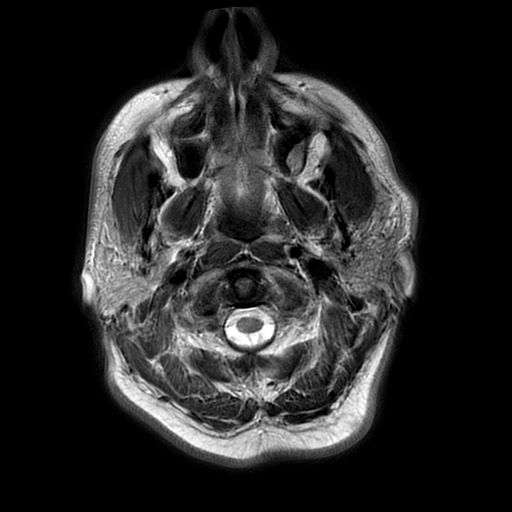
[im 28/28]
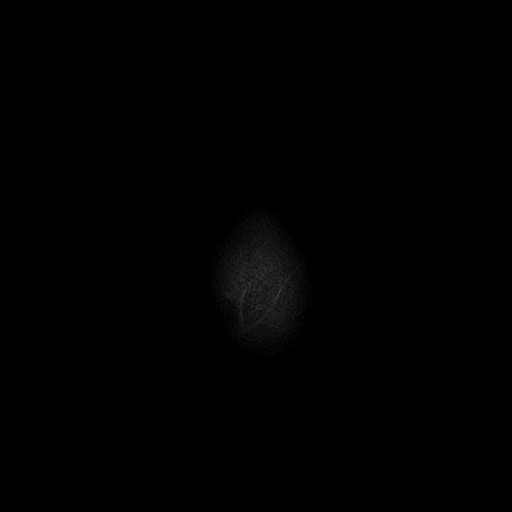

[Series 7: FLAIR · axial · 3.0mm · 0.45mm/px · z∈[-78,+83]mm · 2 of 28 slices shown (2 of 2)]
[im 1/28]
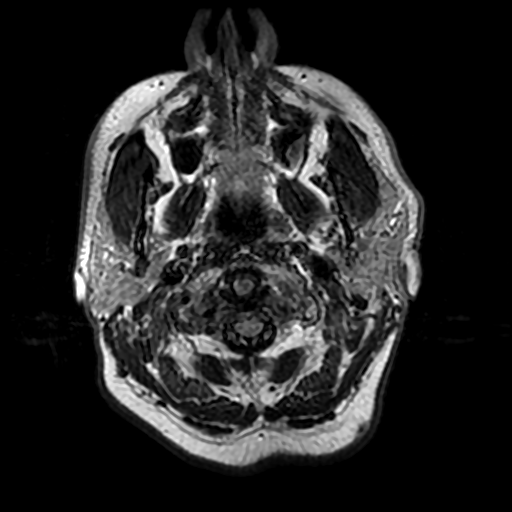
[im 28/28]
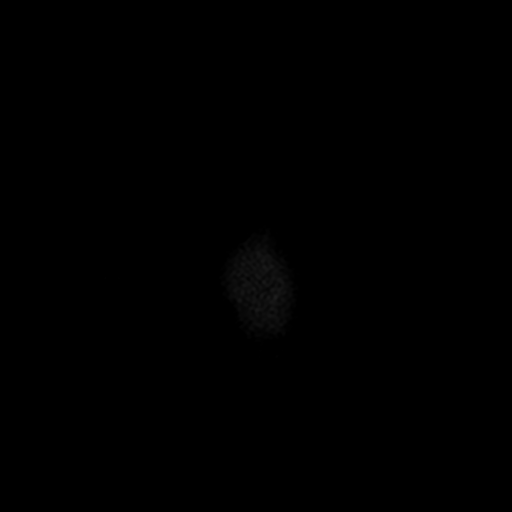

[Series 8: (person_name) · axial · 3.0mm · 0.47mm/px · z∈[-64,+23]mm · 5 of 104 slices shown]
[im 1/104]
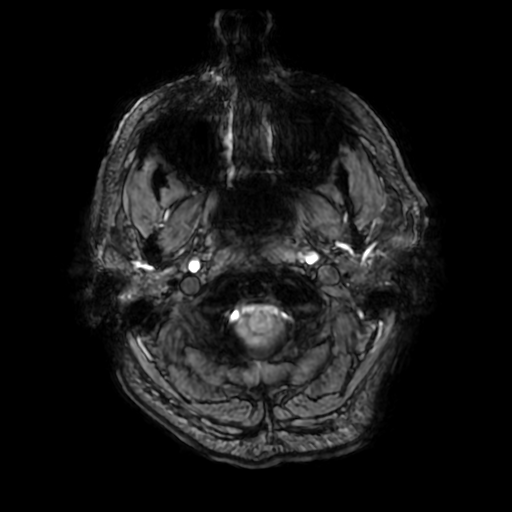
[im 15/104]
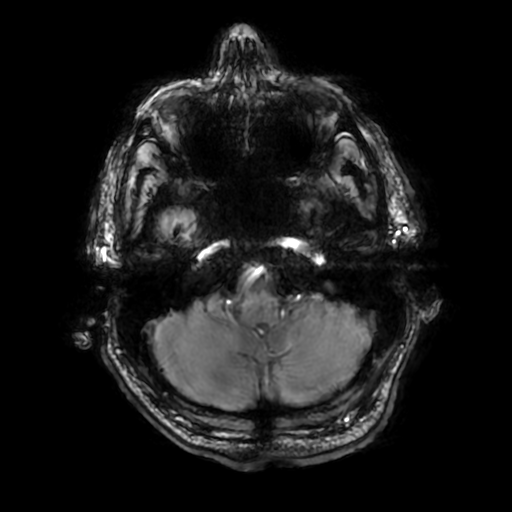
[im 30/104]
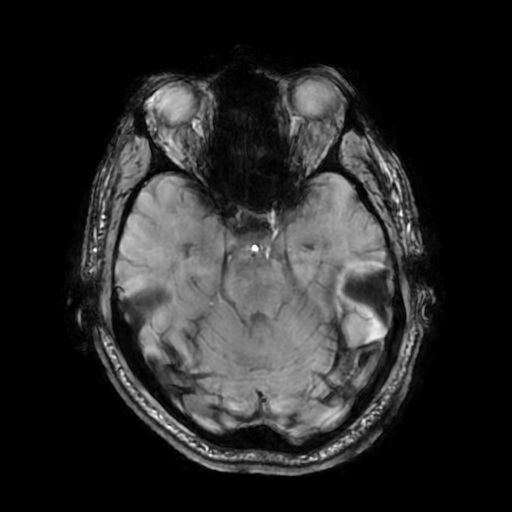
[im 45/104]
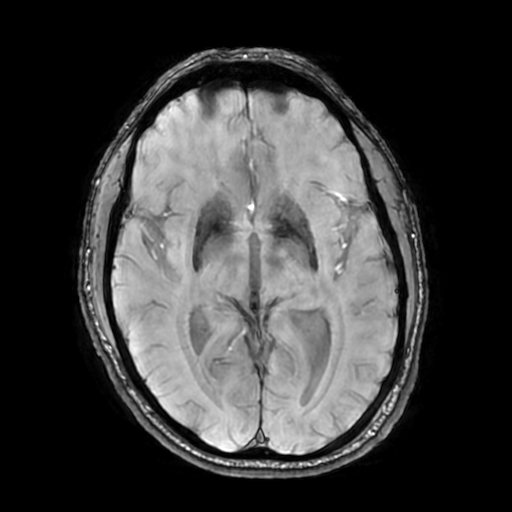
[im 59/104]
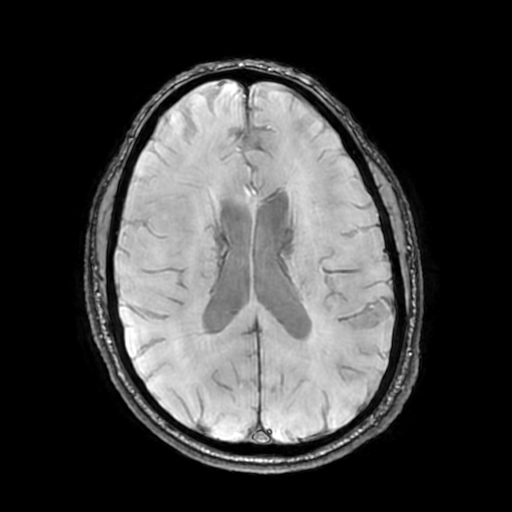

[Series 10: T2 · coronal · 5.0mm · 0.39mm/px · 2 of 32 slices shown (2 of 2)]
[im 1/32]
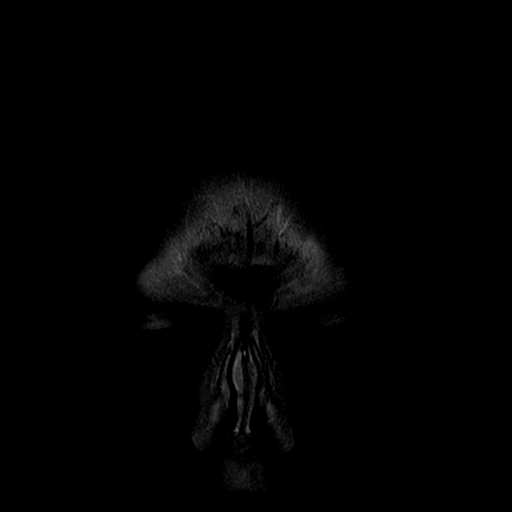
[im 32/32]
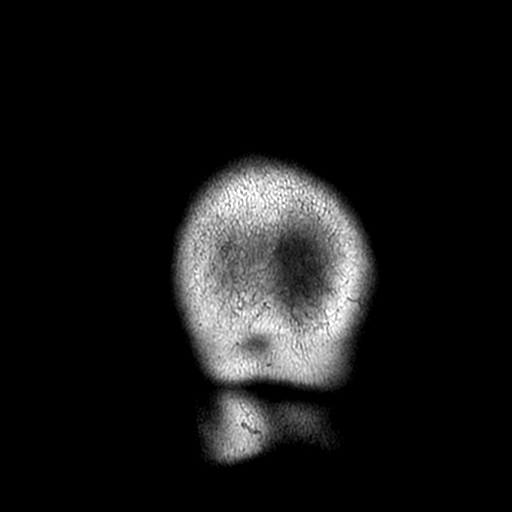

[Series 350: ADC · axial · 3.0mm · 0.94mm/px · z∈[-76,+89]mm · 4 of 52 slices shown (1 of 2)]
[im 1/52]
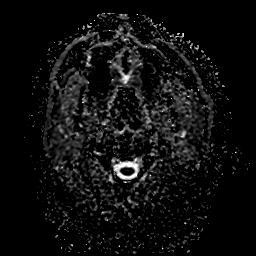
[im 18/52]
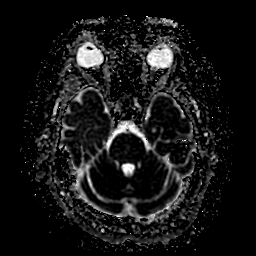
[im 35/52]
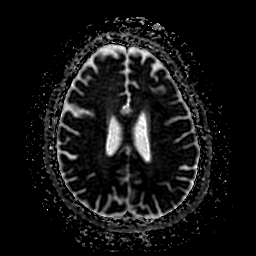
[im 52/52]
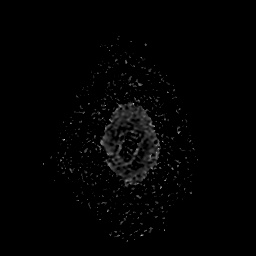

[Series 450: ADC · coronal · 4.0mm · 0.94mm/px · 3 of 37 slices shown (2 of 2)]
[im 1/37]
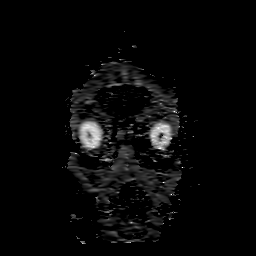
[im 19/37]
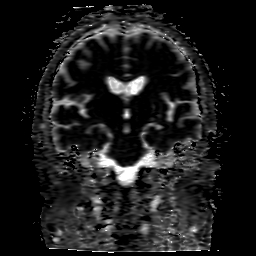
[im 37/37]
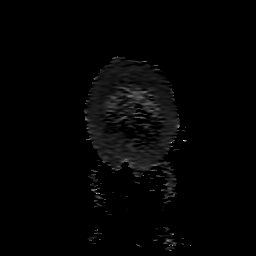

[33 of 48 positions shown; findings below may reference images not displayed]

FINDINGS: Brain: Several scattered punctate foci of diffusion hyperintensity
are present throughout the right frontal and parietal lobes. Lesions
demonstrate variable diffusion on ADC. Foci of infarction
demonstrate associated T2 FLAIR hyperintense signal abnormality.
Cystic change of a single right posterior frontal lesion without
susceptibility blooming (series 8, image 76). No susceptibility
abnormality on the ROBBIE sequence to indicate intracranial
hemorrhage.

No extra-axial collection, hydrocephalus, or effacement of basilar
cisterns.

Vascular: Normal flow voids.

Skull and upper cervical spine: Normal marrow signal.

Sinuses/Orbits: Negative.

Other: None.
IMPRESSION: Several punctate foci of infarction in the right frontal and
parietal lobes of mixed age including recent acute/early subacute
infarctions. No hemorrhage or mass effect.

By: Sungkyun Kostadinov M.D.

## 2019-05-21 DIAGNOSIS — M48062 Spinal stenosis, lumbar region with neurogenic claudication: Secondary | ICD-10-CM | POA: Diagnosis not present

## 2019-05-21 DIAGNOSIS — Z6826 Body mass index (BMI) 26.0-26.9, adult: Secondary | ICD-10-CM | POA: Diagnosis not present

## 2019-05-21 DIAGNOSIS — I10 Essential (primary) hypertension: Secondary | ICD-10-CM | POA: Diagnosis not present

## 2019-05-26 ENCOUNTER — Other Ambulatory Visit: Payer: Self-pay | Admitting: Neurosurgery

## 2019-05-26 DIAGNOSIS — M48062 Spinal stenosis, lumbar region with neurogenic claudication: Secondary | ICD-10-CM

## 2019-06-03 ENCOUNTER — Other Ambulatory Visit: Payer: Self-pay

## 2019-06-03 ENCOUNTER — Ambulatory Visit
Admission: RE | Admit: 2019-06-03 | Discharge: 2019-06-03 | Disposition: A | Discharge status: Home / Self Care | Payer: Medicare Other | Source: Ambulatory Visit | Attending: Neurosurgery | Admitting: Neurosurgery

## 2019-06-03 DIAGNOSIS — M48062 Spinal stenosis, lumbar region with neurogenic claudication: Secondary | ICD-10-CM

## 2019-06-03 DIAGNOSIS — M5126 Other intervertebral disc displacement, lumbar region: Secondary | ICD-10-CM | POA: Diagnosis not present

## 2019-06-03 MED ORDER — GADOBENATE DIMEGLUMINE 529 MG/ML IV SOLN
17.0000 mL | Freq: Once | INTRAVENOUS | Status: AC | PRN
  Administered 2019-06-03: 13:00:00 17 mL via INTRAVENOUS

## 2019-06-04 DIAGNOSIS — H02824 Cysts of left upper eyelid: Secondary | ICD-10-CM | POA: Diagnosis not present

## 2019-06-08 ENCOUNTER — Other Ambulatory Visit (HOSPITAL_COMMUNITY): Payer: Self-pay | Admitting: Cardiovascular Disease

## 2019-06-09 ENCOUNTER — Other Ambulatory Visit (HOSPITAL_COMMUNITY): Payer: Self-pay | Admitting: Cardiovascular Disease

## 2019-06-11 DIAGNOSIS — M48062 Spinal stenosis, lumbar region with neurogenic claudication: Secondary | ICD-10-CM | POA: Diagnosis not present

## 2019-06-20 ENCOUNTER — Other Ambulatory Visit: Payer: Medicare Other

## 2019-06-22 ENCOUNTER — Other Ambulatory Visit: Payer: Self-pay

## 2019-06-22 DIAGNOSIS — I779 Disorder of arteries and arterioles, unspecified: Secondary | ICD-10-CM

## 2019-06-22 DIAGNOSIS — I6521 Occlusion and stenosis of right carotid artery: Secondary | ICD-10-CM

## 2019-06-23 ENCOUNTER — Other Ambulatory Visit: Payer: Self-pay

## 2019-06-23 ENCOUNTER — Ambulatory Visit (INDEPENDENT_AMBULATORY_CARE_PROVIDER_SITE_OTHER)
Admission: RE | Admit: 2019-06-23 | Discharge: 2019-06-23 | Disposition: A | Discharge status: Home / Self Care | Payer: Medicare Other | Source: Ambulatory Visit | Attending: Family | Admitting: Family

## 2019-06-23 ENCOUNTER — Ambulatory Visit (HOSPITAL_COMMUNITY)
Admission: RE | Admit: 2019-06-23 | Discharge: 2019-06-23 | Disposition: A | Discharge status: Home / Self Care | Payer: Medicare Other | Source: Ambulatory Visit | Attending: Family | Admitting: Family

## 2019-06-23 DIAGNOSIS — I779 Disorder of arteries and arterioles, unspecified: Secondary | ICD-10-CM

## 2019-06-24 ENCOUNTER — Encounter: Payer: Self-pay | Admitting: Family

## 2019-06-24 ENCOUNTER — Ambulatory Visit (HOSPITAL_COMMUNITY)
Admission: RE | Admit: 2019-06-24 | Discharge: 2019-06-24 | Disposition: A | Discharge status: Home / Self Care | Payer: Medicare Other | Source: Ambulatory Visit | Attending: Family | Admitting: Family

## 2019-06-24 ENCOUNTER — Ambulatory Visit (INDEPENDENT_AMBULATORY_CARE_PROVIDER_SITE_OTHER): Payer: Medicare Other | Admitting: Family

## 2019-06-24 ENCOUNTER — Other Ambulatory Visit: Payer: Self-pay

## 2019-06-24 ENCOUNTER — Encounter: Payer: Self-pay | Admitting: *Deleted

## 2019-06-24 ENCOUNTER — Other Ambulatory Visit: Payer: Self-pay | Admitting: *Deleted

## 2019-06-24 ENCOUNTER — Telehealth: Payer: Self-pay | Admitting: *Deleted

## 2019-06-24 VITALS — BP 139/80 | HR 68 | Temp 97.9°F | Resp 14 | Wt 184.2 lb

## 2019-06-24 DIAGNOSIS — Z95820 Peripheral vascular angioplasty status with implants and grafts: Secondary | ICD-10-CM | POA: Diagnosis not present

## 2019-06-24 DIAGNOSIS — F17200 Nicotine dependence, unspecified, uncomplicated: Secondary | ICD-10-CM | POA: Diagnosis not present

## 2019-06-24 DIAGNOSIS — I779 Disorder of arteries and arterioles, unspecified: Secondary | ICD-10-CM | POA: Diagnosis not present

## 2019-06-24 DIAGNOSIS — I6521 Occlusion and stenosis of right carotid artery: Secondary | ICD-10-CM | POA: Insufficient documentation

## 2019-06-24 DIAGNOSIS — Z9889 Other specified postprocedural states: Secondary | ICD-10-CM

## 2019-06-24 NOTE — Progress Notes (Signed)
[facility] [place] HISTORY AND PHYSICAL   CC: new onset right calf intermittent claudication, history of PAD and extracranial carotid artery stenosis    History of Present Illness:   Scott [name] is a 68 y.o. male who presented to the hospital with several episodes of right-sided visual changes as well as some episodes of perioral tingling and clumsiness in his left arm. A MRI showed several areas of punctate lesions within the right brain consistent with stroke. He had a CT angiogram that showed a high-grade right carotid stenosis. Therefore on 12/07/2017 he underwent right carotid endarterectomy with patch angioplasty by Dr. [name]. Intraoperative findings included a 95% stenosis. He had extensive calcified plaque within the distal common carotid artery and internal carotid artery which extended well up underneath the hypoglossal nerve.  He is also s/pright superficial femoral artery stentingon05/04/2018by Dr. Oneida Alar.  [place] his right calf claudication has resolved; the more he walks the better his legs feel. He indicates mild to moderate bilateral knee pain, had injuries to knee from playing basketball.  He is on Eliquis for atrial fibrillation. He was placed on a short course of Plavix and aspirinperiprocedure but is now off of this.  He is fatigued, feels this is due to his atrial fib, feels his legs are wobbly and weak in the morning, but once he gets going he can walk a couple of miles.He states he will be hospitalized for a Tikosyn trial and cardioversion if needed.   Dr. [name] last evaluated pt on 08-04-18. At that time carotid duplex showed a widely patent endarterectomy site, follow-up ultrasound in 6 months PAD: Normal ankle-brachial indices bilaterally.  Follow-up duplex of stent and ABIs in 6 months.  Dr. Oneida Alar last evaluated pt on 02-21-17.At that timept had2+ posterior tibial pulses bilaterally, absent dorsalis pedis pulse  (known dorsalis pedis occlusion right foot chronic. Dr. Oneida Alar advised pt tocontinue to try to quit smoking. He will remain on his Eliquis for his atrial fibrillation. If this is stopped at any point in the future he would need to be put on Plavix and aspirin. He will follow-up with [place] with a duplex of his stent and bilateral ABIs in 6 months time. He will see our nurse practitioner that office visit.  He has known spinal stenosis which causes him leg pain.He had lumbar disc surgery in December 2019.  He started having weakness in both legs in May 2019, saw Dr. Trenton Gammon for this.  He had a cardiac ablation in [place] in November 2019.   He started having right calf claudication with walking 2 weeks ago, this is not worsening, no rest pain.   He had a mild MI in 1996.  Diabetic:yes, pt states last A1C was 7.?, last on file was 11.8 on 09-03-18 Tobacco [id number] smoker, 3/4 ppd (started in 1972)  Pt meds include: Statin :Yes Betablocker:No ASA:No Other anticoagulants/antiplatelets:Eliquis for atrial fib    Current Outpatient Medications  Medication Sig Dispense Refill  . amLODipine (NORVASC) 10 MG tablet Take 10 mg by mouth at bedtime.     Marland Kitchen atorvastatin (LIPITOR) 40 MG tablet Take 1 tablet (40 mg total) by mouth daily. 30 tablet 0  . Carboxymethylcellul-Glycerin (REFRESH OPTIVE OP) Place 1 drop into both eyes every morning.     . chlorthalidone (HYGROTON) 25 MG tablet Take 25 mg by mouth daily.     Marland Kitchen ELIQUIS 5 MG TABS tablet TAKE 1 TABLET(5 MG) BY MOUTH TWICE DAILY 60 tablet 3  . fenofibrate (  TRICOR) 145 MG tablet Take 145 mg by mouth daily.    Marland Kitchen HYDROcodone-acetaminophen (NORCO/VICODIN) 5-325 MG tablet Take 1-2 tablets by mouth every 6 (six) hours as needed (for pain). (Patient taking differently: Take 1 tablet by mouth daily as needed for severe pain. ) 8 tablet 0  . metFORMIN (GLUCOPHAGE) 500 MG tablet Take 500 mg by mouth daily with breakfast.    . ondansetron (ZOFRAN)  4 MG tablet Take 4 mg by mouth every 8 (eight) hours as needed for nausea or vomiting.    . potassium chloride (K-DUR) 10 MEQ tablet TAKE 1 TABLET BY MOUTH DAILY 30 tablet 0  . potassium chloride SA (K-DUR,KLOR-CON) 20 MEQ tablet Take 1 tablet (20 mEq total) by mouth 2 (two) times daily. (Patient taking differently: Take 20 mEq by mouth every other day. ) 180 tablet 1  . spironolactone (ALDACTONE) 25 MG tablet Take 0.5 tablets (12.5 mg total) by mouth daily. Please make overdue appt with Dr. Rayann Heman before anymore refills. 1st attempt 15 tablet 0   No current facility-administered medications for this visit.     Past Medical History:  Diagnosis Date  . CAD (coronary artery disease)   . Cerebrovascular disease   . HLD (hyperlipidemia)   . HTN (hypertension)   . IBS (irritable bowel syndrome)   . Lumbar stenosis   . MI, old   . Persistent atrial fibrillation   . PONV (postoperative nausea and vomiting)   . Pre-diabetes   . PVD (peripheral vascular disease) ([place])   . Stroke (cerebrum) ([place])   . Tobacco abuse     Social History Social History   Tobacco Use  . Smoking status: Current Some Day Smoker    Packs/day: 0.50    Years: 43.00    Pack years: 21.50    Types: Cigarettes    Start date: 06/02/1971    Last attempt to quit: 12/09/2017    Years since quitting: 1.5  . Smokeless tobacco: Never Used  . Tobacco comment: pt vapes  Substance Use Topics  . Alcohol use: Yes    Alcohol/week: 0.0 standard drinks    Comment: occasionaly  . Drug use: No    Family History Family History  Problem Relation Age of Onset  . Heart disease Father   . Melanoma Father   . Testicular cancer Father   . Hypertension Mother   . Arthritis Mother   . Arthritis Sister   . Lung disease Neg Hx     Surgical History Past Surgical History:  Procedure Laterality Date  . ABDOMINAL AORTOGRAM W/LOWER EXTREMITY N/A 01/25/2017   Procedure: Abdominal Aortogram w/Lower Extremity;  Surgeon: Elam Dutch, MD;  Location: [facility];  Service: Cardiovascular;  Laterality: N/A;  . ANTERIOR CRUCIATE LIGAMENT REPAIR    . CARDIAC CATHETERIZATION     [place]  . CARDIAC CATHETERIZATION N/A 07/27/2016   Procedure: Left Heart Cath and Coronary Angiography;  Surgeon: Sherren Mocha, MD;  Location: [facility];  Service: Cardiovascular;  Laterality: N/A;  . CARDIOVERSION N/A 06/29/2016   Procedure: CARDIOVERSION;  Surgeon: Fay Records, MD;  Location[facility] ENDOSCOPY;  Service: Cardiovascular;  Laterality: N/A;  . CARDIOVERSION N/A 03/05/2018   Procedure: CARDIOVERSION;  Surgeon: Larey Dresser, MD;  Location: [facility] ENDOSCOPY;  Service: Cardiovascular;  Laterality: N/A;  . CHOLECYSTECTOMY    . ENDARTERECTOMY Right 12/06/2017   Procedure: ENDARTERECTOMY CAROTID RIGHT;  Surgeon: Serafina Mitchell, MD;  Location: [facility];  Service: Vascular;  Laterality: Right;  .  [place], 2000   x 4 times.  . LUMBAR LAMINECTOMY/DECOMPRESSION MICRODISCECTOMY Bilateral 09/09/2018   Procedure: Laminectomy and Foraminotomy bilateral Lumbar three-Lumbar four - Lumbar four-Lumbar five;  Surgeon: Earnie Larsson, MD;  Location: [facility];  Service: Neurosurgery;  Laterality: Bilateral;  . PATCH ANGIOPLASTY Right 12/06/2017   Procedure: PATCH ANGIOPLASTY RIGHT CAROTID ARTERY;  Surgeon: Serafina Mitchell, MD;  Location: [facility];  Service: Vascular;  Laterality: Right;  . PERIPHERAL VASCULAR INTERVENTION Right 01/25/2017   Procedure: Peripheral Vascular Intervention;  Surgeon: Elam Dutch, MD;  Location: [facility];  Service: Cardiovascular;  Laterality: Right;  . TONSILLECTOMY      No Known Allergies  Current Outpatient Medications  Medication Sig Dispense Refill  . amLODipine (NORVASC) 10 MG tablet Take 10 mg by mouth at bedtime.     Marland Kitchen atorvastatin (LIPITOR) 40 MG tablet Take 1 tablet (40 mg total) by mouth daily. 30 tablet 0  . Carboxymethylcellul-Glycerin (REFRESH OPTIVE OP) Place 1 drop into both  eyes every morning.     . chlorthalidone (HYGROTON) 25 MG tablet Take 25 mg by mouth daily.     Marland Kitchen ELIQUIS 5 MG TABS tablet TAKE 1 TABLET(5 MG) BY MOUTH TWICE DAILY 60 tablet 3  . fenofibrate (TRICOR) 145 MG tablet Take 145 mg by mouth daily.    Marland Kitchen HYDROcodone-acetaminophen (NORCO/VICODIN) 5-325 MG tablet Take 1-2 tablets by mouth every 6 (six) hours as needed (for pain). (Patient taking differently: Take 1 tablet by mouth daily as needed for severe pain. ) 8 tablet 0  . metFORMIN (GLUCOPHAGE) 500 MG tablet Take 500 mg by mouth daily with breakfast.    . ondansetron (ZOFRAN) 4 MG tablet Take 4 mg by mouth every 8 (eight) hours as needed for nausea or vomiting.    . potassium chloride (K-DUR) 10 MEQ tablet TAKE 1 TABLET BY MOUTH DAILY 30 tablet 0  . potassium chloride SA (K-DUR,KLOR-CON) 20 MEQ tablet Take 1 tablet (20 mEq total) by mouth 2 (two) times daily. (Patient taking differently: Take 20 mEq by mouth every other day. ) 180 tablet 1  . spironolactone (ALDACTONE) 25 MG tablet Take 0.5 tablets (12.5 mg total) by mouth daily. Please make overdue appt with Dr. Rayann Heman before anymore refills. 1st attempt 15 tablet 0   No current facility-administered medications for this visit.      REVIEW OF SYSTEMS: See HPI for pertinent positives and negatives.  Physical Examination Vitals:   06/24/19 1504  BP: 139/80  Pulse: 68  Resp: 14  Temp: 97.9 F (36.6 C)  TempSrc: Temporal  SpO2: 100%  Weight: 184 lb 3.2 oz (83.6 kg)   Body mass index is 26.43 kg/m.  General:  WDWN in NAD Gait: Normal HENT: WNL Eyes: Pupils equal Pulmonary: normal non-labored breathing, adequate air movement in all fields, CTAB, no rales, rhonchi, or wheezes Cardiac: RRR, no murmur detected Abdomen: soft, NT, no masses palpated Skin: no rashes, no ulcers, no cellulitis.   VASCULAR EXAM  Carotid Bruits Right Left   Negative Negative      Radial pulses are 2+ palpable bilaterally   Adominal aortic pulse is not  palpable                      VASCULAR EXAM: Extremities without ischemic changes, without Gangrene; without open wounds.  LE Pulses Right Left       FEMORAL  2+ palpable  2+ palpable        POPLITEAL  not palpable   not palpable       POSTERIOR TIBIAL  2+ palpable   2+ palpable        DORSALIS PEDIS      ANTERIOR TIBIAL not palpable  2+ palpable     Musculoskeletal: no muscle wasting or atrophy; no peripheral edema  Neurologic:  A&O X 3; appropriate affect, sensation is normal; speech is normal, CN 2-12 intact, pain and light touch intact in extremities, motor exam as listed above. Psychiatric: Normal thought content, mood appropriate to clinical situation. [place]    DATA  Carotid Duplex (06-24-19): Right Carotid: Velocities in the right ICA are consistent with a 1-39% stenosis.                Non-hemodynamically significant plaque <50% noted in the CCA. The                ECA appears <50% stenosed. Patent right carotid endarterectomy                with non-hemodynamically signigicant plaque noted.  Left Carotid: Velocities in the left ICA are consistent with a 40-59% stenosis (low end of range).               Non-hemodynamically significant plaque <50% noted in the CCA. The               ECA appears <50% stenosed.  Vertebrals:  Bilateral vertebral arteries demonstrate antegrade flow. Subclavians: Normal flow hemodynamics were seen in bilateral subclavian              arteries.   Right LE Arterial Duplex (06-23-19): Right Stent(s): +-----------------+--------+---------------+---------+--------+ Mid to Distal SFAPSV cm/sStenosis       Waveform Comments +-----------------+--------+---------------+---------+--------+ Prox to Stent    62                     triphasic         +-----------------+--------+---------------+---------+--------+ Proximal Stent    83                     triphasic         +-----------------+--------+---------------+---------+--------+ Mid Stent        525     50-99% stenosistriphasicWith PST +-----------------+--------+---------------+---------+--------+ Distal Stent     67                     biphasic          +-----------------+--------+---------------+---------+--------+ Distal to Stent  35                     biphasic          +-----------------+--------+---------------+---------+--------+  Summary: Right: 50-99% stenosis within the mid stent with post-stenotic turbulence noted.   Right LE Arterial Duplex (08-04-18): Right Stent(s): +---------------+---++---------++ Prox to Stent  144triphasic +---------------+---++---------++ Proximal Stent 176triphasic +---------------+---++---------++ Mid Stent      171triphasic +---------------+---++---------++ Distal Stent   159triphasic +---------------+---++---------++ Distal to Stent143triphasic +---------------+---++---------++ Summary: Right: Patent stent with no evidence of stenosis in the mid to distal superficial femoral artery stent.     ABI (Date: 06/24/2019): ABI Findings: +---------+------------------+-----+---------+--------+ Right    Rt Pressure (mmHg)IndexWaveform Comment  +---------+------------------+-----+---------+--------+ Brachial 140                    triphasic         +---------+------------------+-----+---------+--------+  PTA      112               0.72 biphasic          +---------+------------------+-----+---------+--------+ DP       104               0.67 triphasic         +---------+------------------+-----+---------+--------+ Great Toe72                0.46 Abnormal          +---------+------------------+-----+---------+--------+  +---------+------------------+-----+---------+-------+ Left     Lt Pressure (mmHg)IndexWaveform  Comment +---------+------------------+-----+---------+-------+ Brachial 155                    triphasic        +---------+------------------+-----+---------+-------+ PTA      153               0.99 triphasic        +---------+------------------+-----+---------+-------+ DP       152               0.98 triphasic        +---------+------------------+-----+---------+-------+ Gardiner Rhyme               0.76 Normal           +---------+------------------+-----+---------+-------+  +-------+-----------+-----------+------------+------------+ ABI/TBIToday's ABIToday's TBIPrevious ABIPrevious TBI +-------+-----------+-----------+------------+------------+ Right  0.72       0.46       1.12        0.78         +-------+-----------+-----------+------------+------------+ Left   0.99       0.76       1.17        0.82         +-------+-----------+-----------+------------+------------+  Right ABIs and TBIs appear decreased compared to prior study on 08/04/2018. Left ABIs and TBIs appear essentially unchanged compared to prior study on 08/04/2018.   Summary: Right: Resting right ankle-brachial index indicates moderate right lower extremity arterial disease. The right toe-brachial index is abnormal. RT great toe pressure = 72 mmHg.  Left: Resting left ankle-brachial index is within normal range. No evidence of significant left lower extremity arterial disease. The left toe-brachial index is normal. LT Great toe pressure = 118 mmHg.   ASSESSMENT:  KAINON DOONEY is a 68 y.o. male s/p right carotid endarterectomy with patch angioplasty by Dr. [name] on 12-07-17. He had a pre-operative stroke, left hand clumsiness, a family friend helped him with PT for his left arm, and he states his arm feels about normal.  He has not had any subsequent stroke or TIA. Apparently he quit smoking for a short while after the right CEA, but resumed.  Carotid duplex today shows  1-39% right ICA stenosis and 40-59% left ICA stenosis (lower end of range).   He is also s/pright superficial femoral artery stentingon05/04/2018by Dr. Oneida Alar. About 2 weeks ago he started having intermittent claudication in his right calf with walking, no rest pain, no tissue loss.  Right LE arterial duplex yesterday shows a 525 cm/s PSV in the mid SFA stent, and right ABI's declined from 100% to 72 %. Left ABI remains normal.     PLAN:   Based on today's exam and non-invasive vascular lab results,and after discussing with Dr. Scot Dock, the patient will be scheduled for an aortogram with lower extremity run off, probable intervention in right SFA stent.   I discussed in depth with the patient the nature  of atherosclerosis, and emphasized the importance of maximal medical management including strict control of blood pressure, blood glucose, and lipid levels, obtaining regular exercise, and cessation of smoking.  The patient is aware that without maximal medical management the underlying atherosclerotic disease process will progress, limiting the benefit of any interventions.  The patient was given information about stroke prevention and what symptoms should prompt the patient to seek immediate medical care.  The patient was given information about PAD including signs, symptoms, treatment, what symptoms should prompt the patient to seek immediate medical care, and risk reduction measures to take.  Thank you for allowing [place] to participate in this patient's care.  [name], RN, MSN, FNP-C [facility] Office: [phone]  Clinic MD: Laqueta Due 06/24/2019 3:10 PM

## 2019-06-24 NOTE — Telephone Encounter (Signed)
Please comment on eliquis. 

## 2019-06-24 NOTE — H&P (View-Only) (Signed)
[facility] [place] HISTORY AND PHYSICAL   CC: new onset right calf intermittent claudication, history of PAD and extracranial carotid artery stenosis    History of Present Illness:   Scott [name] is a 68 y.o. male who presented to the hospital with several episodes of right-sided visual changes as well as some episodes of perioral tingling and clumsiness in his left arm. A MRI showed several areas of punctate lesions within the right brain consistent with stroke. He had a CT angiogram that showed a high-grade right carotid stenosis. Therefore on 12/07/2017 he underwent right carotid endarterectomy with patch angioplasty by Dr. [name]. Intraoperative findings included a 95% stenosis. He had extensive calcified plaque within the distal common carotid artery and internal carotid artery which extended well up underneath the hypoglossal nerve.  He is also s/pright superficial femoral artery stentingon05/04/2018by Dr. Oneida Alar.  [place] his right calf claudication has resolved; the more he walks the better his legs feel. He indicates mild to moderate bilateral knee pain, had injuries to knee from playing basketball.  He is on Eliquis for atrial fibrillation. He was placed on a short course of Plavix and aspirinperiprocedure but is now off of this.  He is fatigued, feels this is due to his atrial fib, feels his legs are wobbly and weak in the morning, but once he gets going he can walk a couple of miles.He states he will be hospitalized for a Tikosyn trial and cardioversion if needed.   Dr. [name] last evaluated pt on 08-04-18. At that time carotid duplex showed a widely patent endarterectomy site, follow-up ultrasound in 6 months PAD: Normal ankle-brachial indices bilaterally.  Follow-up duplex of stent and ABIs in 6 months.  Dr. Oneida Alar last evaluated pt on 02-21-17.At that timept had2+ posterior tibial pulses bilaterally, absent dorsalis pedis pulse  (known dorsalis pedis occlusion right foot chronic. Dr. Oneida Alar advised pt tocontinue to try to quit smoking. He will remain on his Eliquis for his atrial fibrillation. If this is stopped at any point in the future he would need to be put on Plavix and aspirin. He will follow-up with [place] with a duplex of his stent and bilateral ABIs in 6 months time. He will see our nurse practitioner that office visit.  He has known spinal stenosis which causes him leg pain.He had lumbar disc surgery in December 2019.  He started having weakness in both legs in May 2019, saw Dr. Trenton Gammon for this.  He had a cardiac ablation in [place] in November 2019.   He started having right calf claudication with walking 2 weeks ago, this is not worsening, no rest pain.   He had a mild MI in 1996.  Diabetic:yes, pt states last A1C was 7.?, last on file was 11.8 on 09-03-18 Tobacco [id number] smoker, 3/4 ppd (started in 1972)  Pt meds include: Statin :Yes Betablocker:No ASA:No Other anticoagulants/antiplatelets:Eliquis for atrial fib    Current Outpatient Medications  Medication Sig Dispense Refill  . amLODipine (NORVASC) 10 MG tablet Take 10 mg by mouth at bedtime.     Marland Kitchen atorvastatin (LIPITOR) 40 MG tablet Take 1 tablet (40 mg total) by mouth daily. 30 tablet 0  . Carboxymethylcellul-Glycerin (REFRESH OPTIVE OP) Place 1 drop into both eyes every morning.     . chlorthalidone (HYGROTON) 25 MG tablet Take 25 mg by mouth daily.     Marland Kitchen ELIQUIS 5 MG TABS tablet TAKE 1 TABLET(5 MG) BY MOUTH TWICE DAILY 60 tablet 3  . fenofibrate (  TRICOR) 145 MG tablet Take 145 mg by mouth daily.    Marland Kitchen HYDROcodone-acetaminophen (NORCO/VICODIN) 5-325 MG tablet Take 1-2 tablets by mouth every 6 (six) hours as needed (for pain). (Patient taking differently: Take 1 tablet by mouth daily as needed for severe pain. ) 8 tablet 0  . metFORMIN (GLUCOPHAGE) 500 MG tablet Take 500 mg by mouth daily with breakfast.    . ondansetron (ZOFRAN)  4 MG tablet Take 4 mg by mouth every 8 (eight) hours as needed for nausea or vomiting.    . potassium chloride (K-DUR) 10 MEQ tablet TAKE 1 TABLET BY MOUTH DAILY 30 tablet 0  . potassium chloride SA (K-DUR,KLOR-CON) 20 MEQ tablet Take 1 tablet (20 mEq total) by mouth 2 (two) times daily. (Patient taking differently: Take 20 mEq by mouth every other day. ) 180 tablet 1  . spironolactone (ALDACTONE) 25 MG tablet Take 0.5 tablets (12.5 mg total) by mouth daily. Please make overdue appt with Dr. Rayann Heman before anymore refills. 1st attempt 15 tablet 0   No current facility-administered medications for this visit.     Past Medical History:  Diagnosis Date  . CAD (coronary artery disease)   . Cerebrovascular disease   . HLD (hyperlipidemia)   . HTN (hypertension)   . IBS (irritable bowel syndrome)   . Lumbar stenosis   . MI, old   . Persistent atrial fibrillation   . PONV (postoperative nausea and vomiting)   . Pre-diabetes   . PVD (peripheral vascular disease) ([place])   . Stroke (cerebrum) ([place])   . Tobacco abuse     Social History Social History   Tobacco Use  . Smoking status: Current Some Day Smoker    Packs/day: 0.50    Years: 43.00    Pack years: 21.50    Types: Cigarettes    Start date: 06/02/1971    Last attempt to quit: 12/09/2017    Years since quitting: 1.5  . Smokeless tobacco: Never Used  . Tobacco comment: pt vapes  Substance Use Topics  . Alcohol use: Yes    Alcohol/week: 0.0 standard drinks    Comment: occasionaly  . Drug use: No    Family History Family History  Problem Relation Age of Onset  . Heart disease Father   . Melanoma Father   . Testicular cancer Father   . Hypertension Mother   . Arthritis Mother   . Arthritis Sister   . Lung disease Neg Hx     Surgical History Past Surgical History:  Procedure Laterality Date  . ABDOMINAL AORTOGRAM W/LOWER EXTREMITY N/A 01/25/2017   Procedure: Abdominal Aortogram w/Lower Extremity;  Surgeon: Elam Dutch, MD;  Location: [facility];  Service: Cardiovascular;  Laterality: N/A;  . ANTERIOR CRUCIATE LIGAMENT REPAIR    . CARDIAC CATHETERIZATION     [place]  . CARDIAC CATHETERIZATION N/A 07/27/2016   Procedure: Left Heart Cath and Coronary Angiography;  Surgeon: Sherren Mocha, MD;  Location: [facility];  Service: Cardiovascular;  Laterality: N/A;  . CARDIOVERSION N/A 06/29/2016   Procedure: CARDIOVERSION;  Surgeon: Fay Records, MD;  Location: [facility] ENDOSCOPY;  Service: Cardiovascular;  Laterality: N/A;  . CARDIOVERSION N/A 03/05/2018   Procedure: CARDIOVERSION;  Surgeon: Larey Dresser, MD;  Location: [facility] ENDOSCOPY;  Service: Cardiovascular;  Laterality: N/A;  . CHOLECYSTECTOMY    . ENDARTERECTOMY Right 12/06/2017   Procedure: ENDARTERECTOMY CAROTID RIGHT;  Surgeon: Serafina Mitchell, MD;  Location: [facility];  Service: Vascular;  Laterality: Right;  .  [place], 2000   x 4 times.  . LUMBAR LAMINECTOMY/DECOMPRESSION MICRODISCECTOMY Bilateral 09/09/2018   Procedure: Laminectomy and Foraminotomy bilateral Lumbar three-Lumbar four - Lumbar four-Lumbar five;  Surgeon: Earnie Larsson, MD;  Location: [facility];  Service: Neurosurgery;  Laterality: Bilateral;  . PATCH ANGIOPLASTY Right 12/06/2017   Procedure: PATCH ANGIOPLASTY RIGHT CAROTID ARTERY;  Surgeon: Serafina Mitchell, MD;  Location: [place] [facility];  Service: Vascular;  Laterality: Right;  . PERIPHERAL VASCULAR INTERVENTION Right 01/25/2017   Procedure: Peripheral Vascular Intervention;  Surgeon: Elam Dutch, MD;  Location: [facility];  Service: Cardiovascular;  Laterality: Right;  . TONSILLECTOMY      No Known Allergies  Current Outpatient Medications  Medication Sig Dispense Refill  . amLODipine (NORVASC) 10 MG tablet Take 10 mg by mouth at bedtime.     Marland Kitchen atorvastatin (LIPITOR) 40 MG tablet Take 1 tablet (40 mg total) by mouth daily. 30 tablet 0  . Carboxymethylcellul-Glycerin (REFRESH OPTIVE OP) Place 1 drop into both  eyes every morning.     . chlorthalidone (HYGROTON) 25 MG tablet Take 25 mg by mouth daily.     Marland Kitchen ELIQUIS 5 MG TABS tablet TAKE 1 TABLET(5 MG) BY MOUTH TWICE DAILY 60 tablet 3  . fenofibrate (TRICOR) 145 MG tablet Take 145 mg by mouth daily.    Marland Kitchen HYDROcodone-acetaminophen (NORCO/VICODIN) 5-325 MG tablet Take 1-2 tablets by mouth every 6 (six) hours as needed (for pain). (Patient taking differently: Take 1 tablet by mouth daily as needed for severe pain. ) 8 tablet 0  . metFORMIN (GLUCOPHAGE) 500 MG tablet Take 500 mg by mouth daily with breakfast.    . ondansetron (ZOFRAN) 4 MG tablet Take 4 mg by mouth every 8 (eight) hours as needed for nausea or vomiting.    . potassium chloride (K-DUR) 10 MEQ tablet TAKE 1 TABLET BY MOUTH DAILY 30 tablet 0  . potassium chloride SA (K-DUR,KLOR-CON) 20 MEQ tablet Take 1 tablet (20 mEq total) by mouth 2 (two) times daily. (Patient taking differently: Take 20 mEq by mouth every other day. ) 180 tablet 1  . spironolactone (ALDACTONE) 25 MG tablet Take 0.5 tablets (12.5 mg total) by mouth daily. Please make overdue appt with Dr. Rayann Heman before anymore refills. 1st attempt 15 tablet 0   No current facility-administered medications for this visit.      REVIEW OF SYSTEMS: See HPI for pertinent positives and negatives.  Physical Examination Vitals:   06/24/19 1504  BP: 139/80  Pulse: 68  Resp: 14  Temp: 97.9 F (36.6 C)  TempSrc: Temporal  SpO2: 100%  Weight: 184 lb 3.2 oz (83.6 kg)   Body mass index is 26.43 kg/m.  General:  WDWN in NAD Gait: Normal HENT: WNL Eyes: Pupils equal Pulmonary: normal non-labored breathing, adequate air movement in all fields, CTAB, no rales, rhonchi, or wheezes Cardiac: RRR, no murmur detected Abdomen: soft, NT, no masses palpated Skin: no rashes, no ulcers, no cellulitis.   VASCULAR EXAM  Carotid Bruits Right Left   Negative Negative      Radial pulses are 2+ palpable bilaterally   Adominal aortic pulse is not  palpable                      VASCULAR EXAM: Extremities without ischemic changes, without Gangrene; without open wounds.  LE Pulses Right Left       FEMORAL  2+ palpable  2+ palpable        POPLITEAL  not palpable   not palpable       POSTERIOR TIBIAL  2+ palpable   2+ palpable        DORSALIS PEDIS      ANTERIOR TIBIAL not palpable  2+ palpable     Musculoskeletal: no muscle wasting or atrophy; no peripheral edema  Neurologic:  A&O X 3; appropriate affect, sensation is normal; speech is normal, CN 2-12 intact, pain and light touch intact in extremities, motor exam as listed above. Psychiatric: Normal thought content, mood appropriate to clinical situation. Lo[place]    DATA  Carotid Duplex (06-24-19): Right Carotid: Velocities in the right ICA are consistent with a 1-39% stenosis.                Non-hemodynamically significant plaque <50% noted in the CCA. The                ECA appears <50% stenosed. Patent right carotid endarterectomy                with non-hemodynamically signigicant plaque noted.  Left Carotid: Velocities in the left ICA are consistent with a 40-59% stenosis (low end of range).               Non-hemodynamically significant plaque <50% noted in the CCA. The               ECA appears <50% stenosed.  Vertebrals:  Bilateral vertebral arteries demonstrate antegrade flow. Subclavians: Normal flow hemodynamics were seen in bilateral subclavian              arteries.   Right LE Arterial Duplex (06-23-19): Right Stent(s): +-----------------+--------+---------------+---------+--------+ Mid to Distal SFAPSV cm/sStenosis       Waveform Comments +-----------------+--------+---------------+---------+--------+ Prox to Stent    62                     triphasic         +-----------------+--------+---------------+---------+--------+ Proximal Stent    83                     triphasic         +-----------------+--------+---------------+---------+--------+ Mid Stent        525     50-99% stenosistriphasicWith PST +-----------------+--------+---------------+---------+--------+ Distal Stent     67                     biphasic          +-----------------+--------+---------------+---------+--------+ Distal to Stent  35                     biphasic          +-----------------+--------+---------------+---------+--------+  Summary: Right: 50-99% stenosis within the mid stent with post-stenotic turbulence noted.   Right LE Arterial Duplex (08-04-18): Right Stent(s): +---------------+---++---------++ Prox to Stent  144triphasic +---------------+---++---------++ Proximal Stent 176triphasic +---------------+---++---------++ Mid Stent      171triphasic +---------------+---++---------++ Distal Stent   159triphasic +---------------+---++---------++ Distal to Stent143triphasic +---------------+---++---------++ Summary: Right: Patent stent with no evidence of stenosis in the mid to distal superficial femoral artery stent.     ABI (Date: 06/24/2019): ABI Findings: +---------+------------------+-----+---------+--------+ Right    Rt Pressure (mmHg)IndexWaveform Comment  +---------+------------------+-----+---------+--------+ Brachial 140                    triphasic         +---------+------------------+-----+---------+--------+  PTA      112               0.72 biphasic          +---------+------------------+-----+---------+--------+ DP       104               0.67 triphasic         +---------+------------------+-----+---------+--------+ Great Toe72                0.46 Abnormal          +---------+------------------+-----+---------+--------+  +---------+------------------+-----+---------+-------+ Left     Lt Pressure (mmHg)IndexWaveform  Comment +---------+------------------+-----+---------+-------+ Brachial 155                    triphasic        +---------+------------------+-----+---------+-------+ PTA      153               0.99 triphasic        +---------+------------------+-----+---------+-------+ DP       152               0.98 triphasic        +---------+------------------+-----+---------+-------+ Gardiner Rhyme               0.76 Normal           +---------+------------------+-----+---------+-------+  +-------+-----------+-----------+------------+------------+ ABI/TBIToday's ABIToday's TBIPrevious ABIPrevious TBI +-------+-----------+-----------+------------+------------+ Right  0.72       0.46       1.12        0.78         +-------+-----------+-----------+------------+------------+ Left   0.99       0.76       1.17        0.82         +-------+-----------+-----------+------------+------------+  Right ABIs and TBIs appear decreased compared to prior study on 08/04/2018. Left ABIs and TBIs appear essentially unchanged compared to prior study on 08/04/2018.   Summary: Right: Resting right ankle-brachial index indicates moderate right lower extremity arterial disease. The right toe-brachial index is abnormal. RT great toe pressure = 72 mmHg.  Left: Resting left ankle-brachial index is within normal range. No evidence of significant left lower extremity arterial disease. The left toe-brachial index is normal. LT Great toe pressure = 118 mmHg.   ASSESSMENT:  KIYOSHI SCHERF is a 68 y.o. male s/p right carotid endarterectomy with patch angioplasty by Dr. [name] on 12-07-17. He had a pre-operative stroke, left hand clumsiness, a family friend helped him with PT for his left arm, and he states his arm feels about normal.  He has not had any subsequent stroke or TIA. Apparently he quit smoking for a short while after the right CEA, but resumed.  Carotid duplex today shows  1-39% right ICA stenosis and 40-59% left ICA stenosis (lower end of range).   He is also s/pright superficial femoral artery stentingon05/04/2018by Dr. Oneida Alar. About 2 weeks ago he started having intermittent claudication in his right calf with walking, no rest pain, no tissue loss.  Right LE arterial duplex yesterday shows a 525 cm/s PSV in the mid SFA stent, and right ABI's declined from 100% to 72 %. Left ABI remains normal.     PLAN:   Based on today's exam and non-invasive vascular lab results,and after discussing with Dr. Scot Dock, the patient will be scheduled for an aortogram with lower extremity run off, probable intervention in right SFA stent.   I discussed in depth with the patient the nature  of atherosclerosis, and emphasized the importance of maximal medical management including strict control of blood pressure, blood glucose, and lipid levels, obtaining regular exercise, and cessation of smoking.  The patient is aware that without maximal medical management the underlying atherosclerotic disease process will progress, limiting the benefit of any interventions.  The patient was given information about stroke prevention and what symptoms should prompt the patient to seek immediate medical care.  The patient was given information about PAD including signs, symptoms, treatment, what symptoms should prompt the patient to seek immediate medical care, and risk reduction measures to take.  Thank you for allowing [place] to participate in this patient's care.  [name], RN, MSN, FNP-C [facility] Office: [phone]  Clinic MD: Laqueta Due 06/24/2019 3:10 PM

## 2019-06-24 NOTE — Telephone Encounter (Signed)
Request for Surgical Clearance  1. What type of surgery is being performed?    AORTOGRAM POSSIBLE INTERVENTION    2. When is this surgery scheduled? 07/03/2019  3. What type of clearance is required (medical clearance vs. Pharmacy clearance to hold med vs. Both)? PHARMACY FOR MEDICATION HOLD    4. Are there any medications that need to be held prior to surgery and how long?  ELIQUIS X 2 DAYS PRIOR    5. Practice name and name of physician performing surgery?   [facility]    6.  What is your office phone number? [phone]    7. What is your office fax number? (Be sure to include anyone who it needs to go Attn [phone] ATTN. [email or web address]     8. Anesthesia type (None, local, MAC, general)? IV SEDATION    REMINDER TO USER: Remember to please route this message to P [facility] PRE[facility] in a phone note.

## 2019-06-24 NOTE — Patient Instructions (Signed)
Steps to Quit Smoking Smoking tobacco is the leading cause of preventable death. It can affect almost every organ in the body. Smoking puts you and people around you at risk for many serious, long-lasting (chronic) diseases. Quitting smoking can be hard, but it is one of the best things that you can do for your health. It is never too late to quit. How do I get ready to quit? When you decide to quit smoking, make a plan to help you succeed. Before you quit:  Pick a date to quit. Set a date within the next 2 weeks to give you time to prepare.  Write down the reasons why you are quitting. Keep this list in places where you will see it often.  Tell your family, friends, and co-workers that you are quitting. Their support is important.  Talk with your doctor about the choices that may help you quit.  Find out if your health insurance will pay for these treatments.  Know the people, places, things, and activities that make you want to smoke (triggers). Avoid them. What first steps can I take to quit smoking?  Throw away all cigarettes at home, at work, and in your car.  Throw away the things that you use when you smoke, such as ashtrays and lighters.  Clean your car. Make sure to empty the ashtray.  Clean your home, including curtains and carpets. What can I do to help me quit smoking? Talk with your doctor about taking medicines and seeing a counselor at the same time. You are more likely to succeed when you do both.  If you are pregnant or breastfeeding, talk with your doctor about counseling or other ways to quit smoking. Do not take medicine to help you quit smoking unless your doctor tells you to do so. To quit smoking: Quit right away  Quit smoking totally, instead of slowly cutting back on how much you smoke over a period of time.  Go to counseling. You are more likely to quit if you go to counseling sessions regularly. Take medicine You may take medicines to help you quit. Some  medicines need a prescription, and some you can buy over-the-counter. Some medicines may contain a drug called nicotine to replace the nicotine in cigarettes. Medicines may:  Help you to stop having the desire to smoke (cravings).  Help to stop the problems that come when you stop smoking (withdrawal symptoms). Your doctor may ask you to use:  Nicotine patches, gum, or lozenges.  Nicotine inhalers or sprays.  Non-nicotine medicine that is taken by mouth. Find resources Find resources and other ways to help you quit smoking and remain smoke-free after you quit. These resources are most helpful when you use them often. They include:  Online chats with a counselor.  Phone quitlines.  Printed self-help materials.  Support groups or group counseling.  Text messaging programs.  Mobile phone apps. Use apps on your mobile phone or tablet that can help you stick to your quit plan. There are many free apps for mobile phones and tablets as well as websites. Examples include Quit Guide from the CDC and smokefree.gov  What things can I do to make it easier to quit?   Talk to your family and friends. Ask them to support and encourage you.  Call a phone quitline (1-800-QUIT-NOW), reach out to support groups, or work with a counselor.  Ask people who smoke to not smoke around you.  Avoid places that make you want to smoke,   such as: ? Bars. ? Parties. ? Smoke-break areas at work.  Spend time with people who do not smoke.  Lower the stress in your life. Stress can make you want to smoke. Try these things to help your stress: ? Getting regular exercise. ? Doing deep-breathing exercises. ? Doing yoga. ? Meditating. ? Doing a body scan. To do this, close your eyes, focus on one area of your body at a time from head to toe. Notice which parts of your body are tense. Try to relax the muscles in those areas. How will I feel when I quit smoking? Day 1 to 3 weeks Within the first 24 hours,  you may start to have some problems that come from quitting tobacco. These problems are very bad 2-3 days after you quit, but they do not often last for more than 2-3 weeks. You may get these symptoms:  Mood swings.  Feeling restless, nervous, angry, or annoyed.  Trouble concentrating.  Dizziness.  Strong desire for high-sugar foods and nicotine.  Weight gain.  Trouble pooping (constipation).  Feeling like you may vomit (nausea).  Coughing or a sore throat.  Changes in how the medicines that you take for other issues work in your body.  Depression.  Trouble sleeping (insomnia). Week 3 and afterward After the first 2-3 weeks of quitting, you may start to notice more positive results, such as:  Better sense of smell and taste.  Less coughing and sore throat.  Slower heart rate.  Lower blood pressure.  Clearer skin.  Better breathing.  Fewer sick days. Quitting smoking can be hard. Do not give up if you fail the first time. Some people need to try a few times before they succeed. Do your best to stick to your quit plan, and talk with your doctor if you have any questions or concerns. Summary  Smoking tobacco is the leading cause of preventable death. Quitting smoking can be hard, but it is one of the best things that you can do for your health.  When you decide to quit smoking, make a plan to help you succeed.  Quit smoking right away, not slowly over a period of time.  When you start quitting, seek help from your doctor, family, or friends. This information is not intended to replace advice given to you by your health care provider. Make sure you discuss any questions you have with your health care provider. Document Released: 07/07/2009 Document Revised: 11/28/2018 Document Reviewed: 11/29/2018 Elsevier Patient Education  2020 Elsevier Inc.     Stroke Prevention Some medical conditions and lifestyle choices can lead to a higher risk for a stroke. You can  help to prevent a stroke by making nutrition, lifestyle, and other changes. What nutrition changes can be made?   Eat healthy foods. ? Choose foods that are high in fiber. These include:  Fresh fruits.  Fresh vegetables.  Whole grains. ? Eat at least 5 or more servings of fruits and vegetables each day. Try to fill half of your plate at each meal with fruits and vegetables. ? Choose lean protein foods. These include:  Lowfat (lean) cuts of meat.  Chicken without skin.  Fish.  Tofu.  Beans.  Nuts. ? Eat low-fat dairy products. ? Avoid foods that:  Are high in salt (sodium).  Have saturated fat.  Have trans fat.  Have cholesterol.  Are processed.  Are premade.  Follow eating guidelines as told by your doctor. These may include: ? Reducing how many calories you   eat and drink each day. ? Limiting how much salt you eat or drink each day to 1,500 milligrams (mg). ? Using only healthy fats for cooking. These include:  Olive oil.  Canola oil.  Sunflower oil. ? Counting how many carbohydrates you eat and drink each day. What lifestyle changes can be made?  Try to stay at a healthy weight. Talk to your doctor about what a good weight is for you.  Get at least 30 minutes of moderate physical activity at least 5 days a week. This can include: ? Fast walking. ? Biking. ? Swimming.  Do not use any products that have nicotine or tobacco. This includes cigarettes and e-cigarettes. If you need help quitting, ask your doctor. Avoid being around tobacco smoke in general.  Limit how much alcohol you drink to no more than 1 drink a day for nonpregnant women and 2 drinks a day for men. One drink equals 12 oz of beer, 5 oz of wine, or 1 oz of hard liquor.  Do not use drugs.  Avoid taking birth control pills. Talk to your doctor about the risks of taking birth control pills if: ? You are over 35 years old. ? You smoke. ? You get migraines. ? You have had a blood clot.  What other changes can be made?  Manage your cholesterol. ? It is important to eat a healthy diet. ? If your cholesterol cannot be managed through your diet, you may also need to take medicines. Take medicines as told by your doctor.  Manage your diabetes. ? It is important to eat a healthy diet and to exercise regularly. ? If your blood sugar cannot be managed through diet and exercise, you may need to take medicines. Take medicines as told by your doctor.  Control your high blood pressure (hypertension). ? Try to keep your blood pressure below 130/80. This can help lower your risk of stroke. ? It is important to eat a healthy diet and to exercise regularly. ? If your blood pressure cannot be managed through diet and exercise, you may need to take medicines. Take medicines as told by your doctor. ? Ask your doctor if you should check your blood pressure at home. ? Have your blood pressure checked every year. Do this even if your blood pressure is normal.  Talk to your doctor about getting checked for a sleep disorder. Signs of this can include: ? Snoring a lot. ? Feeling very tired.  Take over-the-counter and prescription medicines only as told by your doctor. These may include aspirin or blood thinners (antiplatelets or anticoagulants).  Make sure that any other medical conditions you have are managed. Where to find more information  American Stroke Association: www.strokeassociation.org  National Stroke Association: www.stroke.org Get help right away if:  You have any symptoms of stroke. "BE FAST" is an easy way to remember the main warning signs: ? B - Balance. Signs are dizziness, sudden trouble walking, or loss of balance. ? E - Eyes. Signs are trouble seeing or a sudden change in how you see. ? F - Face. Signs are sudden weakness or loss of feeling of the face, or the face or eyelid drooping on one side. ? A - Arms. Signs are weakness or loss of feeling in an arm. This  happens suddenly and usually on one side of the body. ? S - Speech. Signs are sudden trouble speaking, slurred speech, or trouble understanding what people say. ? T - Time. Time to call emergency   services. Write down what time symptoms started.  You have other signs of stroke, such as: ? A sudden, very bad headache with no known cause. ? Feeling sick to your stomach (nausea). ? Throwing up (vomiting). ? Jerky movements you cannot control (seizure). These symptoms may represent a serious problem that is an emergency. Do not wait to see if the symptoms will go away. Get medical help right away. Call your local emergency services (911 in the [place] Do not drive yourself to the hospital. Summary  You can prevent a stroke by eating healthy, exercising, not smoking, drinking less alcohol, and treating other health problems, such as diabetes, high blood pressure, or high cholesterol.  Do not use any products that contain nicotine or tobacco, such as cigarettes and e-cigarettes.  Get help right away if you have any signs or symptoms of a stroke. This information is not intended to replace advice given to you by your health care provider. Make sure you discuss any questions you have with your health care provider. Document Released: 03/11/2012 Document Revised: 11/06/2018 Document Reviewed: 12/12/2016 Elsevier Patient Education  2020 Elsevier Inc.     Peripheral Vascular Disease  Peripheral vascular disease (PVD) is a disease of the blood vessels that are not part of your heart and brain. A simple term for PVD is poor circulation. In most cases, PVD narrows the blood vessels that carry blood from your heart to the rest of your body. This can reduce the supply of blood to your arms, legs, and internal organs, like your stomach or kidneys. However, PVD most often affects a person's lower legs and feet. Without treatment, PVD tends to get worse. PVD can also lead to acute ischemic limb. This is when  an arm or leg suddenly cannot get enough blood. This is a medical emergency. Follow these instructions at home: Lifestyle  Do not use any products that contain nicotine or tobacco, such as cigarettes and e-cigarettes. If you need help quitting, ask your doctor.  Lose weight if you are overweight. Or, stay at a healthy weight as told by your doctor.  Eat a diet that is low in fat and cholesterol. If you need help, ask your doctor.  Exercise regularly. Ask your doctor for activities that are right for you. General instructions  Take over-the-counter and prescription medicines only as told by your doctor.  Take good care of your feet: ? Wear comfortable shoes that fit well. ? Check your feet often for any cuts or sores.  Keep all follow-up visits as told by your doctor This is important. Contact a doctor if:  You have cramps in your legs when you walk.  You have leg pain when you are at rest.  You have coldness in a leg or foot.  Your skin changes.  You are unable to get or have an erection (erectile dysfunction).  You have cuts or sores on your feet that do not heal. Get help right away if:  Your arm or leg turns cold, numb, and blue.  Your arms or legs become red, warm, swollen, painful, or numb.  You have chest pain.  You have trouble breathing.  You suddenly have weakness in your face, arm, or leg.  You become very confused or you cannot speak.  You suddenly have a very bad headache.  You suddenly cannot see. Summary  Peripheral vascular disease (PVD) is a disease of the blood vessels.  A simple term for PVD is poor circulation. Without treatment, PVD tends   to get worse.  Treatment may include exercise, low fat and low cholesterol diet, and quitting smoking. This information is not intended to replace advice given to you by your health care provider. Make sure you discuss any questions you have with your health care provider. Document Released: 12/05/2009  Document Revised: 08/23/2017 Document Reviewed: 10/18/2016 Elsevier Patient Education  2020 Elsevier Inc.  

## 2019-06-25 ENCOUNTER — Encounter: Payer: Self-pay | Admitting: *Deleted

## 2019-06-25 NOTE — Progress Notes (Unsigned)
Fax sent to [facility] re: Eliquis hold for procedure scheduled for 07/03/2019 hold

## 2019-06-25 NOTE — Telephone Encounter (Signed)
Patient is being followed by [facility] now. I will defer to [facility] cardiology

## 2019-06-25 NOTE — Telephone Encounter (Signed)
   Primary Cardiologist:  Now [facility]  Chart reviewed as part of pre-operative protocol coverage. Per Epic review, it appears the patient now follows with [facility] cardiology. Please reach out to them for guidance on holding anticoagulation.   I will fax to the requesting party and remove from the pool.   [place], PA 06/25/2019, 8:54 AM

## 2019-06-30 ENCOUNTER — Other Ambulatory Visit (HOSPITAL_COMMUNITY)
Admission: RE | Admit: 2019-06-30 | Discharge: 2019-06-30 | Disposition: A | Discharge status: Home / Self Care | Payer: Medicare Other | Source: Ambulatory Visit | Attending: Vascular Surgery | Admitting: Vascular Surgery

## 2019-06-30 DIAGNOSIS — I739 Peripheral vascular disease, unspecified: Secondary | ICD-10-CM | POA: Insufficient documentation

## 2019-06-30 DIAGNOSIS — Z01812 Encounter for preprocedural laboratory examination: Secondary | ICD-10-CM | POA: Diagnosis not present

## 2019-06-30 DIAGNOSIS — Z20828 Contact with and (suspected) exposure to other viral communicable diseases: Secondary | ICD-10-CM | POA: Insufficient documentation

## 2019-07-01 LAB — NOVEL CORONAVIRUS, NAA (HOSP ORDER, SEND-OUT TO REF LAB; TAT 18-24 HRS): SARS-CoV-2, NAA: NOT DETECTED

## 2019-07-03 ENCOUNTER — Other Ambulatory Visit: Payer: Self-pay

## 2019-07-03 ENCOUNTER — Ambulatory Visit (HOSPITAL_COMMUNITY)
Admission: RE | Admit: 2019-07-03 | Discharge: 2019-07-03 | Disposition: A | Discharge status: Home / Self Care | Payer: Medicare Other | Attending: Vascular Surgery | Admitting: Vascular Surgery

## 2019-07-03 ENCOUNTER — Encounter (HOSPITAL_COMMUNITY)
Admission: RE | Disposition: A | Discharge status: Home / Self Care | Payer: Self-pay | Source: Home / Self Care | Attending: Vascular Surgery

## 2019-07-03 DIAGNOSIS — Z8249 Family history of ischemic heart disease and other diseases of the circulatory system: Secondary | ICD-10-CM | POA: Insufficient documentation

## 2019-07-03 DIAGNOSIS — I70213 Atherosclerosis of native arteries of extremities with intermittent claudication, bilateral legs: Secondary | ICD-10-CM | POA: Diagnosis not present

## 2019-07-03 DIAGNOSIS — K589 Irritable bowel syndrome without diarrhea: Secondary | ICD-10-CM | POA: Diagnosis not present

## 2019-07-03 DIAGNOSIS — I10 Essential (primary) hypertension: Secondary | ICD-10-CM | POA: Diagnosis not present

## 2019-07-03 DIAGNOSIS — I2510 Atherosclerotic heart disease of native coronary artery without angina pectoris: Secondary | ICD-10-CM | POA: Diagnosis not present

## 2019-07-03 DIAGNOSIS — Z79899 Other long term (current) drug therapy: Secondary | ICD-10-CM | POA: Diagnosis not present

## 2019-07-03 DIAGNOSIS — Z7901 Long term (current) use of anticoagulants: Secondary | ICD-10-CM | POA: Insufficient documentation

## 2019-07-03 DIAGNOSIS — I252 Old myocardial infarction: Secondary | ICD-10-CM | POA: Diagnosis not present

## 2019-07-03 DIAGNOSIS — I6521 Occlusion and stenosis of right carotid artery: Secondary | ICD-10-CM | POA: Diagnosis not present

## 2019-07-03 DIAGNOSIS — I4891 Unspecified atrial fibrillation: Secondary | ICD-10-CM | POA: Diagnosis not present

## 2019-07-03 DIAGNOSIS — Z7902 Long term (current) use of antithrombotics/antiplatelets: Secondary | ICD-10-CM | POA: Diagnosis not present

## 2019-07-03 DIAGNOSIS — E785 Hyperlipidemia, unspecified: Secondary | ICD-10-CM | POA: Insufficient documentation

## 2019-07-03 DIAGNOSIS — E1151 Type 2 diabetes mellitus with diabetic peripheral angiopathy without gangrene: Secondary | ICD-10-CM | POA: Diagnosis not present

## 2019-07-03 DIAGNOSIS — Z87891 Personal history of nicotine dependence: Secondary | ICD-10-CM | POA: Diagnosis not present

## 2019-07-03 DIAGNOSIS — Z7984 Long term (current) use of oral hypoglycemic drugs: Secondary | ICD-10-CM | POA: Diagnosis not present

## 2019-07-03 DIAGNOSIS — Z8673 Personal history of transient ischemic attack (TIA), and cerebral infarction without residual deficits: Secondary | ICD-10-CM | POA: Diagnosis not present

## 2019-07-03 HISTORY — PX: LOWER EXTREMITY ANGIOGRAPHY: CATH118251

## 2019-07-03 HISTORY — PX: PERIPHERAL VASCULAR INTERVENTION: CATH118257

## 2019-07-03 LAB — POCT I-STAT, CHEM 8
BUN: 25 mg/dL — ABNORMAL HIGH (ref 8–23)
Calcium, Ion: 1.13 mmol/L — ABNORMAL LOW (ref 1.15–1.40)
Chloride: 104 mmol/L (ref 98–111)
Creatinine, Ser: 0.9 mg/dL (ref 0.61–1.24)
Glucose, Bld: 159 mg/dL — ABNORMAL HIGH (ref 70–99)
HCT: 42 % (ref 39.0–52.0)
Hemoglobin: 14.3 g/dL (ref 13.0–17.0)
Potassium: 4.1 mmol/L (ref 3.5–5.1)
Sodium: 138 mmol/L (ref 135–145)
TCO2: 24 mmol/L (ref 22–32)

## 2019-07-03 LAB — POCT ACTIVATED CLOTTING TIME
Activated Clotting Time: 213 seconds
Activated Clotting Time: 208 seconds
Activated Clotting Time: 175 seconds

## 2019-07-03 LAB — GLUCOSE, CAPILLARY: Glucose-Capillary: 173 mg/dL — ABNORMAL HIGH (ref 70–99)

## 2019-07-03 SURGERY — LOWER EXTREMITY ANGIOGRAPHY
Anesthesia: LOCAL | Laterality: Right

## 2019-07-03 MED ORDER — MIDAZOLAM HCL 2 MG/2ML IJ SOLN
INTRAMUSCULAR | Status: AC
  Filled 2019-07-03: qty 2

## 2019-07-03 MED ORDER — HEPARIN (PORCINE) IN NACL 1000-0.9 UT/500ML-% IV SOLN
INTRAVENOUS | Status: DC | PRN
  Administered 2019-07-03 (×2): 500 mL

## 2019-07-03 MED ORDER — HEPARIN (PORCINE) IN NACL 1000-0.9 UT/500ML-% IV SOLN
INTRAVENOUS | Status: AC
  Filled 2019-07-03: qty 1000

## 2019-07-03 MED ORDER — LIDOCAINE HCL (PF) 1 % IJ SOLN
INTRAMUSCULAR | Status: AC
  Filled 2019-07-03: qty 30

## 2019-07-03 MED ORDER — LABETALOL HCL 5 MG/ML IV SOLN: 10.0000 mg | INTRAVENOUS | Status: DC | PRN

## 2019-07-03 MED ORDER — SODIUM CHLORIDE 0.9% FLUSH: 3.0000 mL | INTRAVENOUS | Status: DC | PRN

## 2019-07-03 MED ORDER — SODIUM CHLORIDE 0.9 % IV SOLN: INTRAVENOUS | Status: AC

## 2019-07-03 MED ORDER — ONDANSETRON HCL 4 MG/2ML IJ SOLN: 4.0000 mg | Freq: Four times a day (QID) | INTRAMUSCULAR | Status: DC | PRN

## 2019-07-03 MED ORDER — IODIXANOL 320 MG/ML IV SOLN
INTRAVENOUS | Status: DC | PRN
  Administered 2019-07-03: 09:00:00 165 mL via INTRA_ARTERIAL

## 2019-07-03 MED ORDER — MIDAZOLAM HCL 2 MG/2ML IJ SOLN
INTRAMUSCULAR | Status: DC | PRN
  Administered 2019-07-03: 2 mg via INTRAVENOUS

## 2019-07-03 MED ORDER — SODIUM CHLORIDE 0.9% FLUSH: 3.0000 mL | Freq: Two times a day (BID) | INTRAVENOUS | Status: DC

## 2019-07-03 MED ORDER — LIDOCAINE HCL (PF) 1 % IJ SOLN
INTRAMUSCULAR | Status: DC | PRN
  Administered 2019-07-03: 15 mL via INTRADERMAL

## 2019-07-03 MED ORDER — MORPHINE SULFATE (PF) 2 MG/ML IV SOLN: 2.0000 mg | INTRAVENOUS | Status: DC | PRN

## 2019-07-03 MED ORDER — FENTANYL CITRATE (PF) 100 MCG/2ML IJ SOLN
INTRAMUSCULAR | Status: AC
  Filled 2019-07-03: qty 2

## 2019-07-03 MED ORDER — HYDRALAZINE HCL 20 MG/ML IJ SOLN: 5.0000 mg | INTRAMUSCULAR | Status: DC | PRN

## 2019-07-03 MED ORDER — SODIUM CHLORIDE 0.9 % IV SOLN: 250.0000 mL | INTRAVENOUS | Status: DC | PRN

## 2019-07-03 MED ORDER — OXYCODONE HCL 5 MG PO TABS: 5.0000 mg | ORAL_TABLET | ORAL | Status: DC | PRN

## 2019-07-03 MED ORDER — CLOPIDOGREL BISULFATE 75 MG PO TABS: 75.0000 mg | ORAL_TABLET | Freq: Every day | ORAL | Status: DC

## 2019-07-03 MED ORDER — CLOPIDOGREL BISULFATE 300 MG PO TABS
ORAL_TABLET | ORAL | Status: DC | PRN
  Administered 2019-07-03: 300 mg via ORAL

## 2019-07-03 MED ORDER — HEPARIN SODIUM (PORCINE) 1000 UNIT/ML IJ SOLN
INTRAMUSCULAR | Status: AC
  Filled 2019-07-03: qty 1

## 2019-07-03 MED ORDER — CLOPIDOGREL BISULFATE 75 MG PO TABS: 75.0000 mg | ORAL_TABLET | Freq: Every day | ORAL | 11 refills | Status: DC

## 2019-07-03 MED ORDER — SODIUM CHLORIDE 0.9 % IV SOLN
INTRAVENOUS | Status: DC
  Administered 2019-07-03: 07:00:00 via INTRAVENOUS

## 2019-07-03 MED ORDER — FENTANYL CITRATE (PF) 100 MCG/2ML IJ SOLN
INTRAMUSCULAR | Status: DC | PRN
  Administered 2019-07-03 (×2): 25 ug via INTRAVENOUS

## 2019-07-03 MED ORDER — CLOPIDOGREL BISULFATE 75 MG PO TABS: 300.0000 mg | ORAL_TABLET | Freq: Once | ORAL | Status: DC

## 2019-07-03 MED ORDER — CLOPIDOGREL BISULFATE 300 MG PO TABS
ORAL_TABLET | ORAL | Status: AC
  Filled 2019-07-03: qty 1

## 2019-07-03 MED ORDER — ACETAMINOPHEN 325 MG PO TABS: 650.0000 mg | ORAL_TABLET | ORAL | Status: DC | PRN

## 2019-07-03 MED ORDER — ASPIRIN EC 81 MG PO TBEC: 81.0000 mg | DELAYED_RELEASE_TABLET | Freq: Every day | ORAL | 2 refills | Status: DC

## 2019-07-03 MED ORDER — HEPARIN SODIUM (PORCINE) 1000 UNIT/ML IJ SOLN
INTRAMUSCULAR | Status: DC | PRN
  Administered 2019-07-03: 9000 [IU] via INTRAVENOUS

## 2019-07-03 SURGICAL SUPPLY — 19 items
BALLN LUTONIX DCB 6X80X130 (BALLOONS) ×3
BALLOON LUTONIX DCB 6X80X130 (BALLOONS) ×2 IMPLANT
CATH ANGIO 5F PIGTAIL 65CM (CATHETERS) ×3 IMPLANT
CATH CROSS OVER TEMPO 5F (CATHETERS) ×3 IMPLANT
CATH NAVICROSS ANGLED 90CM (MICROCATHETER) ×3 IMPLANT
CATH SOFT-VU ST 4F 90CM (CATHETERS) ×3 IMPLANT
DEVICE TORQUE .025-.038 (MISCELLANEOUS) ×3 IMPLANT
GUIDEWIRE ANGLED .035X260CM (WIRE) ×3 IMPLANT
KIT ENCORE 26 ADVANTAGE (KITS) ×3 IMPLANT
KIT PV (KITS) ×3 IMPLANT
SHEATH PINNACLE 5F 10CM (SHEATH) ×3 IMPLANT
SHEATH PINNACLE 6F 10CM (SHEATH) ×3 IMPLANT
SHEATH PINNACLE ST 6F 65CM (SHEATH) ×3 IMPLANT
SHEATH PROBE COVER 6X72 (BAG) ×3 IMPLANT
SYR MEDRAD MARK V 150ML (SYRINGE) ×3 IMPLANT
TRANSDUCER W/STOPCOCK (MISCELLANEOUS) ×3 IMPLANT
TRAY PV CATH (CUSTOM PROCEDURE TRAY) ×3 IMPLANT
WIRE BENTSON .035X145CM (WIRE) ×3 IMPLANT
WIRE ROSEN-J .035X260CM (WIRE) ×3 IMPLANT

## 2019-07-03 NOTE — Op Note (Addendum)
Procedure: Ultrasound left groin, aortogram bilateral lower extremity runoff, angioplasty right superficial femoral artery (6 x 80 Lutonix)  Preoperative diagnosis: Claudication  Most operative diagnosis: Same  Anesthesia: Local with IV sedation  Sedation time 65 minutes  Operative findings: #1 calcified vessels mild atherosclerosis to moderate no significant flow-limiting stenosis left leg  2.  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  The patient was placed in supine position on the angios table.  Both groins were prepped and draped in usual sterile fashion.  Ultrasound was used to identify the left common femoral artery and femoral bifurcation.  These were both patent.  Image was obtained for the patient's chart.  Local anesthesia was infiltrated over the left common femoral artery.  Introducer needle was used to cannulate the left common femoral artery and an 035 versa core wire threaded up in abdominal aorta under fluoroscopic guidance.  Next a 5 French sheath placed over the guidewire and left common femoral artery.  This was thoroughly flushed with heparinized saline.  5 French pigtail catheter was advanced over the guidewire into the abdominal aorta and abdominal aortogram was obtained in AP projection.  Infrarenal abdominal aorta is widely patent.  There are 2 renal arteries on the right side.  These are both widely patent.  The left renal artery is widely patent.  The left and right common iliac arteries are calcified but patent.  The right common iliac artery is more calcified than the left.  The right internal iliac artery is diminutive and essentially occluded.  The right external iliac artery is patent.  The left external iliac artery and internal iliac arteries are patent.  Next pigtail catheter is  pulled down above the aortic bifurcation and bilateral oblique views of the pelvis were obtained which confirmed the above findings.  At this point lower extremity runoff views were obtained through the pigtail catheter.  In the left lower extremity the left common femoral profunda femoris superficial femoral popliteal anterior tibial and posterior tibial arteries are all widely patent.  The peroneal artery is patent but heavily diseased and small.  In the right lower extremity, the right common femoral profunda femoris is widely patent.  The right superficial femoral artery is patent.  There is a stent in the midportion of the right superficial femoral artery.  Right in the central portion of the stent there is subtotal occlusion greater than 90% stenosis.  The right popliteal artery is patent.  There is three-vessel runoff to the right foot.  Again the peroneal artery is small and diminutive.  At this point was decided to intervene on the superficial femoral artery stenosis.  The patient had a fairly steep aortic bifurcation.  I initially swapped out the pigtail catheter over the versa core wire and try to get this to engage the right common iliac but could not advance it into the external iliac.  Eventually I was able to take an 035 angled Glidewire and advanced this into the right superficial femoral artery.  We then used an [employer] cross and advanced this up and over the aortic bifurcation and down into the right superficial femoral artery.  The Glidewire was then swapped out for an [employer] wire.  The 5 French sheath was then swapped out for a 7 [place] 65 cm destination sheath which was advanced up and over  the aortic bifurcation and into the mid right superficial femoral artery.  Patient was then given 9000 units of heparin.  ACT was 215.  I then used an 035 angled Glidewire to fairly easily cross the stenosis in the right superficial femoral artery stent.  The Ferd Hibbs cross was then advanced over  the [facility] and this was swapped out for the [employer] wire.  A 6 x 80 Lutonix drug-eluting balloon was then advanced and centered on the lesion.  This was then inflated to 12 atm for 3 minutes.  Completion angiogram showed wide patency of the right superficial femoral artery stent with intact three-vessel runoff to the right foot.  Patient tired procedure well and there were no complications.  The sheath was pulled back up and over the aortic bifurcation and swapped out for a 7 [place] short sheath.  The wire was then removed.  The sheath was thoroughly flushed with heparinized saline.  Patient was taken to the holding area in stable condition.  Operative management: Patient now has a widely patent right superficial femoral artery stent.  He will be given 300 of Plavix on the table and maintained on Plavix daily in addition to his aspirin and statin.  He will follow-up in our office in 1 month with repeat ABIs in our APP clinic.  Ruta Hinds, MD [facility] [place] Office: [phone] Pager: [phone]  Addendum: Will do 81 mg ASA instead of Plavix due to being on [place], MD [facility] [place]: [phone] Pager: [phone]

## 2019-07-03 NOTE — Progress Notes (Signed)
Site area: left groin fa sheath Site Prior to Removal:  Level 0 Pressure Applied For:  20 minutes Manual:   yes Patient Status During Pull:  stable Post Pull Site:  Level  0 Post Pull Instructions Given:  yes Post Pull Pulses Present: left dp dopplered Dressing Applied:  Gauze and tegaderm Bedrest begins @ 1000 Comments:

## 2019-07-03 NOTE — Progress Notes (Signed)
Pt on Eliquis at home Will do ASA 81 mg qd instead of Plavix with Eliquis  Ruta Hinds, MD [facility] [place]: [phone] Pager: [phone]

## 2019-07-03 NOTE — Discharge Instructions (Signed)
NO METFORMIN/GLUCOPHAGE FOR 2 DAYS ° ° °Femoral Site Care °This sheet gives you information about how to care for yourself after your procedure. Your health care provider may also give you more specific instructions. If you have problems or questions, contact your health care provider. °What can I expect after the procedure? °After the procedure, it is common to have: °· Bruising that usually fades within 1-2 weeks. °· Tenderness at the site. °Follow these instructions at home: °Wound care °· Follow instructions from your health care provider about how to take care of your insertion site. Make sure you: °? Wash your hands with soap and water before you change your bandage (dressing). If soap and water are not available, use hand sanitizer. °? Change your dressing as told by your health care provider. °? Leave stitches (sutures), skin glue, or adhesive strips in place. These skin closures may need to stay in place for 2 weeks or longer. If adhesive strip edges start to loosen and curl up, you may trim the loose edges. Do not remove adhesive strips completely unless your health care provider tells you to do that. °· Do not take baths, swim, or use a hot tub until your health care provider approves. °· You may shower 24-48 hours after the procedure or as told by your health care provider. °? Gently wash the site with plain soap and water. °? Pat the area dry with a clean towel. °? Do not rub the site. This may cause bleeding. °· Do not apply powder or lotion to the site. Keep the site clean and dry. °· Check your femoral site every day for signs of infection. Check for: °? Redness, swelling, or pain. °? Fluid or blood. °? Warmth. °? Pus or a bad smell. °Activity °· For the first 2-3 days after your procedure, or as long as directed: °? Avoid climbing stairs as much as possible. °? Do not squat. °· Do not lift anything that is heavier than 10 lb (4.5 kg), or the limit that you are told, until your health care provider  says that it is safe. °· Rest as directed. °? Avoid sitting for a long time without moving. Get up to take short walks every 1-2 hours. °· Do not drive for 24 hours if you were given a medicine to help you relax (sedative). °General instructions °· Take over-the-counter and prescription medicines only as told by your health care provider. °· Keep all follow-up visits as told by your health care provider. This is important. °Contact a health care provider if you have: °· A fever or chills. °· You have redness, swelling, or pain around your insertion site. °Get help right away if: °· The catheter insertion area swells very fast. °· You pass out. °· You suddenly start to sweat or your skin gets clammy. °· The catheter insertion area is bleeding, and the bleeding does not stop when you hold steady pressure on the area. °· The area near or just beyond the catheter insertion site becomes pale, cool, tingly, or numb. °These symptoms may represent a serious problem that is an emergency. Do not wait to see if the symptoms will go away. Get medical help right away. Call your local emergency services (911 in the [place] Do not drive yourself to the hospital. °Summary °· After the procedure, it is common to have bruising that usually fades within 1-2 weeks. °· Check your femoral site every day for signs of infection. °· Do not lift anything that is heavier   than 10 lb (4.5 kg), or the limit that you are told, until your health care provider says that it is safe. °This information is not intended to replace advice given to you by your health care provider. Make sure you discuss any questions you have with your health care provider. °Document Released: 05/14/2014 Document Revised: 09/23/2017 Document Reviewed: 09/23/2017 °Elsevier Patient Education © 2020 [employer]vier Inc[employer] ° ° ° °Moderate Conscious Sedation, Adult, Care After °These instructions provide you with information about caring for yourself after your procedure. Your health  care provider may also give you more specific instructions. Your treatment has been planned according to current medical practices, but problems sometimes occur. Call your health care provider if you have any problems or questions after your procedure. °What can I expect after the procedure? °After your procedure, it is common: °· To feel sleepy for several hours. °· To feel clumsy and have poor balance for several hours. °· To have poor judgment for several hours. °· To vomit if you eat too soon. °Follow these instructions at home: °For at least 24 hours after the procedure: ° °· Do not: °? Participate in activities where you could fall or become injured. °? Drive. °? Use heavy machinery. °? Drink alcohol. °? Take sleeping pills or medicines that cause drowsiness. °? Make important decisions or sign legal documents. °? Take care of children on your own. °· Rest. °Eating and drinking °· Follow the diet recommended by your health care provider. °· If you vomit: °? Drink water, juice, or soup when you can drink without vomiting. °? Make sure you have little or no nausea before eating solid foods. °General instructions °· Have a responsible adult stay with you until you are awake and alert. °· Take over-the-counter and prescription medicines only as told by your health care provider. °· If you smoke, do not smoke without supervision. °· Keep all follow-up visits as told by your health care provider. This is important. °Contact a health care provider if: °· You keep feeling nauseous or you keep vomiting. °· You feel light-headed. °· You develop a rash. °· You have a fever. °Get help right away if: °· You have trouble breathing. °This information is not intended to replace advice given to you by your health care provider. Make sure you discuss any questions you have with your health care provider. °Document Released: 07/01/2013 Document Revised: 08/23/2017 Document Reviewed: 12/31/2015 °Elsevier Patient Education © 2020  Elsevier Inc. ° °

## 2019-07-03 NOTE — Interval H&P Note (Signed)
History and Physical Interval Note:  07/03/2019 7:29 AM  Scott [name]  has presented today for surgery, with the diagnosis of pvd w/ claudication.  The various methods of treatment have been discussed with the patient and family. After consideration of risks, benefits and other options for treatment, the patient has consented to  Procedure(s): LOWER EXTREMITY ANGIOGRAPHY (N/A) as a surgical intervention.  [name] history has been reviewed, patient examined, no change in status, stable for surgery.  I have reviewed the patient's chart and labs.  Questions were answered to the patient's satisfaction.     Ruta Hinds

## 2019-07-06 ENCOUNTER — Encounter (HOSPITAL_COMMUNITY): Payer: Self-pay | Admitting: Vascular Surgery

## 2019-07-14 DIAGNOSIS — I4891 Unspecified atrial fibrillation: Secondary | ICD-10-CM | POA: Diagnosis not present

## 2019-07-14 DIAGNOSIS — Z6826 Body mass index (BMI) 26.0-26.9, adult: Secondary | ICD-10-CM | POA: Diagnosis not present

## 2019-07-20 DIAGNOSIS — M48062 Spinal stenosis, lumbar region with neurogenic claudication: Secondary | ICD-10-CM | POA: Diagnosis not present

## 2019-07-21 ENCOUNTER — Encounter: Payer: Self-pay | Admitting: Vascular Surgery

## 2019-07-27 DIAGNOSIS — Z23 Encounter for immunization: Secondary | ICD-10-CM | POA: Diagnosis not present

## 2019-07-30 ENCOUNTER — Other Ambulatory Visit: Payer: Self-pay

## 2019-07-30 DIAGNOSIS — I779 Disorder of arteries and arterioles, unspecified: Secondary | ICD-10-CM

## 2019-08-05 ENCOUNTER — Telehealth (HOSPITAL_COMMUNITY): Payer: Self-pay | Admitting: *Deleted

## 2019-08-05 NOTE — Telephone Encounter (Signed)

## 2019-08-06 ENCOUNTER — Ambulatory Visit (INDEPENDENT_AMBULATORY_CARE_PROVIDER_SITE_OTHER)
Admit: 2019-08-06 | Discharge: 2019-08-06 | Disposition: A | Discharge status: Home / Self Care | Payer: Medicare Other | Attending: Family | Admitting: Family

## 2019-08-06 ENCOUNTER — Other Ambulatory Visit: Payer: Self-pay

## 2019-08-06 ENCOUNTER — Ambulatory Visit (HOSPITAL_COMMUNITY)
Admission: RE | Admit: 2019-08-06 | Discharge: 2019-08-06 | Disposition: A | Discharge status: Home / Self Care | Payer: Medicare Other | Source: Ambulatory Visit | Attending: Family | Admitting: Family

## 2019-08-06 ENCOUNTER — Ambulatory Visit (INDEPENDENT_AMBULATORY_CARE_PROVIDER_SITE_OTHER): Payer: Medicare Other | Admitting: Physician Assistant

## 2019-08-06 VITALS — BP 147/95 | HR 73 | Temp 98.0°F | Resp 14 | Ht 70.0 in | Wt 178.0 lb

## 2019-08-06 DIAGNOSIS — I70211 Atherosclerosis of native arteries of extremities with intermittent claudication, right leg: Secondary | ICD-10-CM

## 2019-08-06 DIAGNOSIS — I779 Disorder of arteries and arterioles, unspecified: Secondary | ICD-10-CM

## 2019-08-06 DIAGNOSIS — F17200 Nicotine dependence, unspecified, uncomplicated: Secondary | ICD-10-CM

## 2019-08-06 NOTE — Progress Notes (Signed)
Postoperative Visit (Angio)   History of Present Illness   Scott [name] is a 68 y.o. male 1 month status post right SFA stent angioplasty by Dr. Oneida Alar.  Stent was initially placed in May 2018.  Patient developed lifestyle limiting claudication symptoms and was found to have a 90% in-stent stenosis stenosis.  He reports claudication symptoms have resolved.  His main complaint is of his ongoing back pain and weakness however this is a chronic issue for which she follows up with neurosurgery.  He is on Eliquis for atrial fibrillation.  He is also on a aspirin and statin daily.  He is still smoking however is motivated to quit.  Past Medical History, Past Surgical History, Social History, Family History, Medications, Allergies, and Review of Systems are unchanged from previous evaluation.  Current Outpatient Medications  Medication Sig Dispense Refill  . amLODipine (NORVASC) 10 MG tablet Take 10 mg by mouth at bedtime.     Marland Kitchen aspirin EC 81 MG tablet Take 1 tablet (81 mg total) by mouth daily. 150 tablet 2  . atorvastatin (LIPITOR) 40 MG tablet Take 1 tablet (40 mg total) by mouth daily. 30 tablet 0  . Carboxymethylcellul-Glycerin (REFRESH OPTIVE OP) Place 1 drop into both eyes every morning.     . chlorthalidone (HYGROTON) 25 MG tablet Take 25 mg by mouth daily.     Marland Kitchen ELIQUIS 5 MG TABS tablet TAKE 1 TABLET(5 MG) BY MOUTH TWICE DAILY (Patient taking differently: Take 5 mg by mouth 2 (two) times daily. ) 60 tablet 3  . fenofibrate (TRICOR) 145 MG tablet Take 145 mg by mouth daily.    Marland Kitchen HYDROcodone-acetaminophen (NORCO/VICODIN) 5-325 MG tablet Take 1-2 tablets by mouth every 6 (six) hours as needed (for pain). (Patient taking differently: Take 1 tablet by mouth daily as needed for severe pain. ) 8 tablet 0  . losartan (COZAAR) 100 MG tablet Take 100 mg by mouth daily.    . metFORMIN (GLUCOPHAGE) 500 MG tablet Take 500 mg by mouth daily.     . ondansetron (ZOFRAN) 4 MG tablet Take 4 mg by  mouth every 8 (eight) hours as needed for nausea or vomiting.    . potassium chloride (K-DUR) 10 MEQ tablet TAKE 1 TABLET BY MOUTH DAILY (Patient taking differently: Take 10 mEq by mouth daily. ) 30 tablet 0  . potassium chloride SA (K-DUR,KLOR-CON) 20 MEQ tablet Take 1 tablet (20 mEq total) by mouth 2 (two) times daily. (Patient taking differently: Take 20 mEq by mouth daily. ) 180 tablet 1  . spironolactone (ALDACTONE) 25 MG tablet Take 0.5 tablets (12.5 mg total) by mouth daily. Please make overdue appt with Dr. Rayann Heman before anymore refills. 1st attempt (Patient taking differently: Take 12.5 mg by mouth daily. ) 15 tablet 0   No current facility-administered medications for this visit.     ROS: 10 system ROS is negative unless otherwise noted in HPI   For VQI Use Only   PRE-ADM LIVING: Home  AMB STATUS: Ambulatory   Physical Examination   Vitals:   08/06/19 1413  BP: (!) 147/95  Pulse: 73  Resp: 14  Temp: 98 F (36.7 C)  TempSrc: Temporal  SpO2: 100%  Weight: 178 lb (80.7 kg)  Height: 5\' 10"  (1.778 m)   Body mass index is 25.54 kg/m.  General Alert, O x 3, WD, NAD  Pulmonary Sym exp, good B air movt, CTA B  Cardiac RRR, Nl S1, S2,  Vascular Vessel Right Left  Femoral Palpable No hematoma or pseudoaneurysm  Popliteal Not palpable Not palpable  PT Palpable Palpable    Gastrointestinal soft, non-distended, non-tender to palpation,   Musculoskeletal M/S 5/5 throughout  , Extremities without ischemic changes  , No edema present,  Neurologic  Pain and light touch intact in extremities , Motor exam as listed above    Medical Decision Making   Scott [name] is a 68 y.o. male who presents s/p right SFA stent angioplasty.   Claudication symptoms of right lower extremity have resolved since procedure  Arterial duplex demonstrates a widely patent right SFA stent  Right ABI improved to a normal ratio  Encourage smoking cessation  Continue Eliquis, aspirin, and  statin daily  Recheck arterial duplex and ABIs in 6 months per protocol   Dagoberto Ligas PA-C [facility] Office: [phone]  Clinic MD: Dr. Oneida Alar

## 2019-08-11 ENCOUNTER — Other Ambulatory Visit: Payer: Self-pay

## 2019-08-11 DIAGNOSIS — I779 Disorder of arteries and arterioles, unspecified: Secondary | ICD-10-CM

## 2019-08-13 DIAGNOSIS — M25561 Pain in right knee: Secondary | ICD-10-CM | POA: Diagnosis not present

## 2019-08-21 ENCOUNTER — Other Ambulatory Visit (HOSPITAL_COMMUNITY): Payer: Self-pay | Admitting: Cardiovascular Disease

## 2019-09-30 DIAGNOSIS — Z23 Encounter for immunization: Secondary | ICD-10-CM | POA: Diagnosis not present

## 2019-10-15 DIAGNOSIS — F17200 Nicotine dependence, unspecified, uncomplicated: Secondary | ICD-10-CM | POA: Diagnosis not present

## 2019-10-15 DIAGNOSIS — G894 Chronic pain syndrome: Secondary | ICD-10-CM | POA: Diagnosis not present

## 2019-10-15 DIAGNOSIS — I739 Peripheral vascular disease, unspecified: Secondary | ICD-10-CM | POA: Diagnosis not present

## 2019-10-15 DIAGNOSIS — I2510 Atherosclerotic heart disease of native coronary artery without angina pectoris: Secondary | ICD-10-CM | POA: Diagnosis not present

## 2019-10-15 DIAGNOSIS — Z125 Encounter for screening for malignant neoplasm of prostate: Secondary | ICD-10-CM | POA: Diagnosis not present

## 2019-10-15 DIAGNOSIS — I10 Essential (primary) hypertension: Secondary | ICD-10-CM | POA: Diagnosis not present

## 2019-10-15 DIAGNOSIS — Z0000 Encounter for general adult medical examination without abnormal findings: Secondary | ICD-10-CM | POA: Diagnosis not present

## 2019-10-15 DIAGNOSIS — E119 Type 2 diabetes mellitus without complications: Secondary | ICD-10-CM | POA: Diagnosis not present

## 2019-10-15 DIAGNOSIS — I480 Paroxysmal atrial fibrillation: Secondary | ICD-10-CM | POA: Diagnosis not present

## 2019-10-15 DIAGNOSIS — E7800 Pure hypercholesterolemia, unspecified: Secondary | ICD-10-CM | POA: Diagnosis not present

## 2019-10-25 NOTE — Progress Notes (Signed)
Virtual Visit via Telephone Note   This visit type was conducted due to national recommendations for restrictions regarding the COVID-19 Pandemic (e.g. social distancing) in an effort to limit this patient's exposure and mitigate transmission in our community.  Due to his co-morbid illnesses, this patient is at least at moderate risk for complications without adequate follow up.  This format is felt to be most appropriate for this patient at this time.  The patient did not have access to video technology/had technical difficulties with video requiring transitioning to audio format only (telephone).  All issues noted in this document were discussed and addressed.  No physical exam could be performed with this format.  Please refer to the patient's chart for his  consent to telehealth for [facility]   Date:  10/26/2019   ID:  MURRELL LECCE, DOB [birth year], MRN [id number]  Patient Location: Home Provider Location: Home  PCP:  Shirline Frees, MD  Cardiologist:  Mertie Moores, MD   Electrophysiologist:  None  Vascular surgeon:  Dr. Oneida Alar  Evaluation Performed:  Follow-Up Visit  Chief Complaint:  CAD, AFib  Patient profile:   Scott [name] is a 69 y.o. male with   Coronary artery disease  S/p inferior myocardial infarction >> PCI to RCA  Cath 07/2016: RCA stent patent; CTO of small LCx with R-L collats  Persistent atrial fibrillation  S/p DCCV w/ ERAF  Failed Dofetilide  S/p RF ablation with PVI in 07/2018 (Dr. Willis Modena at [facility])  Peripheral arterial disease  S/p stent to the R SFA 01/2017  S/p PTA to R SFA 06/2019  Carotid Artery Dz  S/p R CEA  Diabetes mellitus  Hypertension   Hyperlipidemia  Anxiety  Lumbar stenosis s/p laminotomies 08/2018   Prior cardiac studies: Carotid US 06/24/2019 Bilateral ICA 1-39  Echo 10/31/2016 Mild LVH, EF 55-60, normal wall motion, aortic sclerosis without stenosis, mild MR, moderate LAE, mild RAE, mild TR, PASP 27  Cardiac  catheterization 07/27/2016 LM irregularities LAD proximal 30, mid 40, LCx proximal 100 RCA mid 30, mid to distal stent patent EF 55-65   Myoview 06/27/2016 EF 43, no ischemia or scar   History of Present Illness:   Mr. Pennywell was last seen by Dr. Acie Fredrickson in August 2018.  He has also been followed by Dr. Rayann Heman for his atrial fibrillation.  He has failed cardioversion as well as dofetilide.  He was then seen by Dr. Stark Klein at [facility] and underwent RF ablation with PVI in November 2019.  He was last seen at the [facility] EP clinic in October 2020.  Today, he notes that he is overall doing well.  He has had some issues with his back and underwent surgery at the end of 2019.  He has had a resurgence of leg weakness recently and has been following up with his neurosurgeon.  He also had angioplasty of his right SFA due to restenosis in October 2020.  He has not had any further claudication.  He has not had chest discomfort, orthopnea, leg swelling or syncope.  He does have occasional shortness of breath.  This is overall stable without significant change.   Past Medical History:  Diagnosis Date  . CAD (coronary artery disease)   . Cerebrovascular disease   . HLD (hyperlipidemia)   . HTN (hypertension)   . IBS (irritable bowel syndrome)   . Lumbar stenosis   . MI, old   . Persistent atrial fibrillation ([place])   . PONV (postoperative nausea and  vomiting)   . Pre-diabetes   . PVD (peripheral vascular disease) ([place])   . Stroke (cerebrum) ([place])   . Tobacco abuse    Past Surgical History:  Procedure Laterality Date  . ABDOMINAL AORTOGRAM W/LOWER EXTREMITY N/A 01/25/2017   Procedure: Abdominal Aortogram w/Lower Extremity;  Surgeon: Elam Dutch, MD;  Location: [place] [facility];  Service: Cardiovascular;  Laterality: N/A;  . ANTERIOR CRUCIATE LIGAMENT REPAIR    . CARDIAC CATHETERIZATION     [place]  . CARDIAC CATHETERIZATION N/A 07/27/2016   Procedure: Left Heart Cath and Coronary  Angiography;  Surgeon: Sherren Mocha, MD;  Location: [place] [facility];  Service: Cardiovascular;  Laterality: N/A;  . CARDIOVERSION N/A 06/29/2016   Procedure: CARDIOVERSION;  Surgeon: Fay Records, MD;  Location: [facility] ENDOSCOPY;  Service: Cardiovascular;  Laterality: N/A;  . CARDIOVERSION N/A 03/05/2018   Procedure: CARDIOVERSION;  Surgeon: Larey Dresser, MD;  Location: [facility] ENDOSCOPY;  Service: Cardiovascular;  Laterality: N/A;  . CHOLECYSTECTOMY    . ENDARTERECTOMY Right 12/06/2017   Procedure: ENDARTERECTOMY CAROTID RIGHT;  Surgeon: Serafina Mitchell, MD;  Location: [place];  Service: Vascular;  Laterality: Right;  . [place], 2000   x 4 times.  . LOWER EXTREMITY ANGIOGRAPHY N/A 07/03/2019   Procedure: LOWER EXTREMITY ANGIOGRAPHY;  Surgeon: Elam Dutch, MD;  Location: [place] [facility];  Service: Cardiovascular;  Laterality: N/A;  . LUMBAR LAMINECTOMY/DECOMPRESSION MICRODISCECTOMY Bilateral 09/09/2018   Procedure: Laminectomy and Foraminotomy bilateral Lumbar three-Lumbar four - Lumbar four-Lumbar five;  Surgeon: Earnie Larsson, MD;  Location: [place];  Service: Neurosurgery;  Laterality: Bilateral;  . PATCH ANGIOPLASTY Right 12/06/2017   Procedure: PATCH ANGIOPLASTY RIGHT CAROTID ARTERY;  Surgeon: Serafina Mitchell, MD;  Location: [place];  Service: Vascular;  Laterality: Right;  . PERIPHERAL VASCULAR INTERVENTION Right 01/25/2017   Procedure: Peripheral Vascular Intervention;  Surgeon: Elam Dutch, MD;  Location: [place] [facility];  Service: Cardiovascular;  Laterality: Right;  . PERIPHERAL VASCULAR INTERVENTION Right 07/03/2019   Procedure: PERIPHERAL VASCULAR INTERVENTION;  Surgeon: Elam Dutch, MD;  Location: [place] CV [facility];  Service: Cardiovascular;  Laterality: Right;  . TONSILLECTOMY       Current Meds  Medication Sig  . amLODipine (NORVASC) 10 MG tablet Take 10 mg by mouth at bedtime.   Marland Kitchen atorvastatin (LIPITOR) 40 MG tablet Take 1 tablet (40 mg  total) by mouth daily.  . Carboxymethylcellul-Glycerin (REFRESH OPTIVE OP) Place 1 drop into both eyes every morning.   Marland Kitchen ELIQUIS 5 MG TABS tablet TAKE 1 TABLET(5 MG) BY MOUTH TWICE DAILY  . fenofibrate (TRICOR) 145 MG tablet Take 145 mg by mouth daily.  Marland Kitchen HYDROcodone-acetaminophen (NORCO/VICODIN) 5-325 MG tablet Take 1-2 tablets by mouth every 6 (six) hours as needed (for pain).  Marland Kitchen losartan (COZAAR) 100 MG tablet Take 100 mg by mouth daily.  . metFORMIN (GLUCOPHAGE) 500 MG tablet Take 500 mg by mouth 2 (two) times daily with a meal.   . ondansetron (ZOFRAN) 4 MG tablet Take 4 mg by mouth every 8 (eight) hours as needed for nausea or vomiting.  . potassium chloride (KLOR-CON) 10 MEQ tablet Take 10 mEq by mouth every other day.  . spironolactone (ALDACTONE) 25 MG tablet Take 25 mg by mouth daily.     Allergies:   Augmentin [amoxicillin-pot clavulanate]   Social History   Tobacco Use  . Smoking status: Current Some Day Smoker    Packs/day: 0.50    Years:  43.00    Pack years: 21.50    Types: Cigarettes    Start date: 06/02/1971    Last attempt to quit: 12/09/2017    Years since quitting: 1.8  . Smokeless tobacco: Never Used  . Tobacco comment: pt vapes  Substance Use Topics  . Alcohol use: Yes    Alcohol/week: 0.0 standard drinks    Comment: occasionaly  . Drug use: No     Family Hx: [name] family history includes Arthritis in his mother and sister; Heart disease in his father; Hypertension in his mother; Melanoma in his father; Testicular cancer in his father. There is no history of Lung disease.  ROS:   Please see the history of present illness.    +Leg weakness related to back issues No snoring No claudication    Labs/Other Tests and Data Reviewed:    EKG:  No ECG reviewed.  Recent Labs: 07/03/2019: BUN 25; Creatinine, Ser 0.90; Hemoglobin 14.3; Potassium 4.1; Sodium 138   Recent Lipid Panel Lab Results  Component Value Date/Time   CHOL 125 12/05/2017 08:58 AM    CHOL 146 10/23/2017 09:33 AM   TRIG 51 12/05/2017 08:58 AM   HDL 40 (L) 12/05/2017 08:58 AM   HDL 37 (L) 10/23/2017 09:33 AM   CHOLHDL 3.1 12/05/2017 08:58 AM   LDLCALC 75 12/05/2017 08:58 AM   LDLCALC 88 10/23/2017 09:33 AM     Wt Readings from Last 3 Encounters:  10/26/19 181 lb (82.1 kg)  08/06/19 178 lb (80.7 kg)  07/03/19 185 lb (83.9 kg)     Objective:    Vital Signs:  Ht 5\' 9"  (1.753 m)   Wt 181 lb (82.1 kg)   BMI 26.73 kg/m    VITAL SIGNS:  reviewed GEN:  no acute distress RESPIRATORY:  no labored breathing NEURO:  alert and oriented PSYCH:  normal mood  CHA2DS2-VASc Score = 4 [name] score is based upon: CHF History: No HTN History: Yes Age : 70-74 Diabetes History: Yes Stroke History: No Vascular Disease History: Yes Gender: Male      ASSESSMENT & PLAN:    1. Coronary artery disease involving native coronary artery of native heart without angina pectoris Prior hx of inferior myocardial infarction s/p PCI to RCA.  Cath in 2017 demonstrated a patent RCA stent and chronic occlusion of a small LCx, managed medically.  He is doing well without anginal symptoms.  Continue statin Rx.  He is not on ASA as he is on Apixaban.  Follow up in 6 mos.  2. Persistent atrial fibrillation (HCC) History of PVI ablation with Dr. Willis Modena at [facility] November 2019.  He has been maintaining sinus rhythm.  He has a Kardia mobile device which has not given him any notifications of atrial fibrillation.  3. PAOD (peripheral arterial occlusive disease) ([place]) History of stenting to the right SFA and right CEA.  He recently underwent angioplasty to the right SFA due to restenosis.  He is managed by vascular surgery.  Continue statin therapy.  4. Essential hypertension Blood pressure readings at his PCPs office as well as Dr. Candiss Norse were optimal.  Continue current dose of amlodipine, losartan, spironolactone.  5. Type 2 diabetes mellitus with complication, without long-term current  use of insulin (HCC) Uncontrolled.  Recent A1c 8.4.  Continue follow-up with primary care.  6. Hyperlipidemia, unspecified hyperlipidemia type Recent LDL above goal at 113.  He did admit to dietary indiscretion.  I recommended that he work on improving diet.  We will  arrange a follow-up fasting lipid panel in 3 months.  If his LDL remains above goal, consider changing atorvastatin to rosuvastatin.  7. Tobacco abuse We discussed the importance of quitting and different strategies for quitting.  Time:   Today, I have spent 29 minutes with the patient with telehealth technology discussing the above problems.     Medication Adjustments/Labs and Tests Ordered: Current medicines are reviewed at length with the patient today.  Concerns regarding medicines are outlined above.   Tests Ordered: Orders Placed This Encounter  Procedures  . Lipid panel    Medication Changes: No orders of the defined types were placed in this encounter.   Follow Up:  In Person in 6 month(s)  Signed, [name], PA-C  10/26/2019 10:35 AM    [place]

## 2019-10-26 ENCOUNTER — Telehealth (INDEPENDENT_AMBULATORY_CARE_PROVIDER_SITE_OTHER): Payer: Medicare Other | Admitting: Physician Assistant

## 2019-10-26 ENCOUNTER — Encounter: Payer: Self-pay | Admitting: Physician Assistant

## 2019-10-26 ENCOUNTER — Other Ambulatory Visit: Payer: Self-pay

## 2019-10-26 VITALS — Ht 69.0 in | Wt 181.0 lb

## 2019-10-26 DIAGNOSIS — E785 Hyperlipidemia, unspecified: Secondary | ICD-10-CM

## 2019-10-26 DIAGNOSIS — Z720 Tobacco use: Secondary | ICD-10-CM

## 2019-10-26 DIAGNOSIS — I10 Essential (primary) hypertension: Secondary | ICD-10-CM

## 2019-10-26 DIAGNOSIS — I4819 Other persistent atrial fibrillation: Secondary | ICD-10-CM

## 2019-10-26 DIAGNOSIS — I2510 Atherosclerotic heart disease of native coronary artery without angina pectoris: Secondary | ICD-10-CM | POA: Diagnosis not present

## 2019-10-26 DIAGNOSIS — E118 Type 2 diabetes mellitus with unspecified complications: Secondary | ICD-10-CM

## 2019-10-26 DIAGNOSIS — I779 Disorder of arteries and arterioles, unspecified: Secondary | ICD-10-CM

## 2019-10-26 NOTE — Patient Instructions (Signed)
Medication Instructions:   Your physician recommends that you continue on your current medications as directed. Please refer to the Current Medication list given to you today.  *If you need a refill on your cardiac medications before your next appointment, please call your pharmacy*  Lab Work:  Your physician recommends that you return for lab work in 3 month  If you have labs (blood work) drawn today and your tests are completely normal, you will receive your results only by: Marland Kitchen MyChart Message (if you have MyChart) OR . A paper copy in the mail If you have any lab test that is abnormal or we need to change your treatment, we will call you to review the results.  Testing/Procedures:  None ordered  Follow-Up: At [facility], you and your health needs are our priority.  As part of our continuing mission to provide you with exceptional heart care, we have created designated Provider Care Teams.  These Care Teams include your primary Cardiologist (physician) and Advanced Practice Providers (APPs -  Physician Assistants and Nurse Practitioners) who all work together to provide you with the care you need, when you need it.  Your next appointment:   6 month(s)  The format for your next appointment:   In Person  Provider:   You may see Mertie Moores, MD or one of the following Advanced Practice Providers on your designated Care Team:    [name], PA-C  [place], [place]  Daune Perch, NP   Other Instructions   Fat and Cholesterol Restricted Eating Plan Getting too much fat and cholesterol in your diet may cause health problems. Choosing the right foods helps keep your fat and cholesterol at normal levels. This can keep you from getting certain diseases. Your doctor may recommend an eating plan that includes:  Total fat: ______% or less of total calories a day.  Saturated fat: ______% or less of total calories a day.  Cholesterol: less than _________mg a day.  Fiber:  ______g a day. What are tips for following this plan? Meal planning  At meals, divide your plate into four equal parts: ? Fill one-half of your plate with vegetables and green salads. ? Fill one-fourth of your plate with whole grains. ? Fill one-fourth of your plate with low-fat (lean) protein foods.  Eat fish that is high in omega-3 fats at least two times a week. This includes mackerel, tuna, sardines, and salmon.  Eat foods that are high in fiber, such as whole grains, beans, apples, broccoli, carrots, peas, and barley. General tips   Work with your doctor to lose weight if you need to.  Avoid: ? Foods with added sugar. ? Fried foods. ? Foods with partially hydrogenated oils.  Limit alcohol intake to no more than 1 drink a day for nonpregnant women and 2 drinks a day for men. One drink equals 12 oz of beer, 5 oz of wine, or 1 oz of hard liquor. Reading food labels  Check food labels for: ? Trans fats. ? Partially hydrogenated oils. ? Saturated fat (g) in each serving. ? Cholesterol (mg) in each serving. ? Fiber (g) in each serving.  Choose foods with healthy fats, such as: ? Monounsaturated fats. ? Polyunsaturated fats. ? Omega-3 fats.  Choose grain products that have whole grains. Look for the word "whole" as the first word in the ingredient list. Cooking  Cook foods using low-fat methods. These include baking, boiling, grilling, and broiling.  Eat more home-cooked foods. Eat at restaurants and  buffets less often.  Avoid cooking using saturated fats, such as butter, cream, palm oil, palm kernel oil, and coconut oil. Recommended foods  Fruits  All fresh, canned (in natural juice), or frozen fruits. Vegetables  Fresh or frozen vegetables (raw, steamed, roasted, or grilled). Green salads. Grains  Whole grains, such as whole wheat or whole grain breads, crackers, cereals, and pasta. Unsweetened oatmeal, bulgur, barley, quinoa, or brown rice. Corn or whole wheat  flour tortillas. Meats and other protein foods  Ground beef (85% or leaner), grass-fed beef, or beef trimmed of fat. Skinless chicken or [place]. Ground chicken or [place]. Pork trimmed of fat. All fish and seafood. Egg whites. Dried beans, peas, or lentils. Unsalted nuts or seeds. Unsalted canned beans. Nut butters without added sugar or oil. Dairy  Low-fat or nonfat dairy products, such as skim or 1% milk, 2% or reduced-fat cheeses, low-fat and fat-free ricotta or cottage cheese, or plain low-fat and nonfat yogurt. Fats and oils  Tub margarine without trans fats. Light or reduced-fat mayonnaise and salad dressings. Avocado. Olive, canola, sesame, or safflower oils. The items listed above may not be a complete list of foods and beverages you can eat. Contact a dietitian for more information. Foods to avoid Fruits  Canned fruit in heavy syrup. Fruit in cream or butter sauce. Fried fruit. Vegetables  Vegetables cooked in cheese, cream, or butter sauce. Fried vegetables. Grains  White bread. White pasta. White rice. Cornbread. Bagels, pastries, and croissants. Crackers and snack foods that contain trans fat and hydrogenated oils. Meats and other protein foods  Fatty cuts of meat. Ribs, chicken wings, bacon, sausage, bologna, salami, chitterlings, fatback, hot dogs, bratwurst, and packaged lunch meats. Liver and organ meats. Whole eggs and egg yolks. Chicken and [place] with skin. Fried meat. Dairy  Whole or 2% milk, cream, half-and-half, and cream cheese. Whole milk cheeses. Whole-fat or sweetened yogurt. Full-fat cheeses. Nondairy creamers and whipped toppings. Processed cheese, cheese spreads, and cheese curds. Beverages  Alcohol. Sugar-sweetened drinks such as sodas, lemonade, and fruit drinks. Fats and oils  Butter, stick margarine, lard, shortening, ghee, or bacon fat. Coconut, palm kernel, and palm oils. Sweets and desserts  Corn syrup, sugars, honey, and molasses. Candy. Jam and  jelly. Syrup. Sweetened cereals. Cookies, pies, cakes, donuts, muffins, and ice cream. The items listed above may not be a complete list of foods and beverages you should avoid. Contact a dietitian for more information. Summary  Choosing the right foods helps keep your fat and cholesterol at normal levels. This can keep you from getting certain diseases.  At meals, fill one-half of your plate with vegetables and green salads.  Eat high-fiber foods, like whole grains, beans, apples, carrots, peas, and barley.  Limit added sugar, saturated fats, alcohol, and fried foods. This information is not intended to replace advice given to you by your health care provider. Make sure you discuss any questions you have with your health care provider. Document Revised: 05/14/2018 Document Reviewed: 05/28/2017 Elsevier Patient Education  [place].

## 2019-11-23 DIAGNOSIS — M79645 Pain in left finger(s): Secondary | ICD-10-CM | POA: Diagnosis not present

## 2019-11-23 DIAGNOSIS — M20012 Mallet finger of left finger(s): Secondary | ICD-10-CM | POA: Diagnosis not present

## 2019-11-26 DIAGNOSIS — M20012 Mallet finger of left finger(s): Secondary | ICD-10-CM | POA: Diagnosis not present

## 2019-12-17 DIAGNOSIS — M79645 Pain in left finger(s): Secondary | ICD-10-CM | POA: Diagnosis not present

## 2019-12-19 ENCOUNTER — Other Ambulatory Visit: Payer: Self-pay | Admitting: Internal Medicine

## 2019-12-21 DIAGNOSIS — M79605 Pain in left leg: Secondary | ICD-10-CM | POA: Diagnosis not present

## 2019-12-21 DIAGNOSIS — M47812 Spondylosis without myelopathy or radiculopathy, cervical region: Secondary | ICD-10-CM | POA: Diagnosis not present

## 2019-12-21 DIAGNOSIS — M5030 Other cervical disc degeneration, unspecified cervical region: Secondary | ICD-10-CM | POA: Diagnosis not present

## 2019-12-21 DIAGNOSIS — R29898 Other symptoms and signs involving the musculoskeletal system: Secondary | ICD-10-CM | POA: Diagnosis not present

## 2019-12-21 DIAGNOSIS — Z720 Tobacco use: Secondary | ICD-10-CM | POA: Diagnosis not present

## 2019-12-21 DIAGNOSIS — M549 Dorsalgia, unspecified: Secondary | ICD-10-CM | POA: Diagnosis not present

## 2019-12-21 DIAGNOSIS — M542 Cervicalgia: Secondary | ICD-10-CM | POA: Diagnosis not present

## 2019-12-21 DIAGNOSIS — M79604 Pain in right leg: Secondary | ICD-10-CM | POA: Diagnosis not present

## 2019-12-21 DIAGNOSIS — M4316 Spondylolisthesis, lumbar region: Secondary | ICD-10-CM | POA: Diagnosis not present

## 2019-12-21 NOTE — Telephone Encounter (Signed)
Prescription refill request for Eliquis received.  Last office visit: Kathlen Mody 10/26/2019 Scr: 1.06, 10/15/2019 Age: 69 y.o. Weight: 82.1 kg   Prescription refill sent.

## 2019-12-30 DIAGNOSIS — H2513 Age-related nuclear cataract, bilateral: Secondary | ICD-10-CM | POA: Diagnosis not present

## 2019-12-31 DIAGNOSIS — M5417 Radiculopathy, lumbosacral region: Secondary | ICD-10-CM | POA: Insufficient documentation

## 2019-12-31 DIAGNOSIS — M79604 Pain in right leg: Secondary | ICD-10-CM | POA: Diagnosis not present

## 2019-12-31 DIAGNOSIS — R29898 Other symptoms and signs involving the musculoskeletal system: Secondary | ICD-10-CM | POA: Diagnosis not present

## 2019-12-31 DIAGNOSIS — M79605 Pain in left leg: Secondary | ICD-10-CM | POA: Diagnosis not present

## 2020-01-18 DIAGNOSIS — M79645 Pain in left finger(s): Secondary | ICD-10-CM | POA: Diagnosis not present

## 2020-01-28 DIAGNOSIS — E119 Type 2 diabetes mellitus without complications: Secondary | ICD-10-CM | POA: Diagnosis not present

## 2020-02-02 DIAGNOSIS — M2560 Stiffness of unspecified joint, not elsewhere classified: Secondary | ICD-10-CM | POA: Diagnosis not present

## 2020-02-02 DIAGNOSIS — M545 Low back pain: Secondary | ICD-10-CM | POA: Diagnosis not present

## 2020-02-02 DIAGNOSIS — M6281 Muscle weakness (generalized): Secondary | ICD-10-CM | POA: Diagnosis not present

## 2020-02-11 DIAGNOSIS — M1990 Unspecified osteoarthritis, unspecified site: Secondary | ICD-10-CM | POA: Diagnosis not present

## 2020-02-11 DIAGNOSIS — E119 Type 2 diabetes mellitus without complications: Secondary | ICD-10-CM | POA: Diagnosis not present

## 2020-02-11 DIAGNOSIS — I2510 Atherosclerotic heart disease of native coronary artery without angina pectoris: Secondary | ICD-10-CM | POA: Diagnosis not present

## 2020-02-11 DIAGNOSIS — I10 Essential (primary) hypertension: Secondary | ICD-10-CM | POA: Diagnosis not present

## 2020-02-11 DIAGNOSIS — I480 Paroxysmal atrial fibrillation: Secondary | ICD-10-CM | POA: Diagnosis not present

## 2020-02-11 DIAGNOSIS — E7800 Pure hypercholesterolemia, unspecified: Secondary | ICD-10-CM | POA: Diagnosis not present

## 2020-02-11 DIAGNOSIS — M170 Bilateral primary osteoarthritis of knee: Secondary | ICD-10-CM | POA: Diagnosis not present

## 2020-02-15 DIAGNOSIS — M5417 Radiculopathy, lumbosacral region: Secondary | ICD-10-CM | POA: Diagnosis not present

## 2020-02-18 DIAGNOSIS — I10 Essential (primary) hypertension: Secondary | ICD-10-CM | POA: Diagnosis not present

## 2020-03-08 ENCOUNTER — Other Ambulatory Visit: Payer: Self-pay | Admitting: *Deleted

## 2020-03-08 DIAGNOSIS — Z95820 Peripheral vascular angioplasty status with implants and grafts: Secondary | ICD-10-CM

## 2020-03-08 DIAGNOSIS — I779 Disorder of arteries and arterioles, unspecified: Secondary | ICD-10-CM

## 2020-03-14 ENCOUNTER — Ambulatory Visit (INDEPENDENT_AMBULATORY_CARE_PROVIDER_SITE_OTHER): Payer: Medicare Other | Admitting: Physician Assistant

## 2020-03-14 ENCOUNTER — Other Ambulatory Visit: Payer: Self-pay

## 2020-03-14 ENCOUNTER — Ambulatory Visit (INDEPENDENT_AMBULATORY_CARE_PROVIDER_SITE_OTHER)
Admission: RE | Admit: 2020-03-14 | Discharge: 2020-03-14 | Disposition: A | Discharge status: Home / Self Care | Payer: Medicare Other | Source: Ambulatory Visit | Attending: Surgery | Admitting: Surgery

## 2020-03-14 ENCOUNTER — Ambulatory Visit (HOSPITAL_COMMUNITY)
Admission: RE | Admit: 2020-03-14 | Discharge: 2020-03-14 | Disposition: A | Discharge status: Home / Self Care | Payer: Medicare Other | Source: Ambulatory Visit | Attending: Surgery | Admitting: Surgery

## 2020-03-14 VITALS — BP 134/74 | HR 57 | Temp 98.2°F | Resp 20 | Ht 69.0 in | Wt 181.1 lb

## 2020-03-14 DIAGNOSIS — I779 Disorder of arteries and arterioles, unspecified: Secondary | ICD-10-CM

## 2020-03-14 DIAGNOSIS — Z95820 Peripheral vascular angioplasty status with implants and grafts: Secondary | ICD-10-CM

## 2020-03-14 DIAGNOSIS — E119 Type 2 diabetes mellitus without complications: Secondary | ICD-10-CM | POA: Diagnosis not present

## 2020-03-14 DIAGNOSIS — I10 Essential (primary) hypertension: Secondary | ICD-10-CM | POA: Diagnosis not present

## 2020-03-14 DIAGNOSIS — I739 Peripheral vascular disease, unspecified: Secondary | ICD-10-CM | POA: Diagnosis not present

## 2020-03-14 DIAGNOSIS — I6523 Occlusion and stenosis of bilateral carotid arteries: Secondary | ICD-10-CM | POA: Diagnosis not present

## 2020-03-14 DIAGNOSIS — M1990 Unspecified osteoarthritis, unspecified site: Secondary | ICD-10-CM | POA: Diagnosis not present

## 2020-03-14 DIAGNOSIS — I2510 Atherosclerotic heart disease of native coronary artery without angina pectoris: Secondary | ICD-10-CM | POA: Diagnosis not present

## 2020-03-14 DIAGNOSIS — M170 Bilateral primary osteoarthritis of knee: Secondary | ICD-10-CM | POA: Diagnosis not present

## 2020-03-14 DIAGNOSIS — I480 Paroxysmal atrial fibrillation: Secondary | ICD-10-CM | POA: Diagnosis not present

## 2020-03-14 DIAGNOSIS — E7800 Pure hypercholesterolemia, unspecified: Secondary | ICD-10-CM | POA: Diagnosis not present

## 2020-03-14 NOTE — Progress Notes (Addendum)
Office Note     CC:  follow up Requesting Provider:  Shirline Frees, MD  HPI: Scott [name] is a 69 y.o. ([birth year]) male who presents for follow-up after undergoing aortogram by Dr. Oneida Alar on 07/03/2019 with the following findings:  Operative findings: #1 calcified vessels mild atherosclerosis to moderate no significant flow-limiting stenosis left leg 2.  Subtotal occlusion right superficial femoral artery stent midportion angioplastied to 0% residual stenosis three-vessel runoff to the right foot  He currently denies claudication, rest pain, monocular blindness, upper extremity weakness or paralysis, slurred speech or facial drooping.  He states he has a very minimal amount of left residual hand weakness from previous stroke.  He tells me he was prescribed Plavix after his SFA stent was placed but he presumed this was in error as he was already on Eliquis for atrial fibrillation.  In 2019 he was evaluated by Dr. Willis Modena at [facility] and underwent ablation for atrial fibrillation.  He states he has never taken aspirin.  Right SFA stent placed in May 2018 by Dr. Oneida Alar for RLE claudication. Right CEA for symptomatic disease in March 2019 by Dr. [name] On Eliquis for atrial fibrillation. Compliant with statin and fenofibrate.  Past Medical History:  Diagnosis Date  . CAD (coronary artery disease)   . Cerebrovascular disease   . HLD (hyperlipidemia)   . HTN (hypertension)   . IBS (irritable bowel syndrome)   . Lumbar stenosis   . MI, old   . Persistent atrial fibrillation ([place])   . PONV (postoperative nausea and vomiting)   . Pre-diabetes   . PVD (peripheral vascular disease) ([place])   . Stroke (cerebrum) ([place])   . Tobacco abuse     Past Surgical History:  Procedure Laterality Date  . ABDOMINAL AORTOGRAM W/LOWER EXTREMITY N/A 01/25/2017   Procedure: Abdominal Aortogram w/Lower Extremity;  Surgeon: Elam Dutch, MD;  Location: [place] [facility];  Service: Cardiovascular;   Laterality: N/A;  . ANTERIOR CRUCIATE LIGAMENT REPAIR    . CARDIAC CATHETERIZATION     [place]  . CARDIAC CATHETERIZATION N/A 07/27/2016   Procedure: Left Heart Cath and Coronary Angiography;  Surgeon: Sherren Mocha, MD;  Location: [place] [facility];  Service: Cardiovascular;  Laterality: N/A;  . CARDIOVERSION N/A 06/29/2016   Procedure: CARDIOVERSION;  Surgeon: Fay Records, MD;  Location: [facility] ENDOSCOPY;  Service: Cardiovascular;  Laterality: N/A;  . CARDIOVERSION N/A 03/05/2018   Procedure: CARDIOVERSION;  Surgeon: Larey Dresser, MD;  Location: [facility] ENDOSCOPY;  Service: Cardiovascular;  Laterality: N/A;  . CHOLECYSTECTOMY    . ENDARTERECTOMY Right 12/06/2017   Procedure: ENDARTERECTOMY CAROTID RIGHT;  Surgeon: Serafina Mitchell, MD;  Location: [place];  Service: Vascular;  Laterality: Right;  . [place], 2000   x 4 times.  . LOWER EXTREMITY ANGIOGRAPHY N/A 07/03/2019   Procedure: LOWER EXTREMITY ANGIOGRAPHY;  Surgeon: Elam Dutch, MD;  Location: [place] [facility];  Service: Cardiovascular;  Laterality: N/A;  . LUMBAR LAMINECTOMY/DECOMPRESSION MICRODISCECTOMY Bilateral 09/09/2018   Procedure: Laminectomy and Foraminotomy bilateral Lumbar three-Lumbar four - Lumbar four-Lumbar five;  Surgeon: Earnie Larsson, MD;  Location: [place];  Service: Neurosurgery;  Laterality: Bilateral;  . PATCH ANGIOPLASTY Right 12/06/2017   Procedure: PATCH ANGIOPLASTY RIGHT CAROTID ARTERY;  Surgeon: Serafina Mitchell, MD;  Location: [place];  Service: Vascular;  Laterality: Right;  . PERIPHERAL VASCULAR INTERVENTION Right 01/25/2017   Procedure: Peripheral Vascular Intervention;  Surgeon: Elam Dutch, MD;  Location: [place]  [facility];  Service: Cardiovascular;  Laterality: Right;  . PERIPHERAL VASCULAR INTERVENTION Right 07/03/2019   Procedure: PERIPHERAL VASCULAR INTERVENTION;  Surgeon: Elam Dutch, MD;  Location: [place] [facility];  Service: Cardiovascular;  Laterality: Right;  .  TONSILLECTOMY      Social History   Socioeconomic History  . Marital status: Married    Spouse name: Not on file  . Number of children: 2  . Years of education: Not on file  . Highest education level: Not on file  Occupational History  . Occupation: retired  Tobacco Use  . Smoking status: Current Some Day Smoker    Packs/day: 0.50    Years: 43.00    Pack years: 21.50    Types: Cigarettes    Start date: 06/02/1971    Last attempt to quit: 12/09/2017    Years since quitting: 2.2  . Smokeless tobacco: Never Used  . Tobacco comment: pt vapes  Vaping Use  . Vaping Use: Some days  . Substances: Nicotine  Substance and Sexual Activity  . Alcohol use: Yes    Alcohol/week: 0.0 standard drinks    Comment: occasionaly  . Drug use: No  . Sexual activity: Not on file  Other Topics Concern  . Not on file  Social History Narrative   He is originally from [place]. He moved to [facility] in [place]. Previously has traveled to most of the [place]. Has traveled to [place]. Has been to [place], [place], & [place]. 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 Remote bird exposure in 1980. No mold exposure.    Social Determinants of Health   Financial Resource Strain:   . Difficulty of Paying Living Expenses:   Food Insecurity:   . Worried About Charity fundraiser in the Last Year:   . Arboriculturist in the Last Year:   Transportation Needs:   . Film/video editor (Medical):   Marland Kitchen Lack of Transportation (Non-Medical):   Physical Activity:   . Days of Exercise per Week:   . Minutes of Exercise per Session:   Stress:   . Feeling of Stress :   Social Connections:   . Frequency of Communication with Friends and Family:   . Frequency of Social Gatherings with Friends and Family:   . Attends Religious Services:   . Active Member of Clubs or Organizations:   . Attends Archivist Meetings:   Marland Kitchen Marital Status:   Intimate Partner Violence:   . Fear of  Current or Ex-Partner:   . Emotionally Abused:   Marland Kitchen Physically Abused:   . Sexually Abused:    Family History  Problem Relation Age of Onset  . Heart disease Father   . Melanoma Father   . Testicular cancer Father   . Hypertension Mother   . Arthritis Mother   . Arthritis Sister   . Lung disease Neg Hx     Current Outpatient Medications  Medication Sig Dispense Refill  . amLODipine (NORVASC) 10 MG tablet Take 10 mg by mouth at bedtime.     Marland Kitchen atorvastatin (LIPITOR) 40 MG tablet Take 1 tablet (40 mg total) by mouth daily. 30 tablet 0  . Carboxymethylcellul-Glycerin (REFRESH OPTIVE OP) Place 1 drop into both eyes every morning.     . chlorthalidone (HYGROTON) 25 MG tablet TAKE 1 TABLET BY MOUTH EVERY DAY IN THE MORNING WITH FOOD    . diazepam (VALIUM) 2 MG tablet Take 2 mg by mouth  2 (two) times daily as needed.    Marland Kitchen ELIQUIS 5 MG TABS tablet TAKE 1 TABLET(5 MG) BY MOUTH TWICE DAILY 60 tablet 5  . fenofibrate (TRICOR) 145 MG tablet Take 145 mg by mouth daily.    Marland Kitchen gabapentin (NEURONTIN) 300 MG capsule 1 tab q hsx5 days,1 tab bidx5days,1 tab bid&2 tabs q hsx5days,2 tabs q am,1 tab q pm&2 tabs q hsx5days, then 2 tabs 3 times a day    . HYDROcodone-acetaminophen (NORCO/VICODIN) 5-325 MG tablet Take 1-2 tablets by mouth every 6 (six) hours as needed (for pain). 8 tablet 0  . losartan (COZAAR) 100 MG tablet Take 100 mg by mouth daily.    . metFORMIN (GLUCOPHAGE) 500 MG tablet Take 500 mg by mouth 2 (two) times daily with a meal.     . ondansetron (ZOFRAN) 4 MG tablet Take 4 mg by mouth every 8 (eight) hours as needed for nausea or vomiting.    . potassium chloride (KLOR-CON) 10 MEQ tablet Take 10 mEq by mouth every other day.    . Potassium Chloride ER 20 MEQ TBCR Take 1 tablet by mouth daily.    . sildenafil (VIAGRA) 100 MG tablet Take by mouth.    . spironolactone (ALDACTONE) 25 MG tablet Take 25 mg by mouth daily.    . valsartan (DIOVAN) 320 MG tablet Take 320 mg by mouth daily.     No  current facility-administered medications for this visit.    Allergies  Allergen Reactions  . Augmentin [Amoxicillin-Pot Clavulanate] Rash     REVIEW OF SYSTEMS:   [X]  denotes positive finding, [ ]  denotes negative finding Cardiac  Comments:  Chest pain or chest pressure:    Shortness of breath upon exertion:    Short of breath when lying flat:    Irregular heart rhythm:        Vascular    Pain in calf, thigh, or hip brought on by ambulation:    Pain in feet at night that wakes you up from your sleep:     Blood clot in your veins:    Leg swelling:         Pulmonary    Oxygen at home:    Productive cough:     Wheezing:         Neurologic    Sudden weakness in arms or legs:     Sudden numbness in arms or legs:     Sudden onset of difficulty speaking or slurred speech:    Temporary loss of vision in one eye:     Problems with dizziness:         Gastrointestinal    Blood in stool:     Vomited blood:         Genitourinary    Burning when urinating:     Blood in urine:        Psychiatric    Major depression:         Hematologic    Bleeding problems:    Problems with blood clotting too easily:        Skin    Rashes or ulcers:        Constitutional    Fever or chills:      PHYSICAL EXAMINATION:  Vitals:   03/14/20 1058  BP: 134/74  Pulse: (!) 57  Resp: 20  Temp: 98.2 F (36.8 C)  TempSrc: Temporal  SpO2: 96%  Weight: 181 lb 1.6 oz (82.1 kg)  Height: 5\' 9"  (1.753 m)  General:  WDWN in NAD; vital signs documented above Gait: Normal HENT: WNL, normocephalic Pulmonary: normal non-labored breathing , without Rales, rhonchi,  wheezing Cardiac: regular HR, without  Murmurs without carotid bruit Abdomen: soft, NT, no masses Skin: without rashes Vascular Exam/Pulses: 2+ dorsalis pedis pulses and 1+ posterior tibial pulses bilaterally.  2+ radial and brachial pulses bilaterally Extremities: without ischemic changes, without Gangrene , without  cellulitis; without open wounds;  Musculoskeletal: no muscle wasting or atrophy  Neurologic: A&O X 3;  No focal weakness or paresthesias are detected Psychiatric:  The pt has Normal affect.   Non-Invasive Vascular Imaging:   03/14/2020 ABI/TBIToday's ABIToday's TBIPrevious ABIPrevious TBI  +-------+-----------+-----------+------------+------------+  Right 1.00    0.42    1.06    0.65      +-------+-----------+-----------+------------+------------+  Left  0.94    0.68    1.06    0.73   Waveforms tri- and biphasic Right TP: 66 Left TP: 107 +----------+--------+-----+--------+---------+--------+  RIGHT   PSV cm/sRatioStenosisWaveform Comments  +----------+--------+-----+--------+---------+--------+  CFA Distal75          biphasic       +----------+--------+-----+--------+---------+--------+  DFA    64          biphasic       +----------+--------+-----+--------+---------+--------+  SFA Prox 99          triphasic      +----------+--------+-----+--------+---------+--------+      Right Stent(s):  +-----------------+--------+--------+--------+--------+  Mid to distal SFAPSV cm/sStenosisWaveformComments  +-----------------+--------+--------+--------+--------+  Prox to Stent  109       biphasic      +-----------------+--------+--------+--------+--------+  Proximal Stent  100       biphasic      +-----------------+--------+--------+--------+--------+  Mid Stent    107       biphasic      +-----------------+--------+--------+--------+--------+  Distal Stent   89       biphasic      +-----------------+--------+--------+--------+--------+  Distal to Stent 71       biphasic      +-----------------+--------+--------+--------+--------+    Summary:  Right: Patent stent with no  evidence of stenosis in the Mid to distal SFA  artery.   ASSESSMENT/PLAN:: 69 y.o. male here for follow up for right SFA stent status post angioplasty for stenosis September 2020.  Patient has no signs and symptoms of lower extremity ischemia.  I will review the patient's follow-up exam with Dr. Oneida Alar in regards to recommendations for daily Plavix and aspirin 81 mg.  Follow-up in 6 months with ABIs and left lower extremity duplex study.  The patient has a history of symptomatic carotid artery disease status post right carotid endarterectomy.  Follow-up with bilateral carotid duplex and evaluation in 6 months.  He is currently asymptomatic and reviewed signs and symptoms of stroke/TIA with the patient and encouraged him to seek immediate medical attention should these occur.  Ongoing tobacco use: The patient is continuing to work at smoking cessation.  He will go periods of time where he will smoke no more than 5 cigarettes daily.  He states vaping is assisting him with smoking cessation.  Addendum dated 03/16/2020.  I discussed the case with Dr. Oneida Alar today.  We recommend an 81 mg aspirin daily to provide antiplatelet therapy.  I telephoned the patient at home and we discussed this.  He states back in the 90s he was told not to take aspirin and I encouraged him to discuss this recommended therapy with his cardiologist.  He verbalizes understanding.  Barbie Banner,  PA-C [facility] [phone]

## 2020-03-16 ENCOUNTER — Other Ambulatory Visit: Payer: Self-pay | Admitting: *Deleted

## 2020-03-16 DIAGNOSIS — I739 Peripheral vascular disease, unspecified: Secondary | ICD-10-CM

## 2020-03-16 DIAGNOSIS — I6523 Occlusion and stenosis of bilateral carotid arteries: Secondary | ICD-10-CM

## 2020-03-16 DIAGNOSIS — Z95820 Peripheral vascular angioplasty status with implants and grafts: Secondary | ICD-10-CM

## 2020-03-23 DIAGNOSIS — R29898 Other symptoms and signs involving the musculoskeletal system: Secondary | ICD-10-CM | POA: Insufficient documentation

## 2020-04-11 DIAGNOSIS — N5201 Erectile dysfunction due to arterial insufficiency: Secondary | ICD-10-CM | POA: Diagnosis not present

## 2020-04-11 DIAGNOSIS — E119 Type 2 diabetes mellitus without complications: Secondary | ICD-10-CM | POA: Diagnosis not present

## 2020-04-11 DIAGNOSIS — I739 Peripheral vascular disease, unspecified: Secondary | ICD-10-CM | POA: Diagnosis not present

## 2020-04-11 DIAGNOSIS — E7800 Pure hypercholesterolemia, unspecified: Secondary | ICD-10-CM | POA: Diagnosis not present

## 2020-04-11 DIAGNOSIS — Z7984 Long term (current) use of oral hypoglycemic drugs: Secondary | ICD-10-CM | POA: Diagnosis not present

## 2020-04-11 DIAGNOSIS — I10 Essential (primary) hypertension: Secondary | ICD-10-CM | POA: Diagnosis not present

## 2020-04-11 DIAGNOSIS — F17200 Nicotine dependence, unspecified, uncomplicated: Secondary | ICD-10-CM | POA: Diagnosis not present

## 2020-04-11 DIAGNOSIS — G894 Chronic pain syndrome: Secondary | ICD-10-CM | POA: Diagnosis not present

## 2020-04-11 DIAGNOSIS — I480 Paroxysmal atrial fibrillation: Secondary | ICD-10-CM | POA: Diagnosis not present

## 2020-05-12 DIAGNOSIS — Z7984 Long term (current) use of oral hypoglycemic drugs: Secondary | ICD-10-CM | POA: Diagnosis not present

## 2020-05-12 DIAGNOSIS — E119 Type 2 diabetes mellitus without complications: Secondary | ICD-10-CM | POA: Diagnosis not present

## 2020-05-12 DIAGNOSIS — E7800 Pure hypercholesterolemia, unspecified: Secondary | ICD-10-CM | POA: Diagnosis not present

## 2020-05-24 DIAGNOSIS — I2510 Atherosclerotic heart disease of native coronary artery without angina pectoris: Secondary | ICD-10-CM | POA: Diagnosis not present

## 2020-05-24 DIAGNOSIS — I480 Paroxysmal atrial fibrillation: Secondary | ICD-10-CM | POA: Diagnosis not present

## 2020-05-24 DIAGNOSIS — M1990 Unspecified osteoarthritis, unspecified site: Secondary | ICD-10-CM | POA: Diagnosis not present

## 2020-05-24 DIAGNOSIS — E119 Type 2 diabetes mellitus without complications: Secondary | ICD-10-CM | POA: Diagnosis not present

## 2020-05-24 DIAGNOSIS — M170 Bilateral primary osteoarthritis of knee: Secondary | ICD-10-CM | POA: Diagnosis not present

## 2020-05-24 DIAGNOSIS — I10 Essential (primary) hypertension: Secondary | ICD-10-CM | POA: Diagnosis not present

## 2020-05-24 DIAGNOSIS — E7800 Pure hypercholesterolemia, unspecified: Secondary | ICD-10-CM | POA: Diagnosis not present

## 2020-06-22 DIAGNOSIS — M1990 Unspecified osteoarthritis, unspecified site: Secondary | ICD-10-CM | POA: Diagnosis not present

## 2020-06-22 DIAGNOSIS — I480 Paroxysmal atrial fibrillation: Secondary | ICD-10-CM | POA: Diagnosis not present

## 2020-06-22 DIAGNOSIS — M170 Bilateral primary osteoarthritis of knee: Secondary | ICD-10-CM | POA: Diagnosis not present

## 2020-06-22 DIAGNOSIS — I10 Essential (primary) hypertension: Secondary | ICD-10-CM | POA: Diagnosis not present

## 2020-06-22 DIAGNOSIS — E7800 Pure hypercholesterolemia, unspecified: Secondary | ICD-10-CM | POA: Diagnosis not present

## 2020-06-22 DIAGNOSIS — E119 Type 2 diabetes mellitus without complications: Secondary | ICD-10-CM | POA: Diagnosis not present

## 2020-06-22 DIAGNOSIS — I2510 Atherosclerotic heart disease of native coronary artery without angina pectoris: Secondary | ICD-10-CM | POA: Diagnosis not present

## 2020-07-03 ENCOUNTER — Other Ambulatory Visit: Payer: Self-pay | Admitting: Internal Medicine

## 2020-07-04 NOTE — Telephone Encounter (Signed)
Pt last saw [name], [place] on 10/26/19 telemedicine Covid-19, last labs 10/15/19 Creat 1.06 at [facility] per [facility], age 69, weight 82.1kg, based on specified criteria pt is on appropriate dosage of Eliquis 5mg  BID.  Will refill rx.

## 2020-07-13 DIAGNOSIS — E119 Type 2 diabetes mellitus without complications: Secondary | ICD-10-CM | POA: Diagnosis not present

## 2020-07-13 DIAGNOSIS — I480 Paroxysmal atrial fibrillation: Secondary | ICD-10-CM | POA: Diagnosis not present

## 2020-07-13 DIAGNOSIS — M170 Bilateral primary osteoarthritis of knee: Secondary | ICD-10-CM | POA: Diagnosis not present

## 2020-07-13 DIAGNOSIS — E7800 Pure hypercholesterolemia, unspecified: Secondary | ICD-10-CM | POA: Diagnosis not present

## 2020-07-13 DIAGNOSIS — I2510 Atherosclerotic heart disease of native coronary artery without angina pectoris: Secondary | ICD-10-CM | POA: Diagnosis not present

## 2020-07-13 DIAGNOSIS — M1990 Unspecified osteoarthritis, unspecified site: Secondary | ICD-10-CM | POA: Diagnosis not present

## 2020-07-13 DIAGNOSIS — I10 Essential (primary) hypertension: Secondary | ICD-10-CM | POA: Diagnosis not present

## 2020-07-22 DIAGNOSIS — I10 Essential (primary) hypertension: Secondary | ICD-10-CM | POA: Diagnosis not present

## 2020-07-24 DIAGNOSIS — I10 Essential (primary) hypertension: Secondary | ICD-10-CM | POA: Diagnosis not present

## 2020-07-26 DIAGNOSIS — Z23 Encounter for immunization: Secondary | ICD-10-CM | POA: Diagnosis not present

## 2020-08-01 DIAGNOSIS — M1990 Unspecified osteoarthritis, unspecified site: Secondary | ICD-10-CM | POA: Diagnosis not present

## 2020-08-01 DIAGNOSIS — M170 Bilateral primary osteoarthritis of knee: Secondary | ICD-10-CM | POA: Diagnosis not present

## 2020-08-01 DIAGNOSIS — E7800 Pure hypercholesterolemia, unspecified: Secondary | ICD-10-CM | POA: Diagnosis not present

## 2020-08-01 DIAGNOSIS — E119 Type 2 diabetes mellitus without complications: Secondary | ICD-10-CM | POA: Diagnosis not present

## 2020-08-01 DIAGNOSIS — I2510 Atherosclerotic heart disease of native coronary artery without angina pectoris: Secondary | ICD-10-CM | POA: Diagnosis not present

## 2020-08-01 DIAGNOSIS — I10 Essential (primary) hypertension: Secondary | ICD-10-CM | POA: Diagnosis not present

## 2020-08-01 DIAGNOSIS — I480 Paroxysmal atrial fibrillation: Secondary | ICD-10-CM | POA: Diagnosis not present

## 2020-08-25 DIAGNOSIS — Z0000 Encounter for general adult medical examination without abnormal findings: Secondary | ICD-10-CM | POA: Diagnosis not present

## 2020-08-25 DIAGNOSIS — M20032 Swan-neck deformity of left finger(s): Secondary | ICD-10-CM | POA: Diagnosis not present

## 2020-08-25 DIAGNOSIS — I10 Essential (primary) hypertension: Secondary | ICD-10-CM | POA: Diagnosis not present

## 2020-08-25 DIAGNOSIS — Z125 Encounter for screening for malignant neoplasm of prostate: Secondary | ICD-10-CM | POA: Diagnosis not present

## 2020-08-25 DIAGNOSIS — I480 Paroxysmal atrial fibrillation: Secondary | ICD-10-CM | POA: Diagnosis not present

## 2020-08-25 DIAGNOSIS — I2510 Atherosclerotic heart disease of native coronary artery without angina pectoris: Secondary | ICD-10-CM | POA: Diagnosis not present

## 2020-08-25 DIAGNOSIS — G894 Chronic pain syndrome: Secondary | ICD-10-CM | POA: Diagnosis not present

## 2020-08-25 DIAGNOSIS — F17200 Nicotine dependence, unspecified, uncomplicated: Secondary | ICD-10-CM | POA: Diagnosis not present

## 2020-08-25 DIAGNOSIS — Z23 Encounter for immunization: Secondary | ICD-10-CM | POA: Diagnosis not present

## 2020-08-25 DIAGNOSIS — E7800 Pure hypercholesterolemia, unspecified: Secondary | ICD-10-CM | POA: Diagnosis not present

## 2020-08-25 DIAGNOSIS — E119 Type 2 diabetes mellitus without complications: Secondary | ICD-10-CM | POA: Diagnosis not present

## 2020-08-25 DIAGNOSIS — I739 Peripheral vascular disease, unspecified: Secondary | ICD-10-CM | POA: Diagnosis not present

## 2020-08-29 DIAGNOSIS — M79645 Pain in left finger(s): Secondary | ICD-10-CM | POA: Diagnosis not present

## 2020-09-08 ENCOUNTER — Telehealth: Payer: Self-pay | Admitting: *Deleted

## 2020-09-08 DIAGNOSIS — M19042 Primary osteoarthritis, left hand: Secondary | ICD-10-CM | POA: Diagnosis not present

## 2020-09-08 DIAGNOSIS — M20012 Mallet finger of left finger(s): Secondary | ICD-10-CM | POA: Diagnosis not present

## 2020-09-08 NOTE — Telephone Encounter (Signed)
Lets have him hold 3 doses of Eliquis . That should be fine for this finger surgery  Thanks He is at low risk for CV complication

## 2020-09-08 NOTE — Telephone Encounter (Signed)
   Primary Cardiologist: Mertie Moores, MD  Chart reviewed as part of pre-operative protocol coverage. Given past medical history and time since last visit, based on ACC/AHA guidelines, DAKARI CREGGER would be at acceptable risk for the planned procedure without further cardiovascular testing.   Patient with diagnosis of afib on Eliquis for anticoagulation.    Procedure: LEFT SMALL FINGER DISTAL INTERPHALANGEAL JOINT FUSION Date of procedure: 10/19/20    CHA2DS2-VASc Score = 6  This indicates a 9.7% annual risk of stroke. [name] score is based upon: CHF History: No HTN History: Yes Diabetes History: Yes Stroke History: Yes Vascular Disease History: Yes Age Score: 1 Gender Score: 0     CrCl 58 ml/min  He may hold his Eliquis for 3 doses prior to his finger surgery.  Please resume Eliquis as soon as hemostasis is achieved.  Patient was advised that if he develops new symptoms prior to surgery to contact our office to arrange a follow-up appointment.  He verbalized understanding.  I will route this recommendation to the requesting party via Epic fax function and remove from pre-op pool.  Please call with questions.  Jossie Ng.  NP-C    09/08/2020, [facility] [place] [address] Office [phone] Fax [phone]

## 2020-09-08 NOTE — Telephone Encounter (Addendum)
Patient with diagnosis of afib on Eliquis for anticoagulation.    Procedure: LEFT SMALL FINGER DISTAL INTERPHALANGEAL JOINT FUSION  Date of procedure: 10/19/20    CHA2DS2-VASc Score = 6  This indicates a 9.7% annual risk of stroke. [name] score is based upon: CHF History: No HTN History: Yes Diabetes History: Yes Stroke History: Yes Vascular Disease History: Yes Age Score: 1 Gender Score: 0      CrCl 58 ml/min  Patient had a questionable TIA many years ago. I will ask Dr. Acie Fredrickson to weigh in.

## 2020-09-08 NOTE — Telephone Encounter (Signed)
   [facility] Pre-operative Risk Assessment    HEARTCARE STAFF: - Please ensure there is not already an duplicate clearance open for this procedure. - Under Visit Info/Reason for Call, type in Other and utilize the format Clearance MM/DD/YY or Clearance TBD. Do not use dashes or single digits. - If request is for dental extraction, please clarify the # of teeth to be extracted.  Request for surgical clearance:  1. What type of surgery is being performed? LEFT SMALL FINGER DISTAL INTERPHALANGEAL JOINT FUSION   2. When is this surgery scheduled? 10/19/20   3. What type of clearance is required (medical clearance vs. Pharmacy clearance to hold med vs. Both)? BOTH  4. Are there any medications that need to be held prior to surgery and how long? ELIQUIS   5. Practice name and name of physician performing surgery? [facility]; DR. Charlotte Crumb   6. What is the office phone number? [phone]   7.   What is the office fax number? [phone]   8.   Anesthesia type (None, local, MAC, general) ? AXILLARY BLOCK/MAC   Julaine Hua 09/08/2020, 3:03 PM  _________________________________________________________________   (provider comments below)

## 2020-09-13 ENCOUNTER — Other Ambulatory Visit: Payer: Self-pay | Admitting: *Deleted

## 2020-09-13 ENCOUNTER — Telehealth: Payer: Self-pay | Admitting: Acute Care

## 2020-09-13 DIAGNOSIS — Z87891 Personal history of nicotine dependence: Secondary | ICD-10-CM

## 2020-09-13 DIAGNOSIS — F17210 Nicotine dependence, cigarettes, uncomplicated: Secondary | ICD-10-CM

## 2020-09-13 NOTE — Telephone Encounter (Signed)
Spoke with pt and scheduled SDMV 10/12/20 12:00 CT ordered Nothing further needed

## 2020-10-12 ENCOUNTER — Encounter: Payer: Self-pay | Admitting: Acute Care

## 2020-10-12 ENCOUNTER — Other Ambulatory Visit: Payer: Self-pay

## 2020-10-12 ENCOUNTER — Ambulatory Visit
Admission: RE | Admit: 2020-10-12 | Discharge: 2020-10-12 | Disposition: A | Discharge status: Home / Self Care | Payer: Medicare Other | Source: Ambulatory Visit | Attending: Acute Care | Admitting: Acute Care

## 2020-10-12 ENCOUNTER — Ambulatory Visit (INDEPENDENT_AMBULATORY_CARE_PROVIDER_SITE_OTHER): Payer: Medicare Other | Admitting: Acute Care

## 2020-10-12 DIAGNOSIS — Z122 Encounter for screening for malignant neoplasm of respiratory organs: Secondary | ICD-10-CM

## 2020-10-12 DIAGNOSIS — I2510 Atherosclerotic heart disease of native coronary artery without angina pectoris: Secondary | ICD-10-CM | POA: Diagnosis not present

## 2020-10-12 DIAGNOSIS — J432 Centrilobular emphysema: Secondary | ICD-10-CM | POA: Diagnosis not present

## 2020-10-12 DIAGNOSIS — F17210 Nicotine dependence, cigarettes, uncomplicated: Secondary | ICD-10-CM

## 2020-10-12 DIAGNOSIS — J8410 Pulmonary fibrosis, unspecified: Secondary | ICD-10-CM | POA: Diagnosis not present

## 2020-10-12 DIAGNOSIS — Z87891 Personal history of nicotine dependence: Secondary | ICD-10-CM

## 2020-10-12 NOTE — Patient Instructions (Signed)
Thank you for participating in the [facility]. It was our pleasure to meet you today. We will call you with the results of your scan within the next few days. Your scan will be assigned a Lung RADS category score by the physicians reading the scans.  This Lung RADS score determines follow up scanning.  See below for description of categories, and follow up screening recommendations. We will be in touch to schedule your follow up screening annually or based on recommendations of our providers. We will fax a copy of your scan results to your Primary Care Physician, or the physician who referred you to the program, to ensure they have the results. Please call the office if you have any questions or concerns regarding your scanning experience or results.  Our office number is [phone]. Please speak with Denise Phelps, RN. She is our Lung Cancer Screening RN. If she is unavailable when you call, please have the office staff send her a message. She will return your call at her earliest convenience. Remember, if your scan is normal, we will scan you annually as long as you continue to meet the criteria for the program. (Age 55-77, Current smoker or smoker who has quit within the last 15 years). If you are a smoker, remember, quitting is the single most powerful action that you can take to decrease your risk of lung cancer and other pulmonary, breathing related problems. We know quitting is hard, and we are here to help.  Please let us know if there is anything we can do to help you meet your goal of quitting. If you are a former smoker, congratulations. We are proud of you! Remain smoke free! Remember you can refer friends or family members through the number above.  We will screen them to make sure they meet criteria for the program. Thank you for helping us take better care of you by participating in Lung Screening.  Lung RADS Categories:  Lung RADS 1: no nodules  or definitely non-concerning nodules.  Recommendation is for a repeat annual scan in 12 months.  Lung RADS 2:  nodules that are non-concerning in appearance and behavior with a very low likelihood of becoming an active cancer. Recommendation is for a repeat annual scan in 12 months.  Lung RADS 3: nodules that are probably non-concerning , includes nodules with a low likelihood of becoming an active cancer.  Recommendation is for a 6-month repeat screening scan. Often noted after an upper respiratory illness. We will be in touch to make sure you have no questions, and to schedule your 6-month scan.  Lung RADS 4 A: nodules with concerning findings, recommendation is most often for a follow up scan in 3 months or additional testing based on our provider's assessment of the scan. We will be in touch to make sure you have no questions and to schedule the recommended 3 month follow up scan.  Lung RADS 4 B:  indicates findings that are concerning. We will be in touch with you to schedule additional diagnostic testing based on our provider's  assessment of the scan.   

## 2020-10-12 NOTE — Progress Notes (Signed)
Shared Decision Making Visit Lung Cancer Screening Program [id number])   Eligibility:  Age 70 y.o.  Pack Years Smoking History Calculation 62 pack year smoking history (# packs/per year x # years smoked)  Recent History of coughing up blood  no  Unexplained weight loss? no ( >Than 15 pounds within the last 6 months )  Prior History Lung / other cancer no (Diagnosis within the last 5 years already requiring surveillance chest CT Scans).  Smoking Status Current Smoker  Former Smokers: Years since quit: NA  Quit Date: NA  Visit Components:  Discussion included one or more decision making aids. yes  Discussion included risk/benefits of screening. yes  Discussion included potential follow up diagnostic testing for abnormal scans. yes  Discussion included meaning and risk of over diagnosis. yes  Discussion included meaning and risk of False Positives. yes  Discussion included meaning of total radiation exposure. yes  Counseling Included:  Importance of adherence to annual lung cancer LDCT screening. yes  Impact of comorbidities on ability to participate in the program. yes  Ability and willingness to under diagnostic treatment. yes  Smoking Cessation Counseling:  Current Smokers:   Discussed importance of smoking cessation. yes  Information about tobacco cessation classes and interventions provided to patient. yes  Patient provided with "ticket" for LDCT Scan. yes  Symptomatic Patient. no  Counseling NA  Diagnosis Code: Tobacco Use Z72.0  Asymptomatic Patient yes  Counseling (Intermediate counseling: > three minutes counseling) X9147  Former Smokers:   Discussed the importance of maintaining cigarette abstinence. yes  Diagnosis Code: Personal History of Nicotine Dependence. W29.562  Information about tobacco cessation classes and interventions provided to patient. Yes  Patient provided with "ticket" for LDCT Scan. yes  Written Order for Lung Cancer  Screening with LDCT placed in Epic. Yes (CT Chest Lung Cancer Screening Low Dose W/O CM) [id number] Z12.2-Screening of respiratory organs Z87.891-Personal history of nicotine dependence  I have spent 25 minutes of face to face time with Mr. Rolph discussing the risks and benefits of lung cancer screening. We viewed a power point together that explained in detail the above noted topics. We paused at intervals to allow for questions to be asked and answered to ensure understanding.We discussed that the single most powerful action that he can take to decrease his risk of developing lung cancer is to quit smoking. We discussed whether or not he is ready to commit to setting a quit date. We discussed options for tools to aid in quitting smoking including nicotine replacement therapy, non-nicotine medications, support groups, Quit Smart classes, and behavior modification. We discussed that often times setting smaller, more achievable goals, such as eliminating 1 cigarette a day for a week and then 2 cigarettes a day for a week can be helpful in slowly decreasing the number of cigarettes smoked. This allows for a sense of accomplishment as well as providing a clinical benefit. I gave him the " Be Stronger Than Your Excuses" card with contact information for community resources, classes, free nicotine replacement therapy, and access to mobile apps, text messaging, and on-line smoking cessation help. I have also given him my card and contact information in the event he needs to contact me. We discussed the time and location of the scan, and that either Doroteo Glassman RN or I will call with the results within 24-48 hours of receiving them. I have offered him  a copy of the power point we viewed  as a resource in the event they need reinforcement  of the concepts we discussed today in the office. The patient verbalized understanding of all of  the above and had no further questions upon leaving the office. They have my  contact information in the event they have any further questions.  I spent 4 minutes counseling on smoking cessation and the health risks of continued tobacco abuse.  I explained to the patient that there has been a high incidence of coronary artery disease noted on these exams. I explained that this is a non-gated exam therefore degree or severity cannot be determined. This patient is currently on statin therapy. I have asked the patient to follow-up with their PCP regarding any incidental finding of coronary artery disease and management with diet or medication as their PCP  feels is clinically indicated. The patient verbalized understanding of the above and had no further questions upon completion of the visit.      Magdalen Spatz, NP 10/12/2020

## 2020-10-19 ENCOUNTER — Other Ambulatory Visit: Payer: Self-pay

## 2020-10-19 DIAGNOSIS — K589 Irritable bowel syndrome without diarrhea: Secondary | ICD-10-CM | POA: Insufficient documentation

## 2020-10-19 DIAGNOSIS — M66232 Spontaneous rupture of extensor tendons, left forearm: Secondary | ICD-10-CM | POA: Diagnosis not present

## 2020-10-19 DIAGNOSIS — I2510 Atherosclerotic heart disease of native coronary artery without angina pectoris: Secondary | ICD-10-CM | POA: Diagnosis not present

## 2020-10-19 DIAGNOSIS — I10 Essential (primary) hypertension: Secondary | ICD-10-CM | POA: Diagnosis not present

## 2020-10-19 DIAGNOSIS — I480 Paroxysmal atrial fibrillation: Secondary | ICD-10-CM | POA: Diagnosis not present

## 2020-10-19 DIAGNOSIS — M1990 Unspecified osteoarthritis, unspecified site: Secondary | ICD-10-CM | POA: Diagnosis not present

## 2020-10-19 DIAGNOSIS — E119 Type 2 diabetes mellitus without complications: Secondary | ICD-10-CM | POA: Insufficient documentation

## 2020-10-19 DIAGNOSIS — M66242 Spontaneous rupture of extensor tendons, left hand: Secondary | ICD-10-CM | POA: Diagnosis not present

## 2020-10-19 DIAGNOSIS — M170 Bilateral primary osteoarthritis of knee: Secondary | ICD-10-CM | POA: Diagnosis not present

## 2020-10-19 DIAGNOSIS — I739 Peripheral vascular disease, unspecified: Secondary | ICD-10-CM | POA: Insufficient documentation

## 2020-10-19 DIAGNOSIS — E7800 Pure hypercholesterolemia, unspecified: Secondary | ICD-10-CM | POA: Insufficient documentation

## 2020-10-19 DIAGNOSIS — I70211 Atherosclerosis of native arteries of extremities with intermittent claudication, right leg: Secondary | ICD-10-CM

## 2020-10-19 DIAGNOSIS — R1013 Epigastric pain: Secondary | ICD-10-CM | POA: Insufficient documentation

## 2020-10-19 DIAGNOSIS — I6523 Occlusion and stenosis of bilateral carotid arteries: Secondary | ICD-10-CM

## 2020-10-19 DIAGNOSIS — N5201 Erectile dysfunction due to arterial insufficiency: Secondary | ICD-10-CM | POA: Insufficient documentation

## 2020-10-19 DIAGNOSIS — E291 Testicular hypofunction: Secondary | ICD-10-CM | POA: Insufficient documentation

## 2020-10-19 DIAGNOSIS — F17200 Nicotine dependence, unspecified, uncomplicated: Secondary | ICD-10-CM | POA: Insufficient documentation

## 2020-10-19 DIAGNOSIS — R110 Nausea: Secondary | ICD-10-CM | POA: Insufficient documentation

## 2020-10-19 DIAGNOSIS — K2100 Gastro-esophageal reflux disease with esophagitis, without bleeding: Secondary | ICD-10-CM | POA: Insufficient documentation

## 2020-10-19 DIAGNOSIS — G894 Chronic pain syndrome: Secondary | ICD-10-CM | POA: Insufficient documentation

## 2020-10-20 DIAGNOSIS — I10 Essential (primary) hypertension: Secondary | ICD-10-CM | POA: Diagnosis not present

## 2020-10-24 ENCOUNTER — Telehealth: Payer: Self-pay | Admitting: Acute Care

## 2020-10-24 DIAGNOSIS — F17210 Nicotine dependence, cigarettes, uncomplicated: Secondary | ICD-10-CM

## 2020-10-24 DIAGNOSIS — I10 Essential (primary) hypertension: Secondary | ICD-10-CM | POA: Diagnosis not present

## 2020-10-24 DIAGNOSIS — M20012 Mallet finger of left finger(s): Secondary | ICD-10-CM | POA: Diagnosis not present

## 2020-10-24 DIAGNOSIS — Z87891 Personal history of nicotine dependence: Secondary | ICD-10-CM

## 2020-10-24 NOTE — Telephone Encounter (Signed)
Results will be called to by the screening program. Thanks so much

## 2020-10-24 NOTE — Telephone Encounter (Signed)
Left detailed message on voicemail (per DPR) with CT results per Eric Form, NP. Results faxed to PCP. Order placed for 1 yr f/u low dose ct.

## 2020-10-24 NOTE — Telephone Encounter (Signed)
Will route to SG NP as patient is established in lung cancer screening program.  Wyn Quaker, FNP

## 2020-10-24 NOTE — Progress Notes (Signed)
Please call patient and let them  know their  low dose Ct was read as a Lung RADS 1, negative study: no nodules or definitely benign nodules. Radiology recommendation is for a repeat LDCT in 12 months. .Please let them  know we will order and schedule their  annual screening scan for 09/2021. Please let them  know there was notation of CAD on their  scan.  Please remind the patient  that this is a non-gated exam therefore degree or severity of disease  cannot be determined. Please have them  follow up with their PCP regarding potential risk factor modification, dietary therapy or pharmacologic therapy if clinically indicated. Pt.  is  currently on statin therapy. Please place order for annual  screening scan for  09/2021 and fax results to PCP. Thanks so much.

## 2020-10-25 ENCOUNTER — Telehealth: Payer: Self-pay | Admitting: Acute Care

## 2020-10-25 NOTE — Telephone Encounter (Signed)
error 

## 2020-10-26 NOTE — Telephone Encounter (Signed)
Called and spoke with pt. Stated to him that Langley Gauss called him on 1/31 and left a detailed message on machine of results but pt stated that he did not get the VM. Stated to pt the results of the lung cancer screen CT and he verbalized understanding. Nothing further needed.

## 2020-10-26 NOTE — Telephone Encounter (Signed)
Left message for pt to call back  °

## 2020-11-10 ENCOUNTER — Ambulatory Visit (HOSPITAL_COMMUNITY)
Admission: RE | Admit: 2020-11-10 | Discharge: 2020-11-10 | Disposition: A | Discharge status: Home / Self Care | Payer: Medicare Other | Source: Ambulatory Visit | Attending: Vascular Surgery | Admitting: Vascular Surgery

## 2020-11-10 ENCOUNTER — Ambulatory Visit (INDEPENDENT_AMBULATORY_CARE_PROVIDER_SITE_OTHER)
Admission: RE | Admit: 2020-11-10 | Discharge: 2020-11-10 | Disposition: A | Discharge status: Home / Self Care | Payer: Medicare Other | Source: Ambulatory Visit | Attending: Vascular Surgery | Admitting: Vascular Surgery

## 2020-11-10 ENCOUNTER — Other Ambulatory Visit: Payer: Self-pay

## 2020-11-10 ENCOUNTER — Ambulatory Visit (INDEPENDENT_AMBULATORY_CARE_PROVIDER_SITE_OTHER): Payer: Medicare Other | Admitting: Physician Assistant

## 2020-11-10 VITALS — BP 115/89 | HR 55 | Temp 98.3°F | Resp 16 | Ht 69.0 in | Wt 187.0 lb

## 2020-11-10 DIAGNOSIS — I70211 Atherosclerosis of native arteries of extremities with intermittent claudication, right leg: Secondary | ICD-10-CM | POA: Insufficient documentation

## 2020-11-10 DIAGNOSIS — I779 Disorder of arteries and arterioles, unspecified: Secondary | ICD-10-CM | POA: Diagnosis not present

## 2020-11-10 DIAGNOSIS — F17200 Nicotine dependence, unspecified, uncomplicated: Secondary | ICD-10-CM

## 2020-11-10 DIAGNOSIS — Z9889 Other specified postprocedural states: Secondary | ICD-10-CM

## 2020-11-10 DIAGNOSIS — I6523 Occlusion and stenosis of bilateral carotid arteries: Secondary | ICD-10-CM

## 2020-11-10 NOTE — Progress Notes (Signed)
Office Note     CC:  follow up Requesting Provider:  Shirline Frees, MD  HPI: Scott [name] is a 70 y.o. ([birth year]) male who presents for routine follow-up status post right superficial femoral artery stent placement secondary to claudication.  He currently denies lower extremity pain with walking or rest pain.  He has prior history of back surgery in 2019 with continued complaints of bilateral lower extremity weakness.  He states he had nerve conduction studies performed postoperatively that reveal some degree of nerve damage to his lower extremities.  In March 2019 he presented to the emergency department with several episodes of right visual changes.  He also described perioral tingling and some clumsiness in his left arm.  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  Ultrasound showed a nearly occlusive lesion in the right carotid artery which was confirmed with CTA.  He denies monocular blindness, upper extremity weakness or paralysis, slurred speech or facial drooping.    Right SFA stent placed in May 2018 by Dr. Oneida Alar for RLE claudication. Right CEA for symptomatic disease in March 2019 by Dr. [name]. On Eliquis for atrial fibrillation. Compliant with statin and fenofibrate. DM-2 on oral agents Continues to smoke  Past Medical History:  Diagnosis Date  . CAD (coronary artery disease)   . Cerebrovascular disease   . HLD (hyperlipidemia)   . HTN (hypertension)   . IBS (irritable bowel syndrome)   . Lumbar stenosis   . MI, old   . Persistent atrial fibrillation ([place])   . PONV (postoperative nausea and vomiting)   . Pre-diabetes   . PVD (peripheral vascular disease) ([place])   . Stroke (cerebrum) ([place])   . Tobacco abuse     Past Surgical History:  Procedure Laterality Date  . ABDOMINAL AORTOGRAM W/LOWER EXTREMITY N/A 01/25/2017   Procedure: Abdominal Aortogram w/Lower Extremity;  Surgeon: Elam Dutch, MD;  Location: [facility];  Service: Cardiovascular;   Laterality: N/A;  . ANTERIOR CRUCIATE LIGAMENT REPAIR    . CARDIAC CATHETERIZATION     [place]  . CARDIAC CATHETERIZATION N/A 07/27/2016   Procedure: Left Heart Cath and Coronary Angiography;  Surgeon: Sherren Mocha, MD;  Location: [facility];  Service: Cardiovascular;  Laterality: N/A;  . CARDIOVERSION N/A 06/29/2016   Procedure: CARDIOVERSION;  Surgeon: Fay Records, MD;  Location: [facility];  Service: Cardiovascular;  Laterality: N/A;  . CARDIOVERSION N/A 03/05/2018   Procedure: CARDIOVERSION;  Surgeon: Larey Dresser, MD;  Location: [facility] ENDOSCOPY;  Service: Cardiovascular;  Laterality: N/A;  . CHOLECYSTECTOMY    . ENDARTERECTOMY Right 12/06/2017   Procedure: ENDARTERECTOMY CAROTID RIGHT;  Surgeon: Serafina Mitchell, MD;  Location: [facility];  Service: Vascular;  Laterality: Right;  . [place], 2000   x 4 times.  . LOWER EXTREMITY ANGIOGRAPHY N/A 07/03/2019   Procedure: LOWER EXTREMITY ANGIOGRAPHY;  Surgeon: Elam Dutch, MD;  Location: [facility];  Service: Cardiovascular;  Laterality: N/A;  . LUMBAR LAMINECTOMY/DECOMPRESSION MICRODISCECTOMY Bilateral 09/09/2018   Procedure: Laminectomy and Foraminotomy bilateral Lumbar three-Lumbar four - Lumbar four-Lumbar five;  Surgeon: Earnie Larsson, MD;  Location: [facility];  Service: Neurosurgery;  Laterality: Bilateral;  . PATCH ANGIOPLASTY Right 12/06/2017   Procedure: PATCH ANGIOPLASTY RIGHT CAROTID ARTERY;  Surgeon: Serafina Mitchell, MD;  Location: [facility];  Service: Vascular;  Laterality: Right;  . PERIPHERAL VASCULAR INTERVENTION Right 01/25/2017   Procedure: Peripheral Vascular Intervention;  Surgeon: Elam Dutch, MD;  Location:  [facility];  Service: Cardiovascular;  Laterality: Right;  . PERIPHERAL VASCULAR INTERVENTION Right 07/03/2019   Procedure: PERIPHERAL VASCULAR INTERVENTION;  Surgeon: Elam Dutch, MD;  Location: [facility];  Service: Cardiovascular;  Laterality: Right;  .  TONSILLECTOMY      Social History   Socioeconomic History  . Marital status: Married    Spouse name: Not on file  . Number of children: 2  . Years of education: Not on file  . Highest education level: Not on file  Occupational History  . Occupation: retired  Tobacco Use  . Smoking status: Current Some Day Smoker    Packs/day: 0.50    Years: 50.00    Pack years: 25.00    Types: Cigarettes    Start date: 06/02/1971    Last attempt to quit: 12/09/2017    Years since quitting: 2.9  . Smokeless tobacco: Never Used  . Tobacco comment: pt vapes  Vaping Use  . Vaping Use: Some days  . Substances: Nicotine  Substance and Sexual Activity  . Alcohol use: Yes    Alcohol/week: 0.0 standard drinks    Comment: occasionaly  . Drug use: No  . Sexual activity: Not on file  Other Topics Concern  . Not on file  Social History Narrative   He is originally from [place]. He moved to [facility] in [place]. Previously has traveled to most of the [place]. Has traveled to [place]. Has been to [place], [place], & [place]. 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 Remote bird exposure in 1980. No mold exposure.    Social Determinants of Health   Financial Resource Strain: Not on file  Food Insecurity: Not on file  Transportation Needs: Not on file  Physical Activity: Not on file  Stress: Not on file  Social Connections: Not on file  Intimate Partner Violence: Not on file   Family History  Problem Relation Age of Onset  . Heart disease Father   . Melanoma Father   . Testicular cancer Father   . Hypertension Mother   . Arthritis Mother   . Arthritis Sister   . Lung disease Neg Hx     Current Outpatient Medications  Medication Sig Dispense Refill  . amLODipine (NORVASC) 10 MG tablet Take 10 mg by mouth at bedtime.     Marland Kitchen atorvastatin (LIPITOR) 40 MG tablet Take 1 tablet (40 mg total) by mouth daily. 30 tablet 0  . Carboxymethylcellul-Glycerin (REFRESH OPTIVE OP)  Place 1 drop into both eyes every morning.     . chlorthalidone (HYGROTON) 25 MG tablet TAKE 1 TABLET BY MOUTH EVERY DAY IN THE MORNING WITH FOOD    . diazepam (VALIUM) 2 MG tablet Take 2 mg by mouth 2 (two) times daily as needed.    Marland Kitchen ELIQUIS 5 MG TABS tablet TAKE 1 TABLET(5 MG) BY MOUTH TWICE DAILY 60 tablet 5  . fenofibrate (TRICOR) 145 MG tablet Take 145 mg by mouth daily.    Marland Kitchen gabapentin (NEURONTIN) 300 MG capsule 1 tab q hsx5 days,1 tab bidx5days,1 tab bid&2 tabs q hsx5days,2 tabs q am,1 tab q pm&2 tabs q hsx5days, then 2 tabs 3 times a day    . HYDROcodone-acetaminophen (NORCO/VICODIN) 5-325 MG tablet Take 1-2 tablets by mouth every 6 (six) hours as needed (for pain). 8 tablet 0  . losartan (COZAAR) 100 MG tablet Take 100 mg by mouth daily.    . metFORMIN (GLUCOPHAGE) 500 MG tablet Take 500  mg by mouth 2 (two) times daily with a meal.     . ondansetron (ZOFRAN) 4 MG tablet Take 4 mg by mouth every 8 (eight) hours as needed for nausea or vomiting.    . potassium chloride (KLOR-CON) 10 MEQ tablet Take 10 mEq by mouth every other day.    . Potassium Chloride ER 20 MEQ TBCR Take 1 tablet by mouth daily.    . sildenafil (VIAGRA) 100 MG tablet Take by mouth.    . spironolactone (ALDACTONE) 25 MG tablet Take 25 mg by mouth daily.    . valsartan (DIOVAN) 320 MG tablet Take 320 mg by mouth daily.     No current facility-administered medications for this visit.    Allergies  Allergen Reactions  . Augmentin [Amoxicillin-Pot Clavulanate] Rash     REVIEW OF SYSTEMS:   [X]  denotes positive finding, [ ]  denotes negative finding Cardiac  Comments:  Chest pain or chest pressure:    Shortness of breath upon exertion:    Short of breath when lying flat:    Irregular heart rhythm:        Vascular    Pain in calf, thigh, or hip brought on by ambulation:    Pain in feet at night that wakes you up from your sleep:     Blood clot in your veins:    Leg swelling:         Pulmonary    Oxygen at  home:    Productive cough:     Wheezing:         Neurologic    Sudden weakness in arms or legs:     Sudden numbness in arms or legs:     Sudden onset of difficulty speaking or slurred speech:    Temporary loss of vision in one eye:     Problems with dizziness:         Gastrointestinal    Blood in stool:     Vomited blood:         Genitourinary    Burning when urinating:     Blood in urine:        Psychiatric    Major depression:         Hematologic    Bleeding problems:    Problems with blood clotting too easily:        Skin    Rashes or ulcers:        Constitutional    Fever or chills:      PHYSICAL EXAMINATION:  Vitals:   11/10/20 1424 11/10/20 1427  BP: 110/65 115/89  Pulse: (!) 55   Resp: 16   Temp: 98.3 F (36.8 C)   TempSrc: Temporal   SpO2: 98%   Weight: 187 lb (84.8 kg)   Height: 5\' 9"  (1.753 m)     General:  WDWN in NAD; vital signs documented above Gait: Unaided, no ataxia HENT: WNL, normocephalic Pulmonary: normal non-labored breathing , without Rales, rhonchi,  wheezing Cardiac: regular HR, without  Murmurs without carotid bruit Abdomen: soft, NT, no masses Skin: without rashes Vascular Exam/Pulses: 2+ radial, right DP and left PT palpable pulses. Extremities: without ischemic changes, without Gangrene , without cellulitis; without open wounds;  Musculoskeletal: no muscle wasting or atrophy  Neurologic: A&O X 3;  No focal weakness or paresthesias are detected Psychiatric:  The pt has Normal affect.   Non-Invasive Vascular Imaging:   11/10/2020 Right Carotid: Velocities in the right ICA are consistent with a 1-39% stenosis. Patent  endarterectomy. Left Carotid: Velocities in the left ICA are consistent with a 1-39% stenosis. Vertebrals: Bilateral vertebral arteries demonstrate antegrade flow. Subclavian: Multiphasic waveforms bilaterally.  ABIs ABI/TBI Today's ABI Today's TBI Previous ABI Previous TBI Right 0.96 0.41 1.00 0.42 Left  0.90 0.54 0.94 0.68 Right toe pressure is 57 with biphasic and triphasic waveforms Left toe pressure is 75 with triphasic waveforms  RLE arterial duplex: Right: Patent stent with no evidence of stenosis in the Superficial femoral artery artery.   ASSESSMENT/PLAN:: 70 y.o. male here for follow up for peripheral arterial disease.  The patient has no lower extremity symptoms.  No tissue loss.  ABIs are very slightly decreased as compared to June 2021.  His stent is patent.  At his last visit, we discussed aspirin monotherapy, however he elects not to take aspirin because of his Eliquis use. Continue routine walking program.  Follow-up with ABIs and right lower extremity arterial duplex in 1 year  Carotid artery stenosis status post right carotid endarterectomy.  Neurologically intact without TIA or stroke symptoms.  We reviewed these and advised him to call EMS should these occur.  Follow-up in 1 year with carotid duplex.  Continue statin, blood sugar control and blood pressure control.  Encourage smoking cessation.  Barbie Banner, PA-C [facility] [phone]  Clinic MD:   Oneida Alar

## 2020-11-21 DIAGNOSIS — I10 Essential (primary) hypertension: Secondary | ICD-10-CM | POA: Diagnosis not present

## 2020-11-29 DIAGNOSIS — M20012 Mallet finger of left finger(s): Secondary | ICD-10-CM | POA: Diagnosis not present

## 2020-12-20 DIAGNOSIS — E7800 Pure hypercholesterolemia, unspecified: Secondary | ICD-10-CM | POA: Diagnosis not present

## 2020-12-20 DIAGNOSIS — M170 Bilateral primary osteoarthritis of knee: Secondary | ICD-10-CM | POA: Diagnosis not present

## 2020-12-20 DIAGNOSIS — E119 Type 2 diabetes mellitus without complications: Secondary | ICD-10-CM | POA: Diagnosis not present

## 2020-12-20 DIAGNOSIS — M1990 Unspecified osteoarthritis, unspecified site: Secondary | ICD-10-CM | POA: Diagnosis not present

## 2020-12-20 DIAGNOSIS — I2510 Atherosclerotic heart disease of native coronary artery without angina pectoris: Secondary | ICD-10-CM | POA: Diagnosis not present

## 2020-12-20 DIAGNOSIS — I480 Paroxysmal atrial fibrillation: Secondary | ICD-10-CM | POA: Diagnosis not present

## 2020-12-20 DIAGNOSIS — I10 Essential (primary) hypertension: Secondary | ICD-10-CM | POA: Diagnosis not present

## 2020-12-22 ENCOUNTER — Other Ambulatory Visit: Payer: Self-pay | Admitting: Cardiovascular Disease

## 2020-12-22 DIAGNOSIS — I10 Essential (primary) hypertension: Secondary | ICD-10-CM | POA: Diagnosis not present

## 2020-12-22 NOTE — Telephone Encounter (Addendum)
Eliquis 5mg  refill request received. Patient is 70 years old, weight-84.8kg, Crea-1.23 on 08/25/2020 via [facility] from [place], [place], and last seen by [name] on 10/26/2019-NEEDS AN APPT. Dose is appropriate based on dosing criteria.   Pt needs an appt with Cardiologist since last seen on 10/26/2019 and was due back in 6 months.   Staff message sent to scheduling dept to reach out for an appt.   Called pt since nothing documented that anyone had reached out in the scheduling dept. Spoke with pt and he is not out of Eliquis and will be willing to set an appt, transferred to scheduling dept. Pt is aware that I will send in the refill once appt scheduled.   Pt has an appt set for 01/17/2021 with Dr. Acie Fredrickson.

## 2020-12-27 DIAGNOSIS — M20012 Mallet finger of left finger(s): Secondary | ICD-10-CM | POA: Diagnosis not present

## 2021-01-02 DIAGNOSIS — H25013 Cortical age-related cataract, bilateral: Secondary | ICD-10-CM | POA: Diagnosis not present

## 2021-01-17 ENCOUNTER — Ambulatory Visit (INDEPENDENT_AMBULATORY_CARE_PROVIDER_SITE_OTHER): Payer: Medicare Other | Admitting: Cardiovascular Disease

## 2021-01-17 ENCOUNTER — Encounter: Payer: Self-pay | Admitting: Cardiovascular Disease

## 2021-01-17 ENCOUNTER — Other Ambulatory Visit: Payer: Self-pay

## 2021-01-17 VITALS — BP 118/66 | HR 59 | Ht 69.5 in | Wt 188.0 lb

## 2021-01-17 DIAGNOSIS — I779 Disorder of arteries and arterioles, unspecified: Secondary | ICD-10-CM

## 2021-01-17 DIAGNOSIS — I2510 Atherosclerotic heart disease of native coronary artery without angina pectoris: Secondary | ICD-10-CM | POA: Diagnosis not present

## 2021-01-17 DIAGNOSIS — I4819 Other persistent atrial fibrillation: Secondary | ICD-10-CM | POA: Diagnosis not present

## 2021-01-17 NOTE — Progress Notes (Signed)
Cardiology Office Note   Date:  01/17/2021   ID:  Scott [name], Scott [name] 24-Feb-1951, MRN [id number]  PCP:  Scott Frees, MD  Cardiologist:   Scott Moores, MD   Chief Complaint  Patient presents with  . Coronary Artery Disease  . PAD   Problem List 1. CAD - s/p Inf. MI 2. Anxiety 3. Hyperlipidemia 4. Essential hypertension 5. Atrial fibrillation-  S/p Afib ablation in [name] 2021 6. PAD - stenting of R SFA , Scott [name]  7. Right CEA - Scott [name] is a 70 y.o. male who presents for pre-op clearance prior to a colonoscopy.  He is on plavix and the GI team wanted permission to stop.  Feels fine.  No CP. Not breathing was well.  Since last fall, his breathing has not been as good.  Has had lots of bronchial type issues.   Has taked multiple rounds of antibiotics.   Has not been exercising .  Retired last year.    Still smoking .   Has been trying to cut back  Denies any palpitations.    Sept. 15, 2017: Scott [name] is here to follow up for  atrial fib  Is on the Eliquis regularly  Has been diagnosed with borderline DM  Has been seen by Dr. Ashok Cordia for shortness of breath .  Has had progressive shortness of breath . Has knee problems and shoulder issues Has been walking regularly  - walks 2 miles a day   Has not had any CP  No pains similar to his CP prior to stenting    Oct. 30, 2017:  Scott [name] is seen for follow up of his atrial fib .  Still short of breath  The cardioversion may have helped the shortness of breath but is not as good as he would like to be   Dec. 11 , 2017:  Scott [name] seems to be slightly  doing better. He had a cardiac catheterization on November 3. He had a widely patent left anterior descending artery. The right coronary artery stent was widely patent. The left circumflex artery was totally occluded and had right to left collaterals. His left ventricular systolic function was normal with an EF of 55-60%. He had normal left ventricular  end-diastolic pressure.  Staying busy with yard work  - getting up leaves .   Still smoking   Wants to consider cardioversion Flecainide is not an option due to CAD Amio is not a good choice due to possible  lung disease.   Aug. 22, 2018:  Scott [name] is seen today for follow-up visit. He had peripheral vascular stenting  in May with Dr. Oneida Alar. Is still smoking  Has had cardioversion for his atrial fib - only last a short time  Has been seen in the Afib clinic - discussed Tikosyn. Did not want to do that.  Has good days and bad days .  January 17, 2021: Scott [name] is seen today after a 4 year absence He has seen Scott [name], [place] via telemedicine last year Has hearing aids.  Has leg weakness.  Was found to have a a pinched nerve causing his leg weakness  Received steroid shots,  Had back surgery   Did not help as much as he would have hoped .  Had steroid injections   Had a cath - looked ok Had right carotid stenosis - had right CEA.  Had an afib ablation in [facility] - has been successful   Saw  neurosurgery again  2nd opinion at [name] .   EMGs show long term nerve damage   No recent CP ,    Has considerable leg and hip weakness with any amount of walking .     Past Medical History:  Diagnosis Date  . CAD (coronary artery disease)   . Cerebrovascular disease   . HLD (hyperlipidemia)   . HTN (hypertension)   . IBS (irritable bowel syndrome)   . Lumbar stenosis   . MI, old   . Persistent atrial fibrillation (Scott [name])   . PONV (postoperative nausea and vomiting)   . Pre-diabetes   . PVD (peripheral vascular disease) (Scott [name])   . Stroke (cerebrum) (Scott [name])   . Tobacco abuse     Past Surgical History:  Procedure Laterality Date  . ABDOMINAL AORTOGRAM W/LOWER EXTREMITY N/A 01/25/2017   Procedure: Abdominal Aortogram w/Lower Extremity;  Surgeon: Elam Dutch, MD;  Location: [name];  Service: Cardiovascular;  Laterality: N/A;  . ANTERIOR CRUCIATE LIGAMENT REPAIR    .  CARDIAC CATHETERIZATION     [place]  . CARDIAC CATHETERIZATION N/A 07/27/2016   Procedure: Left Heart Cath and Coronary Angiography;  Surgeon: Sherren Mocha, MD;  Location: [name];  Service: Cardiovascular;  Laterality: N/A;  . CARDIOVERSION N/A 06/29/2016   Procedure: CARDIOVERSION;  Surgeon: Fay Records, MD;  Location: [name];  Service: Cardiovascular;  Laterality: N/A;  . CARDIOVERSION N/A 03/05/2018   Procedure: CARDIOVERSION;  Surgeon: Larey Dresser, MD;  Location: [name];  Service: Cardiovascular;  Laterality: N/A;  . CHOLECYSTECTOMY    . ENDARTERECTOMY Right 12/06/2017   Procedure: ENDARTERECTOMY CAROTID RIGHT;  Surgeon: Serafina Mitchell, MD;  Location: [name];  Service: Vascular;  Laterality: Right;  . [place], 2000   x 4 times.  . LOWER EXTREMITY ANGIOGRAPHY N/A 07/03/2019   Procedure: LOWER EXTREMITY ANGIOGRAPHY;  Surgeon: Elam Dutch, MD;  Location: [name];  Service: Cardiovascular;  Laterality: N/A;  . LUMBAR LAMINECTOMY/DECOMPRESSION MICRODISCECTOMY Bilateral 09/09/2018   Procedure: Laminectomy and Foraminotomy bilateral Lumbar three-Lumbar four - Lumbar four-Lumbar five;  Surgeon: Earnie Larsson, MD;  Location: [name];  Service: Neurosurgery;  Laterality: Bilateral;  . PATCH ANGIOPLASTY Right 12/06/2017   Procedure: PATCH ANGIOPLASTY RIGHT CAROTID ARTERY;  Surgeon: Serafina Mitchell, MD;  Location: [name];  Service: Vascular;  Laterality: Right;  . PERIPHERAL VASCULAR INTERVENTION Right 01/25/2017   Procedure: Peripheral Vascular Intervention;  Surgeon: Elam Dutch, MD;  Location: [name];  Service: Cardiovascular;  Laterality: Right;  . PERIPHERAL VASCULAR INTERVENTION Right 07/03/2019   Procedure: PERIPHERAL VASCULAR INTERVENTION;  Surgeon: Elam Dutch, MD;  Location: [name];  Service: Cardiovascular;  Laterality: Right;  . TONSILLECTOMY       Current Outpatient Medications  Medication  Sig Dispense Refill  . amLODipine (NORVASC) 10 MG tablet Take 10 mg by mouth at bedtime.     Marland Kitchen atorvastatin (LIPITOR) 40 MG tablet Take 1 tablet (40 mg total) by mouth daily. 30 tablet 0  . Carboxymethylcellul-Glycerin (REFRESH OPTIVE OP) Place 1 drop into both eyes every morning.     . chlorthalidone (HYGROTON) 25 MG tablet TAKE 1 TABLET BY MOUTH EVERY DAY IN THE MORNING WITH FOOD    . diazepam (VALIUM) 2 MG tablet Take 2 mg by mouth 2 (two) times daily as needed.    Marland Kitchen ELIQUIS 5 MG TABS tablet TAKE 1 TABLET(5 MG) BY MOUTH TWICE DAILY 60  tablet 5  . fenofibrate (TRICOR) 145 MG tablet Take 145 mg by mouth daily.    Marland Kitchen gabapentin (NEURONTIN) 300 MG capsule 1 tab q hsx5 days,1 tab bidx5days,1 tab bid&2 tabs q hsx5days,2 tabs q am,1 tab q pm&2 tabs q hsx5days, then 2 tabs 3 times a day    . HYDROcodone-acetaminophen (NORCO/VICODIN) 5-325 MG tablet Take 1-2 tablets by mouth every 6 (six) hours as needed (for pain). 8 tablet 0  . metFORMIN (GLUCOPHAGE) 500 MG tablet Take 500 mg by mouth 2 (two) times daily with a meal.     . ondansetron (ZOFRAN) 4 MG tablet Take 4 mg by mouth every 8 (eight) hours as needed for nausea or vomiting.    . Potassium Chloride ER 20 MEQ TBCR Take 1 tablet by mouth daily.    . sildenafil (VIAGRA) 100 MG tablet Take by mouth.    . spironolactone (ALDACTONE) 25 MG tablet Take 25 mg by mouth daily.    . valsartan (DIOVAN) 320 MG tablet Take 320 mg by mouth daily.     No current facility-administered medications for this visit.    Allergies:   Augmentin [amoxicillin-pot clavulanate]    Social History:  The patient  reports that he has been smoking cigarettes. He started smoking about 49 years ago. He has a 25.00 pack-year smoking history. He has never used smokeless tobacco. He reports current alcohol use. He reports that he does not use drugs.   Family History:  [name] family history includes Arthritis in his mother and sister; Heart disease in his father;  Hypertension in his mother; Melanoma in his father; Testicular cancer in his father.    ROS:  Please see the history of present illness.      Physical Exam: Blood pressure 118/66, pulse (!) 59, height 5' 9.5" (1.765 m), weight 188 lb (85.3 kg), SpO2 98 %.  GEN:  Well nourished, well developed in no acute distress HEENT: Normal NECK: No JVD; No carotid bruits LYMPHATICS: No lymphadenopathy CARDIAC: RRR , no murmurs, rubs, gallops RESPIRATORY:  Clear to auscultation without rales, wheezing or rhonchi  ABDOMEN: Soft, non-tender, non-distended MUSCULOSKELETAL:  No edema; No deformity  SKIN: Warm and dry NEUROLOGIC:  Alert and oriented x 3   EKG:  January 17, 2021:  Sinus brady at 47.  No St or T wave change.   Recent Labs: No results found for requested labs within last 8760 hours.    Lipid Panel    Component Value Date/Time   CHOL 125 12/05/2017 0858   CHOL 146 10/23/2017 0933   TRIG 51 12/05/2017 0858   HDL 40 (L) 12/05/2017 0858   HDL 37 (L) 10/23/2017 0933   CHOLHDL 3.1 12/05/2017 0858   VLDL 10 12/05/2017 0858   LDLCALC 75 12/05/2017 0858   LDLCALC 88 10/23/2017 0933      Wt Readings from Last 3 Encounters:  01/17/21 188 lb (85.3 kg)  11/10/20 187 lb (84.8 kg)  03/14/20 181 lb 1.6 oz (82.1 kg)      Other studies Reviewed: Additional studies/ records that were reviewed today include: . Review of the above records demonstrates:    ASSESSMENT AND PLAN:  1.  Atrial fib:    His CHADS2 VASC score is 2 ( CAD, HTN) .  He status post ablation in [place].  He is maintaining sinus rhythm. He has been on Eliquis.    .   2. CAD :    He has not had any episodes of angina.  3. Essential hypertension:   Blood pressures been stable.   4. Hyperlipidemia: Check lipids today, ALT, basic metabolic profile.  Continue current medications.  Current medicines are reviewed at length with the patient today.  The patient does not have concerns regarding medicines.  The  following changes have been made:  no change  Labs/ tests ordered today include:   Orders Placed This Encounter  Procedures  . ALT  . Basic metabolic panel  . Lipid panel  . EKG 12-Lead   Disposition:         Scott Moores, MD  01/17/2021 1:59 PM    [facility] [address], [place], [place]  [address] Phone: [phone]; Fax: [phone]

## 2021-01-17 NOTE — Patient Instructions (Signed)
Medication Instructions:  Your physician recommends that you continue on your current medications as directed. Please refer to the Current Medication list given to you today.  *If you need a refill on your cardiac medications before your next appointment, please call your pharmacy*   Lab Work: TODAY: Lipids, ALT,BMET If you have labs (blood work) drawn today and your tests are completely normal, you will receive your results only by: . MyChart Message (if you have MyChart) OR . A paper copy in the mail If you have any lab test that is abnormal or we need to change your treatment, we will call you to review the results.   Testing/Procedures: none   Follow-Up: At [facility], you and your health needs are our priority.  As part of our continuing mission to provide you with exceptional heart care, we have created designated Provider Care Teams.  These Care Teams include your primary Cardiologist (physician) and Advanced Practice Providers (APPs -  Physician Assistants and Nurse Practitioners) who all work together to provide you with the care you need, when you need it.  We recommend signing up for the patient portal called "MyChart".  Sign up information is provided on this After Visit Summary.  MyChart is used to connect with patients for Virtual Visits (Telemedicine).  Patients are able to view lab/test results, encounter notes, upcoming appointments, etc.  Non-urgent messages can be sent to your provider as well.   To learn more about what you can do with MyChart, go to [email or web address].    Your next appointment:   1 year(s)  The format for your next appointment:   In Person  Provider:   You may see Philip [name], MD or one of the following Advanced Practice Providers on your designated Care Team:    Scott Weaver, PA-C  Vin Bhagat, PA-C    

## 2021-01-18 LAB — LIPID PANEL
Chol/HDL Ratio: 4.8 ratio (ref 0.0–5.0)
Cholesterol, Total: 188 mg/dL (ref 100–199)
HDL: 39 mg/dL — ABNORMAL LOW (ref >39)
LDL Chol Calc (NIH): 118 mg/dL — ABNORMAL HIGH (ref 0–99)
Triglycerides: 174 mg/dL — ABNORMAL HIGH (ref 0–149)
VLDL Cholesterol Cal: 31 mg/dL (ref 5–40)

## 2021-01-18 LAB — ALT: ALT: 16 IU/L (ref 0–44)

## 2021-01-18 LAB — BASIC METABOLIC PANEL
BUN/Creatinine Ratio: 18 (ref 10–24)
BUN: 25 mg/dL (ref 8–27)
CO2: 23 mmol/L (ref 20–29)
Calcium: 9.6 mg/dL (ref 8.6–10.2)
Chloride: 103 mmol/L (ref 96–106)
Creatinine, Ser: 1.41 mg/dL — ABNORMAL HIGH (ref 0.76–1.27)
Glucose: 181 mg/dL — ABNORMAL HIGH (ref 65–99)
Potassium: 4.3 mmol/L (ref 3.5–5.2)
Sodium: 140 mmol/L (ref 134–144)
eGFR: 55 mL/min/{1.73_m2} — ABNORMAL LOW (ref >59)

## 2021-01-20 DIAGNOSIS — E7800 Pure hypercholesterolemia, unspecified: Secondary | ICD-10-CM | POA: Diagnosis not present

## 2021-01-20 DIAGNOSIS — E119 Type 2 diabetes mellitus without complications: Secondary | ICD-10-CM | POA: Diagnosis not present

## 2021-01-20 DIAGNOSIS — I10 Essential (primary) hypertension: Secondary | ICD-10-CM | POA: Diagnosis not present

## 2021-01-20 DIAGNOSIS — M170 Bilateral primary osteoarthritis of knee: Secondary | ICD-10-CM | POA: Diagnosis not present

## 2021-01-20 DIAGNOSIS — M1990 Unspecified osteoarthritis, unspecified site: Secondary | ICD-10-CM | POA: Diagnosis not present

## 2021-01-20 DIAGNOSIS — I480 Paroxysmal atrial fibrillation: Secondary | ICD-10-CM | POA: Diagnosis not present

## 2021-01-20 DIAGNOSIS — I2510 Atherosclerotic heart disease of native coronary artery without angina pectoris: Secondary | ICD-10-CM | POA: Diagnosis not present

## 2021-01-24 ENCOUNTER — Telehealth: Payer: Self-pay | Admitting: Cardiovascular Disease

## 2021-01-24 NOTE — Telephone Encounter (Signed)
Thayer Headings, MD  01/20/2021 5:55 PM EDT      ALT is stable  Glucose is elevated - he we will need to discuss this with his primary doctor. Creatinine is up slightly. He may need to drink more water. Continue other medications. Triglyceride levels are mildly elevated. LDL cholesterol is 118. He has known coronary artery disease and peripheral vascular disease. I do not think that the atorvastatin can be stronger. Discontinue atorvastatin and start rosuvastatin 20 mg a day. Check ALT and lipid profile in 3 months.   The patient has been notified of the result and verbalized understanding.  All questions (if any) were answered. Antonieta Iba, RN 01/24/2021 5:07 PM    Patient states that he was not fasting for this lab work. He had eaten right before he came for the appointment. He states that he is willing to change statins if Dr. Acie Fredrickson would still like for him to. He states that his PCP will be checking his cholesterol in July and he will be fasting for that appointment.

## 2021-01-24 NOTE — Telephone Encounter (Signed)
Scott [name] is returning [place] call in regards to his lab results. Please advise.

## 2021-01-25 NOTE — Telephone Encounter (Signed)
Left message for patient to call back  

## 2021-02-02 MED ORDER — ROSUVASTATIN CALCIUM 20 MG PO TABS: 20.0000 mg | ORAL_TABLET | Freq: Every day | ORAL | 3 refills | Status: DC

## 2021-02-02 NOTE — Telephone Encounter (Signed)
RN left message (DPR) updating of Dr. Elmarie Shiley recommendations of switching statins. Orders entered for Rosuvastatin 20mg  daily. Instructed patient to contact the office with any questions or concerns.

## 2021-02-08 DIAGNOSIS — M25531 Pain in right wrist: Secondary | ICD-10-CM | POA: Diagnosis not present

## 2021-02-21 ENCOUNTER — Telehealth: Payer: Self-pay

## 2021-02-21 DIAGNOSIS — I2510 Atherosclerotic heart disease of native coronary artery without angina pectoris: Secondary | ICD-10-CM

## 2021-02-21 DIAGNOSIS — E119 Type 2 diabetes mellitus without complications: Secondary | ICD-10-CM | POA: Diagnosis not present

## 2021-02-21 NOTE — Telephone Encounter (Signed)
-----   Message from Thayer Headings, MD sent at 01/20/2021  5:55 PM EDT ----- ALT is stable  Glucose is elevated - he we will need to discuss this with his primary doctor. Creatinine is up slightly.  He may need to drink more water.  Continue other medications. Triglyceride levels are mildly elevated.  LDL cholesterol is 118. He has known coronary artery disease and peripheral vascular disease.  I do not think that the atorvastatin can be stronger.  Discontinue atorvastatin and start rosuvastatin 20 mg a day.  Check ALT and lipid profile in 3 months.

## 2021-02-24 DIAGNOSIS — I739 Peripheral vascular disease, unspecified: Secondary | ICD-10-CM | POA: Diagnosis not present

## 2021-02-24 DIAGNOSIS — G894 Chronic pain syndrome: Secondary | ICD-10-CM | POA: Diagnosis not present

## 2021-02-24 DIAGNOSIS — D6869 Other thrombophilia: Secondary | ICD-10-CM | POA: Diagnosis not present

## 2021-02-24 DIAGNOSIS — E291 Testicular hypofunction: Secondary | ICD-10-CM | POA: Diagnosis not present

## 2021-02-24 DIAGNOSIS — I10 Essential (primary) hypertension: Secondary | ICD-10-CM | POA: Diagnosis not present

## 2021-02-24 DIAGNOSIS — I480 Paroxysmal atrial fibrillation: Secondary | ICD-10-CM | POA: Diagnosis not present

## 2021-02-24 DIAGNOSIS — I2510 Atherosclerotic heart disease of native coronary artery without angina pectoris: Secondary | ICD-10-CM | POA: Diagnosis not present

## 2021-02-24 DIAGNOSIS — E7800 Pure hypercholesterolemia, unspecified: Secondary | ICD-10-CM | POA: Diagnosis not present

## 2021-02-24 DIAGNOSIS — E1169 Type 2 diabetes mellitus with other specified complication: Secondary | ICD-10-CM | POA: Diagnosis not present

## 2021-02-24 DIAGNOSIS — J439 Emphysema, unspecified: Secondary | ICD-10-CM | POA: Diagnosis not present

## 2021-02-24 DIAGNOSIS — F17200 Nicotine dependence, unspecified, uncomplicated: Secondary | ICD-10-CM | POA: Diagnosis not present

## 2021-03-19 DIAGNOSIS — Z20822 Contact with and (suspected) exposure to covid-19: Secondary | ICD-10-CM | POA: Diagnosis not present

## 2021-04-10 ENCOUNTER — Telehealth: Payer: Self-pay | Admitting: Cardiovascular Disease

## 2021-04-10 DIAGNOSIS — I10 Essential (primary) hypertension: Secondary | ICD-10-CM | POA: Diagnosis not present

## 2021-04-10 DIAGNOSIS — M170 Bilateral primary osteoarthritis of knee: Secondary | ICD-10-CM | POA: Diagnosis not present

## 2021-04-10 DIAGNOSIS — E7800 Pure hypercholesterolemia, unspecified: Secondary | ICD-10-CM | POA: Diagnosis not present

## 2021-04-10 DIAGNOSIS — E119 Type 2 diabetes mellitus without complications: Secondary | ICD-10-CM | POA: Diagnosis not present

## 2021-04-10 DIAGNOSIS — E1169 Type 2 diabetes mellitus with other specified complication: Secondary | ICD-10-CM | POA: Diagnosis not present

## 2021-04-10 DIAGNOSIS — M1990 Unspecified osteoarthritis, unspecified site: Secondary | ICD-10-CM | POA: Diagnosis not present

## 2021-04-10 DIAGNOSIS — I2510 Atherosclerotic heart disease of native coronary artery without angina pectoris: Secondary | ICD-10-CM | POA: Diagnosis not present

## 2021-04-10 DIAGNOSIS — I480 Paroxysmal atrial fibrillation: Secondary | ICD-10-CM | POA: Diagnosis not present

## 2021-04-10 DIAGNOSIS — J439 Emphysema, unspecified: Secondary | ICD-10-CM | POA: Diagnosis not present

## 2021-04-10 DIAGNOSIS — E118 Type 2 diabetes mellitus with unspecified complications: Secondary | ICD-10-CM

## 2021-04-10 NOTE — Telephone Encounter (Signed)
Ok to add an A1C to his upcoming labs?

## 2021-04-10 NOTE — Telephone Encounter (Signed)
Left detailed message letting pt know that I will add the A1C and we can send that over to his PCP once received.  Advised to call back if any questions.

## 2021-04-10 NOTE — Telephone Encounter (Signed)
Patient has upcoming lab work and states his PCP at [facility] wanted him to ask to add an A1C order to the lab work.

## 2021-04-19 DIAGNOSIS — Z23 Encounter for immunization: Secondary | ICD-10-CM | POA: Diagnosis not present

## 2021-04-26 ENCOUNTER — Other Ambulatory Visit: Payer: Medicare Other | Admitting: *Deleted

## 2021-04-26 ENCOUNTER — Other Ambulatory Visit: Payer: Self-pay

## 2021-04-26 DIAGNOSIS — E118 Type 2 diabetes mellitus with unspecified complications: Secondary | ICD-10-CM | POA: Diagnosis not present

## 2021-04-26 DIAGNOSIS — I2510 Atherosclerotic heart disease of native coronary artery without angina pectoris: Secondary | ICD-10-CM | POA: Diagnosis not present

## 2021-04-27 LAB — LIPID PANEL
Chol/HDL Ratio: 4.3 ratio (ref 0.0–5.0)
Cholesterol, Total: 147 mg/dL (ref 100–199)
HDL: 34 mg/dL — ABNORMAL LOW (ref >39)
LDL Chol Calc (NIH): 80 mg/dL (ref 0–99)
Triglycerides: 197 mg/dL — ABNORMAL HIGH (ref 0–149)
VLDL Cholesterol Cal: 33 mg/dL (ref 5–40)

## 2021-04-27 LAB — ALT: ALT: 11 IU/L (ref 0–44)

## 2021-04-27 LAB — HEMOGLOBIN A1C
Est. average glucose Bld gHb Est-mCnc: 177 mg/dL
Hgb A1c MFr Bld: 7.8 % — ABNORMAL HIGH (ref 4.8–5.6)

## 2021-05-17 ENCOUNTER — Telehealth: Payer: Self-pay | Admitting: Cardiovascular Disease

## 2021-05-17 NOTE — Telephone Encounter (Signed)
   Denton Ar with [facility] calling to get a copy of pt's recent labs result. She gave fax# [phone]

## 2021-05-17 NOTE — Telephone Encounter (Signed)
Lab results faxed to requested number.

## 2021-05-18 DIAGNOSIS — I2510 Atherosclerotic heart disease of native coronary artery without angina pectoris: Secondary | ICD-10-CM | POA: Diagnosis not present

## 2021-05-18 DIAGNOSIS — J439 Emphysema, unspecified: Secondary | ICD-10-CM | POA: Diagnosis not present

## 2021-05-18 DIAGNOSIS — E119 Type 2 diabetes mellitus without complications: Secondary | ICD-10-CM | POA: Diagnosis not present

## 2021-05-18 DIAGNOSIS — E7800 Pure hypercholesterolemia, unspecified: Secondary | ICD-10-CM | POA: Diagnosis not present

## 2021-05-18 DIAGNOSIS — I480 Paroxysmal atrial fibrillation: Secondary | ICD-10-CM | POA: Diagnosis not present

## 2021-05-18 DIAGNOSIS — M1990 Unspecified osteoarthritis, unspecified site: Secondary | ICD-10-CM | POA: Diagnosis not present

## 2021-05-18 DIAGNOSIS — M170 Bilateral primary osteoarthritis of knee: Secondary | ICD-10-CM | POA: Diagnosis not present

## 2021-05-18 DIAGNOSIS — I10 Essential (primary) hypertension: Secondary | ICD-10-CM | POA: Diagnosis not present

## 2021-05-18 DIAGNOSIS — E1169 Type 2 diabetes mellitus with other specified complication: Secondary | ICD-10-CM | POA: Diagnosis not present

## 2021-06-02 DIAGNOSIS — J439 Emphysema, unspecified: Secondary | ICD-10-CM | POA: Diagnosis not present

## 2021-06-02 DIAGNOSIS — I10 Essential (primary) hypertension: Secondary | ICD-10-CM | POA: Diagnosis not present

## 2021-06-02 DIAGNOSIS — M170 Bilateral primary osteoarthritis of knee: Secondary | ICD-10-CM | POA: Diagnosis not present

## 2021-06-02 DIAGNOSIS — E119 Type 2 diabetes mellitus without complications: Secondary | ICD-10-CM | POA: Diagnosis not present

## 2021-06-02 DIAGNOSIS — E7800 Pure hypercholesterolemia, unspecified: Secondary | ICD-10-CM | POA: Diagnosis not present

## 2021-06-02 DIAGNOSIS — M1990 Unspecified osteoarthritis, unspecified site: Secondary | ICD-10-CM | POA: Diagnosis not present

## 2021-06-02 DIAGNOSIS — I2510 Atherosclerotic heart disease of native coronary artery without angina pectoris: Secondary | ICD-10-CM | POA: Diagnosis not present

## 2021-06-02 DIAGNOSIS — E1169 Type 2 diabetes mellitus with other specified complication: Secondary | ICD-10-CM | POA: Diagnosis not present

## 2021-06-02 DIAGNOSIS — I480 Paroxysmal atrial fibrillation: Secondary | ICD-10-CM | POA: Diagnosis not present

## 2021-07-06 DIAGNOSIS — M170 Bilateral primary osteoarthritis of knee: Secondary | ICD-10-CM | POA: Diagnosis not present

## 2021-07-06 DIAGNOSIS — I10 Essential (primary) hypertension: Secondary | ICD-10-CM | POA: Diagnosis not present

## 2021-07-06 DIAGNOSIS — E7800 Pure hypercholesterolemia, unspecified: Secondary | ICD-10-CM | POA: Diagnosis not present

## 2021-07-06 DIAGNOSIS — I2510 Atherosclerotic heart disease of native coronary artery without angina pectoris: Secondary | ICD-10-CM | POA: Diagnosis not present

## 2021-07-06 DIAGNOSIS — J439 Emphysema, unspecified: Secondary | ICD-10-CM | POA: Diagnosis not present

## 2021-07-06 DIAGNOSIS — E1169 Type 2 diabetes mellitus with other specified complication: Secondary | ICD-10-CM | POA: Diagnosis not present

## 2021-07-06 DIAGNOSIS — E119 Type 2 diabetes mellitus without complications: Secondary | ICD-10-CM | POA: Diagnosis not present

## 2021-07-06 DIAGNOSIS — I480 Paroxysmal atrial fibrillation: Secondary | ICD-10-CM | POA: Diagnosis not present

## 2021-07-10 ENCOUNTER — Other Ambulatory Visit: Payer: Self-pay | Admitting: *Deleted

## 2021-07-10 MED ORDER — APIXABAN 5 MG PO TABS: ORAL_TABLET | ORAL | 5 refills | Status: DC

## 2021-07-10 NOTE — Telephone Encounter (Signed)
Eliquis 5mg  refill request received. Patient is 70 years old, weight-85.3kg, Crea-1.41 on 01/17/2021, Diagnosis-Afib, and last seen by Dr. Acie Fredrickson on 01/17/2021. Dose is appropriate based on dosing criteria. Will send in refill to requested pharmacy.

## 2021-07-17 DIAGNOSIS — Z23 Encounter for immunization: Secondary | ICD-10-CM | POA: Diagnosis not present

## 2021-08-21 DIAGNOSIS — R0981 Nasal congestion: Secondary | ICD-10-CM | POA: Diagnosis not present

## 2021-08-21 DIAGNOSIS — R051 Acute cough: Secondary | ICD-10-CM | POA: Diagnosis not present

## 2021-08-21 DIAGNOSIS — U071 COVID-19: Secondary | ICD-10-CM | POA: Diagnosis not present

## 2021-09-05 DIAGNOSIS — E1169 Type 2 diabetes mellitus with other specified complication: Secondary | ICD-10-CM | POA: Diagnosis not present

## 2021-09-05 DIAGNOSIS — D6869 Other thrombophilia: Secondary | ICD-10-CM | POA: Diagnosis not present

## 2021-09-05 DIAGNOSIS — I739 Peripheral vascular disease, unspecified: Secondary | ICD-10-CM | POA: Diagnosis not present

## 2021-09-05 DIAGNOSIS — E7800 Pure hypercholesterolemia, unspecified: Secondary | ICD-10-CM | POA: Diagnosis not present

## 2021-09-05 DIAGNOSIS — Z0000 Encounter for general adult medical examination without abnormal findings: Secondary | ICD-10-CM | POA: Diagnosis not present

## 2021-09-05 DIAGNOSIS — G894 Chronic pain syndrome: Secondary | ICD-10-CM | POA: Diagnosis not present

## 2021-09-05 DIAGNOSIS — I480 Paroxysmal atrial fibrillation: Secondary | ICD-10-CM | POA: Diagnosis not present

## 2021-09-05 DIAGNOSIS — M170 Bilateral primary osteoarthritis of knee: Secondary | ICD-10-CM | POA: Diagnosis not present

## 2021-09-05 DIAGNOSIS — J439 Emphysema, unspecified: Secondary | ICD-10-CM | POA: Diagnosis not present

## 2021-09-05 DIAGNOSIS — I2510 Atherosclerotic heart disease of native coronary artery without angina pectoris: Secondary | ICD-10-CM | POA: Diagnosis not present

## 2021-09-05 DIAGNOSIS — I10 Essential (primary) hypertension: Secondary | ICD-10-CM | POA: Diagnosis not present

## 2021-09-05 DIAGNOSIS — Z125 Encounter for screening for malignant neoplasm of prostate: Secondary | ICD-10-CM | POA: Diagnosis not present

## 2021-10-18 DIAGNOSIS — J439 Emphysema, unspecified: Secondary | ICD-10-CM | POA: Diagnosis not present

## 2021-10-18 DIAGNOSIS — M1990 Unspecified osteoarthritis, unspecified site: Secondary | ICD-10-CM | POA: Diagnosis not present

## 2021-10-18 DIAGNOSIS — I10 Essential (primary) hypertension: Secondary | ICD-10-CM | POA: Diagnosis not present

## 2021-10-18 DIAGNOSIS — I2510 Atherosclerotic heart disease of native coronary artery without angina pectoris: Secondary | ICD-10-CM | POA: Diagnosis not present

## 2021-10-18 DIAGNOSIS — E1169 Type 2 diabetes mellitus with other specified complication: Secondary | ICD-10-CM | POA: Diagnosis not present

## 2021-10-18 DIAGNOSIS — I480 Paroxysmal atrial fibrillation: Secondary | ICD-10-CM | POA: Diagnosis not present

## 2021-10-18 DIAGNOSIS — M170 Bilateral primary osteoarthritis of knee: Secondary | ICD-10-CM | POA: Diagnosis not present

## 2021-10-18 DIAGNOSIS — E7800 Pure hypercholesterolemia, unspecified: Secondary | ICD-10-CM | POA: Diagnosis not present

## 2021-10-20 ENCOUNTER — Telehealth: Payer: Self-pay | Admitting: Cardiovascular Disease

## 2021-10-20 DIAGNOSIS — E785 Hyperlipidemia, unspecified: Secondary | ICD-10-CM

## 2021-10-20 DIAGNOSIS — I2510 Atherosclerotic heart disease of native coronary artery without angina pectoris: Secondary | ICD-10-CM

## 2021-10-20 NOTE — Telephone Encounter (Signed)
Pt c/o medication issue:  1. Name of Medication:  rosuvastatin (CRESTOR) 20 MG tablet  2. How are you currently taking this medication (dosage and times per day)?   3. Are you having a reaction (difficulty breathing--STAT)?   4. What is your medication issue?   Denton Ar with [facility] states the patient had labs with them in December, 2022 and his ldl was at 32 (she plans to fax the results to the office). She states at that time they decreased his fenofibrate to 48 MG once daily due to risk of chronic kidney disease. She would like to know if it will be alright to increase his Rosuvastatin from 20 MG to 40 MG.

## 2021-10-24 NOTE — Telephone Encounter (Signed)
[facility] called back wanting to confirm if Dr. Acie Fredrickson agreed to increasing patient's rosuvastatin. Advised them of Dr. Elmarie Shiley listed documentation. [facility] advised they will send in the increased dose and documented Dr. Acie Fredrickson wanting Lipid and ALT in 3 months.

## 2021-11-06 NOTE — Telephone Encounter (Signed)
Lm to call back ./cy 

## 2021-11-07 NOTE — Telephone Encounter (Signed)
Pt has increased Crestor and will come in first week of June for repeat fasting labs ./cy

## 2021-11-15 DIAGNOSIS — Z20822 Contact with and (suspected) exposure to covid-19: Secondary | ICD-10-CM | POA: Diagnosis not present

## 2021-11-16 ENCOUNTER — Telehealth: Payer: Self-pay | Admitting: Acute Care

## 2021-11-16 NOTE — Telephone Encounter (Signed)
Left message for pt to call to scheduled f/u lung screening CT scan.

## 2021-11-21 ENCOUNTER — Other Ambulatory Visit: Payer: Self-pay | Admitting: *Deleted

## 2021-11-21 DIAGNOSIS — I6523 Occlusion and stenosis of bilateral carotid arteries: Secondary | ICD-10-CM

## 2021-11-21 DIAGNOSIS — Z95820 Peripheral vascular angioplasty status with implants and grafts: Secondary | ICD-10-CM

## 2021-11-21 DIAGNOSIS — Z9889 Other specified postprocedural states: Secondary | ICD-10-CM

## 2021-11-21 DIAGNOSIS — I779 Disorder of arteries and arterioles, unspecified: Secondary | ICD-10-CM

## 2021-11-23 DIAGNOSIS — D485 Neoplasm of uncertain behavior of skin: Secondary | ICD-10-CM | POA: Diagnosis not present

## 2021-11-23 DIAGNOSIS — D225 Melanocytic nevi of trunk: Secondary | ICD-10-CM | POA: Diagnosis not present

## 2021-11-23 DIAGNOSIS — Z1283 Encounter for screening for malignant neoplasm of skin: Secondary | ICD-10-CM | POA: Diagnosis not present

## 2021-12-01 ENCOUNTER — Ambulatory Visit (INDEPENDENT_AMBULATORY_CARE_PROVIDER_SITE_OTHER)
Admission: RE | Admit: 2021-12-01 | Discharge: 2021-12-01 | Disposition: A | Discharge status: Home / Self Care | Payer: Medicare Other | Source: Ambulatory Visit | Attending: Surgery | Admitting: Surgery

## 2021-12-01 ENCOUNTER — Other Ambulatory Visit: Payer: Self-pay

## 2021-12-01 ENCOUNTER — Ambulatory Visit (HOSPITAL_COMMUNITY)
Admission: RE | Admit: 2021-12-01 | Discharge: 2021-12-01 | Disposition: A | Discharge status: Home / Self Care | Payer: Medicare Other | Source: Ambulatory Visit | Attending: Surgery | Admitting: Surgery

## 2021-12-01 ENCOUNTER — Ambulatory Visit: Payer: Medicare Other

## 2021-12-01 DIAGNOSIS — Z9889 Other specified postprocedural states: Secondary | ICD-10-CM | POA: Insufficient documentation

## 2021-12-01 DIAGNOSIS — Z95820 Peripheral vascular angioplasty status with implants and grafts: Secondary | ICD-10-CM

## 2021-12-01 DIAGNOSIS — I779 Disorder of arteries and arterioles, unspecified: Secondary | ICD-10-CM | POA: Diagnosis not present

## 2021-12-01 DIAGNOSIS — I6523 Occlusion and stenosis of bilateral carotid arteries: Secondary | ICD-10-CM | POA: Insufficient documentation

## 2021-12-08 DIAGNOSIS — H6121 Impacted cerumen, right ear: Secondary | ICD-10-CM | POA: Diagnosis not present

## 2021-12-12 ENCOUNTER — Other Ambulatory Visit: Payer: Self-pay | Admitting: Cardiovascular Disease

## 2021-12-12 NOTE — Telephone Encounter (Signed)
Pt last saw Dr Acie Fredrickson 01/17/21, last labs 09/05/21 Creat 1.29 at [facility] per [facility], age 71, weight 85.3kg, based on specified criteria pt is on appropriate dosage of Eliquis '5mg'$  BID for afib.  Will refill rx.  ?Message sent to schedulers to contact pt to make follow up appt pt due to be seen April 2023, recall in [place].  ?

## 2021-12-14 NOTE — Progress Notes (Signed)
?Office Note  ? ? ? ?CC:  follow up ?Requesting Provider:  Shirline Frees, MD ? ?HPI: Scott [name] is a 71 y.o. ([birth year]) male who presents for follow up of PAD and carotid artery stenosis. He has history of Right SFA stent placed in May 2018 by Dr. Oneida Alar for RLE claudication and Right CEA for symptomatic disease in March 2019 by Dr. [name]. He had right brain stroke.  At his last visit in February of 2022 he was not having any symptoms associated to his PAD or carotid disease.  ? ?Today he denies any claudication, rest pain or tissue loss. He does report some generalized weakness and unsteadiness in his legs. He says this previously was due to some issues with his back and nerves. He is taking Gabapentin but feels it is no helpful. Was told by orthopedics as well as neurologist there is nothing further they are able to do. He is trying to remain active but is very challenged depending on how his legs feel.  ? ?Regarding is carotid disease he has concerns about continued dizziness. This mostly occurs when bending over or if he goes from sitting to standing especially if quickly. He has been monitoring his blood pressure and at times it has been low. He explains that he has had several falls recently as a result. Feels like between his legs being weak and the dizziness he has really had to cut back on the things he enjoys doing like golfing and yard work. He denies any visual changes, slurred speech, facial drooping, unilateral upper or lower extremity weakness or numbness ? ?The pt is on a statin for cholesterol management.  ?The pt is not on a daily aspirin.   Other AC:  Eliquis ?The pt is on ARB, CCB for hypertension.   ?The pt is diabetic.   ?Tobacco hx:  current, 1/2 ppd ? ?Past Medical History:  ?Diagnosis Date  ? CAD (coronary artery disease)   ? Cerebrovascular disease   ? HLD (hyperlipidemia)   ? HTN (hypertension)   ? IBS (irritable bowel syndrome)   ? Lumbar stenosis   ? MI, old   ? Persistent  atrial fibrillation ([place])   ? PONV (postoperative nausea and vomiting)   ? Pre-diabetes   ? PVD (peripheral vascular disease) ([place])   ? Stroke (cerebrum) ([place])   ? Tobacco abuse   ? ? ?Past Surgical History:  ?Procedure Laterality Date  ? ABDOMINAL AORTOGRAM W/LOWER EXTREMITY N/A 01/25/2017  ? Procedure: Abdominal Aortogram w/Lower Extremity;  Surgeon: Elam Dutch, MD;  Location: [place] [facility];  Service: Cardiovascular;  Laterality: N/A;  ? ANTERIOR CRUCIATE LIGAMENT REPAIR    ? CARDIAC CATHETERIZATION    ? [place]  ? CARDIAC CATHETERIZATION N/A 07/27/2016  ? Procedure: Left Heart Cath and Coronary Angiography;  Surgeon: Sherren Mocha, MD;  Location: [place] [facility];  Service: Cardiovascular;  Laterality: N/A;  ? CARDIOVERSION N/A 06/29/2016  ? Procedure: CARDIOVERSION;  Surgeon: Fay Records, MD;  Location: [place];  Service: Cardiovascular;  Laterality: N/A;  ? CARDIOVERSION N/A 03/05/2018  ? Procedure: CARDIOVERSION;  Surgeon: Larey Dresser, MD;  Location: [facility] ENDOSCOPY;  Service: Cardiovascular;  Laterality: N/A;  ? CHOLECYSTECTOMY    ? ENDARTERECTOMY Right 12/06/2017  ? Procedure: ENDARTERECTOMY CAROTID RIGHT;  Surgeon: Serafina Mitchell, MD;  Location: [place];  Service: Vascular;  Laterality: Right;  ? [place], 2000  ? x 4 times.  ? LOWER EXTREMITY ANGIOGRAPHY N/A  07/03/2019  ? Procedure: LOWER EXTREMITY ANGIOGRAPHY;  Surgeon: Elam Dutch, MD;  Location: [place] [facility];  Service: Cardiovascular;  Laterality: N/A;  ? LUMBAR LAMINECTOMY/DECOMPRESSION MICRODISCECTOMY Bilateral 09/09/2018  ? Procedure: Laminectomy and Foraminotomy bilateral Lumbar three-Lumbar four - Lumbar four-Lumbar five;  Surgeon: Earnie Larsson, MD;  Location: [place];  Service: Neurosurgery;  Laterality: Bilateral;  ? PATCH ANGIOPLASTY Right 12/06/2017  ? Procedure: PATCH ANGIOPLASTY RIGHT CAROTID ARTERY;  Surgeon: Serafina Mitchell, MD;  Location: [facility] OR;  Service: Vascular;  Laterality: Right;  ?  PERIPHERAL VASCULAR INTERVENTION Right 01/25/2017  ? Procedure: Peripheral Vascular Intervention;  Surgeon: Elam Dutch, MD;  Location: [place] [facility];  Service: Cardiovascular;  Laterality: Right;  ? PERIPHERAL VASCULAR INTERVENTION Right 07/03/2019  ? Procedure: PERIPHERAL VASCULAR INTERVENTION;  Surgeon: Elam Dutch, MD;  Location: [place] [facility];  Service: Cardiovascular;  Laterality: Right;  ? TONSILLECTOMY    ? ? ?Social History  ? ?Socioeconomic History  ? Marital status: Married  ?  Spouse name: Not on file  ? Number of children: 2  ? Years of education: Not on file  ? Highest education level: Not on file  ?Occupational History  ? Occupation: retired  ?Tobacco Use  ? Smoking status: Some Days  ?  Packs/day: 0.50  ?  Years: 50.00  ?  Pack years: 25.00  ?  Types: Cigarettes  ?  Start date: 06/02/1971  ?  Last attempt to quit: 12/09/2017  ?  Years since quitting: 4.0  ? Smokeless tobacco: Never  ? Tobacco comments:  ?  pt vapes  ?Vaping Use  ? Vaping Use: Some days  ? Substances: Nicotine  ?Substance and Sexual Activity  ? Alcohol use: Yes  ?  Alcohol/week: 0.0 standard drinks  ?  Comment: occasionaly  ? Drug use: No  ? Sexual activity: Not on file  ?Other Topics Concern  ? Not on file  ?Social History Narrative  ? He is originally from [place]. He moved to [facility] in 1965. Previously has traveled to most of the [place]. Has traveled to [place]. Has been to [place], [place], & [place]. 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 Remote bird exposure in 1980. No mold exposure.   ? ?Social Determinants of Health  ? ?Financial Resource Strain: Not on file  ?Food Insecurity: Not on file  ?Transportation Needs: Not on file  ?Physical Activity: Not on file  ?Stress: Not on file  ?Social Connections: Not on file  ?Intimate Partner Violence: Not on file  ? ? ?Family History  ?Problem Relation Age of Onset  ? Heart disease Father   ? Melanoma Father   ? Testicular cancer Father    ? Hypertension Mother   ? Arthritis Mother   ? Arthritis Sister   ? Lung disease Neg Hx   ? ? ?Current Outpatient Medications  ?Medication Sig Dispense Refill  ? amLODipine (NORVASC) 10 MG tablet Take 10 mg by mouth at bedtime.     ? apixaban (ELIQUIS) 5 MG TABS tablet Take 1 tablet (5 mg total) by mouth 2 (two) times daily. Pt is DUE for follow-up, Please call office [phone] to schedule follow-up appt for FUTURE refills. 60 tablet 2  ? Carboxymethylcellul-Glycerin (REFRESH OPTIVE OP) Place 1 drop into both eyes every morning.     ? chlorthalidone (HYGROTON) 25 MG tablet TAKE 1 TABLET BY MOUTH EVERY DAY IN THE MORNING WITH FOOD    ? diazepam (VALIUM)  2 MG tablet Take 2 mg by mouth 2 (two) times daily as needed.    ? fenofibrate (TRICOR) 145 MG tablet Take 145 mg by mouth daily.    ? gabapentin (NEURONTIN) 300 MG capsule 1 tab q hsx5 days,1 tab bidx5days,1 tab bid&2 tabs q hsx5days,2 tabs q am,1 tab q pm&2 tabs q hsx5days, then 2 tabs 3 times a day    ? HYDROcodone-acetaminophen (NORCO/VICODIN) 5-325 MG tablet Take 1-2 tablets by mouth every 6 (six) hours as needed (for pain). 8 tablet 0  ? metFORMIN (GLUCOPHAGE) 500 MG tablet Take 500 mg by mouth 2 (two) times daily with a meal.     ? ondansetron (ZOFRAN) 4 MG tablet Take 4 mg by mouth every 8 (eight) hours as needed for nausea or vomiting.    ? Potassium Chloride ER 20 MEQ TBCR Take 1 tablet by mouth daily.    ? rosuvastatin (CRESTOR) 20 MG tablet Take 1 tablet (20 mg total) by mouth daily. 90 tablet 3  ? sildenafil (VIAGRA) 100 MG tablet Take by mouth.    ? spironolactone (ALDACTONE) 25 MG tablet Take 25 mg by mouth daily.    ? valsartan (DIOVAN) 320 MG tablet Take 320 mg by mouth daily.    ? ?No current facility-administered medications for this visit.  ? ? ?Allergies  ?Allergen Reactions  ? Augmentin [Amoxicillin-Pot Clavulanate] Rash  ? ? ? ?REVIEW OF SYSTEMS:  ? ?'[X]'$  denotes positive finding, '[ ]'$  denotes negative finding ?Cardiac  Comments:  ?Chest pain  or chest pressure:    ?Shortness of breath upon exertion:    ?Short of breath when lying flat:    ?Irregular heart rhythm:    ?    ?Vascular    ?Pain in calf, thigh, or hip brought on by ambulation:    ?Pain in

## 2021-12-18 ENCOUNTER — Ambulatory Visit (INDEPENDENT_AMBULATORY_CARE_PROVIDER_SITE_OTHER): Payer: Medicare Other | Admitting: Physician Assistant

## 2021-12-18 ENCOUNTER — Other Ambulatory Visit: Payer: Self-pay

## 2021-12-18 ENCOUNTER — Encounter: Payer: Self-pay | Admitting: Physician Assistant

## 2021-12-18 VITALS — BP 116/66 | HR 70 | Temp 97.8°F | Resp 18 | Ht 69.0 in | Wt 180.7 lb

## 2021-12-18 DIAGNOSIS — I6523 Occlusion and stenosis of bilateral carotid arteries: Secondary | ICD-10-CM | POA: Diagnosis not present

## 2021-12-18 DIAGNOSIS — Z9889 Other specified postprocedural states: Secondary | ICD-10-CM | POA: Diagnosis not present

## 2021-12-18 DIAGNOSIS — I739 Peripheral vascular disease, unspecified: Secondary | ICD-10-CM | POA: Diagnosis not present

## 2022-01-10 ENCOUNTER — Telehealth: Payer: Self-pay | Admitting: *Deleted

## 2022-01-10 NOTE — Telephone Encounter (Signed)
Left message for patient to call back to schedule follow up lung cancer screening CT scan.  

## 2022-01-11 NOTE — Telephone Encounter (Signed)
Left VM for patient to call to schedule LDCT ?

## 2022-01-11 NOTE — Telephone Encounter (Signed)
Left two VM messages from attempts to contact to schedule LDCT ?

## 2022-01-12 ENCOUNTER — Other Ambulatory Visit: Payer: Self-pay | Admitting: *Deleted

## 2022-01-12 DIAGNOSIS — Z122 Encounter for screening for malignant neoplasm of respiratory organs: Secondary | ICD-10-CM

## 2022-01-12 DIAGNOSIS — F17210 Nicotine dependence, cigarettes, uncomplicated: Secondary | ICD-10-CM

## 2022-01-12 DIAGNOSIS — Z87891 Personal history of nicotine dependence: Secondary | ICD-10-CM

## 2022-01-15 DIAGNOSIS — Z20822 Contact with and (suspected) exposure to covid-19: Secondary | ICD-10-CM | POA: Diagnosis not present

## 2022-01-19 ENCOUNTER — Ambulatory Visit (INDEPENDENT_AMBULATORY_CARE_PROVIDER_SITE_OTHER): Payer: Medicare Other | Admitting: Cardiovascular Disease

## 2022-01-19 ENCOUNTER — Encounter: Payer: Self-pay | Admitting: Cardiovascular Disease

## 2022-01-19 VITALS — BP 118/72 | HR 62 | Ht 70.0 in | Wt 181.4 lb

## 2022-01-19 DIAGNOSIS — I779 Disorder of arteries and arterioles, unspecified: Secondary | ICD-10-CM

## 2022-01-19 DIAGNOSIS — E785 Hyperlipidemia, unspecified: Secondary | ICD-10-CM | POA: Diagnosis not present

## 2022-01-19 DIAGNOSIS — I2510 Atherosclerotic heart disease of native coronary artery without angina pectoris: Secondary | ICD-10-CM | POA: Diagnosis not present

## 2022-01-19 NOTE — Patient Instructions (Signed)
Medication Instructions:  Your physician recommends that you continue on your current medications as directed. Please refer to the Current Medication list given to you today.  *If you need a refill on your cardiac medications before your next appointment, please call your pharmacy*   Follow-Up: At [facility], you and your health needs are our priority.  As part of our continuing mission to provide you with exceptional heart care, we have created designated Provider Care Teams.  These Care Teams include your primary Cardiologist (physician) and Advanced Practice Providers (APPs -  Physician Assistants and Nurse Practitioners) who all work together to provide you with the care you need, when you need it.  We recommend signing up for the patient portal called "MyChart".  Sign up information is provided on this After Visit Summary.  MyChart is used to connect with patients for Virtual Visits (Telemedicine).  Patients are able to view lab/test results, encounter notes, upcoming appointments, etc.  Non-urgent messages can be sent to your provider as well.   To learn more about what you can do with MyChart, go to [email or web address].    Your next appointment:   1 year(s)  The format for your next appointment:   In Person  Provider:   Philip [name], MD     Important Information About Sugar       

## 2022-01-19 NOTE — Progress Notes (Signed)
? ?Cardiology Office Note ? ? ?Date:  01/19/2022  ? ?ID:  Scott [name], DOB Mar 01, 1951, MRN [id number] ? ?PCP:  Shirline Frees, MD  ?Cardiologist:   Mertie Moores, MD  ? ?Chief Complaint  ?Patient presents with  ? Atrial Fibrillation  ? Coronary Artery Disease  ? ?Problem List ?1. CAD - s/p Inf. MI ?2. Anxiety ?3. Hyperlipidemia ?4. Essential hypertension ?5. Atrial fibrillation-  S/p Afib ablation in [facility] 2021 ?6. PAD - stenting of R SFA , [place]  ?7. Right CEA - [place]  ? ?  ? ?Scott [name] is a 71 y.o. male who presents for pre-op clearance prior to a colonoscopy.  He is on plavix and the GI team wanted permission to stop.  ?Feels fine.  ?No CP. ?Not breathing was well.  ?Since last fall, his breathing has not been as good.  Has had lots of bronchial type issues.   Has taked multiple rounds of antibiotics.  ? ?Has not been exercising . ? ?Retired last year.   ? ?Still smoking .   Has been trying to cut back ? ?Denies any palpitations.   ? ?Sept. 15, 2017: ?Scott [name] is here to follow up for  atrial fib ? ?Is on the Eliquis regularly  ?Has been diagnosed with borderline DM  ?Has been seen by Dr. Ashok Cordia for shortness of breath .  ?Has had progressive shortness of breath . ?Has knee problems and shoulder issues ?Has been walking regularly  - walks 2 miles a day  ? ?Has not had any CP  ?No pains similar to his CP prior to stenting   ? ?Oct. 30, 2017: ? ?Scott [name] is seen for follow up of his atrial fib .  ?Still short of breath  ?The cardioversion may have helped the shortness of breath but is not as good as he would like to be  ? ?Dec. 11 , 2017: ? ?Scott [name] seems to be slightly  doing better. He had a cardiac catheterization on November 3. He had a widely patent left anterior descending artery. The right coronary artery stent was widely patent. The left circumflex artery was totally occluded and had right to left collaterals. His left ventricular systolic function was normal with an EF of 55-60%. He had normal left  ventricular end-diastolic pressure. ? ?Staying busy with yard work  - getting up leaves .  ? ?Still smoking  ? ?Wants to consider cardioversion ?Flecainide is not an option due to CAD ?Amio is not a good choice due to possible ? lung disease.  ? ?Aug. 22, 2018: ? ?Scott [name] is seen today for follow-up visit. He had peripheral vascular stenting  in May with Dr. Oneida Alar. ?Is still smoking  ?Has had cardioversion for his atrial fib - only last a short time  ?Has been seen in the Afib clinic - discussed Tikosyn. ?Did not want to do that.  ?Has good days and bad days . ? ?January 17, 2021: ?Scott [name] is seen today after a 4 year absence ?He has seen [name], PA via telemedicine last year ?Has hearing aids.  ?Has leg weakness.  Was found to have a a pinched nerve causing his leg weakness  ?Received steroid shots,  Had back surgery   ?Did not help as much as he would have hoped .  ?Had steroid injections  ? ?Had a cath - looked ok ?Had right carotid stenosis - had right CEA.  ?Had an afib ablation in [facility] - has been successful  ? ?  Saw neurosurgery again  ?2nd opinion at [facility] .   ?EMGs show long term nerve damage  ? ?No recent CP ,    ?Has considerable leg and hip weakness with any amount of walking .  ? ? ?January 19, 2022 ?Scott [name] is seen  ?No angina  ?Has lots of leg pain - related to his back issues ?Has residual nerve damage from the surgery . ?Legs do not hurt,  just are very weak  ?Tried physical therapy.  He lasted only 3 weeks but then found it to be too hard  ? ?Had some success at the [facility] program ,  ? ?Cannot quit smoking  ?Has maintained NSR after his Afib ablation  ? ?Has found that he has reactions to the chlorine at the [facility] ?Does OK with a saline pool (at [employer])  ? ?I suggested that he find an indoor pool that he can use throughout the year.  ? ? ?Past Medical History:  ?Diagnosis Date  ? CAD (coronary artery disease)   ? Cerebrovascular disease   ? HLD (hyperlipidemia)   ? HTN (hypertension)   ? IBS  (irritable bowel syndrome)   ? Lumbar stenosis   ? MI, old   ? Persistent atrial fibrillation ([place])   ? PONV (postoperative nausea and vomiting)   ? Pre-diabetes   ? PVD (peripheral vascular disease) ([place])   ? Stroke (cerebrum) ([place])   ? Tobacco abuse   ? ? ?Past Surgical History:  ?Procedure Laterality Date  ? ABDOMINAL AORTOGRAM W/LOWER EXTREMITY N/A 01/25/2017  ? Procedure: Abdominal Aortogram w/Lower Extremity;  Surgeon: Elam Dutch, MD;  Location: [place] [facility];  Service: Cardiovascular;  Laterality: N/A;  ? ANTERIOR CRUCIATE LIGAMENT REPAIR    ? CARDIAC CATHETERIZATION    ? [place]  ? CARDIAC CATHETERIZATION N/A 07/27/2016  ? Procedure: Left Heart Cath and Coronary Angiography;  Surgeon: Sherren Mocha, MD;  Location: [place] [facility];  Service: Cardiovascular;  Laterality: N/A;  ? CARDIOVERSION N/A 06/29/2016  ? Procedure: CARDIOVERSION;  Surgeon: Fay Records, MD;  Location: [place];  Service: Cardiovascular;  Laterality: N/A;  ? CARDIOVERSION N/A 03/05/2018  ? Procedure: CARDIOVERSION;  Surgeon: Larey Dresser, MD;  Location: [facility] ENDOSCOPY;  Service: Cardiovascular;  Laterality: N/A;  ? CHOLECYSTECTOMY    ? ENDARTERECTOMY Right 12/06/2017  ? Procedure: ENDARTERECTOMY CAROTID RIGHT;  Surgeon: Serafina Mitchell, MD;  Location: [place];  Service: Vascular;  Laterality: Right;  ? [place], 2000  ? x 4 times.  ? LOWER EXTREMITY ANGIOGRAPHY N/A 07/03/2019  ? Procedure: LOWER EXTREMITY ANGIOGRAPHY;  Surgeon: Elam Dutch, MD;  Location: [place] [facility];  Service: Cardiovascular;  Laterality: N/A;  ? LUMBAR LAMINECTOMY/DECOMPRESSION MICRODISCECTOMY Bilateral 09/09/2018  ? Procedure: Laminectomy and Foraminotomy bilateral Lumbar three-Lumbar four - Lumbar four-Lumbar five;  Surgeon: Earnie Larsson, MD;  Location: [place];  Service: Neurosurgery;  Laterality: Bilateral;  ? PATCH ANGIOPLASTY Right 12/06/2017  ? Procedure: PATCH ANGIOPLASTY RIGHT CAROTID ARTERY;  Surgeon: Serafina Mitchell,  MD;  Location: [facility] OR;  Service: Vascular;  Laterality: Right;  ? PERIPHERAL VASCULAR INTERVENTION Right 01/25/2017  ? Procedure: Peripheral Vascular Intervention;  Surgeon: Elam Dutch, MD;  Location: [place] [facility];  Service: Cardiovascular;  Laterality: Right;  ? PERIPHERAL VASCULAR INTERVENTION Right 07/03/2019  ? Procedure: PERIPHERAL VASCULAR INTERVENTION;  Surgeon: Elam Dutch, MD;  Location: [place] [facility];  Service: Cardiovascular;  Laterality: Right;  ? TONSILLECTOMY    ? ? ? ?  Current Outpatient Medications  ?Medication Sig Dispense Refill  ? amLODipine (NORVASC) 10 MG tablet Take 10 mg by mouth at bedtime.     ? apixaban (ELIQUIS) 5 MG TABS tablet Take 1 tablet (5 mg total) by mouth 2 (two) times daily. Pt is DUE for follow-up, Please call office [phone] to schedule follow-up appt for FUTURE refills. 60 tablet 2  ? Carboxymethylcellul-Glycerin (REFRESH OPTIVE OP) Place 1 drop into both eyes every morning.     ? chlorthalidone (HYGROTON) 25 MG tablet TAKE 1 TABLET BY MOUTH EVERY DAY IN THE MORNING WITH FOOD    ? diazepam (VALIUM) 2 MG tablet Take 2 mg by mouth 2 (two) times daily as needed.    ? fenofibrate (TRICOR) 48 MG tablet Take 48 mg by mouth daily.    ? gabapentin (NEURONTIN) 300 MG capsule Take 3 capsules by mouth 2 (two) times daily.    ? HYDROcodone-acetaminophen (NORCO/VICODIN) 5-325 MG tablet Take 1-2 tablets by mouth every 6 (six) hours as needed (for pain). 8 tablet 0  ? metFORMIN (GLUCOPHAGE-XR) 500 MG 24 hr tablet Take 1,000 mg by mouth 2 (two) times daily.    ? ondansetron (ZOFRAN) 4 MG tablet Take 4 mg by mouth every 8 (eight) hours as needed for nausea or vomiting.    ? Potassium Chloride ER 20 MEQ TBCR Take 1 tablet by mouth daily.    ? rosuvastatin (CRESTOR) 20 MG tablet Take 1 tablet (20 mg total) by mouth daily. 90 tablet 3  ? sildenafil (VIAGRA) 100 MG tablet Take by mouth.    ? spironolactone (ALDACTONE) 25 MG tablet Take 25 mg by mouth daily.    ? valsartan  (DIOVAN) 320 MG tablet Take 320 mg by mouth daily.    ? ?No current facility-administered medications for this visit.  ? ? ?Allergies:   Augmentin [amoxicillin-pot clavulanate]  ? ? ?Social History:  [employer]

## 2022-01-25 ENCOUNTER — Ambulatory Visit
Admission: RE | Admit: 2022-01-25 | Discharge: 2022-01-25 | Disposition: A | Discharge status: Home / Self Care | Payer: Medicare Other | Source: Ambulatory Visit | Attending: Acute Care | Admitting: Acute Care

## 2022-01-25 DIAGNOSIS — F17210 Nicotine dependence, cigarettes, uncomplicated: Secondary | ICD-10-CM

## 2022-01-25 DIAGNOSIS — Z122 Encounter for screening for malignant neoplasm of respiratory organs: Secondary | ICD-10-CM

## 2022-01-25 DIAGNOSIS — Z87891 Personal history of nicotine dependence: Secondary | ICD-10-CM

## 2022-01-29 ENCOUNTER — Other Ambulatory Visit: Payer: Self-pay | Admitting: Acute Care

## 2022-01-29 DIAGNOSIS — Z87891 Personal history of nicotine dependence: Secondary | ICD-10-CM

## 2022-01-29 DIAGNOSIS — Z122 Encounter for screening for malignant neoplasm of respiratory organs: Secondary | ICD-10-CM

## 2022-01-29 DIAGNOSIS — F17210 Nicotine dependence, cigarettes, uncomplicated: Secondary | ICD-10-CM

## 2022-01-30 DIAGNOSIS — Z20822 Contact with and (suspected) exposure to covid-19: Secondary | ICD-10-CM | POA: Diagnosis not present

## 2022-02-01 DIAGNOSIS — H2513 Age-related nuclear cataract, bilateral: Secondary | ICD-10-CM | POA: Diagnosis not present

## 2022-02-05 ENCOUNTER — Other Ambulatory Visit: Payer: Medicare Other | Admitting: *Deleted

## 2022-02-05 DIAGNOSIS — E785 Hyperlipidemia, unspecified: Secondary | ICD-10-CM

## 2022-02-05 DIAGNOSIS — I2510 Atherosclerotic heart disease of native coronary artery without angina pectoris: Secondary | ICD-10-CM | POA: Diagnosis not present

## 2022-02-05 LAB — LIPID PANEL
Chol/HDL Ratio: 3.7 ratio (ref 0.0–5.0)
Cholesterol, Total: 133 mg/dL (ref 100–199)
HDL: 36 mg/dL — ABNORMAL LOW (ref >39)
LDL Chol Calc (NIH): 68 mg/dL (ref 0–99)
Triglycerides: 172 mg/dL — ABNORMAL HIGH (ref 0–149)
VLDL Cholesterol Cal: 29 mg/dL (ref 5–40)

## 2022-02-05 LAB — ALT: ALT: 15 IU/L (ref 0–44)

## 2022-02-12 DIAGNOSIS — E1169 Type 2 diabetes mellitus with other specified complication: Secondary | ICD-10-CM | POA: Diagnosis not present

## 2022-02-12 DIAGNOSIS — E7800 Pure hypercholesterolemia, unspecified: Secondary | ICD-10-CM | POA: Diagnosis not present

## 2022-02-12 DIAGNOSIS — I10 Essential (primary) hypertension: Secondary | ICD-10-CM | POA: Diagnosis not present

## 2022-02-26 ENCOUNTER — Other Ambulatory Visit: Payer: Medicare Other

## 2022-03-06 DIAGNOSIS — E1169 Type 2 diabetes mellitus with other specified complication: Secondary | ICD-10-CM | POA: Diagnosis not present

## 2022-03-06 DIAGNOSIS — B9689 Other specified bacterial agents as the cause of diseases classified elsewhere: Secondary | ICD-10-CM | POA: Diagnosis not present

## 2022-03-06 DIAGNOSIS — I10 Essential (primary) hypertension: Secondary | ICD-10-CM | POA: Diagnosis not present

## 2022-03-06 DIAGNOSIS — J329 Chronic sinusitis, unspecified: Secondary | ICD-10-CM | POA: Diagnosis not present

## 2022-03-06 DIAGNOSIS — R29898 Other symptoms and signs involving the musculoskeletal system: Secondary | ICD-10-CM | POA: Diagnosis not present

## 2022-03-14 DIAGNOSIS — E1169 Type 2 diabetes mellitus with other specified complication: Secondary | ICD-10-CM | POA: Diagnosis not present

## 2022-03-14 DIAGNOSIS — I10 Essential (primary) hypertension: Secondary | ICD-10-CM | POA: Diagnosis not present

## 2022-03-14 DIAGNOSIS — E7800 Pure hypercholesterolemia, unspecified: Secondary | ICD-10-CM | POA: Diagnosis not present

## 2022-04-12 ENCOUNTER — Telehealth: Payer: Self-pay | Admitting: Neurology

## 2022-04-12 ENCOUNTER — Ambulatory Visit (INDEPENDENT_AMBULATORY_CARE_PROVIDER_SITE_OTHER): Payer: Medicare Other | Admitting: Neurology

## 2022-04-12 ENCOUNTER — Encounter: Payer: Self-pay | Admitting: Neurology

## 2022-04-12 VITALS — BP 158/72 | HR 56 | Ht 70.0 in | Wt 171.0 lb

## 2022-04-12 DIAGNOSIS — I6529 Occlusion and stenosis of unspecified carotid artery: Secondary | ICD-10-CM

## 2022-04-12 DIAGNOSIS — R531 Weakness: Secondary | ICD-10-CM

## 2022-04-12 DIAGNOSIS — R269 Unspecified abnormalities of gait and mobility: Secondary | ICD-10-CM

## 2022-04-12 DIAGNOSIS — I70263 Atherosclerosis of native arteries of extremities with gangrene, bilateral legs: Secondary | ICD-10-CM

## 2022-04-12 DIAGNOSIS — I639 Cerebral infarction, unspecified: Secondary | ICD-10-CM | POA: Insufficient documentation

## 2022-04-12 DIAGNOSIS — I70218 Atherosclerosis of native arteries of extremities with intermittent claudication, other extremity: Secondary | ICD-10-CM | POA: Diagnosis not present

## 2022-04-12 DIAGNOSIS — K5730 Diverticulosis of large intestine without perforation or abscess without bleeding: Secondary | ICD-10-CM | POA: Diagnosis not present

## 2022-04-12 DIAGNOSIS — I779 Disorder of arteries and arterioles, unspecified: Secondary | ICD-10-CM

## 2022-04-12 DIAGNOSIS — Z9049 Acquired absence of other specified parts of digestive tract: Secondary | ICD-10-CM | POA: Diagnosis not present

## 2022-04-12 DIAGNOSIS — N281 Cyst of kidney, acquired: Secondary | ICD-10-CM | POA: Diagnosis not present

## 2022-04-12 DIAGNOSIS — I739 Peripheral vascular disease, unspecified: Secondary | ICD-10-CM | POA: Diagnosis not present

## 2022-04-12 NOTE — Telephone Encounter (Signed)
FYI: Pt stated at check out that he would not like to schedule NCV/EMG at this time. He states he had this test done two years ago and would like to use those results rather than completing the test again.

## 2022-04-12 NOTE — Telephone Encounter (Signed)
medicare/BCBS sup NPR sent to GI  

## 2022-04-12 NOTE — Progress Notes (Addendum)
Chief Complaint  Patient presents with   New Patient (Initial Visit)    Rm EMG/NCV 3. Alone. NP/[facility] @ Darolyn Rua MD/persistent leg weakness.      ASSESSMENT AND PLAN  DARLY FAILS is a 71 y.o. male  Gradual onset bilateral lower extremity weakness,  On examination, he does has mild bilateral hip flexion, ankle dorsiflexion weakness, well-preserved sensory exam, brisk upper and patellar reflex, absent ankle reflex, bilateral Babinski signs  MRI of the thoracic spine to rule out thoracic pathology  Laboratory evaluation including CPK, inflammatory markers,  EMG nerve conduction study,  He has severe vascular disease, lower extremity vascular study was normal, will do MR angiogram of pelvic to rule out more proximal muscle pathology      DIAGNOSTIC DATA (LABS, IMAGING, TESTING) - I reviewed patient records, labs, notes, testing and imaging myself where available.  Laboratory evaluation CMP June 2023: Normal TSH 1.23, A1c 10.7, elevated creatinine 1.57, hemoglobin of 14.7,  MRI Lumbar in Sept 2020 1. Excellent posterior decompression at L3-4 and L4-5 with no residual impingement at either level. Expected postsurgical changes and scarring. 2. Slight enhancement around the L3-4 facet joints consistent with slight inflammation. 3. No other change since the prior study  MRI cervical in April 2019 1. Cervical spondylosis with prominent disc and facet degenerative changes greatest at the C4-5 and C5-6 levels. 2. Moderate right C4-5 and moderate to severe left C5-6 foraminal stenosis. Multilevel mild foraminal stenosis. 3. No significant canal stenosis   MEDICAL HISTORY:  QURON RUDDY is a 71 year old male, seen in request by his primary care physician Dr. Kenton Kingfisher, Gwyndolyn Saxon, for evaluation of bilateral lower extremity weakness, initial evaluation was on April 13, 2022,  I reviewed and summarized the referring note. Hyperlipidemia Hypertension Atrial  fibrillation, on Eliquis Longtime smoker Peripheral vascular disease, status post right superficial femoral artery stenting in May 2018, Right internal carotid artery disease, status post right endarterectomy Coronary artery disease  Around 2019, he noticed intermittent bilateral lower extremity weakness, especially after prolonged walking, he denies sensory loss, denies significant low back pain, no bowel and bladder incontinence, at that time he also complains right visual disturbance, ultrasound of carotid artery on December 04, 2017 showed a critical near occlusion proximal right ICA short segment stenosis, left carotid artery showed less than 50% stenosis,  MRI of the brain showed several punctuated focus of infarction in the right frontal parietal lobe, CT angiogram neck showed severe at least 90% stenosis of proximal right ICA with severe stenosis of right vertebral artery V3 segment, calcification of the cavernous internal carotid artery,  He underwent right internal carotid artery endarterectomy with bovine pericardial patch angioplasty, in March 2019,  He continued complaints of marked fatigue, he underwent cardiac conversion by Dr. Genene Churn in June 2019 without holding sinus rhythm, later attempt by [facility] Cardiologist Dr. Willis Modena was successful,   Later work-up showed L3-4, L4-5 stenosis with neurogenic claudication, underwent bilateral L3-4-5 decompressive laminotomy with foraminotomies by Dr. Deri Fuelling in December 2019  He had few good months until May 2020, then he began to feel the same way again, extreme fatigue, bilateral lower extremity weakness,  Post surgical MRI lumbar in September 2020 showed posterior decompression at L3-4 L4-5 with no residual impingement at either level,  But since then, he continued to decline, becoming less active, complains of lower extremity weakness, lack gave out underneath him  EMG nerve conduction study at [facility] in May 2021 showed mild chronic  neuropathic changes involving  bilateral L5-S1 myotomes  MRI of cervical spine in April 2019 showed mild degenerative changes, no significant canal stenosis variable degree of foraminal narrowing  He denies significant upper extremity weakness, no bulbar weakness, no double vision droopy eyelids swallowing difficulty  PHYSICAL EXAM:   Vitals:   04/12/22 0951 04/12/22 0954  BP: (!) 149/67 (!) 158/72  Pulse: (!) 52 (!) 56  Weight: 171 lb (77.6 kg)   Height: '5\' 10"'$  (1.778 m)      Body mass index is 24.54 kg/m.  PHYSICAL EXAMNIATION:  Gen: NAD, conversant, well nourised, well groomed                     Cardiovascular: Regular rate rhythm, no peripheral edema, warm, nontender. Eyes: Conjunctivae clear without exudates or hemorrhage Neck: Supple, no carotid bruits. Pulmonary: Clear to auscultation bilaterally   NEUROLOGICAL EXAM:  MENTAL STATUS: Speech/cognition: Awake, alert, oriented to history taking and casual conversation CRANIAL NERVES: CN II: Visual fields are full to confrontation. Pupils are round equal and briskly reactive to light. CN III, IV, VI: extraocular movement are normal. No ptosis. CN V: Facial sensation is intact to light touch CN VII: Face is symmetric with normal eye closure  CN VIII: Hearing is normal to causal conversation. CN IX, X: Phonation is normal. CN XI: Head turning and shoulder shrug are intact  MOTOR: Mild left shoulder abduction weakness, limited by his left shoulder pain, otherwise there was no significant upper extremity proximal and distal muscle weakness,  LE Hip Flexion Knee flexion Knee extension Ankle Dorsiflexion Ankle plantar Flexion  R '4 5 5 4 5  '$ L '4 5 5 4 5     '$ REFLEXES: Reflexes are 2+ and symmetric at the biceps, triceps, knees, and absent at ankles. Plantar responses are extensor bilaterally  SENSORY: Intact to light touch, pinprick and vibratory sensation are intact in fingers and toes.  COORDINATION: There is no  trunk or limb dysmetria noted.  GAIT/STANCE: Push-up from the chair, leaning forward, steady  REVIEW OF SYSTEMS:  Full 14 system review of systems performed and notable only for as above All other review of systems were negative.   ALLERGIES: Allergies  Allergen Reactions   Augmentin [Amoxicillin-Pot Clavulanate] Rash    HOME MEDICATIONS: Current Outpatient Medications  Medication Sig Dispense Refill   apixaban (ELIQUIS) 5 MG TABS tablet Take 1 tablet (5 mg total) by mouth 2 (two) times daily. Pt is DUE for follow-up, Please call office [phone] to schedule follow-up appt for FUTURE refills. 60 tablet 2   Carboxymethylcellul-Glycerin (REFRESH OPTIVE OP) Place 1 drop into both eyes every morning.      diazepam (VALIUM) 2 MG tablet Take 2 mg by mouth 2 (two) times daily as needed.     fenofibrate (TRICOR) 48 MG tablet Take 48 mg by mouth daily.     gabapentin (NEURONTIN) 300 MG capsule Take 3 capsules by mouth 2 (two) times daily.     HYDROcodone-acetaminophen (NORCO/VICODIN) 5-325 MG tablet Take 1-2 tablets by mouth every 6 (six) hours as needed (for pain). 8 tablet 0   metFORMIN (GLUCOPHAGE-XR) 500 MG 24 hr tablet Take 1,000 mg by mouth 2 (two) times daily.     ondansetron (ZOFRAN) 4 MG tablet Take 4 mg by mouth every 8 (eight) hours as needed for nausea or vomiting.     Potassium Chloride ER 20 MEQ TBCR Take 1 tablet by mouth daily.     rosuvastatin (CRESTOR) 20 MG tablet Take 1 tablet (20  mg total) by mouth daily. 90 tablet 3   sildenafil (VIAGRA) 100 MG tablet Take by mouth.     spironolactone (ALDACTONE) 25 MG tablet Take 25 mg by mouth daily.     valsartan (DIOVAN) 320 MG tablet Take 320 mg by mouth daily.     No current facility-administered medications for this visit.    PAST MEDICAL HISTORY: Past Medical History:  Diagnosis Date   CAD (coronary artery disease)    Cerebrovascular disease    HLD (hyperlipidemia)    HTN (hypertension)    IBS (irritable bowel  syndrome)    Lumbar stenosis    MI, old    Persistent atrial fibrillation (HCC)    PONV (postoperative nausea and vomiting)    Pre-diabetes    PVD (peripheral vascular disease) (HCC)    Stroke (cerebrum) (HCC)    Tobacco abuse     PAST SURGICAL HISTORY: Past Surgical History:  Procedure Laterality Date   ABDOMINAL AORTOGRAM W/LOWER EXTREMITY N/A 01/25/2017   Procedure: Abdominal Aortogram w/Lower Extremity;  Surgeon: Elam Dutch, MD;  Location: [place] [facility];  Service: Cardiovascular;  Laterality: N/A;   ANTERIOR CRUCIATE LIGAMENT REPAIR     CARDIAC CATHETERIZATION     [facility]E   CARDIAC CATHETERIZATION N/A 07/27/2016   Procedure: Left Heart Cath and Coronary Angiography;  Surgeon: Sherren Mocha, MD;  Location: [place] [facility];  Service: Cardiovascular;  Laterality: N/A;   CARDIOVERSION N/A 06/29/2016   Procedure: CARDIOVERSION;  Surgeon: Fay Records, MD;  Location[facility];  Service: Cardiovascular;  Laterality: N/A;   CARDIOVERSION N/A 03/05/2018   Procedure: CARDIOVERSION;  Surgeon: Larey Dresser, MD;  Location: [place];  Service: Cardiovascular;  Laterality: N/A;   CHOLECYSTECTOMY     ENDARTERECTOMY Right 12/06/2017   Procedure: ENDARTERECTOMY CAROTID RIGHT;  Surgeon: Serafina Mitchell, MD;  Location: [place];  Service: Vascular;  Laterality: Right;   [place], 2000   x 4 times.   LOWER EXTREMITY ANGIOGRAPHY N/A 07/03/2019   Procedure: LOWER EXTREMITY ANGIOGRAPHY;  Surgeon: Elam Dutch, MD;  Location: [place] [facility];  Service: Cardiovascular;  Laterality: N/A;   LUMBAR LAMINECTOMY/DECOMPRESSION MICRODISCECTOMY Bilateral 09/09/2018   Procedure: Laminectomy and Foraminotomy bilateral Lumbar three-Lumbar four - Lumbar four-Lumbar five;  Surgeon: Earnie Larsson, MD;  Location: [place];  Service: Neurosurgery;  Laterality: Bilateral;   PATCH ANGIOPLASTY Right 12/06/2017   Procedure: PATCH ANGIOPLASTY RIGHT CAROTID ARTERY;  Surgeon: Serafina Mitchell, MD;  Location: [facility];  Service: Vascular;  Laterality: Right;   PERIPHERAL VASCULAR INTERVENTION Right 01/25/2017   Procedure: Peripheral Vascular Intervention;  Surgeon: Elam Dutch, MD;  Location: [place] [facility];  Service: Cardiovascular;  Laterality: Right;   PERIPHERAL VASCULAR INTERVENTION Right 07/03/2019   Procedure: PERIPHERAL VASCULAR INTERVENTION;  Surgeon: Elam Dutch, MD;  Location: [place] [facility];  Service: Cardiovascular;  Laterality: Right;   TONSILLECTOMY      FAMILY HISTORY: Family History  Problem Relation Age of Onset   Heart disease Father    Melanoma Father    Testicular cancer Father    Hypertension Mother    Arthritis Mother    Arthritis Sister    Lung disease Neg Hx     SOCIAL HISTORY: Social History   Socioeconomic History   Marital status: Married    Spouse name: Not on file   Number of children: 2   Years of education: Not on file   Highest education level:  Not on file  Occupational History   Occupation: retired  Tobacco Use   Smoking status: Some Days    Packs/day: 0.50    Years: 50.00    Total pack years: 25.00    Types: Cigarettes    Start date: 06/02/1971    Last attempt to quit: 12/09/2017    Years since quitting: 4.3   Smokeless tobacco: Never   Tobacco comments:    pt vapes  Vaping Use   Vaping Use: Some days   Substances: Nicotine  Substance and Sexual Activity   Alcohol use: Yes    Alcohol/week: 0.0 standard drinks of alcohol    Comment: occasionaly   Drug use: No   Sexual activity: Not on file  Other Topics Concern   Not on file  Social History Narrative   He is originally from [place]. He moved to [facility] in 1965. Previously has traveled to most of the [place]. Has traveled to [place]. Has been to [place], [place], & [place]. 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 Remote bird exposure in 1980. No mold exposure.    Social Determinants of Health   Financial Resource  Strain: Not on file  Food Insecurity: Not on file  Transportation Needs: Not on file  Physical Activity: Not on file  Stress: Not on file  Social Connections: Not on file  Intimate Partner Violence: Not on file   Total time spent reviewing the chart, obtaining history, examined patient, ordering tests, documentation, consultations and family, care coordination was 79.  Marcial Pacas, M.D. Ph.D.  [facility] [address], [place], [place] [address] Ph: [phone] Fax: [phone]  CC:  Shirline Frees, MD [address] [place],  [place] [address]  Shirline Frees, MD

## 2022-04-15 LAB — RPR: RPR Ser Ql: NONREACTIVE

## 2022-04-15 LAB — C-REACTIVE PROTEIN: CRP: 2 mg/L (ref 0–10)

## 2022-04-15 LAB — SEDIMENTATION RATE: Sed Rate: 5 mm/hr (ref 0–30)

## 2022-04-15 LAB — ANA W/REFLEX IF POSITIVE: Anti Nuclear Antibody (ANA): NEGATIVE

## 2022-04-15 LAB — VITAMIN B12: Vitamin B-12: 300 pg/mL (ref 232–1245)

## 2022-04-15 LAB — CK: Total CK: 83 U/L (ref 41–331)

## 2022-04-16 NOTE — Telephone Encounter (Signed)
[facility] does not do MRA pelvis, so I sent the order to [facility].

## 2022-04-18 DIAGNOSIS — E7800 Pure hypercholesterolemia, unspecified: Secondary | ICD-10-CM | POA: Diagnosis not present

## 2022-04-18 DIAGNOSIS — I10 Essential (primary) hypertension: Secondary | ICD-10-CM | POA: Diagnosis not present

## 2022-04-18 DIAGNOSIS — E1169 Type 2 diabetes mellitus with other specified complication: Secondary | ICD-10-CM | POA: Diagnosis not present

## 2022-04-18 NOTE — Telephone Encounter (Signed)
Patient left me a voice mail wondering about schedule the MRA pelvis, looks like he has not read the message I sent him on 7/24. I returned his call and lvm letting him know I sent the order to [facility] and gave him the centralized scheduling number to call. [phone]

## 2022-04-19 NOTE — Addendum Note (Signed)
Addended by: Marcial Pacas on: 04/19/2022 11:24 AM   Modules accepted: Orders

## 2022-04-19 NOTE — Telephone Encounter (Signed)
[facility] scheduler called and wanted to know if you would like to add MRI pelvis w/wo contrast to see muscle/tendons? She said the MRA pelvis that is ordered will only show arteries. She said they can do both at the same time if we add the order.

## 2022-04-19 NOTE — Telephone Encounter (Signed)
Orders Placed This Encounter  Procedures   MR PELVIS W WO CONTRAST     I added on above order

## 2022-04-19 NOTE — Telephone Encounter (Signed)
I spoke to Scott [name] in scheduling and she is going to add this MRI to his appointment.

## 2022-04-22 ENCOUNTER — Other Ambulatory Visit: Payer: Medicare Other

## 2022-04-22 ENCOUNTER — Ambulatory Visit (HOSPITAL_COMMUNITY)
Admission: RE | Admit: 2022-04-22 | Discharge: 2022-04-22 | Disposition: A | Discharge status: Home / Self Care | Payer: Medicare Other | Source: Ambulatory Visit | Attending: Neurology | Admitting: Neurology

## 2022-04-22 ENCOUNTER — Ambulatory Visit
Admission: RE | Admit: 2022-04-22 | Discharge: 2022-04-22 | Disposition: A | Discharge status: Home / Self Care | Payer: Medicare Other | Source: Ambulatory Visit | Attending: Neurology | Admitting: Neurology

## 2022-04-22 DIAGNOSIS — I70263 Atherosclerosis of native arteries of extremities with gangrene, bilateral legs: Secondary | ICD-10-CM | POA: Insufficient documentation

## 2022-04-22 DIAGNOSIS — I70218 Atherosclerosis of native arteries of extremities with intermittent claudication, other extremity: Secondary | ICD-10-CM | POA: Insufficient documentation

## 2022-04-22 DIAGNOSIS — R269 Unspecified abnormalities of gait and mobility: Secondary | ICD-10-CM

## 2022-04-22 DIAGNOSIS — I6529 Occlusion and stenosis of unspecified carotid artery: Secondary | ICD-10-CM | POA: Insufficient documentation

## 2022-04-22 DIAGNOSIS — I639 Cerebral infarction, unspecified: Secondary | ICD-10-CM | POA: Diagnosis not present

## 2022-04-22 DIAGNOSIS — M5136 Other intervertebral disc degeneration, lumbar region: Secondary | ICD-10-CM | POA: Diagnosis not present

## 2022-04-22 DIAGNOSIS — M6281 Muscle weakness (generalized): Secondary | ICD-10-CM | POA: Diagnosis not present

## 2022-04-22 DIAGNOSIS — K4090 Unilateral inguinal hernia, without obstruction or gangrene, not specified as recurrent: Secondary | ICD-10-CM | POA: Diagnosis not present

## 2022-04-22 DIAGNOSIS — R531 Weakness: Secondary | ICD-10-CM

## 2022-04-22 DIAGNOSIS — K5730 Diverticulosis of large intestine without perforation or abscess without bleeding: Secondary | ICD-10-CM | POA: Diagnosis not present

## 2022-04-22 MED ORDER — GADOBUTROL 1 MMOL/ML IV SOLN
8.0000 mL | Freq: Once | INTRAVENOUS | Status: AC | PRN
  Administered 2022-04-22: 8 mL via INTRAVENOUS

## 2022-04-23 ENCOUNTER — Telehealth: Payer: Self-pay | Admitting: *Deleted

## 2022-04-23 DIAGNOSIS — I10 Essential (primary) hypertension: Secondary | ICD-10-CM | POA: Diagnosis not present

## 2022-04-23 NOTE — Telephone Encounter (Signed)
I called patient. I had an extended conversation with him. I discuss his thoracic spine MRI results and recommendations. Patient already has a spine specialist involved at [facility]. We will call him with the MRI pelvis results. Pt verbalized understanding of results. Pt had no questions at this time but was encouraged to call back if questions arise.

## 2022-04-23 NOTE — Telephone Encounter (Signed)
-----   Message from Star Age, MD sent at 04/23/2022 12:22 PM EDT ----- Thoracic spine MRI showed no acute abnormalities or significant degenerative changes but there was evidence of degenerative changes on the lower side of the neck. His previous MRI of the neck in 2019 also indicated degenerative changes in the lower neck area.  He may benefit from seeing a spine specialist, he can discuss further with Dr. Krista Blue as far as a referral is concerned.

## 2022-04-24 ENCOUNTER — Telehealth: Payer: Self-pay | Admitting: *Deleted

## 2022-04-24 NOTE — Telephone Encounter (Signed)
Message from Dr. Jaynee Eagles:  Please call patient, MRI pelvis did not show any muscle edema or atrophy or anything suspicious in the muscles. 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 If he does not have an orthopaedist we can refer to one for a better look, thanks

## 2022-04-24 NOTE — Telephone Encounter (Signed)
I spoke to the patient and provided the MRI pelvis findings. He expressed understanding. He is already established with [facility] and will call them for an appt.

## 2022-04-24 NOTE — Telephone Encounter (Signed)
-----   Message from Anda Latina, RN sent at 04/24/2022  2:45 PM EDT -----  ----- Message ----- From: Melvenia Beam, MD Sent: 04/24/2022  12:16 PM EDT To: Carilyn Goodpasture 4 Results  Michele: Please call patient, MRI pelvis did not show any muscle edema or atrophy or anything suspicious in the muscles. 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 If he does not have an orthopaedist we can refer to one for a better look, thanks

## 2022-05-07 ENCOUNTER — Telehealth: Payer: Self-pay

## 2022-05-07 DIAGNOSIS — I639 Cerebral infarction, unspecified: Secondary | ICD-10-CM

## 2022-05-07 DIAGNOSIS — R269 Unspecified abnormalities of gait and mobility: Secondary | ICD-10-CM

## 2022-05-07 NOTE — Telephone Encounter (Signed)
I received a call from Onycha at [facility] (Phone #: [phone]). The radiographer would like to replace the order of MR Angio of pelvis without contrast to MR Angio of pelvis with and without contrast. He said the additional imaging would provide more accuracy. Okay to provide verbal for order change?

## 2022-05-07 NOTE — Telephone Encounter (Signed)
It is Ok to do MR angio of pelvis w/wo, but his creat was elevated few months ago, please makes sure he has repeat creat prior to MR  Orders Placed This Encounter  Procedures   Creatinine with Est GFR  May ask patient come to our laboratory for blood test prior to MR angiogram

## 2022-05-08 NOTE — Telephone Encounter (Signed)
I left the patient and VM for a return call.

## 2022-05-08 NOTE — Telephone Encounter (Signed)
I spoke to the patient. He is agreeable to come in for repeat labs and then move forward with the ordered test. He is out-of-town through Sunday. Provided him with our office hours and he will come in next week.

## 2022-05-10 ENCOUNTER — Telehealth: Payer: Self-pay | Admitting: Neurology

## 2022-05-10 DIAGNOSIS — I639 Cerebral infarction, unspecified: Secondary | ICD-10-CM

## 2022-05-10 DIAGNOSIS — R269 Unspecified abnormalities of gait and mobility: Secondary | ICD-10-CM

## 2022-05-10 DIAGNOSIS — R531 Weakness: Secondary | ICD-10-CM

## 2022-05-10 DIAGNOSIS — R935 Abnormal findings on diagnostic imaging of other abdominal regions, including retroperitoneum: Secondary | ICD-10-CM

## 2022-05-10 NOTE — Telephone Encounter (Signed)
I returned the call. Informed Ubaldo Glassing is out to lunch and to call back in 45 minutes.

## 2022-05-10 NOTE — Telephone Encounter (Signed)
Scott [name] @ [facility] MRI dept is asking for a call from RN, she can be reached at [phone]

## 2022-05-10 NOTE — Telephone Encounter (Signed)
I returned the call. Per Ubaldo Glassing, the radiologist is asking Dr. Krista Blue to change the MRA pelvis to with and without contrast for a better exam.   Message will be sent to MD for approval. If order is changed, we will need to forward to our MRI coordinator for new authorization.   The test has not been scheduled yet.

## 2022-05-14 NOTE — Addendum Note (Signed)
Addended by: Verlin Grills on: 05/14/2022 10:43 AM   Modules accepted: Orders

## 2022-05-14 NOTE — Telephone Encounter (Addendum)
I received a call from [place] on this message and we discussed message. She sts he reviewed message and found the message from Dr. Krista Blue ok to place MRA w/wo . I have placed order.  Horris Latino also relayed the contrast used now does not affect the kidney function and would be safe to used with the most recent labs. Will update Dr. Krista Blue on this.

## 2022-05-20 ENCOUNTER — Ambulatory Visit (HOSPITAL_COMMUNITY)
Admission: RE | Admit: 2022-05-20 | Discharge: 2022-05-20 | Disposition: A | Discharge status: Home / Self Care | Payer: Medicare Other | Source: Ambulatory Visit | Attending: Neurology | Admitting: Neurology

## 2022-05-20 DIAGNOSIS — R935 Abnormal findings on diagnostic imaging of other abdominal regions, including retroperitoneum: Secondary | ICD-10-CM | POA: Diagnosis not present

## 2022-05-20 DIAGNOSIS — I739 Peripheral vascular disease, unspecified: Secondary | ICD-10-CM | POA: Insufficient documentation

## 2022-05-20 DIAGNOSIS — I701 Atherosclerosis of renal artery: Secondary | ICD-10-CM

## 2022-05-20 DIAGNOSIS — I999 Unspecified disorder of circulatory system: Secondary | ICD-10-CM | POA: Insufficient documentation

## 2022-05-20 DIAGNOSIS — I639 Cerebral infarction, unspecified: Secondary | ICD-10-CM | POA: Insufficient documentation

## 2022-05-20 DIAGNOSIS — R269 Unspecified abnormalities of gait and mobility: Secondary | ICD-10-CM | POA: Diagnosis not present

## 2022-05-20 DIAGNOSIS — R531 Weakness: Secondary | ICD-10-CM | POA: Diagnosis not present

## 2022-05-20 MED ORDER — GADOBUTROL 1 MMOL/ML IV SOLN
8.0000 mL | Freq: Once | INTRAVENOUS | Status: AC | PRN
  Administered 2022-05-20: 8 mL via INTRAVENOUS

## 2022-05-22 ENCOUNTER — Telehealth: Payer: Self-pay | Admitting: Neurology

## 2022-05-22 NOTE — Telephone Encounter (Signed)
Patient had MRA this past  Sunday 8/27. The patent wanted to discuss the results with Dr. Krista Blue directly or make an appointment to see if they can star treating what they found if anything.  Pt had a Nerve Conduction back in 2021 at [facility] so the test scheduled for October by her is not something he really wants to do.  He wanted to know why she ordered the last MRA was was she looking for?   He did speak to the nurse previously about first MRA results but wants to talk to her about the tears. The Doctor that reviewed the test in her absence stated there were tears in the hip area and he was told to go see an Ortho. So before he makes an appointment he wants to clarify a few things with Dr. Krista Blue.  Pt did not do the blood work. Can yu schedule a video if possible.

## 2022-05-22 NOTE — Telephone Encounter (Signed)
I am waiting for MRA pelvic result, once it is available, please schedule a virtual visit with me. It is ok to add on

## 2022-05-23 ENCOUNTER — Other Ambulatory Visit: Payer: Self-pay | Admitting: Neurology

## 2022-05-23 ENCOUNTER — Other Ambulatory Visit (HOSPITAL_COMMUNITY): Payer: Self-pay | Admitting: Neurology

## 2022-05-23 DIAGNOSIS — I739 Peripheral vascular disease, unspecified: Secondary | ICD-10-CM

## 2022-05-23 DIAGNOSIS — I639 Cerebral infarction, unspecified: Secondary | ICD-10-CM

## 2022-05-23 DIAGNOSIS — I999 Unspecified disorder of circulatory system: Secondary | ICD-10-CM

## 2022-05-23 DIAGNOSIS — R531 Weakness: Secondary | ICD-10-CM

## 2022-05-23 DIAGNOSIS — R269 Unspecified abnormalities of gait and mobility: Secondary | ICD-10-CM

## 2022-05-23 DIAGNOSIS — R935 Abnormal findings on diagnostic imaging of other abdominal regions, including retroperitoneum: Secondary | ICD-10-CM

## 2022-05-23 DIAGNOSIS — I701 Atherosclerosis of renal artery: Secondary | ICD-10-CM

## 2022-05-23 MED ORDER — GADOBUTROL 1 MMOL/ML IV SOLN
10.0000 mL | Freq: Once | INTRAVENOUS | Status: AC | PRN
  Administered 2022-05-23: 10 mL via INTRAVENOUS

## 2022-05-24 ENCOUNTER — Telehealth: Payer: Self-pay | Admitting: Neurology

## 2022-05-24 NOTE — Telephone Encounter (Signed)
I called pt. No answer, left a message asking pt to call me back.   

## 2022-05-24 NOTE — Telephone Encounter (Signed)
Please call patient, angiogram of pelvic and abdomen reported as suboptimal evaluation,  No acute abnormality noted, no evidence of significant vascular abnormality, or MRA evidence of hemodynamics significant stenosis within the abdomen, pelvic,  Sigmoid diverticulosis, dystrophic prostate appearance

## 2022-05-29 NOTE — Telephone Encounter (Signed)
Pt returned my call and we discussed results. He verbalized understanding and appreciation for the call.  He did request to move his f/u appt up to 06/06/22 to discuss results and plan going forward he does not want to pursue the emg/ncs at this time.   I have scheduled the f/u but left the EMG/NCS appt as is until he is able to speak with Dr. Krista Blue,

## 2022-06-06 ENCOUNTER — Ambulatory Visit (INDEPENDENT_AMBULATORY_CARE_PROVIDER_SITE_OTHER): Payer: Medicare Other | Admitting: Neurology

## 2022-06-06 ENCOUNTER — Encounter: Payer: Self-pay | Admitting: Neurology

## 2022-06-06 VITALS — BP 145/76 | HR 61 | Ht 70.0 in | Wt 169.0 lb

## 2022-06-06 DIAGNOSIS — R531 Weakness: Secondary | ICD-10-CM | POA: Diagnosis not present

## 2022-06-06 DIAGNOSIS — I779 Disorder of arteries and arterioles, unspecified: Secondary | ICD-10-CM

## 2022-06-06 DIAGNOSIS — R269 Unspecified abnormalities of gait and mobility: Secondary | ICD-10-CM

## 2022-06-06 NOTE — Progress Notes (Signed)
Chief Complaint  Patient presents with   Follow-up    Emg room 4      ASSESSMENT AND PLAN  MAKENZIE VITTORIO is a 71 y.o. male  Gradual onset intermittent bilateral lower extremity weakness, Peripheral vascular disease, status post right superficial femoral artery stenting, right internal carotid artery status post right carotid artery endarterectomy Chronic low back pain, on Norco 5/325 mg every 6 hours as needed  Extensive evaluation showed no significant abnormalities,  Intermittent difficulty, when he function well, he can walk around 3 miles without significant difficulty, mild variation on physical examination, no significant sensory loss o weakness confirmedr   Suggesting continue follow-up with primary care physician, no neurological condition to explain his intermittent symptoms,    DIAGNOSTIC DATA (LABS, IMAGING, TESTING) - I reviewed patient records, labs, notes, testing and imaging myself where available.  Laboratory evaluation CMP June 2023: Normal TSH 1.23, A1c 10.7, elevated creatinine 1.57, hemoglobin of 14.7,  MRI Lumbar in Sept 2020 1. Excellent posterior decompression at L3-4 and L4-5 with no residual impingement at either level. Expected postsurgical changes and scarring. 2. Slight enhancement around the L3-4 facet joints consistent with slight inflammation. 3. No other change since the prior study  MRI cervical in April 2019 1. Cervical spondylosis with prominent disc and facet degenerative changes greatest at the C4-5 and C5-6 levels. 2. Moderate right C4-5 and moderate to severe left C5-6 foraminal stenosis. Multilevel mild foraminal stenosis. 3. No significant canal stenosis   MEDICAL HISTORY:  WEYLYN RICCIUTI is a 71 year old male, seen in request by his primary care physician Dr. Kenton Kingfisher, Gwyndolyn Saxon, for evaluation of bilateral lower extremity weakness, initial evaluation was on April 13, 2022,  I reviewed and summarized the referring  note. Hyperlipidemia Hypertension Atrial fibrillation, on Eliquis Longtime smoker Peripheral vascular disease, status post right superficial femoral artery stenting in May 2018, Right internal carotid artery disease, status post right endarterectomy Coronary artery disease  Around 2019, he noticed intermittent bilateral lower extremity weakness, especially after prolonged walking, he denies sensory loss, denies significant low back pain, no bowel and bladder incontinence, at that time he also complains right visual disturbance, ultrasound of carotid artery on December 04, 2017 showed a critical near occlusion proximal right ICA short segment stenosis, left carotid artery showed less than 50% stenosis,  MRI of the brain showed several punctuated focus of infarction in the right frontal parietal lobe, CT angiogram neck showed severe at least 90% stenosis of proximal right ICA with severe stenosis of right vertebral artery V3 segment, calcification of the cavernous internal carotid artery,  He underwent right internal carotid artery endarterectomy with bovine pericardial patch angioplasty, in March 2019,  He continued complaints of marked fatigue, he underwent cardiac conversion by Dr. Genene Churn in June 2019 of A fib without holding sinus rhythm, later attempt by [facility] Cardiologist Dr. Willis Modena was successful,   Later work-up showed L3-4, L4-5 stenosis with neurogenic claudication, underwent bilateral L3-4-5 decompressive laminotomy with foraminotomies by Dr. 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  MRI of cervical spine in April 2019 showed mild degenerative changes, no significant canal stenosis variable degree of foraminal narrowing  He denies significant upper extremity weakness, no bulbar weakness, no double vision droopy eyelids swallowing difficulty  UPDATE Sept 13 2023: He drove to clinic, alone at visit today, he was able to do few hours of yard work without giving out, do complaints of low back pain, lower extremity shaky afterwards,  He was also able to walk around 3 miles without any difficulty, exercise in the pool regularly, taking Norco 5/325 regularly  He had extensive evaluation since last visit, personally reviewed MRI of thoracic spine, mild degenerative changes, no canal foraminal narrowing  MRI pelvic with without contrast showed no acute or chronic muscular abnormality  MR angiogram of pelvic and abdominal showed no acute vascular abnormality, or evidence of hemodynamic significant stenosis within the abdomen or pelvis, sigmoid diverticulosis and dystrophic prostate appearance,  Extensive laboratory evaluation showed normal negative ANA, ESR, C-reactive protein, RPR B12, CPK, PHYSICAL EXAM:   Vitals:   06/06/22 1411  BP: (!) 145/76  Pulse: 61  Weight: 169 lb (76.7 kg)  Height: 5' 10"  (1.778 m)     Body mass index is 24.25 kg/m.  PHYSICAL EXAMNIATION:  Gen: NAD, conversant, well nourised, well groomed                     Cardiovascular: Regular rate rhythm, no peripheral edema, warm, nontender. Eyes: Conjunctivae clear without exudates or hemorrhage Neck: Supple, no carotid bruits. Pulmonary: Clear to auscultation bilaterally   NEUROLOGICAL EXAM:  MENTAL STATUS: Speech/cognition: Awake, alert, oriented to history taking and casual conversation CRANIAL NERVES: CN II: Visual fields are full to confrontation. Pupils are round equal and  briskly reactive to light. CN III, IV, VI: extraocular movement are normal. No ptosis. CN V: Facial sensation is intact to light touch CN VII: Face is symmetric with normal eye closure  CN VIII: Hearing is normal to causal conversation. CN IX, X: Phonation is normal. CN XI: Head turning and shoulder shrug are intact  MOTOR: Variable effort in motor examination, felt there was no significant bilateral upper or lower extremity proximal and distal weakness  REFLEXES: Reflexes are 2+ and symmetric at the biceps, triceps, knees, and absent at ankles. Plantar responses are extensor bilaterally  SENSORY: Intact to light touch, pinprick and vibratory sensation are intact in fingers and toes.  COORDINATION: There is no trunk or limb dysmetria noted.  GAIT/STANCE: Push-up from the chair, leaning forward, steady  REVIEW OF SYSTEMS:  Full 14 system review of systems performed and notable only for as above All other review of systems were negative.   ALLERGIES: Allergies  Allergen Reactions   Augmentin [Amoxicillin-Pot Clavulanate] Rash    HOME MEDICATIONS: Current Outpatient Medications  Medication Sig Dispense Refill   apixaban (ELIQUIS) 5 MG TABS tablet Take 1 tablet (5 mg total) by mouth 2 (two) times daily. Pt is DUE for follow-up, Please call office [phone] to schedule follow-up appt for FUTURE refills. 60 tablet 2   Carboxymethylcellul-Glycerin (REFRESH OPTIVE OP) Place 1 drop into both eyes every morning.      diazepam (VALIUM) 2 MG tablet Take 2 mg by mouth 2 (two) times daily as needed.     fenofibrate (TRICOR) 48 MG tablet Take 48 mg by mouth daily.     gabapentin (NEURONTIN) 300 MG capsule Take 3 capsules by mouth 2 (two) times daily.  HYDROcodone-acetaminophen (NORCO/VICODIN) 5-325 MG tablet Take 1-2 tablets by mouth every 6 (six) hours as needed (for pain). 8 tablet 0   metFORMIN (GLUCOPHAGE-XR) 500 MG 24 hr tablet Take 1,000 mg by mouth 2 (two) times daily.      ondansetron (ZOFRAN) 4 MG tablet Take 4 mg by mouth every 8 (eight) hours as needed for nausea or vomiting.     Potassium Chloride ER 20 MEQ TBCR Take 1 tablet by mouth daily.     rosuvastatin (CRESTOR) 20 MG tablet Take 1 tablet (20 mg total) by mouth daily. 90 tablet 3   sildenafil (VIAGRA) 100 MG tablet Take by mouth.     spironolactone (ALDACTONE) 25 MG tablet Take 25 mg by mouth daily.     valsartan (DIOVAN) 320 MG tablet Take 320 mg by mouth daily.     No current facility-administered medications for this visit.    PAST MEDICAL HISTORY: Past Medical History:  Diagnosis Date   CAD (coronary artery disease)    Cerebrovascular disease    HLD (hyperlipidemia)    HTN (hypertension)    IBS (irritable bowel syndrome)    Lumbar stenosis    MI, old    Persistent atrial fibrillation (HCC)    PONV (postoperative nausea and vomiting)    Pre-diabetes    PVD (peripheral vascular disease) (HCC)    Stroke (cerebrum) (HCC)    Tobacco abuse     PAST SURGICAL HISTORY: Past Surgical History:  Procedure Laterality Date   ABDOMINAL AORTOGRAM W/LOWER EXTREMITY N/A 01/25/2017   Procedure: Abdominal Aortogram w/Lower Extremity;  Surgeon: Elam Dutch, MD;  Location: [place] [facility];  Service: Cardiovascular;  Laterality: N/A;   ANTERIOR CRUCIATE LIGAMENT REPAIR     CARDIAC CATHETERIZATION     [facility]   CARDIAC CATHETERIZATION N/A 07/27/2016   Procedure: Left Heart Cath and Coronary Angiography;  Surgeon: Sherren Mocha, MD;  Location: [place] [facility];  Service: Cardiovascular;  Laterality: N/A;   CARDIOVERSION N/A 06/29/2016   Procedure: CARDIOVERSION;  Surgeon: Fay Records, MD;  Location: [facility];  Service: Cardiovascular;  Laterality: N/A;   CARDIOVERSION N/A 03/05/2018   Procedure: CARDIOVERSION;  Surgeon: Larey Dresser, MD;  Location: [place];  Service: Cardiovascular;  Laterality: N/A;   CHOLECYSTECTOMY     ENDARTERECTOMY Right 12/06/2017   Procedure: ENDARTERECTOMY  CAROTID RIGHT;  Surgeon: Serafina Mitchell, MD;  Location: [place];  Service: Vascular;  Laterality: Right;   [place], 2000   x 4 times.   LOWER EXTREMITY ANGIOGRAPHY N/A 07/03/2019   Procedure: LOWER EXTREMITY ANGIOGRAPHY;  Surgeon: Elam Dutch, MD;  Location: [place] [facility];  Service: Cardiovascular;  Laterality: N/A;   LUMBAR LAMINECTOMY/DECOMPRESSION MICRODISCECTOMY Bilateral 09/09/2018   Procedure: Laminectomy and Foraminotomy bilateral Lumbar three-Lumbar four - Lumbar four-Lumbar five;  Surgeon: Earnie Larsson, MD;  Location: [place];  Service: Neurosurgery;  Laterality: Bilateral;   PATCH ANGIOPLASTY Right 12/06/2017   Procedure: PATCH ANGIOPLASTY RIGHT CAROTID ARTERY;  Surgeon: Serafina Mitchell, MD;  Location: [facility];  Service: Vascular;  Laterality: Right;   PERIPHERAL VASCULAR INTERVENTION Right 01/25/2017   Procedure: Peripheral Vascular Intervention;  Surgeon: Elam Dutch, MD;  Location: [place] [facility];  Service: Cardiovascular;  Laterality: Right;   PERIPHERAL VASCULAR INTERVENTION Right 07/03/2019   Procedure: PERIPHERAL VASCULAR INTERVENTION;  Surgeon: Elam Dutch, MD;  Location: [place] [facility];  Service: Cardiovascular;  Laterality: Right;   TONSILLECTOMY  FAMILY HISTORY: Family History  Problem Relation Age of Onset   Heart disease Father    Melanoma Father    Testicular cancer Father    Hypertension Mother    Arthritis Mother    Arthritis Sister    Lung disease Neg Hx     SOCIAL HISTORY: Social History   Socioeconomic History   Marital status: Married    Spouse name: Not on file   Number of children: 2   Years of education: Not on file   Highest education level: Not on file  Occupational History   Occupation: retired  Tobacco Use   Smoking status: Some Days    Packs/day: 0.50    Years: 50.00    Total pack years: 25.00    Types: Cigarettes    Start date: 06/02/1971    Last attempt to quit: 12/09/2017    Years  since quitting: 4.4   Smokeless tobacco: Never   Tobacco comments:    pt vapes  Vaping Use   Vaping Use: Some days   Substances: Nicotine  Substance and Sexual Activity   Alcohol use: Yes    Alcohol/week: 0.0 standard drinks of alcohol    Comment: occasionaly   Drug use: No   Sexual activity: Not on file  Other Topics Concern   Not on file  Social History Narrative   He is originally from [place]. He moved to [facility] [facility] [place] [place] in [place]. Previously has traveled to most of the [place]. Has traveled to [place]. Has been to [place], [place], & [place]. 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 Remote bird exposure in 1980. No mold exposure.    Social Determinants of Health   Financial Resource Strain: Not on file  Food Insecurity: Not on file  Transportation Needs: Not on file  Physical Activity: Not on file  Stress: Not on file  Social Connections: Not on file  Intimate Partner Violence: Not on file    Marcial Pacas, M.D. Ph.D.  [facility] [address], [place], [place] [address] Ph: [phone] Fax: [phone]  CC:  Shirline Frees, MD [place],  [place] [address]  Shirline Frees, MD    Total time spent reviewing the chart, obtaining history, examined patient, ordering tests, documentation, consultations and family, care coordination was  45 minutes

## 2022-06-14 DIAGNOSIS — I480 Paroxysmal atrial fibrillation: Secondary | ICD-10-CM | POA: Diagnosis not present

## 2022-06-14 DIAGNOSIS — E7800 Pure hypercholesterolemia, unspecified: Secondary | ICD-10-CM | POA: Diagnosis not present

## 2022-06-14 DIAGNOSIS — I2510 Atherosclerotic heart disease of native coronary artery without angina pectoris: Secondary | ICD-10-CM | POA: Diagnosis not present

## 2022-06-14 DIAGNOSIS — D6869 Other thrombophilia: Secondary | ICD-10-CM | POA: Diagnosis not present

## 2022-06-14 DIAGNOSIS — I739 Peripheral vascular disease, unspecified: Secondary | ICD-10-CM | POA: Diagnosis not present

## 2022-06-14 DIAGNOSIS — I10 Essential (primary) hypertension: Secondary | ICD-10-CM | POA: Diagnosis not present

## 2022-06-14 DIAGNOSIS — M170 Bilateral primary osteoarthritis of knee: Secondary | ICD-10-CM | POA: Diagnosis not present

## 2022-06-14 DIAGNOSIS — J439 Emphysema, unspecified: Secondary | ICD-10-CM | POA: Diagnosis not present

## 2022-06-14 DIAGNOSIS — F17200 Nicotine dependence, unspecified, uncomplicated: Secondary | ICD-10-CM | POA: Diagnosis not present

## 2022-06-14 DIAGNOSIS — G894 Chronic pain syndrome: Secondary | ICD-10-CM | POA: Diagnosis not present

## 2022-06-14 DIAGNOSIS — E1169 Type 2 diabetes mellitus with other specified complication: Secondary | ICD-10-CM | POA: Diagnosis not present

## 2022-06-19 DIAGNOSIS — E7800 Pure hypercholesterolemia, unspecified: Secondary | ICD-10-CM | POA: Diagnosis not present

## 2022-06-19 DIAGNOSIS — I10 Essential (primary) hypertension: Secondary | ICD-10-CM | POA: Diagnosis not present

## 2022-06-19 DIAGNOSIS — E1169 Type 2 diabetes mellitus with other specified complication: Secondary | ICD-10-CM | POA: Diagnosis not present

## 2022-06-27 ENCOUNTER — Encounter: Payer: Medicare Other | Admitting: Neurology

## 2022-07-04 DIAGNOSIS — R059 Cough, unspecified: Secondary | ICD-10-CM | POA: Diagnosis not present

## 2022-07-04 DIAGNOSIS — J329 Chronic sinusitis, unspecified: Secondary | ICD-10-CM | POA: Diagnosis not present

## 2022-07-04 DIAGNOSIS — J029 Acute pharyngitis, unspecified: Secondary | ICD-10-CM | POA: Diagnosis not present

## 2022-07-04 DIAGNOSIS — J40 Bronchitis, not specified as acute or chronic: Secondary | ICD-10-CM | POA: Diagnosis not present

## 2022-07-04 DIAGNOSIS — R0981 Nasal congestion: Secondary | ICD-10-CM | POA: Diagnosis not present

## 2022-08-03 DIAGNOSIS — Z23 Encounter for immunization: Secondary | ICD-10-CM | POA: Diagnosis not present

## 2022-08-03 DIAGNOSIS — E1169 Type 2 diabetes mellitus with other specified complication: Secondary | ICD-10-CM | POA: Diagnosis not present

## 2022-08-03 DIAGNOSIS — I10 Essential (primary) hypertension: Secondary | ICD-10-CM | POA: Diagnosis not present

## 2022-08-08 DIAGNOSIS — Z23 Encounter for immunization: Secondary | ICD-10-CM | POA: Diagnosis not present

## 2022-08-10 DIAGNOSIS — E1169 Type 2 diabetes mellitus with other specified complication: Secondary | ICD-10-CM | POA: Diagnosis not present

## 2022-08-10 DIAGNOSIS — I10 Essential (primary) hypertension: Secondary | ICD-10-CM | POA: Diagnosis not present

## 2022-08-10 DIAGNOSIS — E7800 Pure hypercholesterolemia, unspecified: Secondary | ICD-10-CM | POA: Diagnosis not present

## 2022-08-10 DIAGNOSIS — I2510 Atherosclerotic heart disease of native coronary artery without angina pectoris: Secondary | ICD-10-CM | POA: Diagnosis not present

## 2022-09-14 DIAGNOSIS — I2510 Atherosclerotic heart disease of native coronary artery without angina pectoris: Secondary | ICD-10-CM | POA: Diagnosis not present

## 2022-09-14 DIAGNOSIS — I10 Essential (primary) hypertension: Secondary | ICD-10-CM | POA: Diagnosis not present

## 2022-09-14 DIAGNOSIS — E1169 Type 2 diabetes mellitus with other specified complication: Secondary | ICD-10-CM | POA: Diagnosis not present

## 2022-09-14 DIAGNOSIS — E7800 Pure hypercholesterolemia, unspecified: Secondary | ICD-10-CM | POA: Diagnosis not present

## 2022-09-24 DIAGNOSIS — C801 Malignant (primary) neoplasm, unspecified: Secondary | ICD-10-CM

## 2022-09-24 HISTORY — DX: Malignant (primary) neoplasm, unspecified: C80.1

## 2022-11-30 ENCOUNTER — Other Ambulatory Visit: Payer: Self-pay

## 2022-11-30 MED ORDER — APIXABAN 5 MG PO TABS: 5.0000 mg | ORAL_TABLET | Freq: Two times a day (BID) | ORAL | 2 refills | Status: DC

## 2022-11-30 NOTE — Telephone Encounter (Addendum)
Received faxed Eliquis refill request from [place].  Pt last saw Dr Acie Fredrickson 01/19/22, last labs 06/14/22 Creat 1.08 at [facility] per [facility].  Age 72, weight 76.7kg, based on specified criteria pt is on appropriate dosage of Eliquis '5mg'$  BID for afib.  Will refill rx.

## 2023-01-02 ENCOUNTER — Other Ambulatory Visit: Payer: Self-pay

## 2023-01-02 DIAGNOSIS — I739 Peripheral vascular disease, unspecified: Secondary | ICD-10-CM

## 2023-01-02 DIAGNOSIS — I6523 Occlusion and stenosis of bilateral carotid arteries: Secondary | ICD-10-CM

## 2023-01-02 DIAGNOSIS — Z95820 Peripheral vascular angioplasty status with implants and grafts: Secondary | ICD-10-CM

## 2023-01-07 NOTE — Progress Notes (Unsigned)
Office Note     CC:  follow up Requesting Provider:  Johny Blamer, MD  HPI: Scott [name] is a 72 y.o. (Sep 08, 1951) male who presents for routine follow up of PAD and Carotid artery disease.  He has history of Right SFA stent placed in May 2018 by Dr. Darrick Penna for RLE claudication and Right CEA for symptomatic disease in March 2019 by Dr. Myra Gianotti. He had right brain stroke.  At his last visit in March of 2023 he was having a lot of dizzy spells mostly when bending over or standing up too quickly. I had recommended he follow up with his PCP and cardiologist regarding this as I felt it was related to orthostatic hypotension he says this has not been happening recently. He has been feeling better but still is challenged at times with his legs hurting and lower back hurting if he does too much. He denies any pain in his legs on ambulation, rest or any non healing wounds. He has been exercising at the [facility] both lifting and also walking or swimming. He feels much better since starting this. He does continue to smoke about 1 ppd.   He denies any visual changes, slurred speech, facial drooping, unilateral upper or lower extremity weakness or numbness  Since his last visit he has been started on Insulin. He feels much better since switching to this. Still is learning to manage his blood sugars and watch his dietary intake. Explains that he and his wife have a very large garden so he is trying to eat better  The pt is on a statin for cholesterol management.  The pt is not on a daily aspirin.   Other AC:  Eliquis The pt is on ARB, CCB for hypertension.   The pt is diabetic.   Tobacco hx:  current, 1 ppd  Past Medical History:  Diagnosis Date   CAD (coronary artery disease)    Cerebrovascular disease    HLD (hyperlipidemia)    HTN (hypertension)    IBS (irritable bowel syndrome)    Lumbar stenosis    MI, old    Persistent atrial fibrillation    PONV (postoperative nausea and vomiting)     Pre-diabetes    PVD (peripheral vascular disease)    Stroke (cerebrum)    Tobacco abuse     Past Surgical History:  Procedure Laterality Date   ABDOMINAL AORTOGRAM W/LOWER EXTREMITY N/A 01/25/2017   Procedure: Abdominal Aortogram w/Lower Extremity;  Surgeon: Sherren Kerns, MD;  [facility] LAB;  Service: Cardiovascular;  Laterality: N/A;   ANTERIOR CRUCIATE LIGAMENT REPAIR     CARDIAC CATHETERIZATION     CONE   CARDIAC CATHETERIZATION N/A 07/27/2016   Procedure: Left Heart Cath and Coronary Angiography;  Surgeon: Tonny Bollman, MD;  Location: [facility] INVASIVE CV LAB;  Service: Cardiovascular;  Laterality: N/A;   CARDIOVERSION N/A 06/29/2016   Procedure: CARDIOVERSION;  Surgeon: Pricilla Riffle, MD;  Location: [facility] ENDOSCOPY;  Service: Cardiovascular;  Laterality: N/A;   CARDIOVERSION N/A 03/05/2018   Procedure: CARDIOVERSION;  Surgeon: Laurey Morale, MD;  Location: [facility] ENDOSCOPY;  Service: Cardiovascular;  Laterality: N/A;   CHOLECYSTECTOMY     ENDARTERECTOMY Right 12/06/2017   Procedure: ENDARTERECTOMY CAROTID RIGHT;  Surgeon: Nada Libman, MD;  Location: [facility] OR;  Service: Vascular;  Laterality: Right;   KNEE SURGERY  1982, 1988, 1997, 2000   x 4 times.   LOWER EXTREMITY ANGIOGRAPHY N/A 07/03/2019   Procedure: LOWER EXTREMITY ANGIOGRAPHY;  Surgeon: Darrick Penna,  Janetta Hora, MD;  [facility] INVAS[facility] CV LAB;  Service: Cardiovascular;  Laterality: N/A;   LUMBAR LAMINECTOMY/DECOMPRESSION MICRODISCECTOMY Bilateral 09/09/2018   Procedure: Laminectomy and Foraminotomy bilateral Lumbar three-Lumbar four - Lumbar four-Lumbar five;  Surgeon: Julio Sicks, MD;  Location: [facility] OR;  Service: Neurosurgery;  Laterality: Bilateral;   PATCH ANGIOPLASTY Right 12/06/2017   Procedure: PATCH ANGIOPLASTY RIGHT CAROTID ARTERY;  Surgeon: Nada Libman, MD;  Location: [facility] OR;  Service: Vascular;  Laterality: Right;   PERIPHERAL VASCULAR INTERVENTION Right 01/25/2017   Procedure: Peripheral Vascular Intervention;   Surgeon: Sherren Kerns, MD;  Location: [facility] INVASIVE CV LAB;  Service: Cardiovascular;  Laterality: Right;   PERIPHERAL VASCULAR INTERVENTION Right 07/03/2019   Procedure: PERIPHERAL VASCULAR INTERVENTION;  Surgeon: Sherren Kerns, MD;  [facility] INVAS[facility] CV LAB;  Service: Cardiovascular;  Laterality: Right;   TONSILLECTOMY      Social History   Socioeconomic History   Marital status: Married    Spouse name: Not on file   Number of children: 2   Years of education: Not on file   Highest education level: Not on file  Occupational History   Occupation: retired  Tobacco Use   Smoking status: Every Day    Packs/day: 1.00    Years: 50.00    Additional pack years: 0.00    Total pack years: 50.00    Types: Cigarettes    Start date: 06/02/1971    Last attempt to quit: 12/09/2017    Years since quitting: 5.0   Smokeless tobacco: Never   Tobacco comments:    pt vapes  Vaping Use   Vaping Use: Some days   Substances: Nicotine  Substance and Sexual Activity   Alcohol use: Yes    Alcohol/week: 0.0 standard drinks of alcohol    Comment: occasionaly   Drug use: No   Sexual activity: Not on file  Other Topics Concern   Not on file  Social History Narrative   He is originally from [place]. He moved to [facility] in [place]. Previously has traveled to most of the [place]. Has traveled to [place] & [place]. Has been to [place], [place], & [place] 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 Has a dog currently. Remote bird exposure in 1980. No mold exposure.    Social Determinants of Health   Financial Resource Strain: Not on file  Food Insecurity: Not on file  Transportation Needs: Not on file  Physical Activity: Not on file  Stress: Not on file  Social Connections: Not on file  Intimate Partner Violence: Not on file    Family History  Problem Relation Age of Onset   Heart disease Father    Melanoma Father    Testicular cancer Father    Hypertension Mother    Arthritis Mother     Arthritis Sister    Lung disease Neg Hx     Current Outpatient Medications  Medication Sig Dispense Refill   amLODipine (NORVASC) 5 MG tablet Take 5 mg by mouth daily.     apixaban (ELIQUIS) 5 MG TABS tablet Take 1 tablet (5 mg total) by mouth 2 (two) times daily. Pt is DUE for follow-up, Please call office [phone] to schedule follow-up appt for FUTURE refills. 60 tablet 2   aspirin EC 81 MG tablet Take 81 mg by mouth daily.     Carboxymethylcellul-Glycerin (REFRESH OPTIVE OP) Place 1 drop into both eyes every morning.      diazepam (VALIUM) 2 MG tablet Take 2 mg by mouth  2 (two) times daily as needed.     fenofibrate (TRICOR) 48 MG tablet Take 48 mg by mouth daily.     gabapentin (NEURONTIN) 300 MG capsule Take 3 capsules by mouth 2 (two) times daily.     glipiZIDE (GLUCOTROL XL) 5 MG 24 hr tablet Take 5 mg by mouth daily with breakfast.     HYDROcodone-acetaminophen (NORCO/VICODIN) 5-325 MG tablet Take 1-2 tablets by mouth every 6 (six) hours as needed (for pain). 8 tablet 0   JARDIANCE 25 MG TABS tablet Take 25 mg by mouth daily.     metFORMIN (GLUCOPHAGE-XR) 500 MG 24 hr tablet Take 1,000 mg by mouth 2 (two) times daily.     ondansetron (ZOFRAN) 4 MG tablet Take 4 mg by mouth every 8 (eight) hours as needed for nausea or vomiting.     Potassium Chloride ER 20 MEQ TBCR Take 1 tablet by mouth daily.     rosuvastatin (CRESTOR) 20 MG tablet Take 1 tablet (20 mg total) by mouth daily. 90 tablet 3   sildenafil (VIAGRA) 100 MG tablet Take by mouth.     spironolactone (ALDACTONE) 25 MG tablet Take 25 mg by mouth daily.     valsartan (DIOVAN) 320 MG tablet Take 320 mg by mouth daily.     No current facility-administered medications for this visit.    Allergies  Allergen Reactions   Augmentin [Amoxicillin-Pot Clavulanate] Rash     REVIEW OF SYSTEMS:    denotes positive finding,  denotes negative finding Cardiac  Comments:  Chest pain or chest pressure:    Shortness of  breath upon exertion:    Short of breath when lying flat:    Irregular heart rhythm:        Vascular    Pain in calf, thigh, or hip brought on by ambulation:    Pain in feet at night that wakes you up from your sleep:     Blood clot in your veins:    Leg swelling:         Pulmonary    Oxygen at home:    Productive cough:     Wheezing:         Neurologic    Sudden weakness in arms or legs:     Sudden numbness in arms or legs:     Sudden onset of difficulty speaking or slurred speech:    Temporary loss of vision in one eye:     Problems with dizziness:         Gastrointestinal    Blood in stool:     Vomited blood:         Genitourinary    Burning when urinating:     Blood in urine:        Psychiatric    Major depression:         Hematologic    Bleeding problems:    Problems with blood clotting too easily:        Skin    Rashes or ulcers:        Constitutional    Fever or chills:      PHYSICAL EXAMINATION:  Vitals:   01/10/23 1442 01/10/23 1444  BP: (!) 148/70 138/72  Pulse: (!) 51   Resp: 20   Temp: 98.6 F (37 C)   SpO2: 96%   Weight: 162 lb (73.5 kg)   Height:  (1.778 m)     General:  WDWN in NAD; vital signs documented above Gait: Normal  HENT: WNL, normocephalic Pulmonary: normal non-labored breathing , without wheezing Cardiac: regular HR Abdomen: soft, NT, no masses Vascular Exam/Pulses: 2+ femoral pulses bilaterally, 2+ DP pulses bilaterally, feet are warm and well perfused Extremities: without ischemic changes, without Gangrene , without cellulitis; without open wounds Musculoskeletal: no muscle wasting or atrophy  Neurologic: A&O X 3 Psychiatric:  The pt has Normal affect.   Non-Invasive Vascular Imaging:   +-------+-----------+-----------+------------+------------+  ABI/TBIToday's ABIToday's TBIPrevious ABIPrevious TBI  +-------+-----------+-----------+------------+------------+  Right 1.08       0.65       0.94         0.66          +-------+-----------+-----------+------------+------------+  Left  1.03       0.80       1.02        0.74          +-------+-----------+-----------+------------+------------+    VAS US Carotid duplex: Summary:  Right Carotid: Velocities in the right ICA are consistent with a 40-59% stenosis.   Left Carotid: Velocities in the left ICA are consistent with a 1-39% stenosis.  The ECA appears >50% stenosed.   Vertebrals:  Bilateral vertebral arteries demonstrate antegrade flow.  Subclavians: Normal flow hemodynamics were seen in bilateral subclavian arteries.   VAS [place] Lower extremity arterial duplex: Summary:  Right: 50-74% stenosis noted in the deep femoral artery. 30-49% stenosis noted in the superficial femoral artery. Patent SFA stent with slight increase of velocity in the distal segment in the 1-49% stenosis    ASSESSMENT/PLAN:: 72 y.o. male here for follow up for PAD and Carotid artery disease. His has not had any lower extremity symptoms. He is without claudication, rest pain or tissue loss. He has generalized weakness in is legs which has been on going for several years and is related to his back. His ABI's today are essentially unchanged from last year. His right lower extremity with patent SFA stent. Stable tibial disease. Congratulated him on his exercise regimen and encourage him to continue. Discussed smoking cessation and encourage him to stop smoking. He says he will work on this   In regard to his carotid disease he is not having any neurological symptoms. Duplex today shows right ICA with 40-59%. Left ICA with 1-39% stenosis. Normal flow in vertebral and subclavian arteries - continue Eliquis, Statin - He will call for earlier follow up if he has new or concerning symptoms  - He will return in 1 year with repeat carotid duplex and ABI's   Graceann Congress, PA-C [facility] [phone]  Clinic MD:   Edilia Bo

## 2023-01-10 ENCOUNTER — Ambulatory Visit (INDEPENDENT_AMBULATORY_CARE_PROVIDER_SITE_OTHER)
Admission: RE | Admit: 2023-01-10 | Discharge: 2023-01-10 | Disposition: A | Discharge status: Home / Self Care | Payer: Medicare Other | Source: Ambulatory Visit | Attending: Vascular Surgery | Admitting: Vascular Surgery

## 2023-01-10 ENCOUNTER — Ambulatory Visit (HOSPITAL_COMMUNITY)
Admission: RE | Admit: 2023-01-10 | Discharge: 2023-01-10 | Disposition: A | Discharge status: Home / Self Care | Payer: Medicare Other | Source: Ambulatory Visit | Attending: Vascular Surgery | Admitting: Vascular Surgery

## 2023-01-10 ENCOUNTER — Ambulatory Visit (INDEPENDENT_AMBULATORY_CARE_PROVIDER_SITE_OTHER): Payer: Medicare Other | Admitting: Physician Assistant

## 2023-01-10 VITALS — BP 138/72 | HR 51 | Temp 98.6°F | Resp 20 | Ht 70.0 in | Wt 162.0 lb

## 2023-01-10 DIAGNOSIS — I739 Peripheral vascular disease, unspecified: Secondary | ICD-10-CM

## 2023-01-10 DIAGNOSIS — Z95820 Peripheral vascular angioplasty status with implants and grafts: Secondary | ICD-10-CM | POA: Diagnosis not present

## 2023-01-10 DIAGNOSIS — Z9889 Other specified postprocedural states: Secondary | ICD-10-CM

## 2023-01-10 DIAGNOSIS — I6523 Occlusion and stenosis of bilateral carotid arteries: Secondary | ICD-10-CM | POA: Diagnosis not present

## 2023-01-10 LAB — VAS US ABI WITH/WO TBI
Left ABI: 1.03
Right ABI: 1.08

## 2023-01-13 ENCOUNTER — Other Ambulatory Visit: Payer: Self-pay | Admitting: Acute Care

## 2023-01-13 DIAGNOSIS — F17210 Nicotine dependence, cigarettes, uncomplicated: Secondary | ICD-10-CM

## 2023-01-13 DIAGNOSIS — Z87891 Personal history of nicotine dependence: Secondary | ICD-10-CM

## 2023-01-13 DIAGNOSIS — Z122 Encounter for screening for malignant neoplasm of respiratory organs: Secondary | ICD-10-CM

## 2023-02-06 ENCOUNTER — Ambulatory Visit
Admission: RE | Admit: 2023-02-06 | Discharge: 2023-02-06 | Disposition: A | Discharge status: Home / Self Care | Payer: Medicare Other | Source: Ambulatory Visit

## 2023-02-06 DIAGNOSIS — Z122 Encounter for screening for malignant neoplasm of respiratory organs: Secondary | ICD-10-CM

## 2023-02-06 DIAGNOSIS — F17210 Nicotine dependence, cigarettes, uncomplicated: Secondary | ICD-10-CM

## 2023-02-06 DIAGNOSIS — Z87891 Personal history of nicotine dependence: Secondary | ICD-10-CM

## 2023-02-11 ENCOUNTER — Other Ambulatory Visit: Payer: Self-pay | Admitting: Acute Care

## 2023-02-11 DIAGNOSIS — Z122 Encounter for screening for malignant neoplasm of respiratory organs: Secondary | ICD-10-CM

## 2023-02-11 DIAGNOSIS — F17210 Nicotine dependence, cigarettes, uncomplicated: Secondary | ICD-10-CM

## 2023-02-11 DIAGNOSIS — Z87891 Personal history of nicotine dependence: Secondary | ICD-10-CM

## 2023-03-05 ENCOUNTER — Other Ambulatory Visit: Payer: Self-pay | Admitting: Cardiovascular Disease

## 2023-03-05 DIAGNOSIS — I4891 Unspecified atrial fibrillation: Secondary | ICD-10-CM

## 2023-03-05 NOTE — Telephone Encounter (Addendum)
Eliquis 5mg  refill request received. Patient is 72 years old, weight-73.5kg, Crea-1.08 on 06/14/22 via [facility] from [facility], Diagnosis-Afib, and last seen by Dr. Elease Hashimoto on 01/19/22-NEEDS APPT. Dose is appropriate based on dosing criteria.   Pt needs an appt with Cardiologist. Message sent to Schedulers.   Pt has an appt scheduled on 03/12/23 with Dr. Elease Hashimoto. Will send in refill at this time. Note placed on appt that he needs labs.

## 2023-03-11 NOTE — Progress Notes (Unsigned)
Cardiology Office Note   Date:  03/12/2023   ID:  Scott [name], Scott [name] 05-09-1951, MRN [id number]  PCP:  [name], MD  Cardiologist:   Kristeen Miss, MD   Chief Complaint  Patient presents with   Coronary Artery Disease        Atrial Fibrillation        Problem List 1. CAD - s/p Inf. MI 2. Anxiety 3. Hyperlipidemia 4. Essential hypertension 5. Atrial fibrillation-  S/p Afib ablation in [facility] 2021 6. PAD - stenting of R SFA , Fields  7. Right CEA - Taim Milia is a 72 y.o. male who presents for pre-op clearance prior to a colonoscopy.  He is on plavix and the GI team wanted permission to stop.  Feels fine.  No CP. Not breathing was well.  Since last fall, his breathing has not been as good.  Has had lots of bronchial type issues.   Has taked multiple rounds of antibiotics.   Has not been exercising .  Retired last year.    Still smoking .   Has been trying to cut back  Denies any palpitations.    Sept. 15, 2017: Scott [name] is here to follow up for  atrial fib  Is on the Eliquis regularly  Has been diagnosed with borderline DM  Has been seen by Dr. Jamison Neighbor for shortness of breath .  Has had progressive shortness of breath . Has knee problems and shoulder issues Has been walking regularly  - walks 2 miles a day   Has not had any CP  No pains similar to his CP prior to stenting    Oct. 30, 2017:  Scott [name] is seen for follow up of his atrial fib .  Still short of breath  The cardioversion may have helped the shortness of breath but is not as good as he would like to be   Dec. 11 , 2017:  Scott [name] seems to be slightly  doing better. He had a cardiac catheterization on November 3. He had a widely patent left anterior descending artery. The right coronary artery stent was widely patent. The left circumflex artery was totally occluded and had right to left collaterals. His left ventricular systolic function was normal with an EF of 55-60%. He  had normal left ventricular end-diastolic pressure.  Staying busy with yard work  - getting up leaves .   Still smoking   Wants to consider cardioversion Flecainide is not an option due to CAD Amio is not a good choice due to possible  lung disease.   Aug. 22, 2018:  Scott [name] is seen today for follow-up visit. He had peripheral vascular stenting  in May with Dr. Darrick Penna. Is still smoking  Has had cardioversion for his atrial fib - only last a short time  Has been seen in the Afib clinic - discussed Tikosyn. Did not want to do that.  Has good days and bad days .  January 17, 2021: Scott [name] is seen today after a 4 year absence He has seen Tereso Newcomer, [place] via telemedicine last year Has hearing aids.  Has leg weakness.  Was found to have a a pinched nerve causing his leg weakness  Received steroid shots,  Had back surgery   Did not help as much as he would have hoped .  Had steroid injections   Had a cath - looked ok Had right carotid stenosis - had right CEA.  Had an  afib ablation in [facility] - has been successful   Saw neurosurgery again  2nd opinion at [facility] .   EMGs show long term nerve damage   No recent CP ,    Has considerable leg and hip weakness with any amount of walking .    January 19, 2022 Scott [name] is seen  No angina  Has lots of leg pain - related to his back issues Has residual nerve damage from the surgery . Legs do not hurt,  just are very weak  Tried physical therapy.  He lasted only 3 weeks but then found it to be too hard   Had some success at the [facility] program ,   Cannot quit smoking  Has maintained NSR after his Afib ablation   Has found that he has reactions to the chlorine at the [facility] Does OK with a saline pool (at [facility])   I suggested that he find an indoor pool that he can use throughout the year.    March 12, 2023 Scott [name] is seen for follow up of his CAD, PAD, Still smoking  - 1 ppd . Sometimes more , sometimes less   Has continued to have back  pain ,  Bilateral leg weakness   Is able to get out on occasion to do some yard work  But is exhausted afterward  Swims in the pool on occasion   Still goes to [facility] for PAD duplex screening   His lipids are managing his lipids  Last LDL is 68  ( Tourist information centre manager, Pharm D)   Will see him in a year     Past Medical History:  Diagnosis Date   CAD (coronary artery disease)    Cerebrovascular disease    HLD (hyperlipidemia)    HTN (hypertension)    IBS (irritable bowel syndrome)    Lumbar stenosis    MI, old    Persistent atrial fibrillation (HCC)    PONV (postoperative nausea and vomiting)    Pre-diabetes    PVD (peripheral vascular disease) (HCC)    Stroke (cerebrum) (HCC)    Tobacco abuse     Past Surgical History:  Procedure Laterality Date   ABDOMINAL AORTOGRAM W/LOWER EXTREMITY N/A 01/25/2017   Procedure: Abdominal Aortogram w/Lower Extremity;  Surgeon: Sherren Kerns, MD;  [facility] INVASIVE CV LAB;  Service: Cardiovascular;  Laterality: N/A;   ANTERIOR CRUCIATE LIGAMENT REPAIR     CARDIAC CATHETERIZATION     CONE   CARDIAC CATHETERIZATION N/A 07/27/2016   Procedure: Left Heart Cath and Coronary Angiography;  Surgeon: Tonny Bollman, MD;  Location: [facility] INVASIVE CV LAB;  Service: Cardiovascular;  Laterality: N/A;   CARDIOVERSION N/A 06/29/2016   Procedure: CARDIOVERSION;  Surgeon: Pricilla Riffle, MD;  Location: [facility] ENDOSCOPY;  Service: Cardiovascular;  Laterality: N/A;   CARDIOVERSION N/A 03/05/2018   Procedure: CARDIOVERSION;  Surgeon: Laurey Morale, MD;  Location: [facility] ENDOSCOPY;  Service: Cardiovascular;  Laterality: N/A;   CHOLECYSTECTOMY     ENDARTERECTOMY Right 12/06/2017   Procedure: ENDARTERECTOMY CAROTID RIGHT;  Surgeon: Nada Libman, MD;  Location: [facility] OR;  Service: Vascular;  Laterality: Right;   KNEE SURGERY  1982, 1988, 1997, 2000   x 4 times.   LOWER EXTREMITY ANGIOGRAPHY N/A 07/03/2019   Procedure: LOWER EXTREMITY ANGIOGRAPHY;  Surgeon: Sherren Kerns,  MD;  [facility] INVASIVE CV LAB;  Service: Cardiovascular;  Laterality: N/A;   LUMBAR LAMINECTOMY/DECOMPRESSION MICRODISCECTOMY Bilateral 09/09/2018   Procedure: Laminectomy and Foraminotomy bilateral Lumbar three-Lumbar four -  Lumbar four-Lumbar five;  Surgeon: Julio Sicks, MD;  Location: [facility] OR;  Service: Neurosurgery;  Laterality: Bilateral;   PATCH ANGIOPLASTY Right 12/06/2017   Procedure: PATCH ANGIOPLASTY RIGHT CAROTID ARTERY;  Surgeon: Nada Libman, MD;  [facility] OR;  Service: Vascular;  Laterality: Right;   PERIPHERAL VASCULAR INTERVENTION Right 01/25/2017   Procedure: Peripheral Vascular Intervention;  Surgeon: Sherren Kerns, MD;  Location: [facility] INVASIVE CV LAB;  Service: Cardiovascular;  Laterality: Right;   PERIPHERAL VASCULAR INTERVENTION Right 07/03/2019   Procedure: PERIPHERAL VASCULAR INTERVENTION;  Surgeon: Sherren Kerns, MD;  [facility] INVASIVE CV LAB;  Service: Cardiovascular;  Laterality: Right;   TONSILLECTOMY       Current Outpatient Medications  Medication Sig Dispense Refill   amLODipine (NORVASC) 5 MG tablet Take 5 mg by mouth daily.     apixaban (ELIQUIS) 5 MG TABS tablet Take 1 tablet (5 mg total) by mouth 2 (two) times daily. Please keep upcoming appointment. Thanks. 60 tablet 1   aspirin EC 81 MG tablet Take 81 mg by mouth daily.     Carboxymethylcellul-Glycerin (REFRESH OPTIVE OP) Place 1 drop into both eyes every morning.      diazepam (VALIUM) 2 MG tablet Take 2 mg by mouth 2 (two) times daily as needed.     fenofibrate (TRICOR) 48 MG tablet Take 48 mg by mouth daily.     gabapentin (NEURONTIN) 300 MG capsule Take 3 capsules by mouth 2 (two) times daily.     glipiZIDE (GLUCOTROL XL) 5 MG 24 hr tablet Take 5 mg by mouth daily with breakfast.     HYDROcodone-acetaminophen (NORCO/VICODIN) 5-325 MG tablet Take 1-2 tablets by mouth every 6 (six) hours as needed (for pain). 8 tablet 0   JARDIANCE 25 MG TABS tablet Take 25 mg by mouth daily.      metFORMIN (GLUCOPHAGE-XR) 500 MG 24 hr tablet Take 1,000 mg by mouth 2 (two) times daily.     ondansetron (ZOFRAN) 4 MG tablet Take 4 mg by mouth every 8 (eight) hours as needed for nausea or vomiting.     rosuvastatin (CRESTOR) 20 MG tablet Take 1 tablet (20 mg total) by mouth daily. 90 tablet 3   sildenafil (VIAGRA) 100 MG tablet Take by mouth.     spironolactone (ALDACTONE) 25 MG tablet Take 25 mg by mouth daily.     valsartan (DIOVAN) 320 MG tablet Take 320 mg by mouth daily.     No current facility-administered medications for this visit.    Allergies:   Augmentin [amoxicillin-pot clavulanate]    Social History:  The patient  reports that he has been smoking cigarettes. He started smoking about 51 years ago. He has a 50.00 pack-year smoking history. He has never used smokeless tobacco. He reports current alcohol use. He reports that he does not use drugs.   Family History:  [name] family history includes Arthritis in his mother and sister; Heart disease in his father; Hypertension in his mother; Melanoma in his father; Testicular cancer in his father.    ROS:  Please see the history of present illness.    Physical Exam: Blood pressure 138/79, pulse (!) 55, height 5' 9.75" (1.772 m), weight 156 lb 12.8 oz (71.1 kg), SpO2 96 %.       GEN:  Well nourished, well developed in no acute distress HEENT: Normal NECK: No JVD; No carotid bruits LYMPHATICS: No lymphadenopathy CARDIAC: RRR , no murmurs, rubs, gallops RESPIRATORY:  Clear to auscultation without rales, wheezing  or rhonchi  ABDOMEN: Soft, non-tender, non-distended MUSCULOSKELETAL:  No edema; No deformity  SKIN: Warm and dry NEUROLOGIC:  Alert and oriented x 3   EKG Interpretation  Date/Time:  Tuesday March 12 2023 10:53:48 EDT Ventricular Rate:  55 PR Interval:  156 QRS Duration: 108 QT Interval:  460 QTC Calculation: 440 R Axis:   -21 Text Interpretation: Sinus bradycardia Nonspecific ST and T wave  abnormality When compared with ECG of 03-Jul-2019 06:29, Incomplete right bundle branch block is no longer Present Confirmed by Kristeen Miss ([id number]) on 03/12/2023 11:01:48 AM    Recent Labs: No results found for requested labs within last 365 days.    Lipid Panel    Component Value Date/Time   CHOL 133 02/05/2022 1130   TRIG 172 (H) 02/05/2022 1130   HDL 36 (L) 02/05/2022 1130   CHOLHDL 3.7 02/05/2022 1130   CHOLHDL 3.1 12/05/2017 0858   VLDL 10 12/05/2017 0858   LDLCALC 68 02/05/2022 1130      Wt Readings from Last 3 Encounters:  03/12/23 156 lb 12.8 oz (71.1 kg)  01/10/23 162 lb (73.5 kg)  06/06/22 169 lb (76.7 kg)      Other studies Reviewed: Additional studies/ records that were reviewed today include: . Review of the above records demonstrates:    ASSESSMENT AND PLAN:  1.  Atrial fib:    is maintaining NSR  He has a kardia mobile and checks his HR occasionally     2. CAD : no angina   3. Essential hypertension:   BP is well controlled.   Advised him to stop smoking , exercise when his back pain allows   4. Hyperlipidemia: LDL is 68.   Managed by Linward Headland , PharmD at [facility]  His goal LDL is < 70  Current medicines are reviewed at length with the patient today.  The patient does not have concerns regarding medicines.  The following changes have been made:  no change  Labs/ tests ordered today include:   Orders Placed This Encounter  Procedures   EKG 12-Lead   Disposition:         Kristeen Miss, MD  03/12/2023 11:17 AM    [facility] [address] [place], [place], [place]  [address] Phone: [phone]; Fax: [phone]

## 2023-03-12 ENCOUNTER — Encounter: Payer: Self-pay | Admitting: Cardiovascular Disease

## 2023-03-12 ENCOUNTER — Ambulatory Visit: Payer: Medicare Other | Attending: Cardiovascular Disease | Admitting: Cardiovascular Disease

## 2023-03-12 VITALS — BP 138/79 | HR 55 | Ht 69.75 in | Wt 156.8 lb

## 2023-03-12 DIAGNOSIS — I739 Peripheral vascular disease, unspecified: Secondary | ICD-10-CM

## 2023-03-12 DIAGNOSIS — E785 Hyperlipidemia, unspecified: Secondary | ICD-10-CM

## 2023-03-12 DIAGNOSIS — I2510 Atherosclerotic heart disease of native coronary artery without angina pectoris: Secondary | ICD-10-CM

## 2023-03-12 NOTE — Patient Instructions (Signed)
Medication Instructions:  Your physician recommends that you continue on your current medications as directed. Please refer to the Current Medication list given to you today.  *If you need a refill on your cardiac medications before your next appointment, please call your pharmacy*     Follow-Up: At [facility], you and your health needs are our priority.  As part of our continuing mission to provide you with exceptional heart care, we have created designated Provider Care Teams.  These Care Teams include your primary Cardiologist (physician) and Advanced Practice Providers (APPs -  Physician Assistants and Nurse Practitioners) who all work together to provide you with the care you need, when you need it.   Your next appointment:   1 year(s)  Provider:   Philip [name], MD     

## 2023-04-05 ENCOUNTER — Other Ambulatory Visit: Payer: Self-pay | Admitting: Family Medicine

## 2023-04-05 DIAGNOSIS — R1031 Right lower quadrant pain: Secondary | ICD-10-CM

## 2023-04-11 ENCOUNTER — Ambulatory Visit
Admission: RE | Admit: 2023-04-11 | Discharge: 2023-04-11 | Disposition: A | Discharge status: Home / Self Care | Payer: Medicare Other | Source: Ambulatory Visit | Attending: Family Medicine | Admitting: Family Medicine

## 2023-04-11 DIAGNOSIS — R1031 Right lower quadrant pain: Secondary | ICD-10-CM

## 2023-04-11 MED ORDER — IOPAMIDOL (ISOVUE-370) INJECTION 76%
80.0000 mL | Freq: Once | INTRAVENOUS | Status: AC | PRN
  Administered 2023-04-11: 80 mL via INTRAVENOUS

## 2023-04-17 ENCOUNTER — Other Ambulatory Visit: Payer: Self-pay | Admitting: Family Medicine

## 2023-04-17 DIAGNOSIS — M5416 Radiculopathy, lumbar region: Secondary | ICD-10-CM

## 2023-04-23 ENCOUNTER — Other Ambulatory Visit: Payer: Self-pay

## 2023-04-23 ENCOUNTER — Other Ambulatory Visit: Payer: Medicare Other

## 2023-04-23 ENCOUNTER — Inpatient Hospital Stay: Payer: Medicare Other | Admitting: Hematology

## 2023-04-23 VITALS — BP 178/88 | HR 86 | Temp 97.5°F | Resp 18 | Ht 70.0 in | Wt 155.2 lb

## 2023-04-23 DIAGNOSIS — M549 Dorsalgia, unspecified: Secondary | ICD-10-CM | POA: Insufficient documentation

## 2023-04-23 DIAGNOSIS — R634 Abnormal weight loss: Secondary | ICD-10-CM | POA: Insufficient documentation

## 2023-04-23 DIAGNOSIS — D378 Neoplasm of uncertain behavior of other specified digestive organs: Secondary | ICD-10-CM | POA: Diagnosis not present

## 2023-04-23 DIAGNOSIS — K8689 Other specified diseases of pancreas: Secondary | ICD-10-CM

## 2023-04-23 DIAGNOSIS — R109 Unspecified abdominal pain: Secondary | ICD-10-CM | POA: Diagnosis not present

## 2023-04-23 DIAGNOSIS — G8929 Other chronic pain: Secondary | ICD-10-CM

## 2023-04-23 DIAGNOSIS — F17210 Nicotine dependence, cigarettes, uncomplicated: Secondary | ICD-10-CM | POA: Insufficient documentation

## 2023-04-23 DIAGNOSIS — R933 Abnormal findings on diagnostic imaging of other parts of digestive tract: Secondary | ICD-10-CM | POA: Insufficient documentation

## 2023-04-23 NOTE — Progress Notes (Signed)
I met with Mr Scott [name] and his wife after his consultation with Dr Mosetta Putt.  I explained my role as a nurse navigator and provided my contact information.  I told them Dr Hulen Shouts office will each out regarding the EUS for biopsy.  I told him I will make his follow up appt with Dr Mosetta Putt when I know the date of his biopsy.  All questions were answered.  He verbalized understanding.

## 2023-04-23 NOTE — H&P (View-Only) (Signed)
[facility]   Telephone:(336) [phone] Fax:([phone]) [phone]   Clinic New Consult Note   Patient Care Team: [name], MD as PCP - General [name], [name], MD as PCP - Cardiology (Cardiology) [name], MD as Consulting Physician (Hematology)  Date of Service:  04/23/2023   CHIEF COMPLAINTS/PURPOSE OF CONSULTATION:  Pancreatic mass   REFERRING PHYSICIAN:  [name], MD   ASSESSMENT & PLAN:  Scott [name] is a 72 y.o.  male with a history of   1. Pancreatic mass  -Patient presented with abdominal pain and weight loss, CT scan revealed a 4.4 x 3.5 cm ill-defined mass in the pancreatic tail, which 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  This is highly suspicious for pancreatic cancer. -I personally reviewed his CT scan images with patient and his wife, and I recommend EUS for biopsy.  I will reach out to Dr. Dulce Sellar -I will check tumor marker CA 19.9 today.  His recent CBC and CMP were unremarkable. -I will also refer him to pancreatobiliary surgeon [name] or Dr. Freida Busman for surgical evaluation -Will review his case in our GI tumor board -If his biopsy confirms adenocarcinoma, then we will decide if he needs neoadjuvant chemotherapy, versus upfront surgery.  I discussed that his pancreatic cancer is likely resectable, but he may need splenectomy at same time.  If he undergoes surgical resection first, I will recommend adjuvant chemotherapy if he recovers well. -Follow-up after biopsy.  Abdominal pain -He is on Norco for his chronic back pain after his surgery, this is managed by his primary care physician Dr. Tiburcio Pea -He was discussed with Dr. Tiburcio Pea to escalating his narcotics due to his abdominal pain.  Especially he has an upcoming trip in 2 weeks.    PLAN:  -recommend  EUS Endoscopy biopsy -I will reach out to Dr. Dulce Sellar for biopsy -I referred to Dr. Freida Busman or Dr. Wayne Both -recommend eating small meals -f/u after  biopsy    HISTORY OF PRESENTING ILLNESS:  Scott [name] 72 y.o. male is a here because of Pancreatic mass . The patient was referred by [name], MD . The patient presents to the clinic today accompanied by wife.    Pt had some weight loss about 20-25 lbs in past 6 months, medication weight has been slightly fluctuating it is diet and exercise.  He developed abdominal pain a few months ago, he described as a dull pain across his upper and mid abdomen, the pain is constant but does fluctuate.  He has had chronic back pain since his back surgery in 2019, and has been on Norco since then which is managed by his primary care physician.  He feels that Norco does not help her abdominal pain.  He denies any other GI symptoms, no nausea, vomiting, or change of bowel habits.  His energy level is overall still good, he functions very well.  He was seen by his PCP Dr. Tiburcio Pea for his abdominal pain, a CT of abdomen pelvis with contrast was obtained on April 11, 2023, which showed a 4.4 cm ill-defined mass in the pancreatic tail, highly suspicious for malignancy.  He was referred to [place] for further evaluation.   Socially... Married Heavy smoker  REVIEW OF SYSTEMS:    Constitutional: (-)Denies fevers, chills or abnormal night sweats Eyes: (-)Denies blurriness of vision, double vision or watery eyes Ears, nose, mouth, throat, and face: (-)Denies mucositis or sore throat Respiratory: (-)Denies cough, dyspnea  or wheezes Cardiovascular: (-) Denies palpitation, chest discomfort or lower extremity swelling Gastrointestinal:  (-)Denies nausea, heartburn or change in bowel habits Skin: (-) Denies abnormal skin rashes Lymphatics: (-)Denies new lymphadenopathy or easy bruising Neurological:(-) Denies numbness, tingling or new weaknesses Behavioral/Psych: (-) Mood is stable, no new changes  All other systems were reviewed with the patient and are negative.   MEDICAL HISTORY:  Past Medical History:   Diagnosis Date   CAD (coronary artery disease)    Cerebrovascular disease    HLD (hyperlipidemia)    HTN (hypertension)    IBS (irritable bowel syndrome)    Lumbar stenosis    MI, old    Persistent atrial fibrillation (HCC)    PONV (postoperative nausea and vomiting)    Pre-diabetes    PVD (peripheral vascular disease) (HCC)    Stroke (cerebrum) (HCC)    Tobacco abuse     SURGICAL HISTORY: Past Surgical History:  Procedure Laterality Date   ABDOMINAL AORTOGRAM W/LOWER EXTREMITY N/A 01/25/2017   Procedure: Abdominal Aortogram w/Lower Extremity;  Surgeon: Sherren Kerns, MD;  Location: [facility] INVASIVE CV LAB;  Service: Cardiovascular;  Laterality: N/A;   ANTERIOR CRUCIATE LIGAMENT REPAIR     CARDIAC CATHETERIZATION     CONE   CARDIAC CATHETERIZATION N/A 07/27/2016   Procedure: Left Heart Cath and Coronary Angiography;  Surgeon: Tonny Bollman, MD;  Location[facility] INVASIVE CV LAB;  Service: Cardiovascular;  Laterality: N/A;   CARDIOVERSION N/A 06/29/2016   Procedure: CARDIOVERSION;  Surgeon: Pricilla Riffle, MD;  Location: [facility] ENDOSCOPY;  Service: Cardiovascular;  Laterality: N/A;   CARDIOVERSION N/A 03/05/2018   Procedure: CARDIOVERSION;  Surgeon: Laurey Morale, MD;  Location: [facility] ENDOSCOPY;  Service: Cardiovascular;  Laterality: N/A;   CHOLECYSTECTOMY     ENDARTERECTOMY Right 12/06/2017   Procedure: ENDARTERECTOMY CAROTID RIGHT;  Surgeon: Nada Libman, MD;  Location: [facility] OR;  Service: Vascular;  Laterality: Right;   KNEE SURGERY  1982, 1988, 1997, 2000   x 4 times.   LOWER EXTREMITY ANGIOGRAPHY N/A 07/03/2019   Procedure: LOWER EXTREMITY ANGIOGRAPHY;  Surgeon: Sherren Kerns, MD;  Location: [facility] INVASIVE CV LAB;  Service: Cardiovascular;  Laterality: N/A;   LUMBAR LAMINECTOMY/DECOMPRESSION MICRODISCECTOMY Bilateral 09/09/2018   Procedure: Laminectomy and Foraminotomy bilateral Lumbar three-Lumbar four - Lumbar four-Lumbar five;  Surgeon: Julio Sicks, MD;  Location: [facility] OR;  Service:  Neurosurgery;  Laterality: Bilateral;   PATCH ANGIOPLASTY Right 12/06/2017   Procedure: PATCH ANGIOPLASTY RIGHT CAROTID ARTERY;  Surgeon: Nada Libman, MD;  Location: [facility] OR;  Service: Vascular;  Laterality: Right;   PERIPHERAL VASCULAR INTERVENTION Right 01/25/2017   Procedure: Peripheral Vascular Intervention;  Surgeon: Sherren Kerns, MD;  Location: [facility] INVASIVE CV LAB;  Service: Cardiovascular;  Laterality: Right;   PERIPHERAL VASCULAR INTERVENTION Right 07/03/2019   Procedure: PERIPHERAL VASCULAR INTERVENTION;  Surgeon: Sherren Kerns, MD;  Location: [facility] INVASIVE CV LAB;  Service: Cardiovascular;  Laterality: Right;   TONSILLECTOMY      SOCIAL HISTORY: Social History   Socioeconomic History   Marital status: Married    Spouse name: Not on file   Number of children: 2   Years of education: Not on file   Highest education level: Not on file  Occupational History   Occupation: retired  Tobacco Use   Smoking status: Every Day    Current packs/day: 0.00    Average packs/day: 1 pack/day for 50.0 years (50.0 ttl pk-yrs)    Types: Cigarettes    Start date: 06/02/1971  Last attempt to quit: 12/09/2017    Years since quitting: 5.3   Smokeless tobacco: Never   Tobacco comments:    pt vapes  Vaping Use   Vaping status: Some Days   Substances: Nicotine  Substance and Sexual Activity   Alcohol use: Yes    Alcohol/week: 0.0 standard drinks of alcohol    Comment: occasionaly   Drug use: No   Sexual activity: Not on file  Other Topics Concern   Not on file  Social History Narrative   He is originally from [place]. He moved to [facility] in 1965. Previously has traveled to most of the [place]. Has traveled to [place] & [place]. Has been to [place], [place], & [place] 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 Has a dog currently. Remote bird exposure in 1980. No mold exposure.    Social Determinants of Health   Financial Resource Strain: Not on file  Food Insecurity: Not on file   Transportation Needs: Not on file  Physical Activity: Not on file  Stress: Not on file  Social Connections: Not on file  Intimate Partner Violence: Not on file    FAMILY HISTORY: Family History  Problem Relation Age of Onset   Heart disease Father    Melanoma Father    Testicular cancer Father    Hypertension Mother    Arthritis Mother    Arthritis Sister    Lung disease Neg Hx     ALLERGIES:  is allergic to augmentin [amoxicillin-pot clavulanate].  MEDICATIONS:  Current Outpatient Medications  Medication Sig Dispense Refill   amLODipine (NORVASC) 5 MG tablet Take 5 mg by mouth daily.     apixaban (ELIQUIS) 5 MG TABS tablet Take 1 tablet (5 mg total) by mouth 2 (two) times daily. Please keep upcoming appointment. Thanks. 60 tablet 1   aspirin EC 81 MG tablet Take 81 mg by mouth daily.     Carboxymethylcellul-Glycerin (REFRESH OPTIVE OP) Place 1 drop into both eyes every morning.      diazepam (VALIUM) 2 MG tablet Take 2 mg by mouth 2 (two) times daily as needed.     fenofibrate (TRICOR) 48 MG tablet Take 48 mg by mouth daily.     gabapentin (NEURONTIN) 300 MG capsule Take 3 capsules by mouth 2 (two) times daily.     glipiZIDE (GLUCOTROL XL) 5 MG 24 hr tablet Take 5 mg by mouth daily with breakfast.     HYDROcodone-acetaminophen (NORCO/VICODIN) 5-325 MG tablet Take 1-2 tablets by mouth every 6 (six) hours as needed (for pain). 8 tablet 0   JARDIANCE 25 MG TABS tablet Take 25 mg by mouth daily.     metFORMIN (GLUCOPHAGE-XR) 500 MG 24 hr tablet Take 1,000 mg by mouth 2 (two) times daily.     ondansetron (ZOFRAN) 4 MG tablet Take 4 mg by mouth every 8 (eight) hours as needed for nausea or vomiting.     rosuvastatin (CRESTOR) 20 MG tablet Take 1 tablet (20 mg total) by mouth daily. 90 tablet 3   sildenafil (VIAGRA) 100 MG tablet Take by mouth.     spironolactone (ALDACTONE) 25 MG tablet Take 25 mg by mouth daily.     valsartan (DIOVAN) 320 MG tablet Take 320 mg by mouth daily.      No current facility-administered medications for this visit.    PHYSICAL EXAMINATION: ECOG PERFORMANCE STATUS: 1 - Symptomatic but completely ambulatory  Vitals:   04/23/23 0913  BP: (!) 178/88  Pulse: 86  Resp: 18  Temp: (!) 97.5 F (36.4 C)  SpO2: 97%     GENERAL:alert, no distress and comfortable SKIN: skin color normal, no rashes or significant lesions EYES: normal, Conjunctiva are pink and non-injected, sclera clear  NEURO: alert & oriented x 3 with fluent speech NECK: (-)supple, thyroid normal size, non-tender, without nodularity LYMPH:  (-)no palpable lymphadenopathy in the cervical, axillary  LUNGS: (-)clear to auscultation and percussion with normal breathing effort HEART: (-)regular rate & rhythm and no murmurs and(-)no lower extremity edema ABDOMEN:(-)abdomen soft, (+)tender and normal bowel sounds LABORATORY DATA:  I have reviewed the data as listed    Latest Ref Rng & Units 07/03/2019    6:46 AM 09/03/2018    3:27 PM 03/04/2018    6:04 AM  CBC  WBC 4.0 - 10.5 K/uL  8.1  6.9   Hemoglobin 13.0 - 17.0 g/dL 86.5  78.4  69.6   Hematocrit 39.0 - 52.0 % 42.0  48.5  47.1   Platelets 150 - 400 K/uL  321  286        Latest Ref Rng & Units 02/05/2022   11:30 AM 04/26/2021   10:37 AM 01/17/2021    2:02 PM  CMP  Glucose 65 - 99 mg/dL   295   BUN 8 - 27 mg/dL   25   Creatinine 2.84 - 1.27 mg/dL   1.32   Sodium 440 - 102 mmol/L   140   Potassium 3.5 - 5.2 mmol/L   4.3   Chloride 96 - 106 mmol/L   103   CO2 20 - 29 mmol/L   23   Calcium 8.6 - 10.2 mg/dL   9.6   ALT 0 - 44 IU/L 15  11  16       RADIOGRAPHIC STUDIES: I have personally reviewed the radiological images as listed and agreed with the findings in the report. CT ABDOMEN PELVIS W CONTRAST  Result Date: 04/18/2023 CLINICAL DATA:  Right lower quadrant pain 4 months. Chronic nausea. Unintentional 45 lb weight loss EXAM: CT ABDOMEN AND PELVIS WITH CONTRAST TECHNIQUE: Multidetector CT imaging of the abdomen  and pelvis was performed using the standard protocol following bolus administration of intravenous contrast. RADIATION DOSE REDUCTION: This exam was performed according to the departmental dose-optimization program which includes automated exposure control, adjustment of the mA and/or kV according to patient size and/or use of iterative reconstruction technique. CONTRAST:  80mL ISOVUE-370 IOPAMIDOL (ISOVUE-370) INJECTION 76% COMPARISON:  None Available. FINDINGS: Lower Chest: No acute findings. Hepatobiliary: No suspicious hepatic masses identified. Prior cholecystectomy. Mild diffuse biliary ductal dilatation is seen with common bile duct measuring up to 12 mm. This may be related to post cholecystectomy state. Pancreas: An ill-defined low-attenuation mass is seen in the pancreatic tail measuring 4.4 x 3.5 cm on image 23/2, highly suspicious for pancreatic carcinoma. This mass encases the proximal splenic artery and causes splenic vein thrombosis. Spleen: Within normal limits in size and appearance. Adrenals/Urinary Tract: No suspicious masses identified. Mild left renal parenchymal scarring noted. No evidence of ureteral calculi or hydronephrosis. Stomach/Bowel: No evidence of obstruction, inflammatory process or abnormal fluid collections. Vascular/Lymphatic: No pathologically enlarged lymph nodes. No acute vascular findings. Reproductive:  No mass or other significant abnormality. Other:  None. Musculoskeletal:  No suspicious bone lesions identified. IMPRESSION: 4.4 cm ill-defined mass in the pancreatic tail, highly suspicious for pancreatic carcinoma. No evidence of abdominal or pelvic metastatic disease. Mild diffuse biliary ductal dilatation, which may be due to post cholecystectomy state. Recommend correlation with liver  function tests, and consider abdomen MRI/MRCP without and with contrast for further evaluation. These results will be called to the ordering clinician or representative by the Radiologist  Assistant, and communication documented in the PACS or [employer]. Electronically Signed   By: Danae Orleans M.D.   On: 04/18/2023 07:48     Orders Placed This Encounter  Procedures   Cancer antigen 19-9    Standing Status:   Standing    Number of Occurrences:   20    Standing Expiration Date:   04/22/2024    All questions were answered. The patient knows to call the clinic with any problems, questions or concerns. The total time spent in the appointment was 50 minutes.     [name], MD 04/23/2023   Carolin Coy am acting as scribe for [name], MD.   I have reviewed the above documentation for accuracy and completeness, and I agree with the above.

## 2023-04-23 NOTE — Progress Notes (Signed)
[facility]   [phone] Fax:([phone]) [phone]   Clinic New Consult Note   Patient Care Team: [name], MD as PCP - General [name], [name], MD as PCP - Cardiology (Cardiology) [name], MD as Consulting Physician (Hematology)  Date of Service:  04/23/2023   CHIEF COMPLAINTS/PURPOSE OF CONSULTATION:  Pancreatic mass   REFERRING PHYSICIAN:  [name], MD   ASSESSMENT & PLAN:  Scott [name] is a 72 y.o.  male with a history of   1. Pancreatic mass  -Patient presented with abdominal pain and weight loss, CT scan revealed a 4.4 x 3.5 cm ill-defined mass in the pancreatic tail, which 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  This is highly suspicious for pancreatic cancer. -I personally reviewed his CT scan images with patient and his wife, and I recommend EUS for biopsy.  I will reach out to Dr. Dulce Sellar -I will check tumor marker CA 19.9 today.  His recent CBC and CMP were unremarkable. -I will also refer him to pancreatobiliary surgeon [name] or Dr. Freida Busman for surgical evaluation -Will review his case in our GI tumor board -If his biopsy confirms adenocarcinoma, then we will decide if he needs neoadjuvant chemotherapy, versus upfront surgery.  I discussed that his pancreatic cancer is likely resectable, but he may need splenectomy at same time.  If he undergoes surgical resection first, I will recommend adjuvant chemotherapy if he recovers well. -Follow-up after biopsy.  Abdominal pain -He is on Norco for his chronic back pain after his surgery, this is managed by his primary care physician Dr. Tiburcio Pea -He was discussed with Dr. Tiburcio Pea to escalating his narcotics due to his abdominal pain.  Especially he has an upcoming trip in 2 weeks.    PLAN:  -recommend  EUS Endoscopy biopsy -I will reach out to Dr. Dulce Sellar for biopsy -I referred to Dr. Freida Busman or Dr. Wayne Both -recommend eating small meals -f/u after  biopsy    HISTORY OF PRESENTING ILLNESS:  Scott [name] 72 y.o. male is a here because of Pancreatic mass . The patient was referred by [name], MD . The patient presents to the clinic today accompanied by wife.    Pt had some weight loss about 20-25 lbs in past 6 months, medication weight has been slightly fluctuating it is diet and exercise.  He developed abdominal pain a few months ago, he described as a dull pain across his upper and mid abdomen, the pain is constant but does fluctuate.  He has had chronic back pain since his back surgery in 2019, and has been on Norco since then which is managed by his primary care physician.  He feels that Norco does not help her abdominal pain.  He denies any other GI symptoms, no nausea, vomiting, or change of bowel habits.  His energy level is overall still good, he functions very well.  He was seen by his PCP Dr. Tiburcio Pea for his abdominal pain, a CT of abdomen pelvis with contrast was obtained on April 11, 2023, which showed a 4.4 cm ill-defined mass in the pancreatic tail, highly suspicious for malignancy.  He was referred to [place] for further evaluation.   Socially... Married Heavy smoker  REVIEW OF SYSTEMS:    Constitutional: (-)Denies fevers, chills or abnormal night sweats Eyes: (-)Denies blurriness of vision, double vision or watery eyes Ears, nose, mouth, throat, and face: (-)Denies mucositis or sore throat Respiratory: (-)Denies cough, dyspnea  or wheezes Cardiovascular: (-) Denies palpitation, chest discomfort or lower extremity swelling Gastrointestinal:  (-)Denies nausea, heartburn or change in bowel habits Skin: (-) Denies abnormal skin rashes Lymphatics: (-)Denies new lymphadenopathy or easy bruising Neurological:(-) Denies numbness, tingling or new weaknesses Behavioral/Psych: (-) Mood is stable, no new changes  All other systems were reviewed with the patient and are negative.   MEDICAL HISTORY:  Past Medical History:   Diagnosis Date   CAD (coronary artery disease)    Cerebrovascular disease    HLD (hyperlipidemia)    HTN (hypertension)    IBS (irritable bowel syndrome)    Lumbar stenosis    MI, old    Persistent atrial fibrillation (HCC)    PONV (postoperative nausea and vomiting)    Pre-diabetes    PVD (peripheral vascular disease) (HCC)    Stroke (cerebrum) (HCC)    Tobacco abuse     SURGICAL HISTORY: Past Surgical History:  Procedure Laterality Date   ABDOMINAL AORTOGRAM W/LOWER EXTREMITY N/A 01/25/2017   Procedure: Abdominal Aortogram w/Lower Extremity;  Surgeon: Sherren Kerns, MD;  Location: [facility] INVASIVE CV LAB;  Service: Cardiovascular;  Laterality: N/A;   ANTERIOR CRUCIATE LIGAMENT REPAIR     CARDIAC CATHETERIZATION     CONE   CARDIAC CATHETERIZATION N/A 07/27/2016   Procedure: Left Heart Cath and Coronary Angiography;  Surgeon: Tonny Bollman, MD;  Location: [facility] INVASIVE CV LAB;  Service: Cardiovascular;  Laterality: N/A;   CARDIOVERSION N/A 06/29/2016   Procedure: CARDIOVERSION;  Surgeon: Pricilla Riffle, MD;  Location: [facility] ENDOSCOPY;  Service: Cardiovascular;  Laterality: N/A;   CARDIOVERSION N/A 03/05/2018   Procedure: CARDIOVERSION;  Surgeon: Laurey Morale, MD;  Location: [facility] ENDOSCOPY;  Service: Cardiovascular;  Laterality: N/A;   CHOLECYSTECTOMY     ENDARTERECTOMY Right 12/06/2017   Procedure: ENDARTERECTOMY CAROTID RIGHT;  Surgeon: Nada Libman, MD;  Location: [facility] OR;  Service: Vascular;  Laterality: Right;   KNEE SURGERY  1982, 1988, 1997, 2000   x 4 times.   LOWER EXTREMITY ANGIOGRAPHY N/A 07/03/2019   Procedure: LOWER EXTREMITY ANGIOGRAPHY;  Surgeon: Sherren Kerns, MD;  Location: [facility] INVASIVE CV LAB;  Service: Cardiovascular;  Laterality: N/A;   LUMBAR LAMINECTOMY/DECOMPRESSION MICRODISCECTOMY Bilateral 09/09/2018   Procedure: Laminectomy and Foraminotomy bilateral Lumbar three-Lumbar four - Lumbar four-Lumbar five;  Surgeon: Julio Sicks, MD;  Location: [facility] OR;  Service:  Neurosurgery;  Laterality: Bilateral;   PATCH ANGIOPLASTY Right 12/06/2017   Procedure: PATCH ANGIOPLASTY RIGHT CAROTID ARTERY;  Surgeon: Nada Libman, MD;  Location: [facility] OR;  Service: Vascular;  Laterality: Right;   PERIPHERAL VASCULAR INTERVENTION Right 01/25/2017   Procedure: Peripheral Vascular Intervention;  Surgeon: Sherren Kerns, MD;  Location: [facility] INVASIVE CV LAB;  Service: Cardiovascular;  Laterality: Right;   PERIPHERAL VASCULAR INTERVENTION Right 07/03/2019   Procedure: PERIPHERAL VASCULAR INTERVENTION;  Surgeon: Sherren Kerns, MD;  Location: [facility] INVASIVE CV LAB;  Service: Cardiovascular;  Laterality: Right;   TONSILLECTOMY      SOCIAL HISTORY: Social History   Socioeconomic History   Marital status: Married    Spouse name: Not on file   Number of children: 2   Years of education: Not on file   Highest education level: Not on file  Occupational History   Occupation: retired  Tobacco Use   Smoking status: Every Day    Current packs/day: 0.00    Average packs/day: 1 pack/day for 50.0 years (50.0 ttl pk-yrs)    Types: Cigarettes    Start date: 06/02/1971  Last attempt to quit: 12/09/2017    Years since quitting: 5.3   Smokeless tobacco: Never   Tobacco comments:    pt vapes  Vaping Use   Vaping status: Some Days   Substances: Nicotine  Substance and Sexual Activity   Alcohol use: Yes    Alcohol/week: 0.0 standard drinks of alcohol    Comment: occasionaly   Drug use: No   Sexual activity: Not on file  Other Topics Concern   Not on file  Social History Narrative   He is originally from [place]. He moved to [facility] in 1965. Previously has traveled to most of the [place]. Has traveled to [place] & [place]. Has been to [place], [place], & [place] 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 Has a dog currently. Remote bird exposure in 1980. No mold exposure.    Social Determinants of Health   Financial Resource Strain: Not on file  Food Insecurity: Not on file   Transportation Needs: Not on file  Physical Activity: Not on file  Stress: Not on file  Social Connections: Not on file  Intimate Partner Violence: Not on file    FAMILY HISTORY: Family History  Problem Relation Age of Onset   Heart disease Father    Melanoma Father    Testicular cancer Father    Hypertension Mother    Arthritis Mother    Arthritis Sister    Lung disease Neg Hx     ALLERGIES:  is allergic to augmentin [amoxicillin-pot clavulanate].  MEDICATIONS:  Current Outpatient Medications  Medication Sig Dispense Refill   amLODipine (NORVASC) 5 MG tablet Take 5 mg by mouth daily.     apixaban (ELIQUIS) 5 MG TABS tablet Take 1 tablet (5 mg total) by mouth 2 (two) times daily. Please keep upcoming appointment. Thanks. 60 tablet 1   aspirin EC 81 MG tablet Take 81 mg by mouth daily.     Carboxymethylcellul-Glycerin (REFRESH OPTIVE OP) Place 1 drop into both eyes every morning.      diazepam (VALIUM) 2 MG tablet Take 2 mg by mouth 2 (two) times daily as needed.     fenofibrate (TRICOR) 48 MG tablet Take 48 mg by mouth daily.     gabapentin (NEURONTIN) 300 MG capsule Take 3 capsules by mouth 2 (two) times daily.     glipiZIDE (GLUCOTROL XL) 5 MG 24 hr tablet Take 5 mg by mouth daily with breakfast.     HYDROcodone-acetaminophen (NORCO/VICODIN) 5-325 MG tablet Take 1-2 tablets by mouth every 6 (six) hours as needed (for pain). 8 tablet 0   JARDIANCE 25 MG TABS tablet Take 25 mg by mouth daily.     metFORMIN (GLUCOPHAGE-XR) 500 MG 24 hr tablet Take 1,000 mg by mouth 2 (two) times daily.     ondansetron (ZOFRAN) 4 MG tablet Take 4 mg by mouth every 8 (eight) hours as needed for nausea or vomiting.     rosuvastatin (CRESTOR) 20 MG tablet Take 1 tablet (20 mg total) by mouth daily. 90 tablet 3   sildenafil (VIAGRA) 100 MG tablet Take by mouth.     spironolactone (ALDACTONE) 25 MG tablet Take 25 mg by mouth daily.     valsartan (DIOVAN) 320 MG tablet Take 320 mg by mouth daily.      No current facility-administered medications for this visit.    PHYSICAL EXAMINATION: ECOG PERFORMANCE STATUS: 1 - Symptomatic but completely ambulatory  Vitals:   04/23/23 0913  BP: (!) 178/88  Pulse: 86  Resp: 18  Temp: (!) 97.5 F (36.4 C)  SpO2: 97%     GENERAL:alert, no distress and comfortable SKIN: skin color normal, no rashes or significant lesions EYES: normal, Conjunctiva are pink and non-injected, sclera clear  NEURO: alert & oriented x 3 with fluent speech NECK: (-)supple, thyroid normal size, non-tender, without nodularity LYMPH:  (-)no palpable lymphadenopathy in the cervical, axillary  LUNGS: (-)clear to auscultation and percussion with normal breathing effort HEART: (-)regular rate & rhythm and no murmurs and(-)no lower extremity edema ABDOMEN:(-)abdomen soft, (+)tender and normal bowel sounds LABORATORY DATA:  I have reviewed the data as listed    Latest Ref Rng & Units 07/03/2019    6:46 AM 09/03/2018    3:27 PM 03/04/2018    6:04 AM  CBC  WBC 4.0 - 10.5 K/uL  8.1  6.9   Hemoglobin 13.0 - 17.0 g/dL 86.5  78.4  69.6   Hematocrit 39.0 - 52.0 % 42.0  48.5  47.1   Platelets 150 - 400 K/uL  321  286        Latest Ref Rng & Units 02/05/2022   11:30 AM 04/26/2021   10:37 AM 01/17/2021    2:02 PM  CMP  Glucose 65 - 99 mg/dL   295   BUN 8 - 27 mg/dL   25   Creatinine 2.84 - 1.27 mg/dL   1.32   Sodium 440 - 102 mmol/L   140   Potassium 3.5 - 5.2 mmol/L   4.3   Chloride 96 - 106 mmol/L   103   CO2 20 - 29 mmol/L   23   Calcium 8.6 - 10.2 mg/dL   9.6   ALT 0 - 44 IU/L 15  11  16       RADIOGRAPHIC STUDIES: I have personally reviewed the radiological images as listed and agreed with the findings in the report. CT ABDOMEN PELVIS W CONTRAST  Result Date: 04/18/2023 CLINICAL DATA:  Right lower quadrant pain 4 months. Chronic nausea. Unintentional 45 lb weight loss EXAM: CT ABDOMEN AND PELVIS WITH CONTRAST TECHNIQUE: Multidetector CT imaging of the abdomen  and pelvis was performed using the standard protocol following bolus administration of intravenous contrast. RADIATION DOSE REDUCTION: This exam was performed according to the departmental dose-optimization program which includes automated exposure control, adjustment of the mA and/or kV according to patient size and/or use of iterative reconstruction technique. CONTRAST:  80mL ISOVUE-370 IOPAMIDOL (ISOVUE-370) INJECTION 76% COMPARISON:  None Available. FINDINGS: Lower Chest: No acute findings. Hepatobiliary: No suspicious hepatic masses identified. Prior cholecystectomy. Mild diffuse biliary ductal dilatation is seen with common bile duct measuring up to 12 mm. This may be related to post cholecystectomy state. Pancreas: An ill-defined low-attenuation mass is seen in the pancreatic tail measuring 4.4 x 3.5 cm on image 23/2, highly suspicious for pancreatic carcinoma. This mass encases the proximal splenic artery and causes splenic vein thrombosis. Spleen: Within normal limits in size and appearance. Adrenals/Urinary Tract: No suspicious masses identified. Mild left renal parenchymal scarring noted. No evidence of ureteral calculi or hydronephrosis. Stomach/Bowel: No evidence of obstruction, inflammatory process or abnormal fluid collections. Vascular/Lymphatic: No pathologically enlarged lymph nodes. No acute vascular findings. Reproductive:  No mass or other significant abnormality. Other:  None. Musculoskeletal:  No suspicious bone lesions identified. IMPRESSION: 4.4 cm ill-defined mass in the pancreatic tail, highly suspicious for pancreatic carcinoma. No evidence of abdominal or pelvic metastatic disease. Mild diffuse biliary ductal dilatation, which may be due to post cholecystectomy state. Recommend correlation with liver  function tests, and consider abdomen MRI/MRCP without and with contrast for further evaluation. These results will be called to the ordering clinician or representative by the Radiologist  Assistant, and communication documented in the PACS or [employer]. Electronically Signed   By: Danae Orleans M.D.   On: 04/18/2023 07:48     Orders Placed This Encounter  Procedures   Cancer antigen 19-9    Standing Status:   Standing    Number of Occurrences:   20    Standing Expiration Date:   04/22/2024    All questions were answered. The patient knows to call the clinic with any problems, questions or concerns. The total time spent in the appointment was 50 minutes.     [name], MD 04/23/2023   Carolin Coy am acting as scribe for [name], MD.   I have reviewed the above documentation for accuracy and completeness, and I agree with the above.

## 2023-04-24 ENCOUNTER — Inpatient Hospital Stay: Admission: RE | Admit: 2023-04-24 | Payer: Medicare Other | Source: Ambulatory Visit

## 2023-04-24 NOTE — Progress Notes (Signed)
I faxed dr Latanya Maudlin consult note to referring physician, Dr Tiburcio Pea at [facility]

## 2023-04-25 ENCOUNTER — Telehealth: Payer: Self-pay | Admitting: *Deleted

## 2023-04-25 NOTE — Telephone Encounter (Signed)
   Pre-operative Risk Assessment    Patient Name: Scott [name]  DOB: [birth year] MRN: [id number]      Request for Surgical Clearance    Procedure:   EUS  Date of Surgery:  Clearance TBD                                 Surgeon:  DR. Dulce Sellar Surgeon's Group or Practice Name:  [facility] Phone number:  [phone] Fax number:  [phone]   Type of Clearance Requested:   - Pharmacy:  Hold Apixaban (Eliquis) NOT INDICATED HOW LONG    Type of Anesthesia:   PROPOFOL   Additional requests/questions:    Scott [name]   04/25/2023, 8:00 AM

## 2023-04-25 NOTE — Telephone Encounter (Signed)
Patient with diagnosis of afib on Eliquis for anticoagulation.    Procedure: EUS Date of procedure: TBD   CHA2DS2-VASc Score = 5   This indicates a 7.2% annual risk of stroke. [name] score is based upon: CHF History: 0 HTN History: 1 Diabetes History: 0 Stroke History: 2 Vascular Disease History: 1 Age Score: 1 Gender Score: 0      CrCl 73 ml/min  Per office protocol, patient can hold Eliquis for 2 days prior to procedure.    **This guidance is not considered finalized until pre-operative APP has relayed final recommendations.**

## 2023-04-25 NOTE — Telephone Encounter (Signed)
Dr. Elease Hashimoto,   You saw this patient on 03/12/2023. Will you please comment on medical clearance for endoscopic [place]?  Please route your response to P CV DIV Preop. I will communicate with requesting office once you have given recommendations.   Thank you!  Carlos Levering, NP

## 2023-04-25 NOTE — Telephone Encounter (Signed)
Please advise holding Eliquis prior to endoscopy [place].   Thank you!  DW

## 2023-04-26 ENCOUNTER — Other Ambulatory Visit: Payer: Self-pay

## 2023-04-26 ENCOUNTER — Other Ambulatory Visit: Payer: Self-pay | Admitting: Gastroenterology

## 2023-04-26 DIAGNOSIS — K8689 Other specified diseases of pancreas: Secondary | ICD-10-CM

## 2023-04-26 NOTE — Progress Notes (Signed)
I spoke with Scott [name].  He is able to come for follow up appt with Dr Mosetta Putt on 8/23 at 0900, lab at 0830.  He spoke with his PCP, Dr Tiburcio Pea regarding pain management as he has chronic back pain.  He had significant pain last night.  He does relate that he ate a large meal in the evening.  I encouraged him to eat small frequent meals throughout the day.  I did tell him we have palliative care NP who does pain management.  He was interested in that when he returns from vacation.  All questions were answered.  He verbalized understanding.

## 2023-04-26 NOTE — Telephone Encounter (Signed)
   Name: Scott [name]  DOB: June 16, 1951  MRN: [id number]   Primary Cardiologist: Kristeen Miss, MD  Chart reviewed as part of pre-operative protocol coverage. Scott [name] was last seen on 03/12/2023 by Dr. Elease Hashimoto.  Per Dr. Elease Hashimoto "He is at low risk for his upcoming EUS and possible biopsy from a CV standpoint."  Therefore, based on ACC/AHA guidelines, the patient would be at acceptable risk for the planned procedure without further cardiovascular testing.   Per pharm D, patient may hold Eliquis for 2 days prior to procedure.   I will route this recommendation to the requesting party via Epic fax function and remove from pre-op pool. Please call with questions.  Carlos Levering, NP 04/26/2023, 3:24 PM

## 2023-04-29 ENCOUNTER — Encounter (HOSPITAL_COMMUNITY): Payer: Self-pay | Admitting: Gastroenterology

## 2023-04-29 NOTE — Progress Notes (Unsigned)
Mr holstad is aware of telephone visit with Dr Mosetta Putt 8/9 at 0900/

## 2023-04-30 NOTE — Anesthesia Preprocedure Evaluation (Signed)
Anesthesia Evaluation  Patient identified by MRN, date of birth, ID band Patient awake    Reviewed: Allergy & Precautions, H&P , NPO status , Patient's Chart, lab work & pertinent test results  History of Anesthesia Complications (+) PONV and history of anesthetic complications  Airway Mallampati: III  TM Distance: >3 FB Neck ROM: Full    Dental no notable dental hx. (+) Teeth Intact, Dental Advisory Given   Pulmonary neg pulmonary ROS, Current Smoker   Pulmonary exam normal breath sounds clear to auscultation       Cardiovascular Exercise Tolerance: Good hypertension, Pt. on medications + CAD, + Past MI, + Cardiac Stents and + Peripheral Vascular Disease  + dysrhythmias Atrial Fibrillation  Rhythm:Regular Rate:Normal     Neuro/Psych TIA negative psych ROS   GI/Hepatic negative GI ROS, Neg liver ROS,,,  Endo/Other  diabetes, Type 2, Oral Hypoglycemic Agents    Renal/GU negative Renal ROS  negative genitourinary   Musculoskeletal  (+) Arthritis , Osteoarthritis,    Abdominal   Peds  Hematology negative hematology ROS (+)   Anesthesia Other Findings   Reproductive/Obstetrics negative OB ROS                             Anesthesia Physical Anesthesia Plan  ASA: 3  Anesthesia Plan: MAC   Post-op Pain Management: Minimal or no pain anticipated   Induction: Intravenous  PONV Risk Score and Plan: 1 and Propofol infusion  Airway Management Planned: Natural Airway and Simple Face Mask  Additional Equipment:   Intra-op Plan:   Post-operative Plan:   Informed Consent: I have reviewed the patients History and Physical, chart, labs and discussed the procedure including the risks, benefits and alternatives for the proposed anesthesia with the patient or authorized representative who has indicated his/her understanding and acceptance.     Dental advisory given  Plan Discussed with:  CRNA  Anesthesia Plan Comments:        Anesthesia Quick Evaluation

## 2023-05-01 ENCOUNTER — Ambulatory Visit (HOSPITAL_COMMUNITY)
Admission: RE | Admit: 2023-05-01 | Discharge: 2023-05-01 | Disposition: A | Discharge status: Home / Self Care | Payer: Medicare Other | Attending: Gastroenterology | Admitting: Gastroenterology

## 2023-05-01 ENCOUNTER — Encounter (HOSPITAL_COMMUNITY)
Admission: RE | Disposition: A | Discharge status: Home / Self Care | Payer: Self-pay | Source: Home / Self Care | Attending: Gastroenterology

## 2023-05-01 ENCOUNTER — Encounter (HOSPITAL_COMMUNITY): Payer: Self-pay | Admitting: Gastroenterology

## 2023-05-01 ENCOUNTER — Ambulatory Visit (HOSPITAL_COMMUNITY): Payer: Medicare Other | Admitting: Anesthesiology

## 2023-05-01 ENCOUNTER — Other Ambulatory Visit: Payer: Self-pay

## 2023-05-01 ENCOUNTER — Ambulatory Visit (HOSPITAL_BASED_OUTPATIENT_CLINIC_OR_DEPARTMENT_OTHER): Payer: Medicare Other | Admitting: Anesthesiology

## 2023-05-01 DIAGNOSIS — I252 Old myocardial infarction: Secondary | ICD-10-CM | POA: Insufficient documentation

## 2023-05-01 DIAGNOSIS — K8689 Other specified diseases of pancreas: Secondary | ICD-10-CM | POA: Diagnosis not present

## 2023-05-01 DIAGNOSIS — I4819 Other persistent atrial fibrillation: Secondary | ICD-10-CM | POA: Diagnosis not present

## 2023-05-01 DIAGNOSIS — Z955 Presence of coronary angioplasty implant and graft: Secondary | ICD-10-CM | POA: Insufficient documentation

## 2023-05-01 DIAGNOSIS — Z7901 Long term (current) use of anticoagulants: Secondary | ICD-10-CM | POA: Insufficient documentation

## 2023-05-01 DIAGNOSIS — F17210 Nicotine dependence, cigarettes, uncomplicated: Secondary | ICD-10-CM | POA: Diagnosis not present

## 2023-05-01 DIAGNOSIS — E1151 Type 2 diabetes mellitus with diabetic peripheral angiopathy without gangrene: Secondary | ICD-10-CM | POA: Diagnosis not present

## 2023-05-01 DIAGNOSIS — Z7984 Long term (current) use of oral hypoglycemic drugs: Secondary | ICD-10-CM | POA: Insufficient documentation

## 2023-05-01 DIAGNOSIS — I2510 Atherosclerotic heart disease of native coronary artery without angina pectoris: Secondary | ICD-10-CM | POA: Diagnosis not present

## 2023-05-01 DIAGNOSIS — R933 Abnormal findings on diagnostic imaging of other parts of digestive tract: Secondary | ICD-10-CM | POA: Diagnosis not present

## 2023-05-01 DIAGNOSIS — I4891 Unspecified atrial fibrillation: Secondary | ICD-10-CM

## 2023-05-01 DIAGNOSIS — I10 Essential (primary) hypertension: Secondary | ICD-10-CM | POA: Insufficient documentation

## 2023-05-01 DIAGNOSIS — C251 Malignant neoplasm of body of pancreas: Secondary | ICD-10-CM | POA: Diagnosis not present

## 2023-05-01 HISTORY — PX: ESOPHAGOGASTRODUODENOSCOPY (EGD) WITH PROPOFOL: SHX5813

## 2023-05-01 HISTORY — PX: UPPER ESOPHAGEAL ENDOSCOPIC ULTRASOUND (EUS): SHX6562

## 2023-05-01 HISTORY — PX: FINE NEEDLE ASPIRATION: SHX5430

## 2023-05-01 LAB — CBC
HCT: 43.5 % (ref 39.0–52.0)
Hemoglobin: 14.1 g/dL (ref 13.0–17.0)
MCH: 28.6 pg (ref 26.0–34.0)
MCHC: 32.4 g/dL (ref 30.0–36.0)
MCV: 88.2 fL (ref 80.0–100.0)
Platelets: 207 10*3/uL (ref 150–400)
RBC: 4.93 MIL/uL (ref 4.22–5.81)
RDW: 14 % (ref 11.5–15.5)
WBC: 8.5 10*3/uL (ref 4.0–10.5)
nRBC: 0 % (ref 0.0–0.2)

## 2023-05-01 LAB — COMPREHENSIVE METABOLIC PANEL
ALT: 156 U/L — ABNORMAL HIGH (ref 0–44)
AST: 144 U/L — ABNORMAL HIGH (ref 15–41)
Albumin: 3.5 g/dL (ref 3.5–5.0)
Alkaline Phosphatase: 95 U/L (ref 38–126)
Anion gap: 7 (ref 5–15)
BUN: 18 mg/dL (ref 8–23)
CO2: 23 mmol/L (ref 22–32)
Calcium: 8.6 mg/dL — ABNORMAL LOW (ref 8.9–10.3)
Chloride: 107 mmol/L (ref 98–111)
Creatinine, Ser: 0.68 mg/dL (ref 0.61–1.24)
GFR, Estimated: >60 mL/min (ref >60)
Glucose, Bld: 94 mg/dL (ref 70–99)
Potassium: 3.8 mmol/L (ref 3.5–5.1)
Sodium: 137 mmol/L (ref 135–145)
Total Bilirubin: 0.5 mg/dL (ref 0.3–1.2)
Total Protein: 6.2 g/dL — ABNORMAL LOW (ref 6.5–8.1)

## 2023-05-01 LAB — GLUCOSE, CAPILLARY: Glucose-Capillary: 101 mg/dL — ABNORMAL HIGH (ref 70–99)

## 2023-05-01 SURGERY — UPPER ESOPHAGEAL ENDOSCOPIC ULTRASOUND (EUS)
Anesthesia: Monitor Anesthesia Care

## 2023-05-01 MED ORDER — APIXABAN 5 MG PO TABS
5.0000 mg | ORAL_TABLET | Freq: Two times a day (BID) | ORAL | 1 refills | Status: DC
Start: 2023-05-02 — End: 2023-05-21

## 2023-05-01 MED ORDER — FENTANYL CITRATE (PF) 100 MCG/2ML IJ SOLN
25.0000 ug | Freq: Once | INTRAMUSCULAR | Status: AC
  Administered 2023-05-01: 25 ug via INTRAVENOUS

## 2023-05-01 MED ORDER — FENTANYL CITRATE (PF) 100 MCG/2ML IJ SOLN
INTRAMUSCULAR | Status: AC
  Filled 2023-05-01: qty 2

## 2023-05-01 MED ORDER — DEXMEDETOMIDINE HCL IN NACL 80 MCG/20ML IV SOLN
INTRAVENOUS | Status: DC | PRN
  Administered 2023-05-01: 8 ug via INTRAVENOUS

## 2023-05-01 MED ORDER — LIDOCAINE HCL (CARDIAC) PF 100 MG/5ML IV SOSY
PREFILLED_SYRINGE | INTRAVENOUS | Status: DC | PRN
  Administered 2023-05-01: 60 mg via INTRAVENOUS

## 2023-05-01 MED ORDER — LACTATED RINGERS IV SOLN: INTRAVENOUS | Status: DC

## 2023-05-01 MED ORDER — PROPOFOL 10 MG/ML IV BOLUS
INTRAVENOUS | Status: DC | PRN
  Administered 2023-05-01: 60 mg via INTRAVENOUS

## 2023-05-01 MED ORDER — PROPOFOL 500 MG/50ML IV EMUL
INTRAVENOUS | Status: DC | PRN
  Administered 2023-05-01: 140 ug/kg/min via INTRAVENOUS

## 2023-05-01 MED ORDER — SODIUM CHLORIDE 0.9 % IV SOLN: INTRAVENOUS | Status: DC

## 2023-05-01 NOTE — Transfer of Care (Signed)
Immediate Anesthesia Transfer of Care Note  Patient: Scott [name]  Procedure(s) Performed: UPPER ESOPHAGEAL ENDOSCOPIC ULTRASOUND (EUS) (Bilateral) FINE NEEDLE ASPIRATION (FNA) LINEAR  Patient Location: PACU  Anesthesia Type:MAC  Level of Consciousness: awake, alert , and oriented  Airway & Oxygen Therapy: Patient Spontanous Breathing  Post-op Assessment: Report given to RN and Post -op Vital signs reviewed and stable  Post vital signs: Reviewed and stable  Last Vitals:  Vitals Value Taken Time  BP    Temp    Pulse    Resp    SpO2      Last Pain:  Vitals:   05/01/23 1011  TempSrc: Temporal  PainSc: 2          Complications: No notable events documented.

## 2023-05-01 NOTE — Op Note (Signed)
[facility] Patient Name: Scott [name] Procedure Date: 05/01/2023 MRN: [id number] Attending MD: Willis Modena , MD, [id number] Date of Birth: 1951/09/17 CSN: [id number] Age: 72 Admit Type: Outpatient Procedure:                Upper EUS Indications:              Suspected mass in pancreas on CT scan Providers:                Willis Modena, MD, Carlena Hurl, Leanne Lovely, Technician Referring MD:             Dr. [name] (Oncology) Medicines:                Monitored Anesthesia Care Complications:            No immediate complications. Estimated Blood Loss:     Estimated blood loss: none. Procedure:                Pre-Anesthesia Assessment:                           - Prior to the procedure, a History and Physical                            was performed, and patient medications and                            allergies were reviewed. [name] tolerance of                            previous anesthesia was also reviewed. The risks                            and benefits of the procedure and the sedation                            options and risks were discussed with the patient.                            All questions were answered, and informed consent                            was obtained. Prior Anticoagulants: The patient has                            taken Eliquis (apixaban), last dose was 3 days                            prior to procedure. ASA Grade Assessment: III - A                            patient with severe systemic disease. After  reviewing the risks and benefits, the patient was                            deemed in satisfactory condition to undergo the                            procedure.                           After obtaining informed consent, the endoscope was                            passed under direct vision. Throughout the                            procedure, the  patient's blood pressure, pulse, and                            oxygen saturations were monitored continuously. The                            [id number] ([id number] Olympus linear ultrasound scope                            was introduced through the mouth, and advanced to                            the second part of duodenum. The upper EUS was                            accomplished without difficulty. The patient                            tolerated the procedure well. Scope In: Scope Out: Findings:      ENDOSONOGRAPHIC FINDING: :      There was no sign of significant endosonographic abnormality in the left       lobe of the liver.      A few lymph nodes were visualized with the ultrasound probe in the       peripancreatic region. The nodes were round.      There was no sign of significant endosonographic abnormality in the       ampulla.      A round mass was identified in the pancreatic body. The mass was       hypoechoic. The mass measured at least 30 mm by 25 mm in maximal       cross-sectional diameter. The endosonographic borders were       poorly-defined. There was sonographic evidence suggesting invasion into       the splenic artery (manifested by invasion) and the splenic vein       (manifested by invasion). Evidence of thrombosis/plaque in splenic       artery/vein. The remainder of the pancreas was examined. The       endosonographic appearance of parenchyma and the upstream pancreatic       duct indicated duct dilation. Fine needle aspiration for cytology was  performed. Color Doppler imaging was utilized prior to needle puncture       to confirm a lack of significant vascular structures within the needle       path. Three passes were made with the 25 gauge needle using a       transgastric approach. A stylet was used. A cytotechnologist was present       to evaluate the adequacy of the specimen. The cellularity of the       specimen was adequate. Final cytology  results are pending. Impression:               - There was no evidence of significant pathology in                            the left lobe of the liver.                           - A few lymph nodes were visualized and measured in                            the peripancreatic region.                           - There was no sign of significant pathology in the                            ampulla.                           - A mass was identified in the pancreatic body.                            This was staged T3 N2 Mx by endosonographic                            criteria. Fine needle aspiration performed. Moderate Sedation:      Not Applicable - Patient had care per Anesthesia. Recommendation:           - Discharge patient to home (via wheelchair).                           - Soft diet today.                           - Continue present medications.                           - Resume Eliquis (apixaban) at prior dose tomorrow.                           - Await cytology results.                           - Return to GI clinic PRN.                           - Return to  referring physician as previously                            scheduled. Procedure Code(s):        --- Professional ---                           [phone], Esophagogastroduodenoscopy, flexible,                            transoral; with transendoscopic ultrasound-guided                            intramural or transmural fine needle                            aspiration/biopsy(s), (includes endoscopic                            ultrasound examination limited to the esophagus,                            stomach or duodenum, and adjacent structures) Diagnosis Code(s):        --- Professional ---                           K86.89, Other specified diseases of pancreas                           R93.3, Abnormal findings on diagnostic imaging of                            other parts of digestive tract CPT copyright 2022 American  Medical Association. All rights reserved. The codes documented in this report are preliminary and upon coder review may  be revised to meet current compliance requirements. Willis Modena, MD 05/01/2023 11:31:19 AM This report has been signed electronically. Number of Addenda: 0

## 2023-05-01 NOTE — Discharge Instructions (Signed)
YOU HAD AN ENDOSCOPIC PROCEDURE TODAY: Refer to the procedure report and other information in the discharge instructions given to you for any specific questions about what was found during the examination. If this information does not answer your questions, please call [facility] office at [phone] to clarify.   YOU SHOULD EXPECT: Some feelings of bloating in the abdomen. Passage of more gas than usual. Walking can help get rid of the air that was put into your GI tract during the procedure and reduce the bloating. If you had a lower endoscopy (such as a colonoscopy or flexible sigmoidoscopy) you may notice spotting of blood in your stool or on the toilet paper. Some abdominal soreness may be present for a day or two, also.  DIET: Your first meal following the procedure should be a light meal and then it is ok to progress to your normal diet. A half-sandwich or bowl of soup is an example of a good first meal. Heavy or fried foods are harder to digest and may make you feel nauseous or bloated. Drink plenty of fluids but you should avoid alcoholic beverages for 24 hours.    ACTIVITY: Your care partner should take you home directly after the procedure. You should plan to take it easy, moving slowly for the rest of the day. You can resume normal activity the day after the procedure however YOU SHOULD NOT DRIVE, use power tools, machinery or perform tasks that involve climbing or major physical exertion for 24 hours (because of the sedation medicines used during the test).   SYMPTOMS TO REPORT IMMEDIATELY: A gastroenterologist can be reached at any hour. Please call [phone]  for any of the following symptoms:   Following upper endoscopy (EGD, EUS, ERCP, esophageal dilation) Vomiting of blood or coffee ground material  New, significant abdominal pain  New, significant chest pain or pain under the shoulder blades  Painful or persistently difficult swallowing  New shortness of breath  Black,  tarry-looking or red, bloody stools  FOLLOW UP:  If any biopsies were taken you will be contacted by phone or by letter within the next 1-3 weeks. Call [phone]  if you have not heard about the biopsies in 3 weeks.  Please also call with any specific questions about appointments or follow up tests. YOU HAD AN ENDOSCOPIC PROCEDURE TODAY: Refer to the procedure report and other information in the discharge instructions given to you for any specific questions about what was found during the examination. If this information does not answer your questions, please call [facility] office at [phone] to clarify.

## 2023-05-01 NOTE — Interval H&P Note (Signed)
History and Physical Interval Note:  05/01/2023 10:31 AM  Scott [name]  has presented today for surgery, with the diagnosis of pancreatic mass.  The various methods of treatment have been discussed with the patient and family. After consideration of risks, benefits and other options for treatment, the patient has consented to  Procedure(s): UPPER ESOPHAGEAL ENDOSCOPIC ULTRASOUND (EUS) (Bilateral) as a surgical intervention.  [name] history has been reviewed, patient examined, no change in status, stable for surgery.  I have reviewed the patient's chart and labs.  Questions were answered to the patient's satisfaction.     Freddy Jaksch

## 2023-05-01 NOTE — Anesthesia Postprocedure Evaluation (Signed)
Anesthesia Post Note  Patient: Scott [name]  Procedure(s) Performed: UPPER ESOPHAGEAL ENDOSCOPIC ULTRASOUND (EUS) (Bilateral) FINE NEEDLE ASPIRATION (FNA) LINEAR     Patient location during evaluation: Endoscopy Anesthesia Type: MAC Level of consciousness: awake and alert Pain management: pain level controlled Vital Signs Assessment: post-procedure vital signs reviewed and stable Respiratory status: spontaneous breathing, nonlabored ventilation and respiratory function stable Cardiovascular status: stable and blood pressure returned to baseline Postop Assessment: no apparent nausea or vomiting Anesthetic complications: no  No notable events documented.  Last Vitals:  Vitals:   05/01/23 1208 05/01/23 1210  BP:  (!) 156/64  Pulse: (!) 46 (!) 45  Resp: 16 17  Temp:    SpO2: 97% 97%    Last Pain:  Vitals:   05/01/23 1210  TempSrc:   PainSc: 3                  ,W. EDMOND

## 2023-05-02 DIAGNOSIS — G893 Neoplasm related pain (acute) (chronic): Secondary | ICD-10-CM | POA: Insufficient documentation

## 2023-05-02 DIAGNOSIS — C259 Malignant neoplasm of pancreas, unspecified: Secondary | ICD-10-CM | POA: Insufficient documentation

## 2023-05-02 NOTE — Assessment & Plan Note (Signed)
-  Patient has chronic back pain, on hydrocodone -His abdominal pain is related to his pancreatic cancer -He will continue follow-up with his PCP for his pain management

## 2023-05-02 NOTE — Assessment & Plan Note (Addendum)
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  -baseline CA19.9 872 -EUS pancreatic mass biopsy confirmed adenocarcinoma, I reviewed with pt -He has been referred to [facility], appointment pending, I will check  -This is likely resectable disease.  I discussed option of upfront surgery and adjuvant chemotherapy, versus neoadjuvant chemotherapy followed by surgery.  I will discuss to his surgeon about his treatment plan.  He is open to our recommendations. -He plan to cancel his trip next week due to his abdominal pain, and wants to start treatment asap. We will arrange his port placement, chemo class, if neoadjuvant chemotherapy is recommended.

## 2023-05-03 ENCOUNTER — Encounter: Payer: Self-pay | Admitting: Hematology

## 2023-05-03 ENCOUNTER — Other Ambulatory Visit: Payer: Self-pay

## 2023-05-03 ENCOUNTER — Inpatient Hospital Stay: Payer: Medicare Other | Attending: Hematology | Admitting: Hematology

## 2023-05-03 DIAGNOSIS — C252 Malignant neoplasm of tail of pancreas: Secondary | ICD-10-CM

## 2023-05-03 DIAGNOSIS — Z5111 Encounter for antineoplastic chemotherapy: Secondary | ICD-10-CM | POA: Insufficient documentation

## 2023-05-03 DIAGNOSIS — G893 Neoplasm related pain (acute) (chronic): Secondary | ICD-10-CM | POA: Diagnosis not present

## 2023-05-03 DIAGNOSIS — Z79899 Other long term (current) drug therapy: Secondary | ICD-10-CM | POA: Insufficient documentation

## 2023-05-03 MED ORDER — LIDOCAINE-PRILOCAINE 2.5-2.5 % EX CREA
TOPICAL_CREAM | CUTANEOUS | 3 refills | Status: DC
Start: 2023-05-03 — End: 2023-07-02

## 2023-05-03 MED ORDER — ONDANSETRON HCL 8 MG PO TABS: 8.0000 mg | ORAL_TABLET | Freq: Three times a day (TID) | ORAL | 1 refills | Status: DC | PRN

## 2023-05-03 MED ORDER — PROCHLORPERAZINE MALEATE 10 MG PO TABS: 10.0000 mg | ORAL_TABLET | Freq: Four times a day (QID) | ORAL | 1 refills | Status: DC | PRN

## 2023-05-03 NOTE — Addendum Note (Signed)
Addended by: [name] on: 05/03/2023 12:19 PM   Modules accepted: Orders

## 2023-05-03 NOTE — Progress Notes (Addendum)
[facility]   [phone] [phone]   Clinic Follow up Note   Patient Care Team: [name], MD as PCP - General [name], [name], MD as PCP - Cardiology (Cardiology) [name], MD as Consulting Physician (Hematology) 05/03/2023  I connected with Scott [name] on 05/03/23 at  9:00 AM EDT by telephone and verified that I am speaking with the correct person using two identifiers.   I discussed the limitations, risks, security and privacy concerns of performing an evaluation and management service by telephone and the availability of in person appointments. I also discussed with the patient that there may be a patient responsible charge related to this service. The patient expressed understanding and agreed to proceed.   Patient's location:  Home  Provider's location:  Office    CHIEF COMPLAINT: review test results    CURRENT THERAPY: pending   ASSESSMENT & PLAN:  Pancreatic cancer (HCC) -cT3N2M0, stage III --Patient presented with abdominal pain and weight loss, CT scan revealed a 4.4 x 3.5 cm ill-defined mass in the pancreatic tail, which 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  -baseline CA19.9 872 -EUS pancreatic mass biopsy confirmed adenocarcinoma, I reviewed with pt. It's staged as T3N2 bu EUS.  -He has been referred to [facility], appointment pending, I will check  -This is likely resectable disease.  I discussed option of upfront surgery and adjuvant chemotherapy, versus neoadjuvant chemotherapy followed by surgery.  I will discuss to his surgeon about his treatment plan.  He is open to our recommendations. -He plan to cancel his trip next week due to his abdominal pain, and wants to start treatment asap. We will arrange his port placement, chemo class, if neoadjuvant chemotherapy is recommended. -He had a CT scan of the chest for lung cancer screening in May 2024, I will get updated  staging scan, if his insurance approves, we will proceed with PET.   Cancer related pain -Patient has chronic back pain, on hydrocodone -His abdominal pain is related to his pancreatic cancer -He will continue follow-up with his PCP for his pain management  Plan -I reviewed his biopsy results, discussed the staging, treatment plan and prognosis. -Will reach out to his pancreatobiliary surgeon, to finalize the treatment plan of surgery and chemotherapy -If neoadjuvant chemo is recommended, will schedule chemo class, port placement, and plan to start chemotherapy FOLFIRINOX the week of August 19. -Patient plans to cancel his trip next week due to his abdominal pain. -PET scan in next 2 weeks   SUMMARY OF ONCOLOGIC HISTORY: Oncology History  Pancreatic cancer (HCC)  05/01/2023 Cancer Staging   Staging form: Exocrine Pancreas, AJCC 8th Edition - Clinical stage from 05/01/2023: Stage IIA (cT3, cN0, cM0) - Signed by [name], MD on 05/02/2023 Total positive nodes: 0   05/02/2023 Initial Diagnosis   Pancreatic cancer (HCC)   05/13/2023 -  Chemotherapy   Patient is on Treatment Plan : PANCREAS Modified FOLFIRINOX q14d x 4 cycles       INTERVAL HISTORY: Pt is here for a routine scheduled follow up.    REVIEW OF SYSTEMS:   Constitutional: Denies fevers, chills or abnormal weight loss Eyes: Denies blurriness of vision Ears, nose, mouth, throat, and face: Denies mucositis or sore throat Respiratory: Denies cough, dyspnea or wheezes Cardiovascular: Denies palpitation, chest discomfort or lower extremity swelling Gastrointestinal:  Denies nausea, heartburn or change in bowel habits Skin: Denies abnormal skin rashes Lymphatics: Denies new lymphadenopathy  or easy bruising Neurological:Denies numbness, tingling or new weaknesses Behavioral/Psych: Mood is stable, no new changes  All other systems were reviewed with the patient and are negative.  MEDICAL HISTORY:  Past Medical History:   Diagnosis Date   CAD (coronary artery disease)    Cerebrovascular disease    HLD (hyperlipidemia)    HTN (hypertension)    IBS (irritable bowel syndrome)    Lumbar stenosis    MI, old    Persistent atrial fibrillation (HCC)    PONV (postoperative nausea and vomiting)    Pre-diabetes    PVD (peripheral vascular disease) (HCC)    Stroke (cerebrum) (HCC)    Tobacco abuse     SURGICAL HISTORY: Past Surgical History:  Procedure Laterality Date   ABDOMINAL AORTOGRAM W/LOWER EXTREMITY N/A 01/25/2017   Procedure: Abdominal Aortogram w/Lower Extremity;  Surgeon: Sherren Kerns, MD;  Location: [facility] INVASIVE CV LAB;  Service: Cardiovascular;  Laterality: N/A;   ANTERIOR CRUCIATE LIGAMENT REPAIR     CARDIAC CATHETERIZATION     CONE   CARDIAC CATHETERIZATION N/A 07/27/2016   Procedure: Left Heart Cath and Coronary Angiography;  Surgeon: Tonny Bollman, MD;  Location: [facility] INVASIVE CV LAB;  Service: Cardiovascular;  Laterality: N/A;   CARDIOVERSION N/A 06/29/2016   Procedure: CARDIOVERSION;  Surgeon: Pricilla Riffle, MD;  Location: [facility] ENDOSCOPY;  Service: Cardiovascular;  Laterality: N/A;   CARDIOVERSION N/A 03/05/2018   Procedure: CARDIOVERSION;  Surgeon: Laurey Morale, MD;  Location: [facility] ENDOSCOPY;  Service: Cardiovascular;  Laterality: N/A;   CHOLECYSTECTOMY     ENDARTERECTOMY Right 12/06/2017   Procedure: ENDARTERECTOMY CAROTID RIGHT;  Surgeon: Nada Libman, MD;  Location: [facility] OR;  Service: Vascular;  Laterality: Right;   KNEE SURGERY  1982, 1988, 1997, 2000   x 4 times.   LOWER EXTREMITY ANGIOGRAPHY N/A 07/03/2019   Procedure: LOWER EXTREMITY ANGIOGRAPHY;  Surgeon: Sherren Kerns, MD;  Location: [facility] INVASIVE CV LAB;  Service: Cardiovascular;  Laterality: N/A;   LUMBAR LAMINECTOMY/DECOMPRESSION MICRODISCECTOMY Bilateral 09/09/2018   Procedure: Laminectomy and Foraminotomy bilateral Lumbar three-Lumbar four - Lumbar four-Lumbar five;  Surgeon: Julio Sicks, MD;  Location: [facility] OR;  Service:  Neurosurgery;  Laterality: Bilateral;   PATCH ANGIOPLASTY Right 12/06/2017   Procedure: PATCH ANGIOPLASTY RIGHT CAROTID ARTERY;  Surgeon: Nada Libman, MD;  Location: [facility] OR;  Service: Vascular;  Laterality: Right;   PERIPHERAL VASCULAR INTERVENTION Right 01/25/2017   Procedure: Peripheral Vascular Intervention;  Surgeon: Sherren Kerns, MD;  Location: [facility] INVASIVE CV LAB;  Service: Cardiovascular;  Laterality: Right;   PERIPHERAL VASCULAR INTERVENTION Right 07/03/2019   Procedure: PERIPHERAL VASCULAR INTERVENTION;  Surgeon: Sherren Kerns, MD;  Location: [facility] INVASIVE CV LAB;  Service: Cardiovascular;  Laterality: Right;   TONSILLECTOMY      I have reviewed the social history and family history with the patient and they are unchanged from previous note.  ALLERGIES:  is allergic to augmentin [amoxicillin-pot clavulanate].  MEDICATIONS:  Current Outpatient Medications  Medication Sig Dispense Refill   amLODipine (NORVASC) 5 MG tablet Take 5 mg by mouth daily.     apixaban (ELIQUIS) 5 MG TABS tablet Take 1 tablet (5 mg total) by mouth 2 (two) times daily. Please keep upcoming appointment. Thanks. 60 tablet 1   aspirin EC 81 MG tablet Take 81 mg by mouth daily.     Carboxymethylcellul-Glycerin (REFRESH OPTIVE OP) Place 1 drop into both eyes every morning.      diazepam (VALIUM) 2 MG tablet Take 2 mg by mouth  2 (two) times daily as needed.     fenofibrate (TRICOR) 48 MG tablet Take 48 mg by mouth daily.     gabapentin (NEURONTIN) 300 MG capsule Take 3 capsules by mouth 2 (two) times daily.     glipiZIDE (GLUCOTROL XL) 5 MG 24 hr tablet Take 5 mg by mouth daily with breakfast.     HYDROcodone-acetaminophen (NORCO/VICODIN) 5-325 MG tablet Take 1-2 tablets by mouth every 6 (six) hours as needed (for pain). 8 tablet 0   JARDIANCE 25 MG TABS tablet Take 25 mg by mouth daily.     metFORMIN (GLUCOPHAGE-XR) 500 MG 24 hr tablet Take 1,000 mg by mouth 2 (two) times daily.     ondansetron (ZOFRAN) 4  MG tablet Take 4 mg by mouth every 8 (eight) hours as needed for nausea or vomiting.     rosuvastatin (CRESTOR) 20 MG tablet Take 1 tablet (20 mg total) by mouth daily. 90 tablet 3   sildenafil (VIAGRA) 100 MG tablet Take by mouth.     spironolactone (ALDACTONE) 25 MG tablet Take 25 mg by mouth daily.     valsartan (DIOVAN) 320 MG tablet Take 320 mg by mouth daily.     No current facility-administered medications for this visit.    PHYSICAL EXAMINATION: Not performed   LABORATORY DATA:  I have reviewed the data as listed    Latest Ref Rng & Units 05/01/2023   11:26 AM 07/03/2019    6:46 AM 09/03/2018    3:27 PM  CBC  WBC 4.0 - 10.5 K/uL 8.5   8.1   Hemoglobin 13.0 - 17.0 g/dL 82.9  56.2  13.0   Hematocrit 39.0 - 52.0 % 43.5  42.0  48.5   Platelets 150 - 400 K/uL 207   321         Latest Ref Rng & Units 05/01/2023   11:26 AM 02/05/2022   11:30 AM 04/26/2021   10:37 AM  CMP  Glucose 70 - 99 mg/dL 94     BUN 8 - 23 mg/dL 18     Creatinine 8.65 - 1.24 mg/dL 7.84     Sodium 696 - 295 mmol/L 137     Potassium 3.5 - 5.1 mmol/L 3.8     Chloride 98 - 111 mmol/L 107     CO2 22 - 32 mmol/L 23     Calcium 8.9 - 10.3 mg/dL 8.6     Total Protein 6.5 - 8.1 g/dL 6.2     Total Bilirubin 0.3 - 1.2 mg/dL 0.5     Alkaline Phos 38 - 126 U/L 95     AST 15 - 41 U/L 144     ALT 0 - 44 U/L 156  15  11       RADIOGRAPHIC STUDIES: I have personally reviewed the radiological images as listed and agreed with the findings in the report. No results found.     I discussed the assessment and treatment plan with the patient. The patient was provided an opportunity to ask questions and all were answered. The patient agreed with the plan and demonstrated an understanding of the instructions.   The patient was advised to call back or seek an in-person evaluation if the symptoms worsen or if the condition fails to improve as anticipated.  I provided 25 minutes of non face-to-face telephone visit time  during this encounter, and > 50% was spent counseling as documented under my assessment & plan.     [name], MD 05/03/23

## 2023-05-03 NOTE — Progress Notes (Signed)
START ON PATHWAY REGIMEN - Pancreatic Adenocarcinoma     A cycle is every 14 days:     Irinotecan      Oxaliplatin      Leucovorin      Fluorouracil   **Always confirm dose/schedule in your pharmacy ordering system**  Patient Characteristics: Preoperative, M0 (Clinical Staging), Resectable, Neoadjuvant Therapy Planned, BRCA1/2 and PALB2 Mutation Absent/Unknown Therapeutic Status: Preoperative, M0 (Clinical Staging) AJCC T Category: cT3 AJCC N Category: cN0 Resectability Status: Resectable AJCC M Category: cM0 AJCC 8 Stage Grouping: IIA BRCA1/2 Mutation Status: Awaiting Test Results PALB2 Mutation Status: Awaiting Test Results Intent of Therapy: Curative Intent, Discussed with Patient

## 2023-05-03 NOTE — Addendum Note (Signed)
Addended by: [name] on: 05/03/2023 02:44 PM   Modules accepted: Orders

## 2023-05-04 ENCOUNTER — Other Ambulatory Visit: Payer: Self-pay

## 2023-05-04 ENCOUNTER — Other Ambulatory Visit: Payer: Self-pay | Admitting: Cardiovascular Disease

## 2023-05-04 DIAGNOSIS — I4891 Unspecified atrial fibrillation: Secondary | ICD-10-CM

## 2023-05-05 ENCOUNTER — Encounter (HOSPITAL_COMMUNITY): Payer: Self-pay | Admitting: Gastroenterology

## 2023-05-06 ENCOUNTER — Telehealth: Payer: Self-pay | Admitting: *Deleted

## 2023-05-06 ENCOUNTER — Other Ambulatory Visit: Payer: Self-pay | Admitting: Hematology

## 2023-05-06 ENCOUNTER — Encounter: Payer: Self-pay | Admitting: Hematology

## 2023-05-06 ENCOUNTER — Other Ambulatory Visit: Payer: Self-pay | Admitting: General Surgery

## 2023-05-06 NOTE — Telephone Encounter (Signed)
   Pre-operative Risk Assessment    Patient Name: Scott [name]  DOB: 1951-04-04 MRN: [id number]      Request for Surgical Clearance    Procedure:   PORT-A-CATH PLACEMENT  Date of Surgery:  Clearance TBD                                 Surgeon:  DR. Almond Lint Surgeon's Group or Practice Name:  [facility] Phone number:  [phone] Fax number:  [phone] ATTN: Santiago Glad, CMA   Type of Clearance Requested:   - Medical  - Pharmacy:  Hold Apixaban (Eliquis)     Type of Anesthesia:  General    Additional requests/questions:    [name]   05/06/2023, 2:31 PM

## 2023-05-07 ENCOUNTER — Encounter: Payer: Self-pay | Admitting: Hematology

## 2023-05-07 ENCOUNTER — Other Ambulatory Visit: Payer: Self-pay | Admitting: Hematology

## 2023-05-07 NOTE — Progress Notes (Signed)
I spoke with Mr Koltes regarding date, time, and location of PET scan, 8/26 arrive [facility] at 0730.  Npo after 0200.  I reviewed use of emla cream with the port a cath.  I reviewed his upcoming appt for chemo education.  All questions were answered.  He verbalized understanding.

## 2023-05-07 NOTE — Telephone Encounter (Signed)
Adelina Mings, surgery scheduler for [name] called back and left vm that the procedure to be done at this time is just for the port-a-cath placement.

## 2023-05-07 NOTE — Telephone Encounter (Signed)
Patient with diagnosis of afib on Eliquis for anticoagulation.    Procedure: port-a-cath placement Date of procedure: TBD  CHA2DS2-VASc Score = 6  This indicates a 9.7% annual risk of stroke. [name] score is based upon: CHF History: 0 HTN History: 1 Diabetes History: 1 Stroke History: 2 Vascular Disease History: 1 Age Score: 1 Gender Score: 0   CVA 11/2017  CrCl >153mL/min Platelet count 207K  Would clarify procedure. This note says port-a-cath but [facility] note from yesterday says they have sent message to cardiology for distal pancreatectomy which would be a higher bleed risk procedure and require a longer anticoag hold. Ok to hold Eliquis for 1 day for port-a-cath placement, ok to hold up to 3 days for pancreatectomy.  **This guidance is not considered finalized until pre-operative APP has relayed final recommendations.**

## 2023-05-07 NOTE — Telephone Encounter (Signed)
I called Dr. Arita Miss office to clarify procedure to be done. I s/w Tresa Endo one of the surgery schedulers. She is going to confirm with Santiago Glad, CMA who sent the request as to clarify which procedure is being done. See notes as we are seeing notes from [facility] for procedure Pancreatectomy. Tresa Endo, states not sure if needing clearance for both procedures or for just one of the procedures. Tresa Endo will call back once she has clarification.

## 2023-05-08 ENCOUNTER — Encounter: Payer: Self-pay | Admitting: Hematology

## 2023-05-09 ENCOUNTER — Other Ambulatory Visit: Payer: Self-pay

## 2023-05-09 ENCOUNTER — Inpatient Hospital Stay: Payer: Medicare Other

## 2023-05-09 DIAGNOSIS — C252 Malignant neoplasm of tail of pancreas: Secondary | ICD-10-CM

## 2023-05-09 NOTE — Progress Notes (Signed)
Pharmacist Chemotherapy Monitoring - Initial Assessment    Anticipated start date: 05/16/23   The following has been reviewed per standard work regarding the patient's treatment regimen: [name] diagnosis, treatment plan and drug doses, and organ/hematologic function Lab orders and baseline tests specific to treatment regimen  The treatment plan start date, drug sequencing, and pre-medications Prior authorization status  Patient's documented medication list, including drug-drug interaction screen and prescriptions for anti-emetics and supportive care specific to the treatment regimen The drug concentrations, fluid compatibility, administration routes, and timing of the medications to be used [name] access for treatment and lifetime cumulative dose history, if applicable  [name] medication allergies and previous infusion related reactions, if applicable   Changes made to treatment plan:  treatment plan date  Follow up needed:  N/A   Jerry Caras, PharmD PGY2 Oncology Pharmacy Resident   05/09/2023 3:18 PM

## 2023-05-09 NOTE — Pre-Procedure Instructions (Signed)
Surgical Instructions    Your procedure is scheduled on May 14, 2023.  Report to [facility] Main Entrance "A" at 12:30 P.M., then check in with the Admitting office.  Call this number if you have problems the morning of surgery:  [phone]  If you have any questions prior to your surgery date call [phone]: Open Monday-Friday 8am-4pm If you experience any cold or flu symptoms such as cough, fever, chills, shortness of breath, etc. between now and your scheduled surgery, please notify us at the above number.     Remember:  Do not eat after midnight the night before your surgery  You may drink clear liquids until 11:30 AM the morning of your surgery.   Clear liquids allowed are: Water, Non-Citrus Juices (without pulp), Carbonated Beverages, Clear Tea, Black Coffee Only (NO MILK, CREAM OR POWDERED CREAMER of any kind), and Gatorade.     Take these medicines the morning of surgery with A SIP OF WATER:  docusate sodium (COLACE)   fenofibrate (TRICOR)   gabapentin (NEURONTIN)   rosuvastatin (CRESTOR)     May take these medicines IF NEEDED:  diazepam (VALIUM)   esomeprazole (NEXIUM)   HYDROcodone-acetaminophen (NORCO/VICODIN)   hyoscyamine (LEVSIN SL)   ondansetron (ZOFRAN)   oxyCODONE-acetaminophen (PERCOCET/ROXICET)  prochlorperazine (COMPAZINE)    Follow your surgeon's instructions on when to stop Aspirin and apixaban (ELIQUIS).  If no instructions were given by your surgeon then you will need to call the office to get those instructions.     As of today, STOP taking any Aleve, Naproxen, Ibuprofen, Motrin, Advil, Goody's, BC's, all herbal medications, fish oil, and all vitamins.   WHAT DO I DO ABOUT MY DIABETES MEDICATION?   DO NOT take glipiZIDE (GLUCOTROL XL) the evening before surgery or the morning of surgery  DO NOT take metFORMIN (GLUCOPHAGE-XR) the morning of surgery.     STOP taking JARDIANCE three days prior to surgery. Your last dose will be August  16th.   HOW TO MANAGE YOUR DIABETES BEFORE AND AFTER SURGERY  Why is it important to control my blood sugar before and after surgery? Improving blood sugar levels before and after surgery helps healing and can limit problems. A way of improving blood sugar control is eating a healthy diet by:  Eating less sugar and carbohydrates  Increasing activity/exercise  Talking with your doctor about reaching your blood sugar goals High blood sugars (greater than 180 mg/dL) can raise your risk of infections and slow your recovery, so you will need to focus on controlling your diabetes during the weeks before surgery. Make sure that the doctor who takes care of your diabetes knows about your planned surgery including the date and location.  How do I manage my blood sugar before surgery? Check your blood sugar at least 4 times a day, starting 2 days before surgery, to make sure that the level is not too high or low.  Check your blood sugar the morning of your surgery when you wake up and every 2 hours until you get to the Short Stay unit.  If your blood sugar is less than 70 mg/dL, you will need to treat for low blood sugar: Do not take insulin. Treat a low blood sugar (less than 70 mg/dL) with  cup of clear juice (cranberry or apple), 4 glucose tablets, OR glucose gel. Recheck blood sugar in 15 minutes after treatment (to make sure it is greater than 70 mg/dL). If your blood sugar is not greater than 70 mg/dL on  recheck, call [phone] for further instructions. Report your blood sugar to the short stay nurse when you get to Short Stay.  If you are admitted to the hospital after surgery: Your blood sugar will be checked by the staff and you will probably be given insulin after surgery (instead of oral diabetes medicines) to make sure you have good blood sugar levels. The goal for blood sugar control after surgery is 80-180 mg/dL.                      Do NOT Smoke (Tobacco/Vaping) for 24 hours  prior to your procedure.  If you use a CPAP at night, you may bring your mask/headgear for your overnight stay.   Contacts, glasses, piercing's, hearing aid's, dentures or partials may not be worn into surgery, please bring cases for these belongings.    For patients admitted to the hospital, discharge time will be determined by your treatment team.   Patients discharged the day of surgery will not be allowed to drive home, and someone needs to stay with them for 24 hours.  SURGICAL WAITING ROOM VISITATION Patients having surgery or a procedure may have no more than 2 support people in the waiting area - these visitors may rotate.   Children under the age of 5 must have an adult with them who is not the patient. If the patient needs to stay at the hospital during part of their recovery, the visitor guidelines for inpatient rooms apply. Pre-op nurse will coordinate an appropriate time for 1 support person to accompany patient in pre-op.  This support person may not rotate.   Please refer to the [facility] website for the visitor guidelines for Inpatients (after your surgery is over and you are in a regular room).    Special instructions:   [place]- Preparing For Surgery  Before surgery, you can play an important role. Because skin is not sterile, your skin needs to be as free of germs as possible. You can reduce the number of germs on your skin by washing with CHG (chlorahexidine gluconate) Soap before surgery.  CHG is an antiseptic cleaner which kills germs and bonds with the skin to continue killing germs even after washing.    Oral Hygiene is also important to reduce your risk of infection.  Remember - BRUSH YOUR TEETH THE MORNING OF SURGERY WITH YOUR REGULAR TOOTHPASTE  Please do not use if you have an allergy to CHG or antibacterial soaps. If your skin becomes reddened/irritated stop using the CHG.  Do not shave (including legs and underarms) for at least 48 hours prior to first  CHG shower. It is OK to shave your face.  Please follow these instructions carefully.   Shower the NIGHT BEFORE SURGERY and the MORNING OF SURGERY  If you chose to wash your hair, wash your hair first as usual with your normal shampoo.  After you shampoo, rinse your hair and body thoroughly to remove the shampoo.  Use CHG Soap as you would any other liquid soap. You can apply CHG directly to the skin and wash gently with a scrungie or a clean washcloth.   Apply the CHG Soap to your body ONLY FROM THE NECK DOWN.  Do not use on open wounds or open sores. Avoid contact with your eyes, ears, mouth and genitals (private parts). Wash Face and genitals (private parts)  with your normal soap.   Wash thoroughly, paying special attention to the area where your surgery will  be performed.  Thoroughly rinse your body with warm water from the neck down.  DO NOT shower/wash with your normal soap after using and rinsing off the CHG Soap.  Pat yourself dry with a CLEAN TOWEL.  Wear CLEAN PAJAMAS to bed the night before surgery  Place CLEAN SHEETS on your bed the night before your surgery  DO NOT SLEEP WITH PETS.   Day of Surgery: Take a shower with CHG soap. Do not wear jewelry or makeup Do not wear lotions, powders, perfumes/colognes, or deodorant. Do not shave 48 hours prior to surgery.  Men may shave face and neck. Do not bring valuables to the hospital.  [facility] is not responsible for any belongings or valuables. Do not wear nail polish, gel polish, artificial nails, or any other type of covering on natural nails (fingers and toes) If you have artificial nails or gel coating that need to be removed by a nail salon, please have this removed prior to surgery. Artificial nails or gel coating may interfere with anesthesia's ability to adequately monitor your vital signs. Wear Clean/Comfortable clothing the morning of surgery Remember to brush your teeth WITH YOUR REGULAR TOOTHPASTE.    Please read over the following fact sheets that you were given.    If you received a COVID test during your pre-op visit  it is requested that you wear a mask when out in public, stay away from anyone that may not be feeling well and notify your surgeon if you develop symptoms. If you have been in contact with anyone that has tested positive in the last 10 days please notify you surgeon.

## 2023-05-10 ENCOUNTER — Other Ambulatory Visit: Payer: Self-pay

## 2023-05-10 ENCOUNTER — Encounter (HOSPITAL_COMMUNITY): Admission: RE | Admit: 2023-05-10 | Payer: Medicare Other | Source: Ambulatory Visit

## 2023-05-10 ENCOUNTER — Encounter (HOSPITAL_COMMUNITY): Payer: Self-pay

## 2023-05-10 VITALS — BP 185/84 | HR 83 | Temp 98.9°F | Resp 17 | Ht 70.0 in | Wt 151.4 lb

## 2023-05-10 DIAGNOSIS — Z01812 Encounter for preprocedural laboratory examination: Secondary | ICD-10-CM | POA: Insufficient documentation

## 2023-05-10 DIAGNOSIS — I252 Old myocardial infarction: Secondary | ICD-10-CM | POA: Insufficient documentation

## 2023-05-10 DIAGNOSIS — I2510 Atherosclerotic heart disease of native coronary artery without angina pectoris: Secondary | ICD-10-CM | POA: Insufficient documentation

## 2023-05-10 DIAGNOSIS — I10 Essential (primary) hypertension: Secondary | ICD-10-CM | POA: Insufficient documentation

## 2023-05-10 DIAGNOSIS — E119 Type 2 diabetes mellitus without complications: Secondary | ICD-10-CM | POA: Insufficient documentation

## 2023-05-10 DIAGNOSIS — Z01818 Encounter for other preprocedural examination: Secondary | ICD-10-CM | POA: Diagnosis present

## 2023-05-10 HISTORY — DX: Type 2 diabetes mellitus without complications: E11.9

## 2023-05-10 LAB — GLUCOSE, CAPILLARY: Glucose-Capillary: 141 mg/dL — ABNORMAL HIGH (ref 70–99)

## 2023-05-10 LAB — HEMOGLOBIN A1C
Hgb A1c MFr Bld: 8.2 % — ABNORMAL HIGH (ref 4.8–5.6)
Mean Plasma Glucose: 188.64 mg/dL

## 2023-05-10 NOTE — Progress Notes (Signed)
PCP - Johny Blamer Cardiologist - Kristeen Miss  PPM/ICD - Denies   Chest x-ray - N/I EKG - 03/12/23 Stress Test - 06/27/16 ECHO - 07/25/18 Cardiac Cath - 07/27/16  Sleep Study - Denies CPAP -   DM - Type II CBG @ PAT appt 143 Fasting Blood Sugar - 99-124 Checks Blood Sugar __several___ times a day   Blood Thinner Instructions: Eliquis Requested patient to call [name] today and find out when to stop Aspirin Instructions:Aspirin Requested patient to call [name] today and find out when to stop  ERAS Protcol -Yes  COVID TEST-  N/I   Anesthesia review: Yes cardiac history Afib   Patient denies shortness of breath, fever, cough and chest pain at PAT appointment   All instructions explained to the patient, with a verbal understanding of the material. Patient agrees to go over the instructions while at home for a better understanding. The opportunity to ask questions was provided.

## 2023-05-10 NOTE — Telephone Encounter (Signed)
     Primary Cardiologist: Kristeen Miss, MD  Chart reviewed as part of pre-operative protocol coverage. Given past medical history and time since last visit, based on ACC/AHA guidelines, IVER MELLEY would be at acceptable risk for the planned procedure without further cardiovascular testing.   Patient with diagnosis of afib on Eliquis for anticoagulation.     Procedure: port-a-cath placement Date of procedure: TBD   CHA2DS2-VASc Score = 6  This indicates a 9.7% annual risk of stroke. [name] score is based upon: CHF History: 0 HTN History: 1 Diabetes History: 1 Stroke History: 2 Vascular Disease History: 1 Age Score: 1 Gender Score: 0   CVA 11/2017   CrCl >122mL/min Platelet count 207K   Would clarify procedure. This note says port-a-cath but [facility] note from yesterday says they have sent message to cardiology for distal pancreatectomy which would be a higher bleed risk procedure and require a longer anticoag hold. Ok to hold Eliquis for 1 day for port-a-cath placement  I will route this recommendation to the requesting party via Epic fax function and remove from pre-op pool.  Please call with questions.  Thomasene Ripple. Lechelle Wrigley NP-C     05/10/2023, 11:07 AM [facility] [address] Office [phone] Fax [phone]

## 2023-05-13 ENCOUNTER — Encounter: Payer: Self-pay | Admitting: Hematology

## 2023-05-13 NOTE — Progress Notes (Signed)
Anesthesia Chart Review:  Follows with cardiology for history of HTN, HLD, A-fib s/p ablation 2021, PAD s/p stenting of R SFA and right CEA, CAD s/p inferior MI.  Cath 2017 showed widely patent LAD and diagonal branches with mild nonobstructive disease, continued patency of the stented segment of the distal RCA with mild nonobstructive RCA stenosis, large dominant vessel, CTO of a small left circumflex with right to left collaterals, normal systolic function with LVEF 55 to 60%.  Clearance 05/10/2023 by Edd Fabian, NP, "Chart reviewed as part of pre-operative protocol coverage. Given past medical history and time since last visit, based on ACC/AHA guidelines, LLEYTON BAISA would be at acceptable risk for the planned procedure without further cardiovascular testing.Marland KitchenMarland KitchenOk to hold Eliquis for 1 day for port-a-cath placement."  Current every day smoker.   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  CMP and CBC from 05/01/2023 reviewed, moderately elevated AST and ALT 144 and 156 respectively, otherwise unremarkable.  EKG 03/12/2023: Sinus bradycardia.  Rate 55.  Nonspecific ST and T wave abnormality.  Carotid duplex 01/10/2023: Summary:  Right Carotid: Velocities in the right ICA are consistent with a 40-59% stenosis.  Left Carotid: Velocities in the left ICA are consistent with a 1-39% stenosis. The ECA appears >50% stenosed.  Vertebrals:  Bilateral vertebral arteries demonstrate antegrade flow.  Subclavians: Normal flow hemodynamics were seen in bilateral subclavian arteries.   Cath 07/27/2016: 1. Widely patent LAD and diagonal branches with mild nonobstructive disease 2. Continued patency of the stented segment in the distal RCA with mild nonobstructive RCA stenosis, large dominant vessel 3. 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 Normal LV systolic function with LVEF estimated at 55-60%, normal LVEDP   Recommendations: Continued medical therapy and risk  reduction measures. Okay to resume apixaban tonight.   Zannie Cove [facility] Short Stay Center/Anesthesiology Phone [phone] 05/13/2023 10:15 AM

## 2023-05-13 NOTE — Anesthesia Preprocedure Evaluation (Signed)
Anesthesia Evaluation  Patient identified by MRN, date of birth, ID band Patient awake    Reviewed: Allergy & Precautions, H&P , NPO status , Patient's Chart, lab work & pertinent test results  Airway Mallampati: II  TM Distance: >3 FB Neck ROM: Full    Dental no notable dental hx.    Pulmonary Current Smoker   Pulmonary exam normal breath sounds clear to auscultation       Cardiovascular hypertension, + Past MI, + Cardiac Stents and + Peripheral Vascular Disease  Normal cardiovascular exam Rhythm:Regular Rate:Normal     Neuro/Psych CVA, No Residual Symptoms  negative psych ROS   GI/Hepatic negative GI ROS, Neg liver ROS,,,  Endo/Other  diabetes    Renal/GU negative Renal ROS  negative genitourinary   Musculoskeletal negative musculoskeletal ROS (+)    Abdominal   Peds negative pediatric ROS (+)  Hematology negative hematology ROS (+)   Anesthesia Other Findings   Reproductive/Obstetrics negative OB ROS                             Anesthesia Physical Anesthesia Plan  ASA: 3  Anesthesia Plan: General   Post-op Pain Management: Minimal or no pain anticipated and Tylenol PO (pre-op)*   Induction: Intravenous  PONV Risk Score and Plan: 1 and Ondansetron and Treatment may vary due to age or medical condition  Airway Management Planned: LMA  Additional Equipment:   Intra-op Plan:   Post-operative Plan: Extubation in OR  Informed Consent: I have reviewed the patients History and Physical, chart, labs and discussed the procedure including the risks, benefits and alternatives for the proposed anesthesia with the patient or authorized representative who has indicated his/her understanding and acceptance.     Dental advisory given  Plan Discussed with: CRNA and Surgeon  Anesthesia Plan Comments: (PAT note by Antionette Poles, PA-C: Follows with cardiology for history of HTN, HLD,  A-fib s/p ablation 2021, PAD s/p stenting of R SFA and right CEA, CAD s/p inferior MI.  Cath 2017 showed widely patent LAD and diagonal branches with mild nonobstructive disease, continued patency of the stented segment of the distal RCA with mild nonobstructive RCA stenosis, large dominant vessel, CTO of a small left circumflex with right to left collaterals, normal systolic function with LVEF 55 to 60%.  Clearance 05/10/2023 by Edd Fabian, NP, "Chart reviewed as part of pre-operative protocol coverage. Given past medical history and time since last visit, based on ACC/AHA guidelines, CHRISTOPHR MILETO would be at acceptable risk for the planned procedure without further cardiovascular testing.Marland KitchenMarland KitchenOk to hold Eliquis for 1 day for port-a-cath placement."  Current every day smoker.   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  CMP and CBC from 05/01/2023 reviewed, moderately elevated AST and ALT 144 and 156 respectively, otherwise unremarkable.  EKG 03/12/2023: Sinus bradycardia.  Rate 55.  Nonspecific ST and T wave abnormality.  Carotid duplex 01/10/2023: Summary:  Right Carotid: Velocities in the right ICA are consistent with a 40-59% stenosis.  Left Carotid: Velocities in the left ICA are consistent with a 1-39% stenosis. The ECA appears >50% stenosed.  Vertebrals: Bilateral vertebral arteries demonstrate antegrade flow.  Subclavians: Normal flow hemodynamics were seen in bilateral subclavian arteries.   Cath 07/27/2016: 1. Widely patent LAD and diagonal branches with mild nonobstructive disease 2. Continued patency of the stented segment in the distal RCA with mild nonobstructive RCA stenosis, large dominant vessel 3. 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 Normal LV systolic function with LVEF estimated at 55-60%, normal LVEDP  Recommendations: Continued medical therapy and risk reduction measures. Okay to resume apixaban tonight.   )        Anesthesia  Quick Evaluation

## 2023-05-14 ENCOUNTER — Ambulatory Visit (HOSPITAL_COMMUNITY): Payer: Medicare Other

## 2023-05-14 ENCOUNTER — Other Ambulatory Visit: Payer: Self-pay

## 2023-05-14 ENCOUNTER — Ambulatory Visit (HOSPITAL_COMMUNITY): Admission: RE | Admit: 2023-05-14 | Payer: Medicare Other | Source: Home / Self Care | Admitting: General Surgery

## 2023-05-14 ENCOUNTER — Encounter (HOSPITAL_COMMUNITY)
Admission: RE | Disposition: A | Discharge status: Home / Self Care | Payer: Self-pay | Source: Home / Self Care | Attending: General Surgery

## 2023-05-14 ENCOUNTER — Ambulatory Visit (HOSPITAL_BASED_OUTPATIENT_CLINIC_OR_DEPARTMENT_OTHER): Payer: Medicare Other

## 2023-05-14 ENCOUNTER — Ambulatory Visit (HOSPITAL_COMMUNITY): Payer: Medicare Other | Admitting: Physician Assistant

## 2023-05-14 DIAGNOSIS — K5900 Constipation, unspecified: Secondary | ICD-10-CM | POA: Insufficient documentation

## 2023-05-14 DIAGNOSIS — I10 Essential (primary) hypertension: Secondary | ICD-10-CM

## 2023-05-14 DIAGNOSIS — C252 Malignant neoplasm of tail of pancreas: Secondary | ICD-10-CM | POA: Insufficient documentation

## 2023-05-14 DIAGNOSIS — Z955 Presence of coronary angioplasty implant and graft: Secondary | ICD-10-CM | POA: Insufficient documentation

## 2023-05-14 DIAGNOSIS — Z8673 Personal history of transient ischemic attack (TIA), and cerebral infarction without residual deficits: Secondary | ICD-10-CM | POA: Insufficient documentation

## 2023-05-14 DIAGNOSIS — F17210 Nicotine dependence, cigarettes, uncomplicated: Secondary | ICD-10-CM

## 2023-05-14 DIAGNOSIS — Z794 Long term (current) use of insulin: Secondary | ICD-10-CM | POA: Diagnosis not present

## 2023-05-14 DIAGNOSIS — Z79891 Long term (current) use of opiate analgesic: Secondary | ICD-10-CM | POA: Diagnosis not present

## 2023-05-14 DIAGNOSIS — Z7901 Long term (current) use of anticoagulants: Secondary | ICD-10-CM | POA: Diagnosis not present

## 2023-05-14 DIAGNOSIS — I482 Chronic atrial fibrillation, unspecified: Secondary | ICD-10-CM | POA: Insufficient documentation

## 2023-05-14 DIAGNOSIS — Z7984 Long term (current) use of oral hypoglycemic drugs: Secondary | ICD-10-CM | POA: Diagnosis not present

## 2023-05-14 DIAGNOSIS — G894 Chronic pain syndrome: Secondary | ICD-10-CM | POA: Diagnosis not present

## 2023-05-14 DIAGNOSIS — E119 Type 2 diabetes mellitus without complications: Secondary | ICD-10-CM

## 2023-05-14 DIAGNOSIS — I2510 Atherosclerotic heart disease of native coronary artery without angina pectoris: Secondary | ICD-10-CM

## 2023-05-14 DIAGNOSIS — E43 Unspecified severe protein-calorie malnutrition: Secondary | ICD-10-CM | POA: Diagnosis not present

## 2023-05-14 DIAGNOSIS — I252 Old myocardial infarction: Secondary | ICD-10-CM | POA: Diagnosis not present

## 2023-05-14 DIAGNOSIS — J432 Centrilobular emphysema: Secondary | ICD-10-CM | POA: Diagnosis not present

## 2023-05-14 DIAGNOSIS — I700 Atherosclerosis of aorta: Secondary | ICD-10-CM | POA: Diagnosis not present

## 2023-05-14 DIAGNOSIS — Z8249 Family history of ischemic heart disease and other diseases of the circulatory system: Secondary | ICD-10-CM | POA: Diagnosis not present

## 2023-05-14 DIAGNOSIS — E1151 Type 2 diabetes mellitus with diabetic peripheral angiopathy without gangrene: Secondary | ICD-10-CM | POA: Insufficient documentation

## 2023-05-14 DIAGNOSIS — Z833 Family history of diabetes mellitus: Secondary | ICD-10-CM | POA: Insufficient documentation

## 2023-05-14 HISTORY — PX: PORTACATH PLACEMENT: SHX2246

## 2023-05-14 LAB — GLUCOSE, CAPILLARY
Glucose-Capillary: 110 mg/dL — ABNORMAL HIGH (ref 70–99)
Glucose-Capillary: 104 mg/dL — ABNORMAL HIGH (ref 70–99)

## 2023-05-14 SURGERY — INSERTION, TUNNELED CENTRAL VENOUS DEVICE, WITH PORT
Anesthesia: General | Site: Chest | Laterality: Left

## 2023-05-14 MED ORDER — LIDOCAINE 2% (20 MG/ML) 5 ML SYRINGE
INTRAMUSCULAR | Status: DC | PRN
  Administered 2023-05-14: 100 mg via INTRAVENOUS

## 2023-05-14 MED ORDER — HEPARIN SOD (PORK) LOCK FLUSH 100 UNIT/ML IV SOLN
INTRAVENOUS | Status: DC | PRN
  Administered 2023-05-14: 500 [IU] via INTRAVENOUS

## 2023-05-14 MED ORDER — PROPOFOL 10 MG/ML IV BOLUS
INTRAVENOUS | Status: AC
  Filled 2023-05-14: qty 20

## 2023-05-14 MED ORDER — LACTATED RINGERS IV SOLN: INTRAVENOUS | Status: DC

## 2023-05-14 MED ORDER — HEPARIN 6000 UNIT IRRIGATION SOLUTION
Status: DC | PRN
  Administered 2023-05-14: 1

## 2023-05-14 MED ORDER — LIDOCAINE HCL (PF) 1 % IJ SOLN
INTRAMUSCULAR | Status: AC
  Filled 2023-05-14: qty 30

## 2023-05-14 MED ORDER — CHLORHEXIDINE GLUCONATE 0.12 % MT SOLN
15.0000 mL | Freq: Once | OROMUCOSAL | Status: AC
  Administered 2023-05-14: 15 mL via OROMUCOSAL
  Filled 2023-05-14: qty 15

## 2023-05-14 MED ORDER — INSULIN ASPART 100 UNIT/ML IJ SOLN: 0.0000 [IU] | INTRAMUSCULAR | Status: DC | PRN

## 2023-05-14 MED ORDER — LIDOCAINE HCL 1 % IJ SOLN
INTRAMUSCULAR | Status: DC | PRN
  Administered 2023-05-14: 10 mL via INTRAMUSCULAR

## 2023-05-14 MED ORDER — ONDANSETRON HCL 4 MG/2ML IJ SOLN: 4.0000 mg | Freq: Once | INTRAMUSCULAR | Status: DC | PRN

## 2023-05-14 MED ORDER — PHENYLEPHRINE 80 MCG/ML (10ML) SYRINGE FOR IV PUSH (FOR BLOOD PRESSURE SUPPORT)
PREFILLED_SYRINGE | INTRAVENOUS | Status: DC | PRN
  Administered 2023-05-14: 80 ug via INTRAVENOUS

## 2023-05-14 MED ORDER — FENTANYL CITRATE (PF) 100 MCG/2ML IJ SOLN: 25.0000 ug | INTRAMUSCULAR | Status: DC | PRN

## 2023-05-14 MED ORDER — GLYCOPYRROLATE 0.2 MG/ML IJ SOLN
INTRAMUSCULAR | Status: DC | PRN
  Administered 2023-05-14: .2 mg via INTRAVENOUS

## 2023-05-14 MED ORDER — MIDAZOLAM HCL 2 MG/2ML IJ SOLN
INTRAMUSCULAR | Status: AC
  Filled 2023-05-14: qty 2

## 2023-05-14 MED ORDER — BUPIVACAINE-EPINEPHRINE (PF) 0.25% -1:200000 IJ SOLN
INTRAMUSCULAR | Status: AC
  Filled 2023-05-14: qty 30

## 2023-05-14 MED ORDER — CHLORHEXIDINE GLUCONATE CLOTH 2 % EX PADS: 6.0000 | MEDICATED_PAD | Freq: Once | CUTANEOUS | Status: DC

## 2023-05-14 MED ORDER — CIPROFLOXACIN IN D5W 400 MG/200ML IV SOLN
400.0000 mg | INTRAVENOUS | Status: AC
  Administered 2023-05-14: 400 mg via INTRAVENOUS
  Filled 2023-05-14: qty 200

## 2023-05-14 MED ORDER — FENTANYL CITRATE (PF) 250 MCG/5ML IJ SOLN
INTRAMUSCULAR | Status: AC
  Filled 2023-05-14: qty 5

## 2023-05-14 MED ORDER — ACETAMINOPHEN 500 MG PO TABS
1000.0000 mg | ORAL_TABLET | ORAL | Status: AC
  Administered 2023-05-14: 1000 mg via ORAL
  Filled 2023-05-14: qty 2

## 2023-05-14 MED ORDER — EPHEDRINE SULFATE-NACL 50-0.9 MG/10ML-% IV SOSY
PREFILLED_SYRINGE | INTRAVENOUS | Status: DC | PRN
  Administered 2023-05-14: 10 mg via INTRAVENOUS
  Administered 2023-05-14 (×2): 5 mg via INTRAVENOUS

## 2023-05-14 MED ORDER — 0.9 % SODIUM CHLORIDE (POUR BTL) OPTIME
TOPICAL | Status: DC | PRN
  Administered 2023-05-14: 1000 mL

## 2023-05-14 MED ORDER — MIDAZOLAM HCL 2 MG/2ML IJ SOLN
INTRAMUSCULAR | Status: DC | PRN
  Administered 2023-05-14: 1 mg via INTRAVENOUS

## 2023-05-14 MED ORDER — OXYCODONE HCL 5 MG PO TABS: 5.0000 mg | ORAL_TABLET | Freq: Once | ORAL | Status: DC | PRN

## 2023-05-14 MED ORDER — PHENYLEPHRINE 80 MCG/ML (10ML) SYRINGE FOR IV PUSH (FOR BLOOD PRESSURE SUPPORT)
PREFILLED_SYRINGE | INTRAVENOUS | Status: AC
  Filled 2023-05-14: qty 10

## 2023-05-14 MED ORDER — EPHEDRINE 5 MG/ML INJ
INTRAVENOUS | Status: AC
  Filled 2023-05-14: qty 5

## 2023-05-14 MED ORDER — ONDANSETRON HCL 4 MG/2ML IJ SOLN
INTRAMUSCULAR | Status: DC | PRN
Start: 2023-05-14 — End: 2023-05-14
  Administered 2023-05-14: 4 mg via INTRAVENOUS

## 2023-05-14 MED ORDER — PROPOFOL 10 MG/ML IV BOLUS
INTRAVENOUS | Status: DC | PRN
  Administered 2023-05-14: 200 mg via INTRAVENOUS

## 2023-05-14 MED ORDER — OXYCODONE HCL 5 MG PO TABS: 5.0000 mg | ORAL_TABLET | Freq: Four times a day (QID) | ORAL | 0 refills | Status: AC | PRN

## 2023-05-14 MED ORDER — OXYCODONE HCL 5 MG/5ML PO SOLN: 5.0000 mg | Freq: Once | ORAL | Status: DC | PRN

## 2023-05-14 MED ORDER — HEPARIN SOD (PORK) LOCK FLUSH 100 UNIT/ML IV SOLN
INTRAVENOUS | Status: AC
  Filled 2023-05-14: qty 5

## 2023-05-14 MED ORDER — FENTANYL CITRATE (PF) 250 MCG/5ML IJ SOLN
INTRAMUSCULAR | Status: DC | PRN
  Administered 2023-05-14 (×4): 25 ug via INTRAVENOUS

## 2023-05-14 MED ORDER — HEPARIN 6000 UNIT IRRIGATION SOLUTION
Status: AC
  Filled 2023-05-14: qty 500

## 2023-05-14 MED ORDER — ORAL CARE MOUTH RINSE: 15.0000 mL | Freq: Once | OROMUCOSAL | Status: AC

## 2023-05-14 SURGICAL SUPPLY — 37 items
ADH SKN CLS APL DERMABOND .7 (GAUZE/BANDAGES/DRESSINGS) ×1
APL PRP STRL LF DISP 70% ISPRP (MISCELLANEOUS) ×1
BAG COUNTER SPONGE SURGICOUNT (BAG) ×1 IMPLANT
BAG DECANTER FOR FLEXI CONT (MISCELLANEOUS) ×1 IMPLANT
BAG SPNG CNTER NS LX DISP (BAG) ×1
CHLORAPREP W/TINT 26 (MISCELLANEOUS) ×1 IMPLANT
COVER SURGICAL LIGHT HANDLE (MISCELLANEOUS) ×1 IMPLANT
DERMABOND ADVANCED .7 DNX12 (GAUZE/BANDAGES/DRESSINGS) ×1 IMPLANT
DRAPE C-ARM 42X120 X-RAY (DRAPES) ×1 IMPLANT
DRAPE CHEST BREAST 15X10 FENES (DRAPES) ×1 IMPLANT
ELECT COATED BLADE 2.86 ST (ELECTRODE) ×1 IMPLANT
ELECT REM PT RETURN 9FT ADLT (ELECTROSURGICAL) ×1 IMPLANT
ELECTRODE REM PT RTRN 9FT ADLT (ELECTROSURGICAL) ×1 IMPLANT
GAUZE 4X4 16PLY ~~LOC~~+RFID DBL (SPONGE) ×1 IMPLANT
GLOVE BIO SURGEON STRL SZ 6 (GLOVE) ×1 IMPLANT
GLOVE INDICATOR 6.5 STRL GRN (GLOVE) ×1 IMPLANT
GOWN STRL REUS W/ TWL LRG LVL3 (GOWN DISPOSABLE) ×1 IMPLANT
GOWN STRL REUS W/ TWL XL LVL3 (GOWN DISPOSABLE) ×1 IMPLANT
GOWN STRL REUS W/TWL LRG LVL3 (GOWN DISPOSABLE) ×1
GOWN STRL REUS W/TWL XL LVL3 (GOWN DISPOSABLE) ×1
KIT BASIN OR (CUSTOM PROCEDURE TRAY) ×1 IMPLANT
KIT PORT INFUSION SMART 8FR (Port) IMPLANT
KIT TURNOVER KIT B (KITS) ×1 IMPLANT
NDL 22X1.5 STRL (OR ONLY) (MISCELLANEOUS) ×1 IMPLANT
NEEDLE 22X1.5 STRL (OR ONLY) (MISCELLANEOUS) ×1 IMPLANT
NS IRRIG 1000ML POUR BTL (IV SOLUTION) ×1 IMPLANT
PENCIL BUTTON HOLSTER BLD 10FT (ELECTRODE) ×1 IMPLANT
POSITIONER HEAD DONUT 9IN (MISCELLANEOUS) ×1 IMPLANT
SPIKE FLUID TRANSFER (MISCELLANEOUS) ×2 IMPLANT
SUT MON AB 4-0 PC3 18 (SUTURE) ×1 IMPLANT
SUT PROLENE 2 0 SH DA (SUTURE) ×2 IMPLANT
SUT VIC AB 3-0 SH 27 (SUTURE) ×1
SUT VIC AB 3-0 SH 27X BRD (SUTURE) ×1 IMPLANT
SYR 5ML LUER SLIP (SYRINGE) ×1 IMPLANT
TOWEL GREEN STERILE (TOWEL DISPOSABLE) ×1 IMPLANT
TRAY LAPAROSCOPIC MC (CUSTOM PROCEDURE TRAY) ×1 IMPLANT
YANKAUER SUCT BULB TIP NO VENT (SUCTIONS) IMPLANT

## 2023-05-14 NOTE — Anesthesia Procedure Notes (Signed)
Procedure Name: LMA Insertion Date/Time: 05/14/2023 2:56 PM  Performed by: Marena Chancy, CRNAPre-anesthesia Checklist: Patient identified, Emergency Drugs available, Suction available and Patient being monitored Patient Re-evaluated:Patient Re-evaluated prior to induction Oxygen Delivery Method: Circle system utilized Preoxygenation: Pre-oxygenation with 100% oxygen Induction Type: IV induction Ventilation: Mask ventilation without difficulty LMA: LMA inserted LMA Size: 4.0 Tube type: Oral Number of attempts: 1 Airway Equipment and Method: Oral airway Placement Confirmation: ETT inserted through vocal cords under direct vision, positive ETCO2 and breath sounds checked- equal and bilateral Tube secured with: Tape Dental Injury: Teeth and Oropharynx as per pre-operative assessment

## 2023-05-14 NOTE — Anesthesia Postprocedure Evaluation (Signed)
Anesthesia Post Note  Patient: Scott [name]  Procedure(s) Performed: INSERTION PORT-A-CATH (Left: Chest)     Patient location during evaluation: PACU Anesthesia Type: General Level of consciousness: awake and alert Pain management: pain level controlled Vital Signs Assessment: post-procedure vital signs reviewed and stable Respiratory status: spontaneous breathing, nonlabored ventilation, respiratory function stable and patient connected to nasal cannula oxygen Cardiovascular status: blood pressure returned to baseline and stable Postop Assessment: no apparent nausea or vomiting Anesthetic complications: no  No notable events documented.  Last Vitals:  Vitals:   05/14/23 1233 05/14/23 1540  BP: (!) 145/75 (!) 160/74  Pulse: (!) 55 80  Resp: 17 20  Temp: 36.5 C 36.7 C  SpO2: 97%     Last Pain:  Vitals:   05/14/23 1540  TempSrc:   PainSc: 0-No pain                 Chance Karam S

## 2023-05-14 NOTE — Op Note (Signed)
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      POSTOPERATIVE DIAGNOSIS:  Same     PROCEDURE: left subclavian port placement, Angiodynamics SmartPort 8 French       SURGEON:  Almond Lint, MD      ANESTHESIA:  General   FINDINGS:  Good venous return, easy flush, and tip of the catheter and   SVC 25.5 cm.      SPECIMEN:  None.      ESTIMATED BLOOD LOSS:  Minimal.      COMPLICATIONS:  None known.      PROCEDURE:  Pt was identified in the holding area and taken to   the operating room, where patient was placed supine on the operating room   table.  General anesthesia was induced.  Patient's arms were tucked and the upper  chest and neck were prepped and draped in sterile fashion.  Time-out was   performed according to the surgical safety check list.  When all was   correct, we continued.     Local anesthetic was administered just below the angle of the left clavicle.  The vein was accessed with 1 pass(es) of the needle. There was good venous return and the wire passed easily with no ectopy.   Fluoroscopy was used to confirm that the wire was in the vena cava.      The patient was placed back level and the area for the pocket was anethetized   with local anesthetic.  A 3-cm transverse incision was made with a #15   blade.  Cautery was used to divide the subcutaneous tissues down to the   pectoralis muscle.  An [employer] retractor was used to elevate the skin   while a pocket was created on top of the pectoralis fascia.  The port   was placed into the pocket to confirm that it was of adequate size.  The   catheter was preattached to the port.  The port was then secured to the   pectoralis fascia with four 2-0 Prolene sutures.  These were clamped and   not tied down yet.    The catheter was tunneled through to the wire exit   site.  The catheter was placed along the wire to determine what length it should be to be in the SVC.  The catheter was cut at 25.5 cm.   The tunneler sheath and dilator were passed over the wire and the dilator and wire were removed.  The catheter was advanced through the tunneler sheath and the tunneler sheath was pulled away.  Care was taken to keep the catheter in the tunneler sheath as this occurred. This was advanced and the tunneler sheath was removed.  There was good venous   return and easy flush of the catheter.  The Prolene sutures were tied   down to the pectoral fascia.  The skin was reapproximated using 3-0   Vicryl interrupted deep dermal sutures.    Fluoroscopy was used to re-confirm good position of the catheter.  The skin   was then closed using 4-0 Monocryl in a subcuticular fashion.  The port was flushed with concentrated heparin flush as well.  The wounds were then cleaned, dried, and dressed with Dermabond.  The patient was awakened from anesthesia and taken to the PACU in stable condition.  Needle, sponge, and instrument counts were correct.               Almond Lint, MD

## 2023-05-14 NOTE — Discharge Instructions (Addendum)
 [facility] ?Office Phone Number [phone] ? ? POST OP INSTRUCTIONS ? ?Always review your discharge instruction sheet given to you by the facility where your surgery was performed. ? ?IF YOU HAVE DISABILITY OR FAMILY LEAVE FORMS, YOU MUST BRING THEM TO THE OFFICE FOR PROCESSING.  DO NOT GIVE THEM TO YOUR DOCTOR. ? ?Take 2 tylenol (acetominophen) three times a day for 3 days.  If you still have pain, add ibuprofen with food in between if able to take this (if you have kidney issues or stomach issues, do not take ibuprofen).  If both of those are not enough, add the narcotic pain pill.  If you find you are needing a lot of this overnight after surgery, call the next morning for a refill.   ?Take your usually prescribed medications unless otherwise directed ?If you need a refill on your pain medication, please contact your pharmacy.  They will contact our office to request authorization.  Prescriptions will not be filled after 5pm or on week-ends. ?You should eat very light the first 24 hours after surgery, such as soup, crackers, pudding, etc.  Resume your normal diet the day after surgery ?It is common to experience some constipation if taking pain medication after surgery.  Increasing fluid intake and taking a stool softener will usually help or prevent this problem from occurring.  A mild laxative (Milk of Magnesia or Miralax) should be taken according to package directions if there are no bowel movements after 48 hours. ?You may shower in 48 hours.  The surgical glue will flake off in 2-3 weeks.   ?ACTIVITIES:  No strenuous activity or heavy lifting for 1 week.   ?You may drive when you no longer are taking prescription pain medication, you can comfortably wear a seatbelt, and you can safely maneuver your car and apply brakes. ?RETURN TO WORK:  __________1 week if applicable. _______________ ?You should see your doctor in the office for a follow-up appointment approximately three-four weeks  after your surgery.   ? ?WHEN TO CALL YOUR DOCTOR: ?Fever over 101.0 ?Nausea and/or vomiting. ?Extreme swelling or bruising. ?Continued bleeding from incision. ?Increased pain, redness, or drainage from the incision. ? ?The clinic staff is available to answer your questions during regular business hours.  Please don?t hesitate to call and ask to speak to one of the nurses for clinical concerns.  If you have a medical emergency, go to the nearest emergency room or call 911.  A surgeon from [facility] is always on call at the hospital. ? ?For further questions, please visit [email or web address]  ? ?

## 2023-05-14 NOTE — Progress Notes (Unsigned)
Patient Care Team: [name], MD as PCP - General [name], [name], MD as PCP - Cardiology (Cardiology) [name], MD as Consulting Physician (Hematology)   CHIEF COMPLAINT: Follow up pancreatic cancer   Oncology History  Pancreatic cancer [facility])  05/01/2023 Cancer Staging   Staging form: Exocrine Pancreas, AJCC 8th Edition - Clinical stage from 05/01/2023: Stage III (cT3, cN2, cM0) - Signed by [name], MD on 05/03/2023   05/02/2023 Initial Diagnosis   Pancreatic cancer (HCC)   05/16/2023 -  Chemotherapy   Patient is on Treatment Plan : PANCREAS Modified FOLFIRINOX q14d x 4 cycles        CURRENT THERAPY: Neoadjuvant chemo FOLFIRINOX q14 days, starting 8/22  INTERVAL HISTORY [name] returns for follow up as scheduled. Last seen by Dr. Mosetta Putt 05/03/23. He saw [name] 8/12 who felt this to be resectable with neoadjuvant chemo, underwent port placement 8/20. Tolerated well except some bruising and bleeding this morning while getting accessed. He had an episode of diarrhea and nausea yesterday that resolved. He has periodic morning nausea for past few years from metformin. He had low BG overnight 69, his meter alarms when low. Was not symptomatic. He is eating and drinking. Bowels moving with colace/miralax. Main complaint is aggravating abdominal pain, worse in the morning. Takes 1/2 tab oxycodone as needed. May take 1/2, than if no relief will take another 1/2 about 30 minutes later, or may go 3-4 hours. He has had 3 half tabs so far today by 11 am, pain is abating.   Denies fever, chills, cough, chest pain, dyspnea, leg edema, or any other specific complaints.  ROS  All other systems reviewed and negative  Past Medical History:  Diagnosis Date   CAD (coronary artery disease)    Cerebrovascular disease    Diabetes mellitus without complication (HCC)    HLD (hyperlipidemia)    HTN (hypertension)    IBS (irritable bowel syndrome)    Patient states did not have   Lumbar  stenosis    MI, old    Persistent atrial fibrillation (HCC)    PVD (peripheral vascular disease) (HCC)    Stroke (cerebrum) (HCC)    Tobacco abuse      Past Surgical History:  Procedure Laterality Date   ABDOMINAL AORTOGRAM W/LOWER EXTREMITY N/A 01/25/2017   Procedure: Abdominal Aortogram w/Lower Extremity;  Surgeon: Sherren Kerns, MD;  Location: [facility] INVASIVE CV LAB;  Service: Cardiovascular;  Laterality: N/A;   ANTERIOR CRUCIATE LIGAMENT REPAIR     CARDIAC CATHETERIZATION     CONE   CARDIAC CATHETERIZATION N/A 07/27/2016   Procedure: Left Heart Cath and Coronary Angiography;  Surgeon: Tonny Bollman, MD;  Location: [facility] INVASIVE CV LAB;  Service: Cardiovascular;  Laterality: N/A;   CARDIOVERSION N/A 06/29/2016   Procedure: CARDIOVERSION;  Surgeon: Pricilla Riffle, MD;  Location: [facility] ENDOSCOPY;  Service: Cardiovascular;  Laterality: N/A;   CARDIOVERSION N/A 03/05/2018   Procedure: CARDIOVERSION;  Surgeon: Laurey Morale, MD;  Location: [facility] ENDOSCOPY;  Service: Cardiovascular;  Laterality: N/A;   CHOLECYSTECTOMY     ENDARTERECTOMY Right 12/06/2017   Procedure: ENDARTERECTOMY CAROTID RIGHT;  Surgeon: Nada Libman, MD;  Location: [facility] OR;  Service: Vascular;  Laterality: Right;   ESOPHAGOGASTRODUODENOSCOPY (EGD) WITH PROPOFOL N/A 05/01/2023   Procedure: ESOPHAGOGASTRODUODENOSCOPY (EGD) WITH PROPOFOL;  Surgeon: Willis Modena, MD;  Location: [facility] ENDOSCOPY;  Service: Gastroenterology;  Laterality: N/A;   FINE NEEDLE ASPIRATION N/A 05/01/2023   Procedure: FINE NEEDLE ASPIRATION (FNA) LINEAR;  Surgeon: Willis Modena, MD;  Location: [facility] ENDOSCOPY;  Service: Gastroenterology;  Laterality: N/A;   KNEE SURGERY Bilateral 1982, 1988, 1997, 2000   x 4 times.   LOWER EXTREMITY ANGIOGRAPHY N/A 07/03/2019   Procedure: LOWER EXTREMITY ANGIOGRAPHY;  Surgeon: Sherren Kerns, MD;  Location: [facility] INVASIVE CV LAB;  Service: Cardiovascular;  Laterality: N/A;   LUMBAR LAMINECTOMY/DECOMPRESSION  MICRODISCECTOMY Bilateral 09/09/2018   Procedure: Laminectomy and Foraminotomy bilateral Lumbar three-Lumbar four - Lumbar four-Lumbar five;  Surgeon: Julio Sicks, MD;  Location: [facility] OR;  Service: Neurosurgery;  Laterality: Bilateral;   PATCH ANGIOPLASTY Right 12/06/2017   Procedure: PATCH ANGIOPLASTY RIGHT CAROTID ARTERY;  Surgeon: Nada Libman, MD;  Location: [facility] OR;  Service: Vascular;  Laterality: Right;   PERIPHERAL VASCULAR INTERVENTION Right 01/25/2017   Procedure: Peripheral Vascular Intervention;  Surgeon: Sherren Kerns, MD;  Location: [facility] INVASIVE CV LAB;  Service: Cardiovascular;  Laterality: Right;   PERIPHERAL VASCULAR INTERVENTION Right 07/03/2019   Procedure: PERIPHERAL VASCULAR INTERVENTION;  Surgeon: Sherren Kerns, MD;  Location: [facility] INVASIVE CV LAB;  Service: Cardiovascular;  Laterality: Right;   PORTACATH PLACEMENT Left 05/14/2023   Procedure: INSERTION PORT-A-CATH;  Surgeon: Almond Lint, MD;  Location: [facility] OR;  Service: General;  Laterality: Left;   TONSILLECTOMY     UPPER ESOPHAGEAL ENDOSCOPIC ULTRASOUND (EUS) Bilateral 05/01/2023   Procedure: UPPER ESOPHAGEAL ENDOSCOPIC ULTRASOUND (EUS);  Surgeon: Willis Modena, MD;  Location: [facility] ENDOSCOPY;  Service: Gastroenterology;  Laterality: Bilateral;     Outpatient Encounter Medications as of 05/16/2023  Medication Sig Note   amLODipine (NORVASC) 5 MG tablet Take 5 mg by mouth at bedtime.    apixaban (ELIQUIS) 5 MG TABS tablet Take 1 tablet (5 mg total) by mouth 2 (two) times daily. Please keep upcoming appointment. Thanks.    aspirin EC 81 MG tablet Take 81 mg by mouth daily.    diazepam (VALIUM) 2 MG tablet Take 2 mg by mouth 2 (two) times daily as needed for anxiety.    docusate sodium (COLACE) 100 MG capsule Take 200 mg by mouth daily.    esomeprazole (NEXIUM) 40 MG capsule Take 40 mg by mouth daily as needed (heartburn/hiccups).    fenofibrate (TRICOR) 48 MG tablet Take 48 mg by mouth daily.    gabapentin  (NEURONTIN) 300 MG capsule Take 900 mg by mouth 2 (two) times daily.    glipiZIDE (GLUCOTROL XL) 5 MG 24 hr tablet Take 5 mg by mouth daily after supper.    hyoscyamine (LEVSIN SL) 0.125 MG SL tablet Place 0.125 mg under the tongue every 4 (four) hours as needed for cramping.    Insulin Glargine (BASAGLAR KWIKPEN) 100 UNIT/ML Inject 20 Units into the skin daily.    JARDIANCE 25 MG TABS tablet Take 25 mg by mouth daily.    lidocaine-prilocaine (EMLA) cream Apply to affected area once 05/07/2023: Hasn't started   metFORMIN (GLUCOPHAGE-XR) 500 MG 24 hr tablet Take 1,500 mg by mouth daily after supper.    ondansetron (ZOFRAN) 4 MG tablet Take 4 mg by mouth every 8 (eight) hours as needed for nausea or vomiting.    ondansetron (ZOFRAN) 8 MG tablet Take 1 tablet (8 mg total) by mouth every 8 (eight) hours as needed for nausea or vomiting. Start on the third day after cisplatin 05/07/2023: Hasn't started   oxyCODONE (OXY IR/ROXICODONE) 5 MG immediate release tablet Take 1-2 tablets (5-10 mg total) by mouth every 6 (six) hours as needed for up to 7 days for severe pain.  polyethylene glycol (MIRALAX / GLYCOLAX) 17 g packet Take 17 g by mouth daily as needed for moderate constipation.    prochlorperazine (COMPAZINE) 10 MG tablet Take 1 tablet (10 mg total) by mouth every 6 (six) hours as needed for nausea or vomiting (Nausea or vomiting). 05/07/2023: Hasn't started   rosuvastatin (CRESTOR) 40 MG tablet Take 40 mg by mouth daily.    sildenafil (VIAGRA) 100 MG tablet Take 100 mg by mouth as needed for erectile dysfunction.    spironolactone (ALDACTONE) 25 MG tablet Take 25 mg by mouth daily.    valsartan (DIOVAN) 320 MG tablet Take 320 mg by mouth daily.    [DISCONTINUED] HYDROcodone-acetaminophen (NORCO/VICODIN) 5-325 MG tablet Take 1-2 tablets by mouth every 6 (six) hours as needed (for pain). (Patient taking differently: Take 0.5 tablets by mouth every 4 (four) hours as needed (for pain).) 05/07/2023: Pt  confirms he's taking oxycodone and hydrocodone. Pt takes oxycodone for severe pain and hydrocodone for moderate pain   [DISCONTINUED] oxyCODONE-acetaminophen (PERCOCET/ROXICET) 5-325 MG tablet Take 1-2 tablets by mouth 3 (three) times daily as needed for moderate pain. 05/07/2023: Pt confirms he's taking oxycodone and hydrocodone. Pt takes oxycodone for severe pain and hydrocodone for moderate pain. Oxycodone is a short term thing, pt plans to cotninue with hydrocodone after this current issues     Facility-Administered Encounter Medications as of 05/16/2023  Medication   [COMPLETED] sodium chloride flush (NS) 0.9 % injection 10 mL   [DISCONTINUED] Chlorhexidine Gluconate Cloth 2 % PADS 6 each   [DISCONTINUED] Chlorhexidine Gluconate Cloth 2 % PADS 6 each   [DISCONTINUED] fentaNYL (SUBLIMAZE) injection 25-50 mcg   [DISCONTINUED] insulin aspart (novoLOG) injection 0-14 Units   [DISCONTINUED] lactated ringers infusion   [DISCONTINUED] ondansetron (ZOFRAN) injection 4 mg   [DISCONTINUED] oxyCODONE (Oxy IR/ROXICODONE) immediate release tablet 5 mg   [DISCONTINUED] oxyCODONE (ROXICODONE) 5 MG/5ML solution 5 mg     Today's Vitals   05/16/23 1000 05/16/23 1023  BP:  (!) 148/70  Pulse:  60  Resp:  18  Temp:  98.3 F (36.8 C)  TempSrc:  Oral  SpO2:  100%  PainSc: 6  5    There is no height or weight on file to calculate BMI.   PHYSICAL EXAM GENERAL:alert, no distress and comfortable SKIN: no rash  EYES: sclera clear NECK: without mass LYMPH:  no palpable cervical or supraclavicular lymphadenopathy  LUNGS: clear with normal breathing effort HEART: regular rate & rhythm, no lower extremity edema ABDOMEN: abdomen soft, non-tender and normal bowel sounds NEURO: alert & oriented x 3 with fluent speech, no focal motor/sensory deficits PAC covered with gauze with small amount of blood on it, ecchymosis surrounding dressing border     CBC    Component Value Date/Time   WBC 10.3 05/16/2023  0913   WBC 8.5 05/01/2023 1126   RBC 5.10 05/16/2023 0913   HGB 15.0 05/16/2023 0913   HGB 16.6 10/23/2017 0933   HCT 44.4 05/16/2023 0913   HCT 48.6 10/23/2017 0933   PLT 239 05/16/2023 0913   PLT 338 10/23/2017 0933   MCV 87.1 05/16/2023 0913   MCV 87 10/23/2017 0933   MCH 29.4 05/16/2023 0913   MCHC 33.8 05/16/2023 0913   RDW 14.0 05/16/2023 0913   RDW 13.8 10/23/2017 0933   LYMPHSABS 1.2 05/16/2023 0913   LYMPHSABS 2.6 10/23/2017 0933   MONOABS 0.9 05/16/2023 0913   EOSABS 0.1 05/16/2023 0913   EOSABS 0.1 10/23/2017 0933   BASOSABS 0.0 05/16/2023 0913  BASOSABS 0.0 10/23/2017 0933     CMP     Component Value Date/Time   NA 138 05/16/2023 0913   NA 140 01/17/2021 1402   K 4.0 05/16/2023 0913   CL 105 05/16/2023 0913   CO2 24 05/16/2023 0913   GLUCOSE 158 (H) 05/16/2023 0913   BUN 22 05/16/2023 0913   BUN 25 01/17/2021 1402   CREATININE 0.79 05/16/2023 0913   CREATININE 1.05 07/23/2016 0929   CALCIUM 9.0 05/16/2023 0913   PROT 6.8 05/16/2023 0913   PROT 6.5 10/23/2017 0933   ALBUMIN 3.8 05/16/2023 0913   ALBUMIN 4.2 10/23/2017 0933   AST 68 (H) 05/16/2023 0913   ALT 107 (H) 05/16/2023 0913   ALKPHOS 103 05/16/2023 0913   BILITOT 0.7 05/16/2023 0913   GFRNONAA >60 05/16/2023 0913   GFRAA >60 09/03/2018 1527     ASSESSMENT & PLAN: 72 year old male  Pancreatic cancer; cT3N2M0, stage III, likely resectable -Patient presented with abdominal pain and weight loss, CT scan revealed a 4.4 x 3.5 cm ill-defined mass in the pancreatic tail, which 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  -Seen by [name] who felt this to be likely resectable with neoadjuvant chemo.   -If he undergoes surgery this would entail distal pancreatectomy and splenectomy. We can facilitate splenectomy vaccines as appropriate -Port placed 8/20 which has significant  bruising today, he is on Eliquis.   -The recommendation is neoadjuvant FOLFIRINOX q2 weeks, starting today -Mr. Shareef appears stable.  He has mild to moderate abdominal pain which is partially managed with oxycodone as needed.  Adequate performance status -we reviewed the potential side effect, symptom management, rationale, and curative goal of neoadjuvant chemo.  He is ready to proceed -Labs reviewed, adequate for cycle 1 FOLFIRINOX today as scheduled -Phone follow-up next week for toxicity check    Cancer related pain -Patient has chronic back pain, on hydrocodone per PCP -His abdominal pain is related to his pancreatic cancer -Recently prescribed oxycodone 5 mg q6h PRN, takes 1/2 tab as needed, but could be anywhere from 30 minutes to several hours apart. I reviewed dosing schedule and policy, encouraged to take as prescribed -Reviewed palliative care service, he declined for now  Nausea/diarrhea -Has baseline AM nausea secondary to metformin -had an episode of diarrhea and nausea yesterday, day after port placement -usually more constipated on narcotics, using colace and miralax PRN.  -will monitor on chemo, reviewed symptom management if SE's return  Transaminitis -does not drink alcohol, on a statin and oxycodone -No RUQ ttp -PET scan is pending -OK to treat today   PLAN: -Treatment plan, side effects, symptom management, goal, and today's labs reviewed -Discussed pain regimen, encouraged to take as prescribed -Proceed with C1 FOLFIRINOX today as scheduled -PET scan 8/26 -Toxicity check next week -F/up and C2 in 2 weeks    All questions were answered. The patient knows to call the clinic with any problems, questions or concerns. No barriers to learning were detected. I spent 20 minutes counseling the patient face to face. The total time spent in the appointment was 30 minutes and more than 50% was on counseling, review of test results, and coordination of care.   Santiago Glad, NP-C 05/16/2023

## 2023-05-14 NOTE — Interval H&P Note (Signed)
History and Physical Interval Note:  05/14/2023 1:08 PM  Scott [name]  has presented today for surgery, with the diagnosis of PANCREATIC CANCER.  The various methods of treatment have been discussed with the patient and family. After consideration of risks, benefits and other options for treatment, the patient has consented to  Procedure(s): INSERTION PORT-A-CATH (N/A) as a surgical intervention.  [name] history has been reviewed, patient examined, no change in status, stable for surgery.  I have reviewed the patient's chart and labs.  Questions were answered to the patient's satisfaction.     Almond Lint

## 2023-05-14 NOTE — Transfer of Care (Cosign Needed)
Immediate Anesthesia Transfer of Care Note  Patient: Scott [name]  Procedure(s) Performed: INSERTION PORT-A-CATH (Left: Chest)  Patient Location: PACU  Anesthesia Type:General  Level of Consciousness: awake  Airway & Oxygen Therapy: Patient Spontanous Breathing and Patient connected to face mask oxygen  Post-op Assessment: Report given to RN and Post -op Vital signs reviewed and stable  Post vital signs: Reviewed and stable  Last Vitals:  Vitals Value Taken Time  BP 162/76 05/14/23 1545  Temp 36.7 C 05/14/23 1540  Pulse 81 05/14/23 1547  Resp 14 05/14/23 1547  SpO2 98 % 05/14/23 1547  Vitals shown include unfiled device data.  Last Pain:  Vitals:   05/14/23 1540  TempSrc:   PainSc: 0-No pain         Complications: No notable events documented.

## 2023-05-14 NOTE — H&P (Signed)
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 The patient presented with a prodrome of weight loss and weakness as well as upper abdominal pain and back pain. The patient says that some of this was hard to tease out as he had a significant back surgery several years back in 2019 and had significant difficulty after that. He did get better initially after surgery, but then he developed leg weakness and decreased activity. He got significantly depressed during that time and lost a fair amount of weight in 2020 and 2021. He seen neurology and did start going back to working out 2-3 times most weeks. He has been doing weight lifting and resistance type exercises to improve his muscle mass. This was working until this year. He describes noticing upper abdominal pain after returning from the beach over 4 July. It was nagging and uncomfortable and he mentioned it to his wife several times. When this did not improve, he went to see his primary care doctor. Dr. Tiburcio Pea ordered a CT scan and found a mass present. He already was on hydrocodone for chronic back pain. He has required an increase to oxycodone in order to control his pain. He has some back pain in addition to the upper abdominal pain. He has been trying to take a half a pill at a time to decrease the amount of medication that he is taking, but he does end up needing about 3 to 5 pills/day at least 2 be able to manage normal activities. He has started to have some constipation which is new for him.  He has had some decreased activity over the past week or 2, but before that his appetite seemed normal to him.  He is diabetic, but developed diabetes and the time around his back surgery. His A1c went up and he now is requiring some insulin at this point. He was started on insulin last fall.  Imaging:   CT abd/pelvis w contrast 04/18/2023 FINDINGS: Lower Chest: No acute findings.  Hepatobiliary: No suspicious hepatic masses identified. Prior cholecystectomy. Mild diffuse biliary ductal dilatation is seen with common bile duct measuring up to 12 mm. This may be related to post cholecystectomy state.  Pancreas: An ill-defined low-attenuation mass is seen in the pancreatic tail measuring 4.4 x 3.5 cm on image 23/2, highly suspicious for pancreatic carcinoma. This mass encases the proximal splenic artery and causes splenic vein thrombosis.  Spleen: Within normal limits in size and appearance.  Adrenals/Urinary Tract: No suspicious masses identified. Mild left renal parenchymal scarring noted. No evidence of ureteral calculi or hydronephrosis.  Stomach/Bowel: No evidence of obstruction, inflammatory process or abnormal fluid collections.  Vascular/Lymphatic: No pathologically enlarged lymph nodes. No acute vascular findings.  Reproductive: No mass or other significant abnormality.  Other: None.  Musculoskeletal: No suspicious bone lesions identified.  IMPRESSION: 4.4 cm ill-defined mass in the pancreatic tail, highly suspicious for pancreatic carcinoma.  No evidence of abdominal or pelvic metastatic disease.  Mild diffuse biliary ductal dilatation, which may be due to post cholecystectomy state. Recommend correlation with liver function tests, and consider abdomen MRI/MRCP without and with contrast for further evaluation.  Ct chest lung cancer screening 02/06/2023  IMPRESSION: 1. Lung-RADS 2S, benign appearance or behavior. Continue annual screening with low-dose chest CT without contrast in 12 months. 2. The "S" modifier above refers to potentially clinically significant  non lung cancer related findings. Specifically, there is aortic atherosclerosis, in addition to left main and three-vessel coronary artery disease. Please note that although the presence of coronary  artery calcium documents the presence of coronary artery disease, the severity of this disease and any potential stenosis cannot be assessed on this non-gated CT examination. Assessment for potential risk factor modification, dietary therapy or pharmacologic therapy may be warranted, if clinically indicated. 3. Mild diffuse bronchial wall thickening with mild centrilobular and paraseptal emphysema; imaging findings suggestive of underlying COPD. 4. There are calcifications of the aortic valve. Echocardiographic correlation for evaluation of potential valvular dysfunction may be warranted if clinically indicated.  Aortic Atherosclerosis (ICD10-I70.0) and Emphysema (ICD10-J43.9).   EUS: 05/01/2023 Outlaw round mass was identified in the pancreatic body. The mass was hypoechoic. The mass measured at least 30 mm by 25 mm in maximal cross- sectional diameter. The endosonographic borders were poorly- defined. There was sonographic evidence suggesting invasion into the splenic artery ( manifested by invasion) and the splenic vein ( manifested by invasion) . Evidence of thrombosis/ plaque in splenic artery/ vein. The remainder of the pancreas was examined. The endosonographic appearance of parenchyma and the upstream pancreatic duct indicated duct dilation. - There was no evidence of significant pathology in the left lobe of the liver. - A few lymph nodes were visualized and measured in the peripancreatic region. - There was no sign of significant pathology in the ampulla. - A mass was identified in the pancreatic body. This was staged T3 N2 Mx by endosonographic criteria. Fine needle aspiration performed.  Past Medical History:  Diagnosis Date  Diabetes mellitus without complication (CMS/HHS-HCC)  GERD (gastroesophageal reflux disease)  Heart disease  AFib  History of cancer  Hyperlipidemia  Hypertension  Osteoarthritis  Sleep apnea   Past Surgical History:  Procedure Laterality Date  BACK  SURGERY  CHOLECYSTECTOMY  CORONARY ANGIOPLASTY  KNEE ARTHROSCOPY  OPERATIVE TISSUE ABLATION AND RECONSTRUCTION ATRIA PERFORMED WITH OTHER CARDIAC PROCEDURE Bilateral   Current Outpatient Medications  Medication Sig Dispense Refill  fenofibrate nanocrystallized (TRICOR) 48 MG tablet Take by mouth  glipiZIDE (GLUCOTROL XL) 5 MG XL tablet Take by mouth  ACCU-CHEK FASTCLIX LANCET DRUM USE TO CHECK BS READING ONCE A DAY  ACCU-CHEK GUIDE TEST STRIPS test strip CHECK BLOOD SUGARS ONCE A DAY  amLODIPine (NORVASC) 10 MG tablet Take by mouth  apixaban (ELIQUIS) 5 mg tablet TAKE 1 TABLET(5 MG) BY MOUTH TWICE DAILY  atorvastatin (LIPITOR) 40 MG tablet  carboxymethylce-glycern-poly80 0.5-1-0.5 % Drop Apply to eye  clindamycin (CLEOCIN) 150 MG capsule Take 1 mg by mouth 4 (four) times daily  cyclobenzaprine (FLEXERIL) 10 MG tablet TK 1 T PO TID PRF MSP  diazePAM (VALIUM) 2 MG tablet TAKE 1 TABLET AS NEEDED TWICE A DAY FOR ANXIETY OR MUSCLE SPASM  esomeprazole (NEXIUM) 40 MG DR capsule PRN  fenofibrate 120 mg Tab  gabapentin (NEURONTIN) 300 MG capsule 1 tab q hsx5 days,1 tab bidx5days,1 tab bid&2 tabs q hsx5days,2 tabs q am,1 tab q pm&2 tabs q hsx5days, then 2 tabs 3 times a day 180 capsule 2  HYDROcodone-acetaminophen (NORCO) 5-325 mg tablet Take by mouth  losartan (COZAAR) 100 MG tablet Take by mouth  metFORMIN (GLUCOPHAGE-XR) 500 MG XR tablet  potassium chloride (KLOR-CON) 10 mEq ER tablet Take by mouth  sildenafiL (VIAGRA) 100 MG tablet Take by mouth  spironolactone (ALDACTONE) 25 MG tablet TAKE 1/2 TO 1 TABLET BY MOUTH EVERY DAY  valsartan (DIOVAN) 320 MG tablet Take 1 mg by  mouth once daily   No current facility-administered medications for this visit.   Allergies  Allergen Reactions  Augmentin [Amoxicillin-Pot Clavulanate] Hives and Itching   Family History  Problem Relation Name Age of Onset  Diabetes Mother Shirlee Limerick  Diabetes type II Mother Shirlee Limerick  High blood pressure (Hypertension)  Mother Shirlee Limerick  Hyperlipidemia (Elevated cholesterol) Mother Shirlee Limerick  Kidney disease Mother Shirlee Limerick  Skin cancer Father Fayrene Fearing  Coronary Artery Disease (Blocked arteries around heart) Father Fayrene Fearing  Heart disease Father Fayrene Fearing  High blood pressure (Hypertension) Father Fayrene Fearing  Hyperlipidemia (Elevated cholesterol) Father Fayrene Fearing   Social History   Socioeconomic History  Marital status: Married  Tobacco Use  Smoking status: Every Day  Current packs/day: 0.50  Average packs/day: 0.5 packs/day for 52.2 years (26.1 ttl pk-yrs)  Types: Cigarettes  Start date: 03/04/1971  Smokeless tobacco: Never  Tobacco comments:  Trying to quit - have reduced...  Vaping Use  Vaping status: Never Used  Substance and Sexual Activity  Alcohol use: Not Currently  Comment: Rare glass of wine or a beer... maybe 10-15 in a year.  Drug use: Not Currently  Comment: Marijuana in college, younger days  Sexual activity: Yes  Partners: Female  Birth control/protection: None  Comment: Wife is 67, so...   REVIEW OF SYSTEMS - PERTINENT POSITIVES ONLY: A complete review of systems negative other than HPI and PMH except for weight loss, nausea, abdominal pain, constipation, weakness.  EXAM: Vitals:  05/06/23 1202  BP: (!) 154/74  Pulse: 86  Temp: 36.5 C (97.7 F)   Wt Readings from Last 3 Encounters:  05/06/23 68.8 kg (151 lb 9.6 oz)  03/23/20 83.5 kg (184 lb)  12/31/19 83 kg (182 lb 15.7 oz)   Gen: No acute distress. Well nourished and well groomed.  Neurological: Alert and oriented to person, place, and time. Coordination normal.  Head: Normocephalic and atraumatic.  Eyes: Conjunctivae are normal. Pupils are equal, round, and reactive to light. No scleral icterus.  Neck: Normal range of motion. Neck supple. No tracheal deviation or thyromegaly present.  Cardiovascular: Normal rate, regular rhythm, normal heart sounds and intact distal pulses. Exam reveals no gallop and no friction rub. No murmur  heard. Respiratory: Effort normal. No respiratory distress. No chest wall tenderness. Breath sounds normal. No wheezes, rales or rhonchi.  GI: Soft. Bowel sounds are normal. The abdomen is soft. No hepatosplenomegaly. Mild epigastric tenderness. There is no rebound and no guarding.  Musculoskeletal: Normal range of motion. Extremities are nontender.  Lymphadenopathy: No cervical, preauricular, postauricular or axillary adenopathy is present Skin: Skin is warm and dry. No rash noted. No diaphoresis. No erythema. No pallor. No clubbing, cyanosis, or edema.  Psychiatric: Normal mood and affect. Behavior is normal. Judgment and thought content normal.   LABORATORY RESULTS: Available labs are reviewed  May 01, 2023 CMET with albumin 3.5, AST 144, ALT 156. Otherwise essentially normal. CBC normal CA 19-9 872  Cytology 04/23/2023 FINAL MICROSCOPIC DIAGNOSIS:  - Adenocarcinoma   RADIOLOGY RESULTS: See above.  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 In reviewing his images.  This does appear to be relatively proximal. Because of the vascular invasion, elevated CA 19-9, and size of the tumor, I do recommend neoadjuvant chemotherapy. I reviewed port placement with the patient. We are going to schedule this ASAP. I reviewed risks of surgery including bleeding, infection, malposition, pneumothorax, possible need for additional surgeries, possible other  heart or lung complications, possible blood clot.  He received cardiac clearance in June for endoscopic ultrasound, so I we will go ahead and send orders. However, I have sent a message to his cardiologist regarding cardiac restratification for distal pancreatectomy. I do think this would be an open operation and would take several hours to complete. This would certainly be higher risk compared to a short outpatient procedure.  I did briefly review the anatomy of surgery, but did not go into depth regarding distal pancreatectomy and splenectomy. I will contact Dr. Mosetta Putt about potentially getting his splenectomy vaccines at the cancer center.  I am hopeful that the neoadjuvant chemotherapy may slightly improve his upper abdominal pain, but I did advise the patient that he may not experience any significant change in that complaint. This is slightly concerning for risk of involvement of the celiac plexus with tumor. We will see how he fares with this.  He will be working with nutrition at the cancer center to make sure his weight stays stable and possibly gain some more weight prior to definitive surgery.

## 2023-05-15 ENCOUNTER — Other Ambulatory Visit: Payer: Self-pay | Admitting: *Deleted

## 2023-05-15 ENCOUNTER — Encounter (HOSPITAL_COMMUNITY): Payer: Self-pay | Admitting: General Surgery

## 2023-05-15 MED FILL — Fosaprepitant Dimeglumine For IV Infusion 150 MG (Base Eq): INTRAVENOUS | Qty: 5 | Status: AC

## 2023-05-15 MED FILL — Dexamethasone Sodium Phosphate Inj 100 MG/10ML: INTRAMUSCULAR | Qty: 1 | Status: AC

## 2023-05-15 NOTE — Progress Notes (Signed)
The proposed treatment discussed in conference is for discussion purpose only and is not a binding recommendation.  The patients have not been physically examined, or presented with their treatment options.  Therefore, final treatment plans cannot be decided.  

## 2023-05-16 ENCOUNTER — Inpatient Hospital Stay (HOSPITAL_BASED_OUTPATIENT_CLINIC_OR_DEPARTMENT_OTHER): Payer: Medicare Other | Admitting: Nurse Practitioner

## 2023-05-16 ENCOUNTER — Inpatient Hospital Stay: Payer: Medicare Other

## 2023-05-16 ENCOUNTER — Encounter: Payer: Self-pay | Admitting: Nurse Practitioner

## 2023-05-16 DIAGNOSIS — C252 Malignant neoplasm of tail of pancreas: Secondary | ICD-10-CM

## 2023-05-16 DIAGNOSIS — Z5111 Encounter for antineoplastic chemotherapy: Secondary | ICD-10-CM | POA: Diagnosis present

## 2023-05-16 DIAGNOSIS — K8689 Other specified diseases of pancreas: Secondary | ICD-10-CM

## 2023-05-16 DIAGNOSIS — Z79899 Other long term (current) drug therapy: Secondary | ICD-10-CM | POA: Diagnosis not present

## 2023-05-16 DIAGNOSIS — Z95828 Presence of other vascular implants and grafts: Secondary | ICD-10-CM | POA: Insufficient documentation

## 2023-05-16 LAB — CBC WITH DIFFERENTIAL (CANCER CENTER ONLY)
Abs Immature Granulocytes: 0.02 10*3/uL (ref 0.00–0.07)
Basophils Absolute: 0 10*3/uL (ref 0.0–0.1)
Basophils Relative: 0 %
Eosinophils Absolute: 0.1 10*3/uL (ref 0.0–0.5)
Eosinophils Relative: 1 %
HCT: 44.4 % (ref 39.0–52.0)
Hemoglobin: 15 g/dL (ref 13.0–17.0)
Immature Granulocytes: 0 %
Lymphocytes Relative: 12 %
Lymphs Abs: 1.2 10*3/uL (ref 0.7–4.0)
MCH: 29.4 pg (ref 26.0–34.0)
MCHC: 33.8 g/dL (ref 30.0–36.0)
MCV: 87.1 fL (ref 80.0–100.0)
Monocytes Absolute: 0.9 10*3/uL (ref 0.1–1.0)
Monocytes Relative: 9 %
Neutro Abs: 8 10*3/uL — ABNORMAL HIGH (ref 1.7–7.7)
Neutrophils Relative %: 78 %
Platelet Count: 239 10*3/uL (ref 150–400)
RBC: 5.1 MIL/uL (ref 4.22–5.81)
RDW: 14 % (ref 11.5–15.5)
WBC Count: 10.3 10*3/uL (ref 4.0–10.5)
nRBC: 0 % (ref 0.0–0.2)

## 2023-05-16 LAB — CMP (CANCER CENTER ONLY)
ALT: 107 U/L — ABNORMAL HIGH (ref 0–44)
AST: 68 U/L — ABNORMAL HIGH (ref 15–41)
Albumin: 3.8 g/dL (ref 3.5–5.0)
Alkaline Phosphatase: 103 U/L (ref 38–126)
Anion gap: 9 (ref 5–15)
BUN: 22 mg/dL (ref 8–23)
CO2: 24 mmol/L (ref 22–32)
Calcium: 9 mg/dL (ref 8.9–10.3)
Chloride: 105 mmol/L (ref 98–111)
Creatinine: 0.79 mg/dL (ref 0.61–1.24)
GFR, Estimated: >60 mL/min (ref >60)
Glucose, Bld: 158 mg/dL — ABNORMAL HIGH (ref 70–99)
Potassium: 4 mmol/L (ref 3.5–5.1)
Sodium: 138 mmol/L (ref 135–145)
Total Bilirubin: 0.7 mg/dL (ref 0.3–1.2)
Total Protein: 6.8 g/dL (ref 6.5–8.1)

## 2023-05-16 MED ORDER — PALONOSETRON HCL INJECTION 0.25 MG/5ML
0.2500 mg | Freq: Once | INTRAVENOUS | Status: AC
  Administered 2023-05-16: 0.25 mg via INTRAVENOUS
  Filled 2023-05-16: qty 5

## 2023-05-16 MED ORDER — SODIUM CHLORIDE 0.9% FLUSH
10.0000 mL | Freq: Once | INTRAVENOUS | Status: AC
  Administered 2023-05-16: 10 mL

## 2023-05-16 MED ORDER — DEXTROSE 5 % IV SOLN: Freq: Once | INTRAVENOUS | Status: AC

## 2023-05-16 MED ORDER — SODIUM CHLORIDE 0.9 % IV SOLN
150.0000 mg | Freq: Once | INTRAVENOUS | Status: AC
  Administered 2023-05-16: 150 mg via INTRAVENOUS
  Filled 2023-05-16: qty 150

## 2023-05-16 MED ORDER — SODIUM CHLORIDE 0.9 % IV SOLN
10.0000 mg | Freq: Once | INTRAVENOUS | Status: AC
  Administered 2023-05-16: 10 mg via INTRAVENOUS
  Filled 2023-05-16: qty 10

## 2023-05-16 MED ORDER — ATROPINE SULFATE 1 MG/ML IV SOLN
0.5000 mg | Freq: Once | INTRAVENOUS | Status: AC | PRN
  Administered 2023-05-16: 0.5 mg via INTRAVENOUS
  Filled 2023-05-16: qty 1

## 2023-05-16 MED ORDER — SODIUM CHLORIDE 0.9 % IV SOLN
150.0000 mg/m2 | Freq: Once | INTRAVENOUS | Status: AC
  Administered 2023-05-16: 300 mg via INTRAVENOUS
  Filled 2023-05-16: qty 15

## 2023-05-16 MED ORDER — SODIUM CHLORIDE 0.9 % IV SOLN
2400.0000 mg/m2 | INTRAVENOUS | Status: DC
  Administered 2023-05-16: 4450 mg via INTRAVENOUS
  Filled 2023-05-16: qty 89

## 2023-05-16 MED ORDER — SODIUM CHLORIDE 0.9 % IV SOLN
400.0000 mg/m2 | Freq: Once | INTRAVENOUS | Status: AC
  Administered 2023-05-16: 744 mg via INTRAVENOUS
  Filled 2023-05-16: qty 37.2

## 2023-05-16 MED ORDER — OXALIPLATIN CHEMO INJECTION 100 MG/20ML
85.0000 mg/m2 | Freq: Once | INTRAVENOUS | Status: AC
  Administered 2023-05-16: 150 mg via INTRAVENOUS
  Filled 2023-05-16: qty 30

## 2023-05-16 NOTE — Progress Notes (Signed)
Per Santiago Glad, NP, okay to treat with elevated LFT's.  Per Mosetta Putt, MD, okay to run 5FU over 44hrs.

## 2023-05-16 NOTE — Patient Instructions (Signed)
[place] [facility]  Discharge Instructions: Thank you for choosing [facility] to provide your oncology and hematology care.   If you have a lab appointment with the Cancer Center, please go directly to the Cancer Center and check in at the registration area.   Wear comfortable clothing and clothing appropriate for easy access to any Portacath or PICC line.   We strive to give you quality time with your provider. You may need to reschedule your appointment if you arrive late (15 or more minutes).  Arriving late affects you and other patients whose appointments are after yours.  Also, if you miss three or more appointments without notifying the office, you may be dismissed from the clinic at the provider's discretion.      For prescription refill requests, have your pharmacy contact our office and allow 72 hours for refills to be completed.    Today you received the following chemotherapy and/or immunotherapy agents Oxaliplatin, Leukovorin, Irinotecan, 5FU      To help prevent nausea and vomiting after your treatment, we encourage you to take your nausea medication as directed.  BELOW ARE SYMPTOMS THAT SHOULD BE REPORTED IMMEDIATELY: *FEVER GREATER THAN 100.4 F (38 C) OR HIGHER *CHILLS OR SWEATING *NAUSEA AND VOMITING THAT IS NOT CONTROLLED WITH YOUR NAUSEA MEDICATION *UNUSUAL SHORTNESS OF BREATH *UNUSUAL BRUISING OR BLEEDING *URINARY PROBLEMS (pain or burning when urinating, or frequent urination) *BOWEL PROBLEMS (unusual diarrhea, constipation, pain near the anus) TENDERNESS IN MOUTH AND THROAT WITH OR WITHOUT PRESENCE OF ULCERS (sore throat, sores in mouth, or a toothache) UNUSUAL RASH, SWELLING OR PAIN  UNUSUAL VAGINAL DISCHARGE OR ITCHING   Items with * indicate a potential emergency and should be followed up as soon as possible or go to the Emergency Department if any problems should occur.  Please show the CHEMOTHERAPY ALERT CARD or  IMMUNOTHERAPY ALERT CARD at check-in to the Emergency Department and triage nurse.  Should you have questions after your visit or need to cancel or reschedule your appointment, please contact [place] [facility]  Dept: [phone]  and follow the prompts.  Office hours are 8:00 a.m. to 4:30 p.m. Monday - Friday. Please note that voicemails left after 4:00 p.m. may not be returned until the following business day.  We are closed weekends and major holidays. You have access to a nurse at all times for urgent questions. Please call the main number to the clinic Dept: [phone] and follow the prompts.   For any non-urgent questions, you may also contact your provider using MyChart. We now offer e-Visits for anyone 55 and older to request care online for non-urgent symptoms. For details visit [email or web address].   Also download the MyChart app! Go to the app store, search "MyChart", open the app, select [facility], and log in with your MyChart username and password.  Oxaliplatin Injection What is this medication? OXALIPLATIN (ox AL i PLA tin) treats colorectal cancer. It works by slowing down the growth of cancer cells. This medicine may be used for other purposes; ask your health care provider or pharmacist if you have questions. COMMON BRAND NAME(S): Eloxatin What should I tell my care team before I take this medication? They need to know if you have any of these conditions: Heart disease History of irregular heartbeat or rhythm Liver disease Low blood cell levels (white cells, red cells, and platelets) Lung or breathing disease, such as asthma Take medications that treat or  prevent blood clots Tingling of the fingers, toes, or other nerve disorder An unusual or allergic reaction to oxaliplatin, other medications, foods, dyes, or preservatives If you or your partner are pregnant or trying to get pregnant Breast-feeding How should I use this  medication? This medication is injected into a vein. It is given by your care team in a hospital or clinic setting. Talk to your care team about the use of this medication in children. Special care may be needed. Overdosage: If you think you have taken too much of this medicine contact a poison control center or emergency room at once. NOTE: This medicine is only for you. Do not share this medicine with others. What if I miss a dose? Keep appointments for follow-up doses. It is important not to miss a dose. Call your care team if you are unable to keep an appointment. What may interact with this medication? Do not take this medication with any of the following: Cisapride Dronedarone Pimozide Thioridazine This medication may also interact with the following: Aspirin and aspirin-like medications Certain medications that treat or prevent blood clots, such as warfarin, apixaban, dabigatran, and rivaroxaban Cisplatin Cyclosporine Diuretics Medications for infection, such as acyclovir, adefovir, amphotericin B, bacitracin, cidofovir, foscarnet, ganciclovir, gentamicin, pentamidine, vancomycin NSAIDs, medications for pain and inflammation, such as ibuprofen or naproxen Other medications that cause heart rhythm changes Pamidronate Zoledronic acid This list may not describe all possible interactions. Give your health care provider a list of all the medicines, herbs, non-prescription drugs, or dietary supplements you use. Also tell them if you smoke, drink alcohol, or use illegal drugs. Some items may interact with your medicine. What should I watch for while using this medication? Your condition will be monitored carefully while you are receiving this medication. You may need blood work while taking this medication. This medication may make you feel generally unwell. This is not uncommon as chemotherapy can affect healthy cells as well as cancer cells. Report any side effects. Continue your course  of treatment even though you feel ill unless your care team tells you to stop. This medication may increase your risk of getting an infection. Call your care team for advice if you get a fever, chills, sore throat, or other symptoms of a cold or flu. Do not treat yourself. Try to avoid being around people who are sick. Avoid taking medications that contain aspirin, acetaminophen, ibuprofen, naproxen, or ketoprofen unless instructed by your care team. These medications may hide a fever. Be careful brushing or flossing your teeth or using a toothpick because you may get an infection or bleed more easily. If you have any dental work done, tell your dentist you are receiving this medication. This medication can make you more sensitive to cold. Do not drink cold drinks or use ice. Cover exposed skin before coming in contact with cold temperatures or cold objects. When out in cold weather wear warm clothing and cover your mouth and nose to warm the air that goes into your lungs. Tell your care team if you get sensitive to the cold. Talk to your care team if you or your partner are pregnant or think either of you might be pregnant. This medication can cause serious birth defects if taken during pregnancy and for 9 months after the last dose. A negative pregnancy test is required before starting this medication. A reliable form of contraception is recommended while taking this medication and for 9 months after the last dose. Talk to your care team  about effective forms of contraception. Do not father a child while taking this medication and for 6 months after the last dose. Use a condom while having sex during this time period. Do not breastfeed while taking this medication and for 3 months after the last dose. This medication may cause infertility. Talk to your care team if you are concerned about your fertility. What side effects may I notice from receiving this medication? Side effects that you should report to  your care team as soon as possible: Allergic reactions--skin rash, itching, hives, swelling of the face, lips, tongue, or throat Bleeding--bloody or black, tar-like stools, vomiting blood or brown material that looks like coffee grounds, red or dark brown urine, small red or purple spots on skin, unusual bruising or bleeding Dry cough, shortness of breath or trouble breathing Heart rhythm changes--fast or irregular heartbeat, dizziness, feeling faint or lightheaded, chest pain, trouble breathing Infection--fever, chills, cough, sore throat, wounds that don't heal, pain or trouble when passing urine, general feeling of discomfort or being unwell Liver injury--right upper belly pain, loss of appetite, nausea, light-colored stool, dark yellow or brown urine, yellowing skin or eyes, unusual weakness or fatigue Low red blood cell level--unusual weakness or fatigue, dizziness, headache, trouble breathing Muscle injury--unusual weakness or fatigue, muscle pain, dark yellow or brown urine, decrease in amount of urine Pain, tingling, or numbness in the hands or feet Sudden and severe headache, confusion, change in vision, seizures, which may be signs of posterior reversible encephalopathy syndrome (PRES) Unusual bruising or bleeding Side effects that usually do not require medical attention (report to your care team if they continue or are bothersome): Diarrhea Nausea Pain, redness, or swelling with sores inside the mouth or throat Unusual weakness or fatigue Vomiting This list may not describe all possible side effects. Call your doctor for medical advice about side effects. You may report side effects to FDA at 1-800-FDA-1088. Where should I keep my medication? This medication is given in a hospital or clinic. It will not be stored at home. NOTE: This sheet is a summary. It may not cover all possible information. If you have questions about this medicine, talk to your doctor, pharmacist, or health care  provider.  2024 Elsevier/Gold Standard (2022-06-26 00:00:00)  Irinotecan Injection What is this medication? IRINOTECAN (ir in oh TEE kan) treats some types of cancer. It works by slowing down the growth of cancer cells. This medicine may be used for other purposes; ask your health care provider or pharmacist if you have questions. COMMON BRAND NAME(S): Camptosar What should I tell my care team before I take this medication? They need to know if you have any of these conditions: Dehydration Diarrhea Infection, especially a viral infection, such as chickenpox, cold sores, herpes Liver disease Low blood cell levels (white cells, red cells, and platelets) Low levels of electrolytes, such as calcium, magnesium, or potassium in your blood Recent or ongoing radiation An unusual or allergic reaction to irinotecan, other medications, foods, dyes, or preservatives If you or your partner are pregnant or trying to get pregnant Breast-feeding How should I use this medication? This medication is injected into a vein. It is given by your care team in a hospital or clinic setting. Talk to your care team about the use of this medication in children. Special care may be needed. Overdosage: If you think you have taken too much of this medicine contact a poison control center or emergency room at once. NOTE: This medicine is only for  you. Do not share this medicine with others. What if I miss a dose? Keep appointments for follow-up doses. It is important not to miss your dose. Call your care team if you are unable to keep an appointment. What may interact with this medication? Do not take this medication with any of the following: Cobicistat Itraconazole This medication may also interact with the following: Certain antibiotics, such as clarithromycin, rifampin, rifabutin Certain antivirals for HIV or AIDS Certain medications for fungal infections, such as ketoconazole, posaconazole,  voriconazole Certain medications for seizures, such as carbamazepine, phenobarbital, phenytoin Gemfibrozil Nefazodone St. John's wort This list may not describe all possible interactions. Give your health care provider a list of all the medicines, herbs, non-prescription drugs, or dietary supplements you use. Also tell them if you smoke, drink alcohol, or use illegal drugs. Some items may interact with your medicine. What should I watch for while using this medication? Your condition will be monitored carefully while you are receiving this medication. You may need blood work while taking this medication. This medication may make you feel generally unwell. This is not uncommon as chemotherapy can affect healthy cells as well as cancer cells. Report any side effects. Continue your course of treatment even though you feel ill unless your care team tells you to stop. This medication can cause serious side effects. To reduce the risk, your care team may give you other medications to take before receiving this one. Be sure to follow the directions from your care team. This medication may affect your coordination, reaction time, or judgement. Do not drive or operate machinery until you know how this medication affects you. Sit up or stand slowly to reduce the risk of dizzy or fainting spells. Drinking alcohol with this medication can increase the risk of these side effects. This medication may increase your risk of getting an infection. Call your care team for advice if you get a fever, chills, sore throat, or other symptoms of a cold or flu. Do not treat yourself. Try to avoid being around people who are sick. Avoid taking medications that contain aspirin, acetaminophen, ibuprofen, naproxen, or ketoprofen unless instructed by your care team. These medications may hide a fever. This medication may increase your risk to bruise or bleed. Call your care team if you notice any unusual bleeding. Be careful  brushing or flossing your teeth or using a toothpick because you may get an infection or bleed more easily. If you have any dental work done, tell your dentist you are receiving this medication. Talk to your care team if you or your partner are pregnant or think either of you might be pregnant. This medication can cause serious birth defects if taken during pregnancy and for 6 months after the last dose. You will need a negative pregnancy test before starting this medication. Contraception is recommended while taking this medication and for 6 months after the last dose. Your care team can help you find the option that works for you. Do not father a child while taking this medication and for 3 months after the last dose. Use a condom for contraception during this time period. Do not breastfeed while taking this medication and for 7 days after the last dose. This medication may cause infertility. Talk to your care team if you are concerned about your fertility. What side effects may I notice from receiving this medication? Side effects that you should report to your care team as soon as possible: Allergic reactions--skin rash, itching, hives,  swelling of the face, lips, tongue, or throat Dry cough, shortness of breath or trouble breathing Increased saliva or tears, increased sweating, stomach cramping, diarrhea, small pupils, unusual weakness or fatigue, slow heartbeat Infection--fever, chills, cough, sore throat, wounds that don't heal, pain or trouble when passing urine, general feeling of discomfort or being unwell Kidney injury--decrease in the amount of urine, swelling of the ankles, hands, or feet Low red blood cell level--unusual weakness or fatigue, dizziness, headache, trouble breathing Severe or prolonged diarrhea Unusual bruising or bleeding Side effects that usually do not require medical attention (report to your care team if they continue or are bothersome): Constipation Diarrhea Hair  loss Loss of appetite Nausea Stomach pain This list may not describe all possible side effects. Call your doctor for medical advice about side effects. You may report side effects to FDA at 1-800-FDA-1088. Where should I keep my medication? This medication is given in a hospital or clinic. It will not be stored at home. NOTE: This sheet is a summary. It may not cover all possible information. If you have questions about this medicine, talk to your doctor, pharmacist, or health care provider.  2024 Elsevier/Gold Standard (2022-01-22 00:00:00)

## 2023-05-17 ENCOUNTER — Ambulatory Visit: Payer: Medicare Other | Admitting: Hematology

## 2023-05-17 ENCOUNTER — Other Ambulatory Visit: Payer: Medicare Other

## 2023-05-17 ENCOUNTER — Telehealth: Payer: Self-pay | Admitting: Hematology

## 2023-05-17 LAB — CANCER ANTIGEN 19-9: CA 19-9: 794 U/mL — ABNORMAL HIGH (ref 0–35)

## 2023-05-18 ENCOUNTER — Other Ambulatory Visit: Payer: Self-pay

## 2023-05-18 ENCOUNTER — Inpatient Hospital Stay: Payer: Medicare Other

## 2023-05-18 VITALS — BP 152/78 | HR 59 | Temp 97.2°F | Resp 16

## 2023-05-18 DIAGNOSIS — Z5111 Encounter for antineoplastic chemotherapy: Secondary | ICD-10-CM | POA: Diagnosis not present

## 2023-05-18 DIAGNOSIS — C252 Malignant neoplasm of tail of pancreas: Secondary | ICD-10-CM

## 2023-05-18 DIAGNOSIS — Z95828 Presence of other vascular implants and grafts: Secondary | ICD-10-CM

## 2023-05-18 MED ORDER — SODIUM CHLORIDE 0.9% FLUSH
10.0000 mL | Freq: Once | INTRAVENOUS | Status: AC
  Administered 2023-05-18: 10 mL

## 2023-05-18 MED ORDER — HEPARIN SOD (PORK) LOCK FLUSH 100 UNIT/ML IV SOLN
500.0000 [IU] | Freq: Once | INTRAVENOUS | Status: AC
  Administered 2023-05-18: 500 [IU]

## 2023-05-20 ENCOUNTER — Inpatient Hospital Stay: Payer: Medicare Other | Admitting: Licensed Clinical Social Worker

## 2023-05-20 ENCOUNTER — Encounter (HOSPITAL_COMMUNITY)
Admission: RE | Admit: 2023-05-20 | Discharge: 2023-05-20 | Disposition: A | Discharge status: Home / Self Care | Payer: Medicare Other | Source: Ambulatory Visit | Attending: Hematology | Admitting: Hematology

## 2023-05-20 DIAGNOSIS — C252 Malignant neoplasm of tail of pancreas: Secondary | ICD-10-CM

## 2023-05-20 LAB — GLUCOSE, CAPILLARY: Glucose-Capillary: 136 mg/dL — ABNORMAL HIGH (ref 70–99)

## 2023-05-20 MED ORDER — FLUDEOXYGLUCOSE F - 18 (FDG) INJECTION
7.5100 | Freq: Once | INTRAVENOUS | Status: AC
  Administered 2023-05-20: 7.51 via INTRAVENOUS

## 2023-05-20 NOTE — Progress Notes (Signed)
[facility] Clinical Social Work  Initial Assessment   Scott [name] is a 72 y.o. year old male contacted by phone. Clinical Social Work was referred by medical provider for assessment of psychosocial needs.   SDOH (Social Determinants of Health) assessments performed: Yes   SDOH Screenings   Tobacco Use: High Risk (05/16/2023)     Distress Screen completed: No     No data to display            Family/Social Information:  Housing Arrangement: patient lives with his wife.  Family members/support persons in your life? The couple has 1 daughter in [place] and 1 in [place].  Pt reports having support from friends as well should needs arise. Transportation concerns: no  Employment: Retired from [employer].  Income source: Actor concerns: No Type of concern: None Food access concerns: no Religious or spiritual practice: Yes-[employer] [facility] Currently in place:  none  Coping/ Adjustment to diagnosis: Patient understands treatment plan and what happens next? yes Concerns about diagnosis and/or treatment: Overwhelmed by information, How will I care for myself, and Quality of life Patient reported stressors: Adjusting to my illness and Physical issues Hopes and/or priorities: Pt's priority is to continue treatment w/ the hope of positive results Patient enjoys time with family/ friends Current coping skills/ strengths: Ability for insight , Capable of independent living , Motivation for treatment/growth , Physical Health , and Supportive family/friends     SUMMARY: Current SDOH Barriers:  No barriers identified at this time.  Clinical Social Work Clinical Goal(s):  No clinical social work goals at this time  Interventions: Discussed common feeling and emotions when being diagnosed with cancer, and the importance of support during treatment Informed patient of the support team roles and support services at [facility] Provided CSW contact information and  encouraged patient to call with any questions or concerns    Follow Up Plan: Patient will contact CSW with any support or resource needs Patient verbalizes understanding of plan: Yes    Rachel Moulds, LCSW Clinical Social Worker [facility]

## 2023-05-21 ENCOUNTER — Other Ambulatory Visit: Payer: Self-pay | Admitting: Cardiovascular Disease

## 2023-05-21 ENCOUNTER — Telehealth: Payer: Self-pay | Admitting: *Deleted

## 2023-05-21 DIAGNOSIS — I4891 Unspecified atrial fibrillation: Secondary | ICD-10-CM

## 2023-05-21 DIAGNOSIS — C252 Malignant neoplasm of tail of pancreas: Secondary | ICD-10-CM

## 2023-05-21 NOTE — Telephone Encounter (Signed)
Patient called after hours line over the weekend with concerns of a feeling that his throat was closing without difficulty breathing. At the time of this return call, symptoms have improved.  Discussed with patient the side effects of Oxaliplatin again, patient notes the [facility] in his home is set very low. The air is cold. He will keep this in mind. We discussed using a mask to breath into when he feels this again to see if breathing the humidified air helps.

## 2023-05-21 NOTE — Telephone Encounter (Signed)
Prescription refill request for Eliquis received. Indication: AF Last office visit: 03/12/23  P [name] MD Scr: 0.79 on 05/16/23 Age: 72 Weight: 71.1kg  Based on above findings Eliquis 5mg  twice daily is the appropriate dose.  Refill approved.

## 2023-05-22 ENCOUNTER — Inpatient Hospital Stay (HOSPITAL_BASED_OUTPATIENT_CLINIC_OR_DEPARTMENT_OTHER): Payer: Medicare Other | Admitting: Hematology

## 2023-05-22 DIAGNOSIS — C252 Malignant neoplasm of tail of pancreas: Secondary | ICD-10-CM

## 2023-05-22 DIAGNOSIS — G893 Neoplasm related pain (acute) (chronic): Secondary | ICD-10-CM

## 2023-05-22 NOTE — Assessment & Plan Note (Addendum)
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  -baseline CA19.9 872 -EUS pancreatic mass biopsy confirmed adenocarcinoma, I reviewed with pt -He has been referred to [facility], appointment pending, I will check  -This is likely resectable disease.  I discussed option of upfront surgery and adjuvant chemotherapy, versus neoadjuvant chemotherapy followed by surgery.  We reviewed his case in tumor board, and recommended neoadjuvant chemotherapy.  Will repeat CT with pancreatic cancer protocol after neoadjuvant chemo. -Staging PET scan from 8/26 reviewed, no metastatic disease, there is some mild uptake in the periaortic lymph node, will monitor in the future scan.  I also discussed incidental finding of thyroid nodule, will order thyroid ultrasound for evaluation. -He started first cycle of chemotherapy FOLFIRINOX on May 16, 2023. He tolerated first cycle moderately well with fatigue, cold sensitivity and abdominal pain etc, discussed management of side effect. -Patient and his wife also had multiple questions about his overall treatment plan especially the timing of surgery, I answered all to their satisfaction.

## 2023-05-22 NOTE — Progress Notes (Addendum)
[facility]   Telephone:([phone] Fax:(336) [phone]   Clinic Follow up Note   Patient Care Team: [name], MD as PCP - General [name], [name], MD as PCP - Cardiology (Cardiology) [name], MD as Consulting Physician (Hematology) 05/22/2023  I connected with Scott [name] on 05/22/23 at  8:20 AM EDT by telephone and verified that I am speaking with the correct person using two identifiers.   I discussed the limitations, risks, security and privacy concerns of performing an evaluation and management service by telephone and the availability of in person appointments. I also discussed with the patient that there may be a patient responsible charge related to this service. The patient expressed understanding and agreed to proceed.   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  -baseline CA19.9 872 -EUS pancreatic mass biopsy confirmed adenocarcinoma, I reviewed with pt -He has been referred to [facility], appointment pending, I will check  -This is likely resectable disease.  I discussed option of upfront surgery and adjuvant chemotherapy, versus neoadjuvant chemotherapy followed by surgery.  We reviewed his case in tumor board, and recommended neoadjuvant chemotherapy.  Will repeat CT with pancreatic cancer protocol after neoadjuvant chemo. -Staging PET scan from 8/26 reviewed, no metastatic disease, there is some mild uptake in the periaortic lymph node, will monitor in the future scan.  I also discussed incidental finding of thyroid nodule, will order thyroid  ultrasound for evaluation. -He started first cycle of chemotherapy FOLFIRINOX on May 16, 2023. He tolerated first cycle moderately well with fatigue, cold sensitivity and abdominal pain etc, discussed management of side effect. -Patient and his wife also had multiple questions about his overall treatment plan especially the timing of surgery, I answered all to their satisfaction.  Cancer related pain -Patient has chronic back pain, on hydrocodone -His abdominal pain is related to his pancreatic cancer -He will continue follow-up with his PCP for his pain management  Plan -We discussed the side effect management from chemotherapy -He will return next week for cycle 2 chemotherapy -Reviewed the PET scan, will order thyroid ultrasound for further evaluation  SUMMARY OF ONCOLOGIC HISTORY: Oncology History  Pancreatic cancer (HCC)  05/01/2023 Cancer Staging   Staging form: Exocrine Pancreas, AJCC 8th Edition - Clinical stage from 05/01/2023: Stage III (cT3, cN2, cM0) - Signed by [name], MD on 05/03/2023   05/02/2023 Initial Diagnosis   Pancreatic cancer (HCC)   05/16/2023 -  Chemotherapy   Patient is on Treatment Plan : PANCREAS Modified FOLFIRINOX q14d x 4 cycles       INTERVAL HISTORY: Pt is scheduled for a phone visit after his first cycle chemotherapy.  He overall tolerated monitor well, with moderate fatigue, cold sensitivity, and had few episodes of abdominal pain.  he is eating and drinking adequately, mild constipation, no diarrhea.  He is able to function at home.   REVIEW OF SYSTEMS:   Constitutional: Denies fevers, chills or abnormal weight loss, (+) fatigue  Eyes: Denies blurriness of vision Ears, nose, mouth, throat, and face: Denies mucositis or sore throat Respiratory: Denies cough, dyspnea or  wheezes Cardiovascular: Denies palpitation, chest discomfort or lower extremity swelling Gastrointestinal:  Denies nausea, heartburn or change in bowel habits Skin: Denies  abnormal skin rashes Lymphatics: Denies new lymphadenopathy or easy bruising Neurological:Denies numbness, tingling or new weaknesses Behavioral/Psych: Mood is stable, no new changes  All other systems were reviewed with the patient and are negative.  MEDICAL HISTORY:  Past Medical History:  Diagnosis Date   CAD (coronary artery disease)    Cerebrovascular disease    Diabetes mellitus without complication (HCC)    HLD (hyperlipidemia)    HTN (hypertension)    IBS (irritable bowel syndrome)    Patient states did not have   Lumbar stenosis    MI, old    Persistent atrial fibrillation (HCC)    PVD (peripheral vascular disease) (HCC)    Stroke (cerebrum) (HCC)    Tobacco abuse     SURGICAL HISTORY: Past Surgical History:  Procedure Laterality Date   ABDOMINAL AORTOGRAM W/LOWER EXTREMITY N/A 01/25/2017   Procedure: Abdominal Aortogram w/Lower Extremity;  Surgeon: Sherren Kerns, MD;  Location: [facility] INVASIVE CV LAB;  Service: Cardiovascular;  Laterality: N/A;   ANTERIOR CRUCIATE LIGAMENT REPAIR     CARDIAC CATHETERIZATION     CONE   CARDIAC CATHETERIZATION N/A 07/27/2016   Procedure: Left Heart Cath and Coronary Angiography;  Surgeon: Tonny Bollman, MD;  Location: [facility] INVASIVE CV LAB;  Service: Cardiovascular;  Laterality: N/A;   CARDIOVERSION N/A 06/29/2016   Procedure: CARDIOVERSION;  Surgeon: Pricilla Riffle, MD;  Location: [facility] ENDOSCOPY;  Service: Cardiovascular;  Laterality: N/A;   CARDIOVERSION N/A 03/05/2018   Procedure: CARDIOVERSION;  Surgeon: Laurey Morale, MD;  Location: [facility] ENDOSCOPY;  Service: Cardiovascular;  Laterality: N/A;   CHOLECYSTECTOMY     ENDARTERECTOMY Right 12/06/2017   Procedure: ENDARTERECTOMY CAROTID RIGHT;  Surgeon: Nada Libman, MD;  Location: [facility] OR;  Service: Vascular;  Laterality: Right;   ESOPHAGOGASTRODUODENOSCOPY (EGD) WITH PROPOFOL N/A 05/01/2023   Procedure: ESOPHAGOGASTRODUODENOSCOPY (EGD) WITH PROPOFOL;  Surgeon: Willis Modena, MD;   Location: [facility] ENDOSCOPY;  Service: Gastroenterology;  Laterality: N/A;   FINE NEEDLE ASPIRATION N/A 05/01/2023   Procedure: FINE NEEDLE ASPIRATION (FNA) LINEAR;  Surgeon: Willis Modena, MD;  Location: [facility] ENDOSCOPY;  Service: Gastroenterology;  Laterality: N/A;   KNEE SURGERY Bilateral 1982, 1988, 1997, 2000   x 4 times.   LOWER EXTREMITY ANGIOGRAPHY N/A 07/03/2019   Procedure: LOWER EXTREMITY ANGIOGRAPHY;  Surgeon: Sherren Kerns, MD;  Location: [facility] INVASIVE CV LAB;  Service: Cardiovascular;  Laterality: N/A;   LUMBAR LAMINECTOMY/DECOMPRESSION MICRODISCECTOMY Bilateral 09/09/2018   Procedure: Laminectomy and Foraminotomy bilateral Lumbar three-Lumbar four - Lumbar four-Lumbar five;  Surgeon: Julio Sicks, MD;  Location: [facility] OR;  Service: Neurosurgery;  Laterality: Bilateral;   PATCH ANGIOPLASTY Right 12/06/2017   Procedure: PATCH ANGIOPLASTY RIGHT CAROTID ARTERY;  Surgeon: Nada Libman, MD;  Location: [facility] OR;  Service: Vascular;  Laterality: Right;   PERIPHERAL VASCULAR INTERVENTION Right 01/25/2017   Procedure: Peripheral Vascular Intervention;  Surgeon: Sherren Kerns, MD;  Location[facility] INVASIVE CV LAB;  Service: Cardiovascular;  Laterality: Right;   PERIPHERAL VASCULAR INTERVENTION Right 07/03/2019   Procedure: PERIPHERAL VASCULAR INTERVENTION;  Surgeon: Sherren Kerns, MD;  Location: [facility] INVASIVE CV LAB;  Service: Cardiovascular;  Laterality: Right;   PORTACATH PLACEMENT Left 05/14/2023   Procedure: INSERTION PORT-A-CATH;  Surgeon: Almond Lint, MD;  Location: [facility] OR;  Service: General;  Laterality: Left;   TONSILLECTOMY     UPPER ESOPHAGEAL ENDOSCOPIC ULTRASOUND (EUS) Bilateral 05/01/2023  Procedure: UPPER ESOPHAGEAL ENDOSCOPIC ULTRASOUND (EUS);  Surgeon: Willis Modena, MD;  Location: [facility] ENDOSCOPY;  Service: Gastroenterology;  Laterality: Bilateral;    I have reviewed the social history and family history with the patient and they are unchanged from previous note.  ALLERGIES:   is allergic to augmentin [amoxicillin-pot clavulanate].  MEDICATIONS:  Current Outpatient Medications  Medication Sig Dispense Refill   amLODipine (NORVASC) 5 MG tablet Take 5 mg by mouth at bedtime.     aspirin EC 81 MG tablet Take 81 mg by mouth daily.     diazepam (VALIUM) 2 MG tablet Take 2 mg by mouth 2 (two) times daily as needed for anxiety.     docusate sodium (COLACE) 100 MG capsule Take 200 mg by mouth daily.     ELIQUIS 5 MG TABS tablet TAKE 1 TABLET BY MOUTH TWICE DAILY. PLEASE KEEP UPCOMING APPOINTMENT 60 tablet 1   esomeprazole (NEXIUM) 40 MG capsule Take 40 mg by mouth daily as needed (heartburn/hiccups).     fenofibrate (TRICOR) 48 MG tablet Take 48 mg by mouth daily.     gabapentin (NEURONTIN) 300 MG capsule Take 900 mg by mouth 2 (two) times daily.     glipiZIDE (GLUCOTROL XL) 5 MG 24 hr tablet Take 5 mg by mouth daily after supper.     hyoscyamine (LEVSIN SL) 0.125 MG SL tablet Place 0.125 mg under the tongue every 4 (four) hours as needed for cramping.     Insulin Glargine (BASAGLAR KWIKPEN) 100 UNIT/ML Inject 20 Units into the skin daily.     JARDIANCE 25 MG TABS tablet Take 25 mg by mouth daily.     lidocaine-prilocaine (EMLA) cream Apply to affected area once 30 g 3   metFORMIN (GLUCOPHAGE-XR) 500 MG 24 hr tablet Take 1,500 mg by mouth daily after supper.     ondansetron (ZOFRAN) 4 MG tablet Take 4 mg by mouth every 8 (eight) hours as needed for nausea or vomiting.     ondansetron (ZOFRAN) 8 MG tablet Take 1 tablet (8 mg total) by mouth every 8 (eight) hours as needed for nausea or vomiting. Start on the third day after cisplatin 30 tablet 1   polyethylene glycol (MIRALAX / GLYCOLAX) 17 g packet Take 17 g by mouth daily as needed for moderate constipation.     prochlorperazine (COMPAZINE) 10 MG tablet Take 1 tablet (10 mg total) by mouth every 6 (six) hours as needed for nausea or vomiting (Nausea or vomiting). 30 tablet 1   rosuvastatin (CRESTOR) 40 MG tablet Take 40 mg  by mouth daily.     sildenafil (VIAGRA) 100 MG tablet Take 100 mg by mouth as needed for erectile dysfunction.     spironolactone (ALDACTONE) 25 MG tablet Take 25 mg by mouth daily.     valsartan (DIOVAN) 320 MG tablet Take 320 mg by mouth daily.     No current facility-administered medications for this visit.    PHYSICAL EXAMINATION: Not performed   LABORATORY DATA:  I have reviewed the data as listed    Latest Ref Rng & Units 05/16/2023    9:13 AM 05/01/2023   11:26 AM 07/03/2019    6:46 AM  CBC  WBC 4.0 - 10.5 K/uL 10.3  8.5    Hemoglobin 13.0 - 17.0 g/dL 60.4  54.0  98.1   Hematocrit 39.0 - 52.0 % 44.4  43.5  42.0   Platelets 150 - 400 K/uL 239  207  Latest Ref Rng & Units 05/16/2023    9:13 AM 05/01/2023   11:26 AM 02/05/2022   11:30 AM  CMP  Glucose 70 - 99 mg/dL 409  94    BUN 8 - 23 mg/dL 22  18    Creatinine 8.11 - 1.24 mg/dL 9.14  7.82    Sodium 956 - 145 mmol/L 138  137    Potassium 3.5 - 5.1 mmol/L 4.0  3.8    Chloride 98 - 111 mmol/L 105  107    CO2 22 - 32 mmol/L 24  23    Calcium 8.9 - 10.3 mg/dL 9.0  8.6    Total Protein 6.5 - 8.1 g/dL 6.8  6.2    Total Bilirubin 0.3 - 1.2 mg/dL 0.7  0.5    Alkaline Phos 38 - 126 U/L 103  95    AST 15 - 41 U/L 68  144    ALT 0 - 44 U/L 107  156  15       RADIOGRAPHIC STUDIES: I have personally reviewed the radiological images as listed and agreed with the findings in the report. No results found.     I discussed the assessment and treatment plan with the patient. The patient was provided an opportunity to ask questions and all were answered. The patient agreed with the plan and demonstrated an understanding of the instructions.   The patient was advised to call back or seek an in-person evaluation if the symptoms worsen or if the condition fails to improve as anticipated.  I provided 25 minutes of non face-to-face telephone visit time during this encounter, and > 50% was spent counseling as documented under  my assessment & plan.     [name], MD 05/22/23

## 2023-05-22 NOTE — Assessment & Plan Note (Signed)
-  Patient has chronic back pain, on hydrocodone -His abdominal pain is related to his pancreatic cancer -He will continue follow-up with his PCP for his pain management

## 2023-05-24 ENCOUNTER — Other Ambulatory Visit: Payer: Self-pay

## 2023-05-24 ENCOUNTER — Other Ambulatory Visit: Payer: Self-pay | Admitting: Hematology

## 2023-05-24 ENCOUNTER — Telehealth: Payer: Self-pay

## 2023-05-24 NOTE — Telephone Encounter (Signed)
Pt called stating he's having abdominal pain and he contacted Dr. Arita Miss office for a refill on his pain medication but was told to contact Dr. Latanya Maudlin office.  Pt stated the prescription was for Oxycodone but he ran out.  Pt requesting in Dr. Mosetta Putt could refill the Oxycodone.  Informed pt Dr. Mosetta Putt is out of the office today but this nurse will make Dr. Mosetta Putt aware of the pt's request.  Pt stated he will also contact Dr. Arita Miss office again to see if he could get a refill.

## 2023-05-28 ENCOUNTER — Ambulatory Visit (HOSPITAL_COMMUNITY)
Admission: RE | Admit: 2023-05-28 | Discharge: 2023-05-28 | Disposition: A | Discharge status: Home / Self Care | Payer: Medicare Other | Source: Ambulatory Visit | Attending: Hematology | Admitting: Hematology

## 2023-05-28 DIAGNOSIS — G893 Neoplasm related pain (acute) (chronic): Secondary | ICD-10-CM | POA: Diagnosis present

## 2023-05-28 DIAGNOSIS — C252 Malignant neoplasm of tail of pancreas: Secondary | ICD-10-CM | POA: Diagnosis present

## 2023-05-29 ENCOUNTER — Encounter: Payer: Self-pay | Admitting: Hematology

## 2023-05-29 ENCOUNTER — Inpatient Hospital Stay: Payer: Medicare Other | Admitting: Hematology

## 2023-05-29 ENCOUNTER — Inpatient Hospital Stay: Payer: Medicare Other

## 2023-05-29 ENCOUNTER — Inpatient Hospital Stay: Payer: Medicare Other | Attending: Hematology

## 2023-05-29 VITALS — BP 133/68 | HR 70 | Temp 98.0°F | Resp 16 | Ht 70.0 in | Wt 145.0 lb

## 2023-05-29 DIAGNOSIS — Z5111 Encounter for antineoplastic chemotherapy: Secondary | ICD-10-CM | POA: Insufficient documentation

## 2023-05-29 DIAGNOSIS — C252 Malignant neoplasm of tail of pancreas: Secondary | ICD-10-CM

## 2023-05-29 DIAGNOSIS — Z79899 Other long term (current) drug therapy: Secondary | ICD-10-CM | POA: Insufficient documentation

## 2023-05-29 DIAGNOSIS — G893 Neoplasm related pain (acute) (chronic): Secondary | ICD-10-CM

## 2023-05-29 DIAGNOSIS — Z95828 Presence of other vascular implants and grafts: Secondary | ICD-10-CM

## 2023-05-29 LAB — CBC WITH DIFFERENTIAL (CANCER CENTER ONLY)
Abs Immature Granulocytes: 0.01 10*3/uL (ref 0.00–0.07)
Basophils Absolute: 0 10*3/uL (ref 0.0–0.1)
Basophils Relative: 0 %
Eosinophils Absolute: 0.3 10*3/uL (ref 0.0–0.5)
Eosinophils Relative: 4 %
HCT: 42.6 % (ref 39.0–52.0)
Hemoglobin: 14.8 g/dL (ref 13.0–17.0)
Immature Granulocytes: 0 %
Lymphocytes Relative: 26 %
Lymphs Abs: 1.8 10*3/uL (ref 0.7–4.0)
MCH: 29.6 pg (ref 26.0–34.0)
MCHC: 34.7 g/dL (ref 30.0–36.0)
MCV: 85.2 fL (ref 80.0–100.0)
Monocytes Absolute: 0.7 10*3/uL (ref 0.1–1.0)
Monocytes Relative: 10 %
Neutro Abs: 4.3 10*3/uL (ref 1.7–7.7)
Neutrophils Relative %: 60 %
Platelet Count: 259 10*3/uL (ref 150–400)
RBC: 5 MIL/uL (ref 4.22–5.81)
RDW: 13.9 % (ref 11.5–15.5)
WBC Count: 7.2 10*3/uL (ref 4.0–10.5)
nRBC: 0 % (ref 0.0–0.2)

## 2023-05-29 LAB — CMP (CANCER CENTER ONLY)
ALT: 85 U/L — ABNORMAL HIGH (ref 0–44)
AST: 37 U/L (ref 15–41)
Albumin: 3.9 g/dL (ref 3.5–5.0)
Alkaline Phosphatase: 92 U/L (ref 38–126)
Anion gap: 8 (ref 5–15)
BUN: 18 mg/dL (ref 8–23)
CO2: 26 mmol/L (ref 22–32)
Calcium: 9 mg/dL (ref 8.9–10.3)
Chloride: 105 mmol/L (ref 98–111)
Creatinine: 0.84 mg/dL (ref 0.61–1.24)
GFR, Estimated: >60 mL/min (ref >60)
Glucose, Bld: 129 mg/dL — ABNORMAL HIGH (ref 70–99)
Potassium: 3.6 mmol/L (ref 3.5–5.1)
Sodium: 139 mmol/L (ref 135–145)
Total Bilirubin: 0.4 mg/dL (ref 0.3–1.2)
Total Protein: 6.5 g/dL (ref 6.5–8.1)

## 2023-05-29 MED ORDER — SODIUM CHLORIDE 0.9% FLUSH
10.0000 mL | INTRAVENOUS | Status: DC | PRN
  Administered 2023-05-29: 10 mL

## 2023-05-29 MED ORDER — SODIUM CHLORIDE 0.9 % IV SOLN
400.0000 mg/m2 | Freq: Once | INTRAVENOUS | Status: AC
  Administered 2023-05-29: 744 mg via INTRAVENOUS
  Filled 2023-05-29: qty 17.5

## 2023-05-29 MED ORDER — PALONOSETRON HCL INJECTION 0.25 MG/5ML
0.2500 mg | Freq: Once | INTRAVENOUS | Status: AC
  Administered 2023-05-29: 0.25 mg via INTRAVENOUS
  Filled 2023-05-29: qty 5

## 2023-05-29 MED ORDER — ATROPINE SULFATE 1 MG/ML IV SOLN
0.5000 mg | Freq: Once | INTRAVENOUS | Status: DC | PRN
  Filled 2023-05-29: qty 1

## 2023-05-29 MED ORDER — SODIUM CHLORIDE 0.9 % IV SOLN
150.0000 mg | Freq: Once | INTRAVENOUS | Status: AC
  Administered 2023-05-29: 150 mg via INTRAVENOUS
  Filled 2023-05-29: qty 150

## 2023-05-29 MED ORDER — SODIUM CHLORIDE 0.9 % IV SOLN
2400.0000 mg/m2 | INTRAVENOUS | Status: DC
  Administered 2023-05-29: 4450 mg via INTRAVENOUS
  Filled 2023-05-29: qty 89

## 2023-05-29 MED ORDER — MORPHINE SULFATE ER 15 MG PO TBCR: 15.0000 mg | EXTENDED_RELEASE_TABLET | Freq: Two times a day (BID) | ORAL | 0 refills | Status: DC

## 2023-05-29 MED ORDER — SODIUM CHLORIDE 0.9 % IV SOLN
10.0000 mg | Freq: Once | INTRAVENOUS | Status: AC
  Administered 2023-05-29: 10 mg via INTRAVENOUS
  Filled 2023-05-29: qty 10

## 2023-05-29 MED ORDER — SODIUM CHLORIDE 0.9 % IV SOLN: Freq: Once | INTRAVENOUS | Status: AC

## 2023-05-29 MED ORDER — SODIUM CHLORIDE 0.9% FLUSH
10.0000 mL | Freq: Once | INTRAVENOUS | Status: AC
  Administered 2023-05-29: 10 mL

## 2023-05-29 MED ORDER — OXALIPLATIN CHEMO INJECTION 100 MG/20ML
70.0000 mg/m2 | Freq: Once | INTRAVENOUS | Status: AC
  Administered 2023-05-29: 130 mg via INTRAVENOUS
  Filled 2023-05-29: qty 26

## 2023-05-29 MED ORDER — DEXTROSE 5 % IV SOLN: Freq: Once | INTRAVENOUS | Status: AC

## 2023-05-29 MED ORDER — SODIUM CHLORIDE 0.9 % IV SOLN
120.0000 mg/m2 | Freq: Once | INTRAVENOUS | Status: AC
  Administered 2023-05-29: 220 mg via INTRAVENOUS
  Filled 2023-05-29: qty 11

## 2023-05-29 NOTE — Assessment & Plan Note (Addendum)
-  Patient has chronic back pain, on hydrocodone, managed by his PCP -His abdominal pain is related to his pancreatic cancer -Due to uncontrolled pain, which has significantly impacted his sleep, I recommend him to try MS Contin 15 mg twice daily, benefit and potential side effect, especially constipation and drowsiness were discussed with him in detail. -I will provide his pain management.  I we will inform his PCP.

## 2023-05-29 NOTE — Patient Instructions (Addendum)
[place] [facility]  Discharge Instructions: Thank you for choosing [facility] to provide your oncology and hematology care.   If you have a lab appointment with the Cancer Center, please go directly to the Cancer Center and check in at the registration area.   Wear comfortable clothing and clothing appropriate for easy access to any Portacath or PICC line.   We strive to give you quality time with your provider. You may need to reschedule your appointment if you arrive late (15 or more minutes).  Arriving late affects you and other patients whose appointments are after yours.  Also, if you miss three or more appointments without notifying the office, you may be dismissed from the clinic at the provider's discretion.      For prescription refill requests, have your pharmacy contact our office and allow 72 hours for refills to be completed.    Today you received the following chemotherapy and/or immunotherapy agents Oxaliplatin / Leucovorin / Irinotecan / Fluorouracil      To help prevent nausea and vomiting after your treatment, we encourage you to take your nausea medication as directed.  BELOW ARE SYMPTOMS THAT SHOULD BE REPORTED IMMEDIATELY: *FEVER GREATER THAN 100.4 F (38 C) OR HIGHER *CHILLS OR SWEATING *NAUSEA AND VOMITING THAT IS NOT CONTROLLED WITH YOUR NAUSEA MEDICATION *UNUSUAL SHORTNESS OF BREATH *UNUSUAL BRUISING OR BLEEDING *URINARY PROBLEMS (pain or burning when urinating, or frequent urination) *BOWEL PROBLEMS (unusual diarrhea, constipation, pain near the anus) TENDERNESS IN MOUTH AND THROAT WITH OR WITHOUT PRESENCE OF ULCERS (sore throat, sores in mouth, or a toothache) UNUSUAL RASH, SWELLING OR PAIN  UNUSUAL VAGINAL DISCHARGE OR ITCHING   Items with * indicate a potential emergency and should be followed up as soon as possible or go to the Emergency Department if any problems should occur.  Please show the CHEMOTHERAPY ALERT  CARD or IMMUNOTHERAPY ALERT CARD at check-in to the Emergency Department and triage nurse.  Should you have questions after your visit or need to cancel or reschedule your appointment, please contact [place] [facility]  Dept: [phone]  and follow the prompts.  Office hours are 8:00 a.m. to 4:30 p.m. Monday - Friday. Please note that voicemails left after 4:00 p.m. may not be returned until the following business day.  We are closed weekends and major holidays. You have access to a nurse at all times for urgent questions. Please call the main number to the clinic Dept: [phone] and follow the prompts.   For any non-urgent questions, you may also contact your provider using MyChart. We now offer e-Visits for anyone 62 and older to request care online for non-urgent symptoms. For details visit [email or web address].   Also download the MyChart app! Go to the app store, search "MyChart", open the app, select [place], and log in with your MyChart username and password.  The chemotherapy medication bag should finish at 46 hours, 96 hours, or 7 days. For example, if your pump is scheduled for 46 hours and it was put on at 4:00 p.m., it should finish at 2:00 p.m. the day it is scheduled to come off regardless of your appointment time.     Estimated time to finish at approximately 1:30 PM on 05/31/2023.   If the display on your pump reads "Low Volume" and it is beeping, take the batteries out of the pump and come to the cancer center for it to be taken off.  If the pump alarms go off prior to the pump reading "Low Volume" then call [phone] and someone can assist you.  If the plunger comes out and the chemotherapy medication is leaking out, please use your home chemo spill kit to clean up the spill. Do NOT use paper towels or other household products.  If you have problems or questions regarding your pump, please call either [phone] (24 hours a day)  or the cancer center Monday-Friday 8:00 a.m.- 4:30 p.m. at the clinic number and we will assist you. If you are unable to get assistance, then go to the nearest Emergency Department and ask the staff to contact the IV team for assistance.

## 2023-05-29 NOTE — Progress Notes (Signed)
[facility]   [phone] Fax:([phone]   Clinic Follow up Note   Patient Care Team: [name], MD as PCP - General [name], [name], MD as PCP - Cardiology (Cardiology) [name], MD as Consulting Physician (Hematology)  Date of Service:  05/29/2023  CHIEF COMPLAINT: f/u of Pancreatic Cancer  CURRENT THERAPY:  FOLFIRINOX   ASSESSMENT:  Scott [name] is a 72 y.o. male with   Pancreatic cancer (HCC) -cT3N0M0, stage IIA --Patient presented with abdominal pain and weight loss, CT scan revealed a 4.4 x 3.5 cm ill-defined mass in the pancreatic tail, which 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  -baseline CA19.9 872 -EUS pancreatic mass biopsy confirmed adenocarcinoma, I reviewed with pt -He has been referred to [facility], appointment pending, I will check  -This is likely resectable disease.  I discussed option of upfront surgery and adjuvant chemotherapy, versus neoadjuvant chemotherapy followed by surgery.  We reviewed his case in tumor board, and recommended neoadjuvant chemotherapy.  Will repeat CT with pancreatic cancer protocol after neoadjuvant chemo. -Staging PET scan from 8/26 reviewed, no metastatic disease, there is some mild uptake in the periaortic lymph node, will monitor in the future scan.  I also discussed incidental finding of thyroid nodule, will order thyroid ultrasound for evaluation. -He started first cycle of chemotherapy FOLFIRINOX on May 16, 2023. He tolerated first cycle moderately well with fatigue, cold sensitivity and abdominal pain etc, discussed management of side effect. -Due to significant weight loss and fatigue after first cycle chemo, I reduced his oxaliplatin and irinotecan dose from cycle 2. -plan to repeat CT scan after C5 or C6 chemo   Cancer related pain -Patient has chronic back pain, on hydrocodone, managed by his PCP -His abdominal pain is  related to his pancreatic cancer -Due to uncontrolled pain, which has significantly impacted his sleep, I recommend him to try MS Contin 15 mg twice daily, benefit and potential side effect, especially constipation and drowsiness were discussed with him in detail. -I will provide his pain management.  I we will inform his PCP.     PLAN: -lab reviewed w/ pt -CMP -pending -I prescribe MS Contin for pain -Continue Monitoring tumor marker -proceed with Folfirinox -Oxaliplatin /Irinotecan at reduce dose due to significant weight loss and fatigue  -lab/flush and treatment 9/17 -He was see our dietitian today   SUMMARY OF ONCOLOGIC HISTORY: Oncology History Overview Note   Cancer Staging  Pancreatic cancer [facility]) Staging form: Exocrine Pancreas, AJCC 8th Edition - Clinical stage from 05/01/2023: Stage III (cT3, cN2, cM0) - Signed by [name], MD on 05/03/2023     Pancreatic cancer (HCC)  05/01/2023 Cancer Staging   Staging form: Exocrine Pancreas, AJCC 8th Edition - Clinical stage from 05/01/2023: Stage III (cT3, cN2, cM0) - Signed by [name], MD on 05/03/2023   05/02/2023 Initial Diagnosis   Pancreatic cancer (HCC)   05/16/2023 -  Chemotherapy   Patient is on Treatment Plan : PANCREAS Modified FOLFIRINOX q14d x 4 cycles        INTERVAL HISTORY:  Scott [name] is here for a follow up of  Pancreatic cancer. He was last seen by me on 05/22/2023 He presents to the clinic accompanied by wife. Pt state that he has not be able to sleep due to pain. He reports that last night was the first night he was able to sleep. He is having pain in the abdominal area.  Pt state that he has taking Oxycodone and hydrocodone for pain relief.Pt state that he has been taking up to 6-9 between the two pain pills.     All other systems were reviewed with the patient and are negative.  MEDICAL HISTORY:  Past Medical History:  Diagnosis Date   CAD (coronary artery disease)    Cerebrovascular disease     Diabetes mellitus without complication (HCC)    HLD (hyperlipidemia)    HTN (hypertension)    IBS (irritable bowel syndrome)    Patient states did not have   Lumbar stenosis    MI, old    Persistent atrial fibrillation (HCC)    PVD (peripheral vascular disease) (HCC)    Stroke (cerebrum) (HCC)    Tobacco abuse     SURGICAL HISTORY: Past Surgical History:  Procedure Laterality Date   ABDOMINAL AORTOGRAM W/LOWER EXTREMITY N/A 01/25/2017   Procedure: Abdominal Aortogram w/Lower Extremity;  Surgeon: Sherren Kerns, MD;  Location: [facility] INVASIVE CV LAB;  Service: Cardiovascular;  Laterality: N/A;   ANTERIOR CRUCIATE LIGAMENT REPAIR     CARDIAC CATHETERIZATION     CONE   CARDIAC CATHETERIZATION N/A 07/27/2016   Procedure: Left Heart Cath and Coronary Angiography;  Surgeon: Tonny Bollman, MD;  Location[facility] INVASIVE CV LAB;  Service: Cardiovascular;  Laterality: N/A;   CARDIOVERSION N/A 06/29/2016   Procedure: CARDIOVERSION;  Surgeon: Pricilla Riffle, MD;  Location: [facility] ENDOSCOPY;  Service: Cardiovascular;  Laterality: N/A;   CARDIOVERSION N/A 03/05/2018   Procedure: CARDIOVERSION;  Surgeon: Laurey Morale, MD;  Location: [facility] ENDOSCOPY;  Service: Cardiovascular;  Laterality: N/A;   CHOLECYSTECTOMY     ENDARTERECTOMY Right 12/06/2017   Procedure: ENDARTERECTOMY CAROTID RIGHT;  Surgeon: Nada Libman, MD;  Location: [facility] OR;  Service: Vascular;  Laterality: Right;   ESOPHAGOGASTRODUODENOSCOPY (EGD) WITH PROPOFOL N/A 05/01/2023   Procedure: ESOPHAGOGASTRODUODENOSCOPY (EGD) WITH PROPOFOL;  Surgeon: Willis Modena, MD;  Location: [facility] ENDOSCOPY;  Service: Gastroenterology;  Laterality: N/A;   FINE NEEDLE ASPIRATION N/A 05/01/2023   Procedure: FINE NEEDLE ASPIRATION (FNA) LINEAR;  Surgeon: Willis Modena, MD;  Location: [facility] ENDOSCOPY;  Service: Gastroenterology;  Laterality: N/A;   KNEE SURGERY Bilateral 1982, 1988, 1997, 2000   x 4 times.   LOWER EXTREMITY ANGIOGRAPHY N/A 07/03/2019    Procedure: LOWER EXTREMITY ANGIOGRAPHY;  Surgeon: Sherren Kerns, MD;  Location: [facility] INVASIVE CV LAB;  Service: Cardiovascular;  Laterality: N/A;   LUMBAR LAMINECTOMY/DECOMPRESSION MICRODISCECTOMY Bilateral 09/09/2018   Procedure: Laminectomy and Foraminotomy bilateral Lumbar three-Lumbar four - Lumbar four-Lumbar five;  Surgeon: Julio Sicks, MD;  Location: [facility] OR;  Service: Neurosurgery;  Laterality: Bilateral;   PATCH ANGIOPLASTY Right 12/06/2017   Procedure: PATCH ANGIOPLASTY RIGHT CAROTID ARTERY;  Surgeon: Nada Libman, MD;  Location: [facility] OR;  Service: Vascular;  Laterality: Right;   PERIPHERAL VASCULAR INTERVENTION Right 01/25/2017   Procedure: Peripheral Vascular Intervention;  Surgeon: Sherren Kerns, MD;  Location: [facility] INVASIVE CV LAB;  Service: Cardiovascular;  Laterality: Right;   PERIPHERAL VASCULAR INTERVENTION Right 07/03/2019   Procedure: PERIPHERAL VASCULAR INTERVENTION;  Surgeon: Sherren Kerns, MD;  Location: [facility] INVASIVE CV LAB;  Service: Cardiovascular;  Laterality: Right;   PORTACATH PLACEMENT Left 05/14/2023   Procedure: INSERTION PORT-A-CATH;  Surgeon: Almond Lint, MD;  Location: [facility] OR;  Service: General;  Laterality: Left;   TONSILLECTOMY     UPPER ESOPHAGEAL ENDOSCOPIC ULTRASOUND (EUS) Bilateral 05/01/2023   Procedure: UPPER ESOPHAGEAL ENDOSCOPIC ULTRASOUND (EUS);  Surgeon: Willis Modena, MD;  Location: [facility] ENDOSCOPY;  Service: Gastroenterology;  Laterality: Bilateral;    I have reviewed the social history and family history with the patient and they are unchanged from previous note.  ALLERGIES:  is allergic to augmentin [amoxicillin-pot clavulanate].  MEDICATIONS:  Current Outpatient Medications  Medication Sig Dispense Refill   morphine (MS CONTIN) 15 MG 12 hr tablet Take 1 tablet (15 mg total) by mouth every 12 (twelve) hours. 30 tablet 0   amLODipine (NORVASC) 5 MG tablet Take 5 mg by mouth at bedtime.     aspirin EC 81 MG tablet Take 81 mg by mouth  daily.     diazepam (VALIUM) 2 MG tablet Take 2 mg by mouth 2 (two) times daily as needed for anxiety.     docusate sodium (COLACE) 100 MG capsule Take 200 mg by mouth daily.     ELIQUIS 5 MG TABS tablet TAKE 1 TABLET BY MOUTH TWICE DAILY. PLEASE KEEP UPCOMING APPOINTMENT 60 tablet 1   esomeprazole (NEXIUM) 40 MG capsule Take 40 mg by mouth daily as needed (heartburn/hiccups).     fenofibrate (TRICOR) 48 MG tablet Take 48 mg by mouth daily.     gabapentin (NEURONTIN) 300 MG capsule Take 900 mg by mouth 2 (two) times daily.     glipiZIDE (GLUCOTROL XL) 5 MG 24 hr tablet Take 5 mg by mouth daily after supper.     hyoscyamine (LEVSIN SL) 0.125 MG SL tablet Place 0.125 mg under the tongue every 4 (four) hours as needed for cramping.     Insulin Glargine (BASAGLAR KWIKPEN) 100 UNIT/ML Inject 20 Units into the skin daily.     JARDIANCE 25 MG TABS tablet Take 25 mg by mouth daily.     lidocaine-prilocaine (EMLA) cream Apply to affected area once 30 g 3   metFORMIN (GLUCOPHAGE-XR) 500 MG 24 hr tablet Take 1,500 mg by mouth daily after supper.     ondansetron (ZOFRAN) 4 MG tablet Take 4 mg by mouth every 8 (eight) hours as needed for nausea or vomiting.     ondansetron (ZOFRAN) 8 MG tablet Take 1 tablet (8 mg total) by mouth every 8 (eight) hours as needed for nausea or vomiting. Start on the third day after cisplatin 30 tablet 1   polyethylene glycol (MIRALAX / GLYCOLAX) 17 g packet Take 17 g by mouth daily as needed for moderate constipation.     prochlorperazine (COMPAZINE) 10 MG tablet Take 1 tablet (10 mg total) by mouth every 6 (six) hours as needed for nausea or vomiting (Nausea or vomiting). 30 tablet 1   rosuvastatin (CRESTOR) 40 MG tablet Take 40 mg by mouth daily.     sildenafil (VIAGRA) 100 MG tablet Take 100 mg by mouth as needed for erectile dysfunction.     spironolactone (ALDACTONE) 25 MG tablet Take 25 mg by mouth daily.     valsartan (DIOVAN) 320 MG tablet Take 320 mg by mouth daily.      No current facility-administered medications for this visit.   Facility-Administered Medications Ordered in Other Visits  Medication Dose Route Frequency Provider Last Rate Last Admin   atropine injection 0.5 mg  0.5 mg Intravenous Once PRN [name], MD       fluorouracil (ADRUCIL) 4,450 mg in sodium chloride 0.9 % 61 mL chemo infusion  2,400 mg/m2 (Treatment Plan Recorded) Intravenous 1 day or 1 dose [name], MD       irinotecan (CAMPTOSAR) 220 mg in sodium chloride 0.9 % 500 mL chemo infusion  120 mg/m2 (Treatment  Plan Recorded) Intravenous Once [name], MD 341 mL/hr at 05/29/23 1348 220 mg at 05/29/23 1348   leucovorin 744 mg in sodium chloride 0.9 % 250 mL infusion  400 mg/m2 (Treatment Plan Recorded) Intravenous Once [name], MD 191 mL/hr at 05/29/23 1346 744 mg at 05/29/23 1346   sodium chloride flush (NS) 0.9 % injection 10 mL  10 mL Intracatheter PRN [name], MD        PHYSICAL EXAMINATION: ECOG PERFORMANCE STATUS: 1 - Symptomatic but completely ambulatory  Vitals:   05/29/23 0938  BP: 133/68  Pulse: 70  Resp: 16  Temp: 98 F (36.7 C)  SpO2: 99%   Wt Readings from Last 3 Encounters:  05/29/23 145 lb (65.8 kg)  05/14/23 150 lb (68 kg)  05/10/23 151 lb 6.4 oz (68.7 kg)     GENERAL:alert, no distress and comfortable SKIN: skin color normal, no rashes or significant lesions EYES: normal, Conjunctiva are pink and non-injected, sclera clear  NEURO: alert & oriented x 3 with fluent speech  LABORATORY DATA:  I have reviewed the data as listed    Latest Ref Rng & Units 05/29/2023    8:56 AM 05/16/2023    9:13 AM 05/01/2023   11:26 AM  CBC  WBC 4.0 - 10.5 K/uL 7.2  10.3  8.5   Hemoglobin 13.0 - 17.0 g/dL 95.2  84.1  32.4   Hematocrit 39.0 - 52.0 % 42.6  44.4  43.5   Platelets 150 - 400 K/uL 259  239  207         Latest Ref Rng & Units 05/29/2023    8:56 AM 05/16/2023    9:13 AM 05/01/2023   11:26 AM  CMP  Glucose 70 - 99 mg/dL 401  027  94   BUN 8 - 23 mg/dL  18  22  18    Creatinine 0.61 - 1.24 mg/dL 2.53  6.64  4.03   Sodium 135 - 145 mmol/L 139  138  137   Potassium 3.5 - 5.1 mmol/L 3.6  4.0  3.8   Chloride 98 - 111 mmol/L 105  105  107   CO2 22 - 32 mmol/L 26  24  23    Calcium 8.9 - 10.3 mg/dL 9.0  9.0  8.6   Total Protein 6.5 - 8.1 g/dL 6.5  6.8  6.2   Total Bilirubin 0.3 - 1.2 mg/dL 0.4  0.7  0.5   Alkaline Phos 38 - 126 U/L 92  103  95   AST 15 - 41 U/L 37  68  144   ALT 0 - 44 U/L 85  107  156       RADIOGRAPHIC STUDIES: I have personally reviewed the radiological images as listed and agreed with the findings in the report. US THYROID  Result Date: 05/29/2023 CLINICAL DATA:  Thyroid nodule, hypermetabolic on PET-CT EXAM: THYROID ULTRASOUND TECHNIQUE: Ultrasound examination of the thyroid gland and adjacent soft tissues was performed. COMPARISON:  PET-CT 05/20/2023 FINDINGS: Parenchymal Echotexture: Mildly heterogenous Isthmus: 0.8 cm thickness Right lobe: 6 x 2.2 x 1.7 cm Left lobe: 5.4 x 2.2 x 2.2 cm _________________________________________________________ Estimated total number of nodules >/= 1 cm: 3 Number of spongiform nodules >/=  2 cm not described below (TR1): 0 Number of mixed cystic and solid nodules >/= 1.5 cm not described below (TR2): 0 _________________________________________________________ Nodule # 1: Location: Right; inferior Maximum size: 1.6 cm; Other 2 dimensions: 1.1 x 1.4 cm Composition: solid/almost completely solid (2) Echogenicity: hypoechoic (  2) Shape: not taller-than-wide (0) Margins: smooth (0) Echogenic foci: none (0) ACR TI-RADS total points: 4. ACR TI-RADS risk category: TR 4. ACR TI-RADS recommendations: **Given size (>/= 1.5 cm) and appearance, fine needle aspiration of this moderately suspicious nodule should be considered based on TI-RADS criteria. _________________________________________________________ Nodule # 2: Location: Left;   inferior Maximum size: 1.2 cm; Other 2 dimensions: 1 x 1.1 cm Composition:  solid/almost completely solid (2) Echogenicity: isoechoic (1) Shape: not taller-than-wide (0) Margins: smooth (0) Echogenic foci: none (0) ACR TI-RADS total points: 3. ACR TI-RADS risk category: TR 3. ACR TI-RADS recommendations: Given that this corresponds to the hypermetabolic lesion on PET-CT, biopsy is recommended. _________________________________________________________ Nodule # 3: Location: Left; mid-posterior Maximum size: 1.3 cm; Other 2 dimensions: 1 x 1.2 cm Composition: solid/almost completely solid (2) Echogenicity: isoechoic (1) Shape: not taller-than-wide (0) Margins: ill-defined (0) Echogenic foci: none (0) ACR TI-RADS total points: 3. ACR TI-RADS risk category: TR 3. ACR TI-RADS recommendations: Given size (<1.4 cm) and appearance, this nodule does NOT meet TI-RADS criteria for biopsy or dedicated follow-up. _________________________________________________________ No regional cervical adenopathy. IMPRESSION: 1. 1.6 cm right inferior thyroid nodule, TI-RADS category 4. Meets criteria for biopsy. 2. 1.2 cm left inferior thyroid nodule, TI-RADS category 3. Given hypermetabolic activity on PET-CT, biopsy recommended. The above is in keeping with the ACR TI-RADS recommendations - J Am Coll Radiol 2017;14:587-595. Electronically Signed   By: Corlis Leak M.D.   On: 05/29/2023 08:04      Orders Placed This Encounter  Procedures   CBC with Differential (Cancer Center Only)    Standing Status:   Future    Standing Expiration Date:   07/24/2024   CMP (Cancer Center only)    Standing Status:   Future    Standing Expiration Date:   07/24/2024   CBC with Differential (Cancer Center Only)    Standing Status:   Future    Standing Expiration Date:   07/10/2024   CMP (Cancer Center only)    Standing Status:   Future    Standing Expiration Date:   07/10/2024   All questions were answered. The patient knows to call the clinic with any problems, questions or concerns. No barriers to learning was  detected. The total time spent in the appointment was 30 minutes.     [name], MD 05/29/2023   Carolin Coy, CMA, am acting as scribe for [name], MD.   I have reviewed the above documentation for accuracy and completeness, and I agree with the above.

## 2023-05-29 NOTE — Progress Notes (Signed)
Per Dr Mosetta Putt, okay to proceed with treatment today with ALT 85.

## 2023-05-29 NOTE — Assessment & Plan Note (Addendum)
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  -baseline CA19.9 872 -EUS pancreatic mass biopsy confirmed adenocarcinoma, I reviewed with pt -He has been referred to [facility], appointment pending, I will check  -This is likely resectable disease.  I discussed option of upfront surgery and adjuvant chemotherapy, versus neoadjuvant chemotherapy followed by surgery.  We reviewed his case in tumor board, and recommended neoadjuvant chemotherapy.  Will repeat CT with pancreatic cancer protocol after neoadjuvant chemo. -Staging PET scan from 8/26 reviewed, no metastatic disease, there is some mild uptake in the periaortic lymph node, will monitor in the future scan.  I also discussed incidental finding of thyroid nodule, will order thyroid ultrasound for evaluation. -He started first cycle of chemotherapy FOLFIRINOX on May 16, 2023. He tolerated first cycle moderately well with fatigue, cold sensitivity and abdominal pain etc, discussed management of side effect. -Due to significant weight loss and fatigue after first cycle chemo, I reduced his oxaliplatin and irinotecan dose from cycle 2. -plan to repeat CT scan after C5 or C6 chemo

## 2023-05-31 ENCOUNTER — Other Ambulatory Visit: Payer: Self-pay

## 2023-05-31 ENCOUNTER — Inpatient Hospital Stay: Payer: Medicare Other

## 2023-05-31 VITALS — BP 129/67 | HR 60 | Temp 98.3°F | Resp 16

## 2023-05-31 DIAGNOSIS — C252 Malignant neoplasm of tail of pancreas: Secondary | ICD-10-CM

## 2023-05-31 MED ORDER — HEPARIN SOD (PORK) LOCK FLUSH 100 UNIT/ML IV SOLN: 500.0000 [IU] | Freq: Once | INTRAVENOUS | Status: DC | PRN

## 2023-05-31 MED ORDER — SODIUM CHLORIDE 0.9% FLUSH
10.0000 mL | INTRAVENOUS | Status: DC | PRN
  Administered 2023-05-31: 10 mL

## 2023-06-03 ENCOUNTER — Telehealth: Payer: Self-pay | Admitting: Nurse Practitioner

## 2023-06-03 ENCOUNTER — Other Ambulatory Visit: Payer: Self-pay

## 2023-06-03 ENCOUNTER — Telehealth: Payer: Self-pay

## 2023-06-03 ENCOUNTER — Other Ambulatory Visit: Payer: Self-pay | Admitting: Hematology

## 2023-06-03 DIAGNOSIS — C252 Malignant neoplasm of tail of pancreas: Secondary | ICD-10-CM

## 2023-06-03 MED ORDER — OXYCODONE HCL 5 MG PO TABS
5.0000 mg | ORAL_TABLET | Freq: Four times a day (QID) | ORAL | 0 refills | Status: AC | PRN
Start: 2023-06-03 — End: ?

## 2023-06-03 NOTE — Telephone Encounter (Signed)
Patient called in needing a refill of the Oxycodone 5-325 that was prescribed for him. Take 1-2 tablets every 8 hrs as needed for pain flare up along with the morphine 15mg .

## 2023-06-04 ENCOUNTER — Other Ambulatory Visit: Payer: Self-pay

## 2023-06-05 ENCOUNTER — Telehealth: Payer: Self-pay | Admitting: Nurse Practitioner

## 2023-06-05 NOTE — Telephone Encounter (Signed)
PAtient called to cancel 9/12 palliative care appointments. He feels that he is managing his pain and symptoms well and will reach out if he feels it is needed.

## 2023-06-06 ENCOUNTER — Inpatient Hospital Stay: Payer: Medicare Other

## 2023-06-07 ENCOUNTER — Encounter: Payer: Self-pay | Admitting: Hematology

## 2023-06-10 MED FILL — Dexamethasone Sodium Phosphate Inj 100 MG/10ML: INTRAMUSCULAR | Qty: 1 | Status: AC

## 2023-06-10 MED FILL — Fosaprepitant Dimeglumine For IV Infusion 150 MG (Base Eq): INTRAVENOUS | Qty: 5 | Status: AC

## 2023-06-10 NOTE — Assessment & Plan Note (Signed)
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  -baseline CA19.9 872 -EUS pancreatic mass biopsy confirmed adenocarcinoma, I reviewed with pt -He has been referred to [facility], appointment pending, I will check  -This is likely resectable disease.  I discussed option of upfront surgery and adjuvant chemotherapy, versus neoadjuvant chemotherapy followed by surgery.  We reviewed his case in tumor board, and recommended neoadjuvant chemotherapy.  Will repeat CT with pancreatic cancer protocol after neoadjuvant chemo. -Staging PET scan from 8/26 reviewed, no metastatic disease, there is some mild uptake in the periaortic lymph node, will monitor in the future scan.  I also discussed incidental finding of thyroid nodule, will order thyroid ultrasound for evaluation. -He started first cycle of chemotherapy FOLFIRINOX on May 16, 2023. He tolerated first cycle moderately well with fatigue, cold sensitivity and abdominal pain etc, discussed management of side effect. -Due to significant weight loss and fatigue after first cycle chemo, I reduced his oxaliplatin and irinotecan dose from cycle 2. -plan to repeat CT scan after C5 or C6 chemo

## 2023-06-10 NOTE — Assessment & Plan Note (Signed)
-  Patient has chronic back pain, on hydrocodone, managed by his PCP -His abdominal pain is related to his pancreatic cancer -Due to worsening pain, he started MS Contin 15 mg twice daily, and to use oxycodone as needed for breakthrough pain.

## 2023-06-11 ENCOUNTER — Inpatient Hospital Stay: Payer: Medicare Other | Admitting: Dietician

## 2023-06-11 ENCOUNTER — Inpatient Hospital Stay (HOSPITAL_BASED_OUTPATIENT_CLINIC_OR_DEPARTMENT_OTHER): Payer: Medicare Other | Admitting: Hematology

## 2023-06-11 ENCOUNTER — Inpatient Hospital Stay: Payer: Medicare Other

## 2023-06-11 ENCOUNTER — Other Ambulatory Visit: Payer: Self-pay | Admitting: Hematology

## 2023-06-11 ENCOUNTER — Encounter: Payer: Self-pay | Admitting: Hematology

## 2023-06-11 VITALS — BP 128/62 | HR 57 | Temp 98.2°F | Resp 18 | Ht 70.0 in | Wt 142.8 lb

## 2023-06-11 VITALS — BP 141/72 | HR 60

## 2023-06-11 DIAGNOSIS — C252 Malignant neoplasm of tail of pancreas: Secondary | ICD-10-CM

## 2023-06-11 DIAGNOSIS — E041 Nontoxic single thyroid nodule: Secondary | ICD-10-CM | POA: Diagnosis not present

## 2023-06-11 DIAGNOSIS — Z95828 Presence of other vascular implants and grafts: Secondary | ICD-10-CM

## 2023-06-11 DIAGNOSIS — Z5111 Encounter for antineoplastic chemotherapy: Secondary | ICD-10-CM | POA: Diagnosis not present

## 2023-06-11 DIAGNOSIS — G893 Neoplasm related pain (acute) (chronic): Secondary | ICD-10-CM | POA: Diagnosis not present

## 2023-06-11 LAB — CMP (CANCER CENTER ONLY)
ALT: 22 U/L (ref 0–44)
AST: 12 U/L — ABNORMAL LOW (ref 15–41)
Albumin: 3.8 g/dL (ref 3.5–5.0)
Alkaline Phosphatase: 80 U/L (ref 38–126)
Anion gap: 5 (ref 5–15)
BUN: 19 mg/dL (ref 8–23)
CO2: 26 mmol/L (ref 22–32)
Calcium: 8.8 mg/dL — ABNORMAL LOW (ref 8.9–10.3)
Chloride: 108 mmol/L (ref 98–111)
Creatinine: 0.83 mg/dL (ref 0.61–1.24)
GFR, Estimated: >60 mL/min (ref >60)
Glucose, Bld: 109 mg/dL — ABNORMAL HIGH (ref 70–99)
Potassium: 4 mmol/L (ref 3.5–5.1)
Sodium: 139 mmol/L (ref 135–145)
Total Bilirubin: 0.4 mg/dL (ref 0.3–1.2)
Total Protein: 6.3 g/dL — ABNORMAL LOW (ref 6.5–8.1)

## 2023-06-11 LAB — CBC WITH DIFFERENTIAL (CANCER CENTER ONLY)
Abs Immature Granulocytes: 0.01 10*3/uL (ref 0.00–0.07)
Basophils Absolute: 0 10*3/uL (ref 0.0–0.1)
Basophils Relative: 1 %
Eosinophils Absolute: 0.1 10*3/uL (ref 0.0–0.5)
Eosinophils Relative: 1 %
HCT: 40.5 % (ref 39.0–52.0)
Hemoglobin: 13.8 g/dL (ref 13.0–17.0)
Immature Granulocytes: 0 %
Lymphocytes Relative: 28 %
Lymphs Abs: 1.7 10*3/uL (ref 0.7–4.0)
MCH: 29.9 pg (ref 26.0–34.0)
MCHC: 34.1 g/dL (ref 30.0–36.0)
MCV: 87.7 fL (ref 80.0–100.0)
Monocytes Absolute: 0.9 10*3/uL (ref 0.1–1.0)
Monocytes Relative: 14 %
Neutro Abs: 3.5 10*3/uL (ref 1.7–7.7)
Neutrophils Relative %: 56 %
Platelet Count: 189 10*3/uL (ref 150–400)
RBC: 4.62 MIL/uL (ref 4.22–5.81)
RDW: 14.5 % (ref 11.5–15.5)
WBC Count: 6.2 10*3/uL (ref 4.0–10.5)
nRBC: 0 % (ref 0.0–0.2)

## 2023-06-11 MED ORDER — PALONOSETRON HCL INJECTION 0.25 MG/5ML
0.2500 mg | Freq: Once | INTRAVENOUS | Status: AC
  Administered 2023-06-11: 0.25 mg via INTRAVENOUS
  Filled 2023-06-11: qty 5

## 2023-06-11 MED ORDER — SODIUM CHLORIDE 0.9% FLUSH
10.0000 mL | Freq: Once | INTRAVENOUS | Status: AC
  Administered 2023-06-11: 10 mL

## 2023-06-11 MED ORDER — OXALIPLATIN CHEMO INJECTION 100 MG/20ML
70.0000 mg/m2 | Freq: Once | INTRAVENOUS | Status: AC
  Administered 2023-06-11: 130 mg via INTRAVENOUS
  Filled 2023-06-11: qty 6

## 2023-06-11 MED ORDER — MORPHINE SULFATE ER 15 MG PO TBCR: 15.0000 mg | EXTENDED_RELEASE_TABLET | Freq: Two times a day (BID) | ORAL | 0 refills | Status: DC

## 2023-06-11 MED ORDER — SODIUM CHLORIDE 0.9 % IV SOLN
2400.0000 mg/m2 | INTRAVENOUS | Status: DC
  Administered 2023-06-11: 4450 mg via INTRAVENOUS
  Filled 2023-06-11: qty 89

## 2023-06-11 MED ORDER — ATROPINE SULFATE 1 MG/ML IV SOLN
0.5000 mg | Freq: Once | INTRAVENOUS | Status: AC | PRN
  Administered 2023-06-11: 0.5 mg via INTRAVENOUS
  Filled 2023-06-11: qty 1

## 2023-06-11 MED ORDER — OXYCODONE-ACETAMINOPHEN 5-325 MG PO TABS
1.0000 | ORAL_TABLET | Freq: Three times a day (TID) | ORAL | 0 refills | Status: AC | PRN
Start: 2023-06-11 — End: ?

## 2023-06-11 MED ORDER — HEPARIN SOD (PORK) LOCK FLUSH 100 UNIT/ML IV SOLN: 500.0000 [IU] | Freq: Once | INTRAVENOUS | Status: DC | PRN

## 2023-06-11 MED ORDER — SODIUM CHLORIDE 0.9 % IV SOLN
150.0000 mg | Freq: Once | INTRAVENOUS | Status: AC
  Administered 2023-06-11: 150 mg via INTRAVENOUS
  Filled 2023-06-11: qty 150
  Filled 2023-06-11: qty 5

## 2023-06-11 MED ORDER — SODIUM CHLORIDE 0.9 % IV SOLN
10.0000 mg | Freq: Once | INTRAVENOUS | Status: AC
  Administered 2023-06-11: 10 mg via INTRAVENOUS
  Filled 2023-06-11: qty 10
  Filled 2023-06-11: qty 1

## 2023-06-11 MED ORDER — SODIUM CHLORIDE 0.9% FLUSH
10.0000 mL | INTRAVENOUS | Status: DC | PRN
  Administered 2023-06-11: 10 mL

## 2023-06-11 MED ORDER — DEXTROSE 5 % IV SOLN: Freq: Once | INTRAVENOUS | Status: AC

## 2023-06-11 MED ORDER — SODIUM CHLORIDE 0.9 % IV SOLN
120.0000 mg/m2 | Freq: Once | INTRAVENOUS | Status: AC
  Administered 2023-06-11: 220 mg via INTRAVENOUS
  Filled 2023-06-11: qty 11

## 2023-06-11 MED ORDER — SODIUM CHLORIDE 0.9% FLUSH: 10.0000 mL | Freq: Once | INTRAVENOUS | Status: DC

## 2023-06-11 MED ORDER — SODIUM CHLORIDE 0.9 % IV SOLN
400.0000 mg/m2 | Freq: Once | INTRAVENOUS | Status: AC
  Administered 2023-06-11: 744 mg via INTRAVENOUS
  Filled 2023-06-11: qty 25

## 2023-06-11 NOTE — Patient Instructions (Signed)
[place] [facility]  Discharge Instructions: Thank you for choosing [facility] to provide your oncology and hematology care.   If you have a lab appointment with the Cancer Center, please go directly to the Cancer Center and check in at the registration area.   Wear comfortable clothing and clothing appropriate for easy access to any Portacath or PICC line.   We strive to give you quality time with your provider. You may need to reschedule your appointment if you arrive late (15 or more minutes).  Arriving late affects you and other patients whose appointments are after yours.  Also, if you miss three or more appointments without notifying the office, you may be dismissed from the clinic at the provider's discretion.      For prescription refill requests, have your pharmacy contact our office and allow 72 hours for refills to be completed.    Today you received the following chemotherapy and/or immunotherapy agents: Oxaliplatin, Leucovorin, Irinotecan, 5FU      To help prevent nausea and vomiting after your treatment, we encourage you to take your nausea medication as directed.  BELOW ARE SYMPTOMS THAT SHOULD BE REPORTED IMMEDIATELY: *FEVER GREATER THAN 100.4 F (38 C) OR HIGHER *CHILLS OR SWEATING *NAUSEA AND VOMITING THAT IS NOT CONTROLLED WITH YOUR NAUSEA MEDICATION *UNUSUAL SHORTNESS OF BREATH *UNUSUAL BRUISING OR BLEEDING *URINARY PROBLEMS (pain or burning when urinating, or frequent urination) *BOWEL PROBLEMS (unusual diarrhea, constipation, pain near the anus) TENDERNESS IN MOUTH AND THROAT WITH OR WITHOUT PRESENCE OF ULCERS (sore throat, sores in mouth, or a toothache) UNUSUAL RASH, SWELLING OR PAIN  UNUSUAL VAGINAL DISCHARGE OR ITCHING   Items with * indicate a potential emergency and should be followed up as soon as possible or go to the Emergency Department if any problems should occur.  Please show the CHEMOTHERAPY ALERT CARD or  IMMUNOTHERAPY ALERT CARD at check-in to the Emergency Department and triage nurse.  Should you have questions after your visit or need to cancel or reschedule your appointment, please contact [place] [facility]  Dept: [phone]  and follow the prompts.  Office hours are 8:00 a.m. to 4:30 p.m. Monday - Friday. Please note that voicemails left after 4:00 p.m. may not be returned until the following business day.  We are closed weekends and major holidays. You have access to a nurse at all times for urgent questions. Please call the main number to the clinic Dept: [phone] and follow the prompts.   For any non-urgent questions, you may also contact your provider using MyChart. We now offer e-Visits for anyone 75 and older to request care online for non-urgent symptoms. For details visit [email or web address].   Also download the MyChart app! Go to the app store, search "MyChart", open the app, select [place], and log in with your MyChart username and password.

## 2023-06-11 NOTE — Progress Notes (Signed)
Nutrition Assessment   Reason for Assessment: MD referral (wt loss)   ASSESSMENT: 72 year old male with stage IIA pancreatic cancer. He is receiving neoadjuvant modified Folfirinox (start 8/22). Pt is under the care of Dr. Mosetta Putt.   Past medical history includes CAD, atrial fibrillation, CAD, PVD, CVA, IBS, GERD with esophagitis, IDDM2, radiculopathy of lumbosacral region, arthritis, HLD, tobacco dependence, chronic pain  Met with pt and wife in infusion. Pt reports appetite and intake have improved some. Pt eating small portions and drinking one ensure complete. Recalls boiled eggs, salmon, half sandwich, milk). He endorses altered taste. This changes from day to day. Mouth sores have improved. He is using baking soda salt water rinses daily. Pt has mild cold sensitivity. Nausea well controlled with zofran. He relates nausea to change in pain medication prior to starting treatment. He is taking miralax as needed for constipation. Pt has bilateral lower extremity weakness s/p back surgery. States they feel like jello.   Nutrition Focused Physical Exam: deferred    Medications: amlodipine, valium, colace, eliquis, nexium, gabapentin, insulin glargine, ms contin, zofran, roxicodone, percocet, miralax, crestor, aldactone, diovan, compazine   Labs: reviewed   Anthropometrics:  Height: 5'10" Weight: 142 lb 12.8 oz  UBW: 162 lb (April) BMI: 20.49   NUTRITION DIAGNOSIS: Unintended wt loss related to pancreatic cancer as evidenced by 12% decrease from usual wt in 5 months - severe for time frame   INTERVENTION:  Educated on eating small frequent meals vs 3 larger meals, encouraged bites/sips q2h Discussed ways of adding calories and protein to foods + balanced snack ideas for blood sugar management Encouraged soft moist high protein foods for ease of intake Educated on strategies for taste changes, suggested trying baking soda salt water rinses before eating  Continue drinking Ensure,  recommend 2/day - samples of Boost, Ensure, CIB + coupons provided  Encouraged activity as able  Contact information provided    MONITORING, EVALUATION, GOAL: Pt will tolerate increased calories and protein to minimize further wt loss during treatment   Next Visit: Wednesday October 2 during infusion

## 2023-06-11 NOTE — Progress Notes (Signed)
[facility]   [phone] [phone]   Clinic Follow up Note   Patient Care Team: [name], MD as PCP - General [name], [name], MD as PCP - Cardiology (Cardiology) [name], MD as Consulting Physician (Hematology)  Date of Service:  06/11/2023  CHIEF COMPLAINT: f/u of Pancreatic Cancer   CURRENT THERAPY:  FOLFIRINOX   ASSESSMENT:  Scott [name] is a 71 y.o. male with   Cancer related pain -Patient has chronic back pain, on hydrocodone, managed by his PCP -His abdominal pain is related to his pancreatic cancer -Due to worsening pain, he started MS Contin 15 mg twice daily, and to use oxycodone as needed for breakthrough pain.  Pancreatic cancer (HCC) -cT3N0M0, stage IIA --Patient presented with abdominal pain and weight loss, CT scan revealed a 4.4 x 3.5 cm ill-defined mass in the pancreatic tail, which 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  -baseline CA19.9 872 -EUS pancreatic mass biopsy confirmed adenocarcinoma, I reviewed with pt -He has been referred to [facility], appointment pending, I will check  -This is likely resectable disease.  I discussed option of upfront surgery and adjuvant chemotherapy, versus neoadjuvant chemotherapy followed by surgery.  We reviewed his case in tumor board, and recommended neoadjuvant chemotherapy.  Will repeat CT with pancreatic cancer protocol after neoadjuvant chemo. -Staging PET scan from 8/26 reviewed, no metastatic disease, there is some mild uptake in the periaortic lymph node, will monitor in the future scan.  I also discussed incidental finding of thyroid nodule, will order thyroid ultrasound for evaluation. -He started first cycle of chemotherapy FOLFIRINOX on May 16, 2023. He tolerated first cycle moderately well with fatigue, cold sensitivity and abdominal pain etc, discussed management of side effect. -Due to significant weight  loss and fatigue after first cycle chemo, I reduced his oxaliplatin and irinotecan dose from cycle 2. -plan to repeat CT scan after C5 or C6 chemo  -will repeat CA19.9 next time   Thyroid nodule -Found his staging PET scan -Thyroid ultrasound showed a 1.6 cm right inferior thyroid nodule, and 1.2 cm left inferior thyroid nodule, biopsy were recommended for both -I ordered a thyroid nodule biopsy for him today  Weight loss -Secondary to chemotherapy and malignancy -I recommend high-protein high-calorie food -He is scheduled to meet a dietitian today.  PLAN: - I order thyroid Biopsy - I refill Oxycodone -I refill MS contin for pain - Pt has appointment with Dietician today. -proceed Folfirinox-Oxaliplatin/Irinotecan At same reduce dose today due to fatigue and weight loss. -lab/flush ad f/u on 10/2  SUMMARY OF ONCOLOGIC HISTORY: Oncology History Overview Note   Cancer Staging  Pancreatic cancer [facility]) Staging form: Exocrine Pancreas, AJCC 8th Edition - Clinical stage from 05/01/2023: Stage III (cT3, cN2, cM0) - Signed by [name], MD on 05/03/2023     Pancreatic cancer (HCC)  05/01/2023 Cancer Staging   Staging form: Exocrine Pancreas, AJCC 8th Edition - Clinical stage from 05/01/2023: Stage III (cT3, cN2, cM0) - Signed by [name], MD on 05/03/2023   05/02/2023 Initial Diagnosis   Pancreatic cancer (HCC)   05/16/2023 -  Chemotherapy   Patient is on Treatment Plan : PANCREAS Modified FOLFIRINOX q14d x 4 cycles        INTERVAL HISTORY:  Scott [name] is here for a follow up of Pancreatic Cancer. He was last seen by me on 05/29/2023. He presents to the clinic accompanied by wife. Pt that he  had some abdominal pain and he takes the morphine 2 times a day, and in between he takes Oxycodone 3-5 tablets a day it depends. Pt had questions concerning his health. Pt state that when he is mobile, he has abdominal pain and it increases the pain.     All other systems were reviewed with the  patient and are negative.  MEDICAL HISTORY:  Past Medical History:  Diagnosis Date   CAD (coronary artery disease)    Cerebrovascular disease    Diabetes mellitus without complication (HCC)    HLD (hyperlipidemia)    HTN (hypertension)    IBS (irritable bowel syndrome)    Patient states did not have   Lumbar stenosis    MI, old    Persistent atrial fibrillation (HCC)    PVD (peripheral vascular disease) (HCC)    Stroke (cerebrum) (HCC)    Tobacco abuse     SURGICAL HISTORY: Past Surgical History:  Procedure Laterality Date   ABDOMINAL AORTOGRAM W/LOWER EXTREMITY N/A 01/25/2017   Procedure: Abdominal Aortogram w/Lower Extremity;  Surgeon: Sherren Kerns, MD;  Location: [facility] INVASIVE CV LAB;  Service: Cardiovascular;  Laterality: N/A;   ANTERIOR CRUCIATE LIGAMENT REPAIR     CARDIAC CATHETERIZATION     CONE   CARDIAC CATHETERIZATION N/A 07/27/2016   Procedure: Left Heart Cath and Coronary Angiography;  Surgeon: Tonny Bollman, MD;  Location: [facility] INVASIVE CV LAB;  Service: Cardiovascular;  Laterality: N/A;   CARDIOVERSION N/A 06/29/2016   Procedure: CARDIOVERSION;  Surgeon: Pricilla Riffle, MD;  Location: [facility] ENDOSCOPY;  Service: Cardiovascular;  Laterality: N/A;   CARDIOVERSION N/A 03/05/2018   Procedure: CARDIOVERSION;  Surgeon: Laurey Morale, MD;  Location: [facility] ENDOSCOPY;  Service: Cardiovascular;  Laterality: N/A;   CHOLECYSTECTOMY     ENDARTERECTOMY Right 12/06/2017   Procedure: ENDARTERECTOMY CAROTID RIGHT;  Surgeon: Nada Libman, MD;  Location: [facility] OR;  Service: Vascular;  Laterality: Right;   ESOPHAGOGASTRODUODENOSCOPY (EGD) WITH PROPOFOL N/A 05/01/2023   Procedure: ESOPHAGOGASTRODUODENOSCOPY (EGD) WITH PROPOFOL;  Surgeon: Willis Modena, MD;  Location: [facility] ENDOSCOPY;  Service: Gastroenterology;  Laterality: N/A;   FINE NEEDLE ASPIRATION N/A 05/01/2023   Procedure: FINE NEEDLE ASPIRATION (FNA) LINEAR;  Surgeon: Willis Modena, MD;  Location: [facility] ENDOSCOPY;  Service:  Gastroenterology;  Laterality: N/A;   KNEE SURGERY Bilateral 1982, 1988, 1997, 2000   x 4 times.   LOWER EXTREMITY ANGIOGRAPHY N/A 07/03/2019   Procedure: LOWER EXTREMITY ANGIOGRAPHY;  Surgeon: Sherren Kerns, MD;  Location: [facility] INVASIVE CV LAB;  Service: Cardiovascular;  Laterality: N/A;   LUMBAR LAMINECTOMY/DECOMPRESSION MICRODISCECTOMY Bilateral 09/09/2018   Procedure: Laminectomy and Foraminotomy bilateral Lumbar three-Lumbar four - Lumbar four-Lumbar five;  Surgeon: Julio Sicks, MD;  Location: [facility] OR;  Service: Neurosurgery;  Laterality: Bilateral;   PATCH ANGIOPLASTY Right 12/06/2017   Procedure: PATCH ANGIOPLASTY RIGHT CAROTID ARTERY;  Surgeon: Nada Libman, MD;  Location: [facility] OR;  Service: Vascular;  Laterality: Right;   PERIPHERAL VASCULAR INTERVENTION Right 01/25/2017   Procedure: Peripheral Vascular Intervention;  Surgeon: Sherren Kerns, MD;  Location: [facility] INVASIVE CV LAB;  Service: Cardiovascular;  Laterality: Right;   PERIPHERAL VASCULAR INTERVENTION Right 07/03/2019   Procedure: PERIPHERAL VASCULAR INTERVENTION;  Surgeon: Sherren Kerns, MD;  Location: [facility] INVASIVE CV LAB;  Service: Cardiovascular;  Laterality: Right;   PORTACATH PLACEMENT Left 05/14/2023   Procedure: INSERTION PORT-A-CATH;  Surgeon: Almond Lint, MD;  Location: [facility] OR;  Service: General;  Laterality: Left;   TONSILLECTOMY     UPPER ESOPHAGEAL ENDOSCOPIC ULTRASOUND (  EUS) Bilateral 05/01/2023   Procedure: UPPER ESOPHAGEAL ENDOSCOPIC ULTRASOUND (EUS);  Surgeon: Willis Modena, MD;  Location: [facility] ENDOSCOPY;  Service: Gastroenterology;  Laterality: Bilateral;    I have reviewed the social history and family history with the patient and they are unchanged from previous note.  ALLERGIES:  is allergic to augmentin [amoxicillin-pot clavulanate].  MEDICATIONS:  Current Outpatient Medications  Medication Sig Dispense Refill   oxyCODONE-acetaminophen (PERCOCET/ROXICET) 5-325 MG tablet Take 1 tablet by mouth  every 8 (eight) hours as needed for severe pain. 10 tablet 0   amLODipine (NORVASC) 5 MG tablet Take 5 mg by mouth at bedtime.     aspirin EC 81 MG tablet Take 81 mg by mouth daily.     diazepam (VALIUM) 2 MG tablet Take 2 mg by mouth 2 (two) times daily as needed for anxiety.     docusate sodium (COLACE) 100 MG capsule Take 200 mg by mouth daily.     ELIQUIS 5 MG TABS tablet TAKE 1 TABLET BY MOUTH TWICE DAILY. PLEASE KEEP UPCOMING APPOINTMENT 60 tablet 1   esomeprazole (NEXIUM) 40 MG capsule Take 40 mg by mouth daily as needed (heartburn/hiccups).     fenofibrate (TRICOR) 48 MG tablet Take 48 mg by mouth daily.     gabapentin (NEURONTIN) 300 MG capsule Take 900 mg by mouth 2 (two) times daily.     glipiZIDE (GLUCOTROL XL) 5 MG 24 hr tablet Take 5 mg by mouth daily after supper.     hyoscyamine (LEVSIN SL) 0.125 MG SL tablet Place 0.125 mg under the tongue every 4 (four) hours as needed for cramping.     Insulin Glargine (BASAGLAR KWIKPEN) 100 UNIT/ML Inject 20 Units into the skin daily.     JARDIANCE 25 MG TABS tablet Take 25 mg by mouth daily.     lidocaine-prilocaine (EMLA) cream Apply to affected area once 30 g 3   metFORMIN (GLUCOPHAGE-XR) 500 MG 24 hr tablet Take 1,500 mg by mouth daily after supper.     morphine (MS CONTIN) 15 MG 12 hr tablet Take 1 tablet (15 mg total) by mouth every 12 (twelve) hours. 60 tablet 0   ondansetron (ZOFRAN) 4 MG tablet Take 4 mg by mouth every 8 (eight) hours as needed for nausea or vomiting.     ondansetron (ZOFRAN) 8 MG tablet Take 1 tablet (8 mg total) by mouth every 8 (eight) hours as needed for nausea or vomiting. Start on the third day after cisplatin 30 tablet 1   oxyCODONE (OXY IR/ROXICODONE) 5 MG immediate release tablet Take 1-2 tablets (5-10 mg total) by mouth every 6 (six) hours as needed for severe pain. 90 tablet 0   polyethylene glycol (MIRALAX / GLYCOLAX) 17 g packet Take 17 g by mouth daily as needed for moderate constipation.      prochlorperazine (COMPAZINE) 10 MG tablet Take 1 tablet (10 mg total) by mouth every 6 (six) hours as needed for nausea or vomiting (Nausea or vomiting). 30 tablet 1   rosuvastatin (CRESTOR) 40 MG tablet Take 40 mg by mouth daily.     sildenafil (VIAGRA) 100 MG tablet Take 100 mg by mouth as needed for erectile dysfunction.     spironolactone (ALDACTONE) 25 MG tablet Take 25 mg by mouth daily.     valsartan (DIOVAN) 320 MG tablet Take 320 mg by mouth daily.     No current facility-administered medications for this visit.   Facility-Administered Medications Ordered in Other Visits  Medication Dose Route Frequency Provider Last  Rate Last Admin   dexamethasone (DECADRON) 10 mg in sodium chloride 0.9 % 50 mL IVPB  10 mg Intravenous Once [name], MD 204 mL/hr at 06/11/23 1040 10 mg at 06/11/23 1040   fluorouracil (ADRUCIL) 4,450 mg in sodium chloride 0.9 % 61 mL chemo infusion  2,400 mg/m2 (Treatment Plan Recorded) Intravenous 1 day or 1 dose [name], MD       fosaprepitant (EMEND) 150 mg in sodium chloride 0.9 % 145 mL IVPB  150 mg Intravenous Once [name], MD 450 mL/hr at 06/11/23 1039 150 mg at 06/11/23 1039   heparin lock flush 100 unit/mL  500 Units Intracatheter Once PRN [name], MD       irinotecan (CAMPTOSAR) 220 mg in sodium chloride 0.9 % 500 mL chemo infusion  120 mg/m2 (Treatment Plan Recorded) Intravenous Once [name], MD       leucovorin 744 mg in sodium chloride 0.9 % 250 mL infusion  400 mg/m2 (Treatment Plan Recorded) Intravenous Once [name], MD       oxaliplatin (ELOXATIN) 130 mg in dextrose 5 % 500 mL chemo infusion  70 mg/m2 (Treatment Plan Recorded) Intravenous Once [name], MD       sodium chloride flush (NS) 0.9 % injection 10 mL  10 mL Intracatheter PRN [name], MD        PHYSICAL EXAMINATION: ECOG PERFORMANCE STATUS: 1 - Symptomatic but completely ambulatory  Vitals:   06/11/23 0955  BP: 128/62  Pulse: (!) 57  Resp: 18  Temp: 98.2 F (36.8 C)  SpO2:  99%   Wt Readings from Last 3 Encounters:  06/11/23 142 lb 12.8 oz (64.8 kg)  05/29/23 145 lb (65.8 kg)  05/14/23 150 lb (68 kg)     GENERAL:alert, no distress and comfortable SKIN: skin color normal, no rashes or significant lesions EYES: normal, Conjunctiva are pink and non-injected, sclera clear  NEURO: alert & oriented x 3 with fluent speech LABORATORY DATA:  I have reviewed the data as listed    Latest Ref Rng & Units 06/11/2023    9:35 AM 05/29/2023    8:56 AM 05/16/2023    9:13 AM  CBC  WBC 4.0 - 10.5 K/uL 6.2  7.2  10.3   Hemoglobin 13.0 - 17.0 g/dL 64.4  03.4  74.2   Hematocrit 39.0 - 52.0 % 40.5  42.6  44.4   Platelets 150 - 400 K/uL 189  259  239         Latest Ref Rng & Units 06/11/2023    9:35 AM 05/29/2023    8:56 AM 05/16/2023    9:13 AM  CMP  Glucose 70 - 99 mg/dL 595  638  756   BUN 8 - 23 mg/dL 19  18  22    Creatinine 0.61 - 1.24 mg/dL 4.33  2.95  1.88   Sodium 135 - 145 mmol/L 139  139  138   Potassium 3.5 - 5.1 mmol/L 4.0  3.6  4.0   Chloride 98 - 111 mmol/L 108  105  105   CO2 22 - 32 mmol/L 26  26  24    Calcium 8.9 - 10.3 mg/dL 8.8  9.0  9.0   Total Protein 6.5 - 8.1 g/dL 6.3  6.5  6.8   Total Bilirubin 0.3 - 1.2 mg/dL 0.4  0.4  0.7   Alkaline Phos 38 - 126 U/L 80  92  103   AST 15 - 41 U/L 12  37  68  ALT 0 - 44 U/L 22  85  107       RADIOGRAPHIC STUDIES: I have personally reviewed the radiological images as listed and agreed with the findings in the report. No results found.    Orders Placed This Encounter  Procedures   [place] FNA BX THYROID 1ST LESION AFIRMA    Standing Status:   Future    Standing Expiration Date:   06/10/2024    Order Specific Question:   Reason for Exam (SYMPTOM  OR DIAGNOSIS REQUIRED)    Answer:   rule out malignancy    Order Specific Question:   Preferred location?    Answer:   [facility]   All questions were answered. The patient knows to call the clinic with any problems, questions or concerns. No barriers  to learning was detected. The total time spent in the appointment was 40 minutes.     [name], MD 06/11/2023   Carolin Coy, CMA, am acting as scribe for [name], MD.   I have reviewed the above documentation for accuracy and completeness, and I agree with the above.

## 2023-06-12 ENCOUNTER — Ambulatory Visit: Payer: Medicare Other | Admitting: Hematology

## 2023-06-12 ENCOUNTER — Ambulatory Visit: Payer: Medicare Other

## 2023-06-12 ENCOUNTER — Other Ambulatory Visit: Payer: Medicare Other

## 2023-06-13 ENCOUNTER — Other Ambulatory Visit: Payer: Self-pay

## 2023-06-13 ENCOUNTER — Inpatient Hospital Stay: Payer: Medicare Other

## 2023-06-13 VITALS — BP 125/73 | HR 68 | Temp 98.7°F | Resp 18

## 2023-06-13 DIAGNOSIS — C252 Malignant neoplasm of tail of pancreas: Secondary | ICD-10-CM

## 2023-06-13 DIAGNOSIS — Z5111 Encounter for antineoplastic chemotherapy: Secondary | ICD-10-CM | POA: Diagnosis not present

## 2023-06-13 MED ORDER — SODIUM CHLORIDE 0.9% FLUSH
10.0000 mL | INTRAVENOUS | Status: DC | PRN
  Administered 2023-06-13: 10 mL

## 2023-06-13 MED ORDER — HEPARIN SOD (PORK) LOCK FLUSH 100 UNIT/ML IV SOLN
500.0000 [IU] | Freq: Once | INTRAVENOUS | Status: AC | PRN
  Administered 2023-06-13: 500 [IU]

## 2023-06-17 ENCOUNTER — Other Ambulatory Visit: Payer: Self-pay

## 2023-06-18 ENCOUNTER — Other Ambulatory Visit: Payer: Self-pay

## 2023-06-25 MED FILL — Dexamethasone Sodium Phosphate Inj 100 MG/10ML: INTRAMUSCULAR | Qty: 1 | Status: AC

## 2023-06-25 MED FILL — Fosaprepitant Dimeglumine For IV Infusion 150 MG (Base Eq): INTRAVENOUS | Qty: 5 | Status: AC

## 2023-06-25 NOTE — Assessment & Plan Note (Signed)
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  -baseline CA19.9 872 -EUS pancreatic mass biopsy confirmed adenocarcinoma. -He has seen surgeon [name] -We reviewed his case in tumor board, and recommended neoadjuvant chemotherapy.  Will repeat CT with pancreatic cancer protocol after neoadjuvant chemo. -Staging PET scan from 8/26 reviewed, no metastatic disease, there is some mild uptake in the periaortic lymph node, will monitor in the future scan.  I also discussed incidental finding of thyroid nodule, I ordered thyroid ultrasound for evaluation. -He started first cycle of chemotherapy FOLFIRINOX on May 16, 2023. He tolerated first cycle moderately well with fatigue, cold sensitivity and abdominal pain etc, discussed management of side effect. -Due to significant weight loss and fatigue after first cycle chemo, I reduced his oxaliplatin and irinotecan dose from cycle 2. -plan to repeat CT scan after C5 or C6 chemo

## 2023-06-26 ENCOUNTER — Inpatient Hospital Stay: Payer: Medicare Other | Admitting: Dietician

## 2023-06-26 ENCOUNTER — Other Ambulatory Visit: Payer: Self-pay

## 2023-06-26 ENCOUNTER — Inpatient Hospital Stay: Payer: Medicare Other

## 2023-06-26 ENCOUNTER — Encounter: Payer: Self-pay | Admitting: Hematology

## 2023-06-26 ENCOUNTER — Inpatient Hospital Stay: Payer: Medicare Other | Attending: Hematology

## 2023-06-26 ENCOUNTER — Inpatient Hospital Stay: Payer: Medicare Other | Attending: Hematology | Admitting: Hematology

## 2023-06-26 VITALS — BP 113/71 | HR 67 | Temp 98.0°F | Resp 17 | Ht 70.0 in | Wt 140.2 lb

## 2023-06-26 DIAGNOSIS — Z95828 Presence of other vascular implants and grafts: Secondary | ICD-10-CM

## 2023-06-26 DIAGNOSIS — Z79899 Other long term (current) drug therapy: Secondary | ICD-10-CM | POA: Insufficient documentation

## 2023-06-26 DIAGNOSIS — Z5111 Encounter for antineoplastic chemotherapy: Secondary | ICD-10-CM | POA: Insufficient documentation

## 2023-06-26 DIAGNOSIS — Z23 Encounter for immunization: Secondary | ICD-10-CM | POA: Diagnosis not present

## 2023-06-26 DIAGNOSIS — K8689 Other specified diseases of pancreas: Secondary | ICD-10-CM

## 2023-06-26 DIAGNOSIS — C252 Malignant neoplasm of tail of pancreas: Secondary | ICD-10-CM | POA: Diagnosis not present

## 2023-06-26 LAB — CMP (CANCER CENTER ONLY)
ALT: 34 U/L (ref 0–44)
AST: 21 U/L (ref 15–41)
Albumin: 3.8 g/dL (ref 3.5–5.0)
Alkaline Phosphatase: 99 U/L (ref 38–126)
Anion gap: 5 (ref 5–15)
BUN: 16 mg/dL (ref 8–23)
CO2: 25 mmol/L (ref 22–32)
Calcium: 8.8 mg/dL — ABNORMAL LOW (ref 8.9–10.3)
Chloride: 109 mmol/L (ref 98–111)
Creatinine: 0.76 mg/dL (ref 0.61–1.24)
GFR, Estimated: >60 mL/min (ref >60)
Glucose, Bld: 103 mg/dL — ABNORMAL HIGH (ref 70–99)
Potassium: 4.1 mmol/L (ref 3.5–5.1)
Sodium: 139 mmol/L (ref 135–145)
Total Bilirubin: 0.4 mg/dL (ref 0.3–1.2)
Total Protein: 6.3 g/dL — ABNORMAL LOW (ref 6.5–8.1)

## 2023-06-26 LAB — CBC WITH DIFFERENTIAL (CANCER CENTER ONLY)
Abs Immature Granulocytes: 0.02 10*3/uL (ref 0.00–0.07)
Basophils Absolute: 0 10*3/uL (ref 0.0–0.1)
Basophils Relative: 1 %
Eosinophils Absolute: 0.1 10*3/uL (ref 0.0–0.5)
Eosinophils Relative: 1 %
HCT: 41.5 % (ref 39.0–52.0)
Hemoglobin: 14.5 g/dL (ref 13.0–17.0)
Immature Granulocytes: 0 %
Lymphocytes Relative: 34 %
Lymphs Abs: 2 10*3/uL (ref 0.7–4.0)
MCH: 30.7 pg (ref 26.0–34.0)
MCHC: 34.9 g/dL (ref 30.0–36.0)
MCV: 87.9 fL (ref 80.0–100.0)
Monocytes Absolute: 0.8 10*3/uL (ref 0.1–1.0)
Monocytes Relative: 14 %
Neutro Abs: 3 10*3/uL (ref 1.7–7.7)
Neutrophils Relative %: 50 %
Platelet Count: 192 10*3/uL (ref 150–400)
RBC: 4.72 MIL/uL (ref 4.22–5.81)
RDW: 15 % (ref 11.5–15.5)
WBC Count: 6 10*3/uL (ref 4.0–10.5)
nRBC: 0 % (ref 0.0–0.2)

## 2023-06-26 MED ORDER — OXYCODONE HCL 5 MG PO TABS: 5.0000 mg | ORAL_TABLET | Freq: Four times a day (QID) | ORAL | 0 refills | Status: DC | PRN

## 2023-06-26 MED ORDER — SODIUM CHLORIDE 0.9 % IV SOLN
10.0000 mg | Freq: Once | INTRAVENOUS | Status: AC
  Administered 2023-06-26: 10 mg via INTRAVENOUS
  Filled 2023-06-26: qty 10

## 2023-06-26 MED ORDER — ATROPINE SULFATE 1 MG/ML IV SOLN
0.5000 mg | Freq: Once | INTRAVENOUS | Status: AC | PRN
  Administered 2023-06-26: 0.5 mg via INTRAVENOUS
  Filled 2023-06-26: qty 1

## 2023-06-26 MED ORDER — SODIUM CHLORIDE 0.9 % IV SOLN
2150.0000 mg/m2 | INTRAVENOUS | Status: DC
  Administered 2023-06-26: 4000 mg via INTRAVENOUS
  Filled 2023-06-26: qty 80

## 2023-06-26 MED ORDER — OXALIPLATIN CHEMO INJECTION 100 MG/20ML
55.0000 mg/m2 | Freq: Once | INTRAVENOUS | Status: AC
  Administered 2023-06-26: 100 mg via INTRAVENOUS
  Filled 2023-06-26: qty 20

## 2023-06-26 MED ORDER — OXYCODONE-ACETAMINOPHEN 5-325 MG PO TABS: 1.0000 | ORAL_TABLET | Freq: Three times a day (TID) | ORAL | 0 refills | Status: DC | PRN

## 2023-06-26 MED ORDER — SODIUM CHLORIDE 0.9 % IV SOLN
100.0000 mg/m2 | Freq: Once | INTRAVENOUS | Status: AC
  Administered 2023-06-26: 200 mg via INTRAVENOUS
  Filled 2023-06-26: qty 10

## 2023-06-26 MED ORDER — PALONOSETRON HCL INJECTION 0.25 MG/5ML
0.2500 mg | Freq: Once | INTRAVENOUS | Status: AC
  Administered 2023-06-26: 0.25 mg via INTRAVENOUS
  Filled 2023-06-26: qty 5

## 2023-06-26 MED ORDER — SODIUM CHLORIDE 0.9% FLUSH
10.0000 mL | Freq: Once | INTRAVENOUS | Status: AC
  Administered 2023-06-26: 10 mL

## 2023-06-26 MED ORDER — DEXTROSE 5 % IV SOLN: Freq: Once | INTRAVENOUS | Status: AC

## 2023-06-26 MED ORDER — ONDANSETRON HCL 8 MG PO TABS: 8.0000 mg | ORAL_TABLET | Freq: Three times a day (TID) | ORAL | 1 refills | Status: DC | PRN

## 2023-06-26 MED ORDER — PROCHLORPERAZINE MALEATE 10 MG PO TABS: 10.0000 mg | ORAL_TABLET | Freq: Four times a day (QID) | ORAL | 1 refills | Status: DC | PRN

## 2023-06-26 MED ORDER — SODIUM CHLORIDE 0.9 % IV SOLN
400.0000 mg/m2 | Freq: Once | INTRAVENOUS | Status: AC
  Administered 2023-06-26: 744 mg via INTRAVENOUS
  Filled 2023-06-26: qty 25

## 2023-06-26 MED ORDER — SODIUM CHLORIDE 0.9 % IV SOLN
150.0000 mg | Freq: Once | INTRAVENOUS | Status: AC
  Administered 2023-06-26: 150 mg via INTRAVENOUS
  Filled 2023-06-26: qty 150

## 2023-06-26 NOTE — Progress Notes (Signed)
Nutrition Follow-up:  Pt with stage IIA pancreatic cancer. He is receiving neoadjuvant modified Folfirinox (start 8/22). Pt is under the care of Dr. Mosetta Putt.   Met with pt and wife in infusion. He is completing final neoadjuvant chemo today. Pt reports having a rough time after last treatment. Patient reports extreme fatigue, weakness, taste changes, poor appetite. Says it took ~9 days before symptoms subsided. Noted 10% dose reduction today. Patient reports appetite has picked up and eating well the last few days. He is drinking one Ensure.    Medications: roxicodone, percocet, compazine, zofran (10/2)  Labs: reviewed   Anthropometrics: Wt 140 lb 3.2 oz today   9/17 - 142 lb 12.8 oz    NUTRITION DIAGNOSIS: Unintended wt loss continues    INTERVENTION:  Encouraged high calorie high protein foods for wt maintenance  Continue drinking Ensure Plus/equivalent, recommend pt increase these 3-4/day on days appetite is poor Encouraged activity as able, increase LBM prior to surgery - pt will consider returning to [facility]    MONITORING, EVALUATION, GOAL: wt trends, intake   NEXT VISIT: To be scheduled in collaboration with post-op [facility] appointments. Pt has contact information and encouraged to contact with nutrition questions/concerns

## 2023-06-26 NOTE — Progress Notes (Signed)
[facility]   [phone] [phone]   Clinic Follow up Note   Patient Care Team: [name], MD as PCP - General [name], [name], MD as PCP - Cardiology (Cardiology) [name], MD as Consulting Physician (Hematology)  Date of Service:  06/26/2023  CHIEF COMPLAINT: f/u of pancreatic cancer  CURRENT THERAPY:  Neoadjuvant chemotherapy FOLFIRINOX  Oncology History   Pancreatic cancer (HCC) -cT3N0M0, stage IIA --Patient presented with abdominal pain and weight loss, CT scan revealed a 4.4 x 3.5 cm ill-defined mass in the pancreatic tail, which 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  -baseline CA19.9 872 -EUS pancreatic mass biopsy confirmed adenocarcinoma. -He has seen surgeon [name] -We reviewed his case in tumor board, and recommended neoadjuvant chemotherapy.  Will repeat CT with pancreatic cancer protocol after neoadjuvant chemo. -Staging PET scan from 8/26 reviewed, no metastatic disease, there is some mild uptake in the periaortic lymph node, will monitor in the future scan.  I also discussed incidental finding of thyroid nodule, I ordered thyroid ultrasound for evaluation. -He started first cycle of chemotherapy FOLFIRINOX on May 16, 2023. He tolerated first cycle moderately well with fatigue, cold sensitivity and abdominal pain etc, discussed management of side effect. -Due to significant weight loss and fatigue after first cycle chemo, I reduced his oxaliplatin and irinotecan dose from cycle 2. -plan to repeat CT scan after C5 or C6 chemo     Assessment and Plan    Pancreatic Cancer Undergoing chemotherapy with significant weight loss and fatigue. Altered taste sensation making it difficult to eat. Pain managed with morphine and oxycodone. -Reduce chemotherapy dose by 10% to improve tolerability. -Encourage increased caloric intake to prevent further weight loss. -Refill oxycodone  prescription. -Continue morphine as needed for pain control. -Consider reducing morphine to evening dose if daytime pain is manageable. -Check tumor markers at next lab draw.  Cancer related pain -He is on MS Contin 15 mg twice daily, and oxycodone as needed, his pain has overall improved, he is taking much less oxycodone now -He was on chronic Percocet for his back pain, he still requests Percocet as needed in addition to oxycodone Likely due to weight loss and muscle weakness. Using heating pad and cushions for relief. -Continue current management with heating pad and cushions.  Plan -Lab reviewed, adequate for treatment, will proceed to cycle 4 chemo today with further dose reduction due to poor tolerance -f/u in 2 weeks before C5, will order restaging CT on next visit     SUMMARY OF ONCOLOGIC HISTORY: Oncology History Overview Note   Cancer Staging  Pancreatic cancer [facility]) Staging form: Exocrine Pancreas, AJCC 8th Edition - Clinical stage from 05/01/2023: Stage III (cT3, cN2, cM0) - Signed by [name], MD on 05/03/2023     Pancreatic cancer (HCC)  05/01/2023 Cancer Staging   Staging form: Exocrine Pancreas, AJCC 8th Edition - Clinical stage from 05/01/2023: Stage III (cT3, cN2, cM0) - Signed by [name], MD on 05/03/2023   05/02/2023 Initial Diagnosis   Pancreatic cancer (HCC)   05/16/2023 -  Chemotherapy   Patient is on Treatment Plan : PANCREAS Modified FOLFIRINOX q14d x 4 cycles        Discussed the use of AI scribe software for clinical note transcription with the patient, who gave verbal consent to proceed.  History of Present Illness   The patient is a 72 year old individual with a history of pancreatic cancer, currently undergoing chemotherapy. He reports  significant weight loss, dropping from 145 pounds to 131 pounds within a short period. Despite efforts to increase food intake, the patient has only managed to regain some weight, currently weighing around 138  pounds.  The patient describes the first week after chemotherapy as particularly challenging, with extreme fatigue and a loss of appetite due to altered taste sensation. He reports sleeping for almost 18 hours following the first day of chemotherapy and struggling with food intake for the next two to three days due to a persistent unpleasant taste in his mouth.  The patient also reports persistent stomach pain, which has decreased in intensity but remains present. The pain is managed with morphine and oxycodone, with the patient noting a reduction in the required dose of oxycodone over time. However, the patient has experienced two incidents of nocturnal incontinence, which he attributes to the chemotherapy.  The patient also reports back pain, which he attributes to significant weight loss and weakened muscles. He uses a heating pad for relief and has considered the use of a muscle relaxer.  [name] wife confirms the patient's reports and adds that the patient did not experience cold sensitivity or mouth sores, which are common side effects of chemotherapy.         All other systems were reviewed with the patient and are negative.  MEDICAL HISTORY:  Past Medical History:  Diagnosis Date   CAD (coronary artery disease)    Cerebrovascular disease    Diabetes mellitus without complication (HCC)    HLD (hyperlipidemia)    HTN (hypertension)    IBS (irritable bowel syndrome)    Patient states did not have   Lumbar stenosis    MI, old    Persistent atrial fibrillation (HCC)    PVD (peripheral vascular disease) (HCC)    Stroke (cerebrum) (HCC)    Tobacco abuse     SURGICAL HISTORY: Past Surgical History:  Procedure Laterality Date   ABDOMINAL AORTOGRAM W/LOWER EXTREMITY N/A 01/25/2017   Procedure: Abdominal Aortogram w/Lower Extremity;  Surgeon: Sherren Kerns, MD;  Location: [facility] INVASIVE CV LAB;  Service: Cardiovascular;  Laterality: N/A;   ANTERIOR CRUCIATE LIGAMENT REPAIR      CARDIAC CATHETERIZATION     CONE   CARDIAC CATHETERIZATION N/A 07/27/2016   Procedure: Left Heart Cath and Coronary Angiography;  Surgeon: Tonny Bollman, MD;  Location: [facility] INVASIVE CV LAB;  Service: Cardiovascular;  Laterality: N/A;   CARDIOVERSION N/A 06/29/2016   Procedure: CARDIOVERSION;  Surgeon: Pricilla Riffle, MD;  Location: [facility] ENDOSCOPY;  Service: Cardiovascular;  Laterality: N/A;   CARDIOVERSION N/A 03/05/2018   Procedure: CARDIOVERSION;  Surgeon: Laurey Morale, MD;  Location: [facility] ENDOSCOPY;  Service: Cardiovascular;  Laterality: N/A;   CHOLECYSTECTOMY     ENDARTERECTOMY Right 12/06/2017   Procedure: ENDARTERECTOMY CAROTID RIGHT;  Surgeon: Nada Libman, MD;  Location: [facility] OR;  Service: Vascular;  Laterality: Right;   ESOPHAGOGASTRODUODENOSCOPY (EGD) WITH PROPOFOL N/A 05/01/2023   Procedure: ESOPHAGOGASTRODUODENOSCOPY (EGD) WITH PROPOFOL;  Surgeon: Willis Modena, MD;  Location: [facility] ENDOSCOPY;  Service: Gastroenterology;  Laterality: N/A;   FINE NEEDLE ASPIRATION N/A 05/01/2023   Procedure: FINE NEEDLE ASPIRATION (FNA) LINEAR;  Surgeon: Willis Modena, MD;  Location: [facility] ENDOSCOPY;  Service: Gastroenterology;  Laterality: N/A;   KNEE SURGERY Bilateral 1982, 1988, 1997, 2000   x 4 times.   LOWER EXTREMITY ANGIOGRAPHY N/A 07/03/2019   Procedure: LOWER EXTREMITY ANGIOGRAPHY;  Surgeon: Sherren Kerns, MD;  Location: [facility] INVASIVE CV LAB;  Service: Cardiovascular;  Laterality: N/A;  LUMBAR LAMINECTOMY/DECOMPRESSION MICRODISCECTOMY Bilateral 09/09/2018   Procedure: Laminectomy and Foraminotomy bilateral Lumbar three-Lumbar four - Lumbar four-Lumbar five;  Surgeon: Julio Sicks, MD;  Location: [facility] OR;  Service: Neurosurgery;  Laterality: Bilateral;   PATCH ANGIOPLASTY Right 12/06/2017   Procedure: PATCH ANGIOPLASTY RIGHT CAROTID ARTERY;  Surgeon: Nada Libman, MD;  Location: [facility] OR;  Service: Vascular;  Laterality: Right;   PERIPHERAL VASCULAR INTERVENTION Right 01/25/2017    Procedure: Peripheral Vascular Intervention;  Surgeon: Sherren Kerns, MD;  Location: [facility] INVASIVE CV LAB;  Service: Cardiovascular;  Laterality: Right;   PERIPHERAL VASCULAR INTERVENTION Right 07/03/2019   Procedure: PERIPHERAL VASCULAR INTERVENTION;  Surgeon: Sherren Kerns, MD;  Location: [facility] INVASIVE CV LAB;  Service: Cardiovascular;  Laterality: Right;   PORTACATH PLACEMENT Left 05/14/2023   Procedure: INSERTION PORT-A-CATH;  Surgeon: Almond Lint, MD;  Location: [facility] OR;  Service: General;  Laterality: Left;   TONSILLECTOMY     UPPER ESOPHAGEAL ENDOSCOPIC ULTRASOUND (EUS) Bilateral 05/01/2023   Procedure: UPPER ESOPHAGEAL ENDOSCOPIC ULTRASOUND (EUS);  Surgeon: Willis Modena, MD;  Location: [facility] ENDOSCOPY;  Service: Gastroenterology;  Laterality: Bilateral;    I have reviewed the social history and family history with the patient and they are unchanged from previous note.  ALLERGIES:  is allergic to augmentin [amoxicillin-pot clavulanate].  MEDICATIONS:  Current Outpatient Medications  Medication Sig Dispense Refill   amLODipine (NORVASC) 5 MG tablet Take 5 mg by mouth at bedtime.     aspirin EC 81 MG tablet Take 81 mg by mouth daily.     diazepam (VALIUM) 2 MG tablet Take 2 mg by mouth 2 (two) times daily as needed for anxiety.     docusate sodium (COLACE) 100 MG capsule Take 200 mg by mouth daily.     ELIQUIS 5 MG TABS tablet TAKE 1 TABLET BY MOUTH TWICE DAILY. PLEASE KEEP UPCOMING APPOINTMENT 60 tablet 1   esomeprazole (NEXIUM) 40 MG capsule Take 40 mg by mouth daily as needed (heartburn/hiccups).     fenofibrate (TRICOR) 48 MG tablet Take 48 mg by mouth daily.     gabapentin (NEURONTIN) 300 MG capsule Take 900 mg by mouth 2 (two) times daily.     glipiZIDE (GLUCOTROL XL) 5 MG 24 hr tablet Take 5 mg by mouth daily after supper.     hyoscyamine (LEVSIN SL) 0.125 MG SL tablet Place 0.125 mg under the tongue every 4 (four) hours as needed for cramping.     Insulin Glargine (BASAGLAR  KWIKPEN) 100 UNIT/ML Inject 20 Units into the skin daily.     JARDIANCE 25 MG TABS tablet Take 25 mg by mouth daily.     lidocaine-prilocaine (EMLA) cream Apply to affected area once 30 g 3   metFORMIN (GLUCOPHAGE-XR) 500 MG 24 hr tablet Take 1,500 mg by mouth daily after supper.     morphine (MS CONTIN) 15 MG 12 hr tablet Take 1 tablet (15 mg total) by mouth every 12 (twelve) hours. 60 tablet 0   ondansetron (ZOFRAN) 4 MG tablet Take 4 mg by mouth every 8 (eight) hours as needed for nausea or vomiting.     ondansetron (ZOFRAN) 8 MG tablet Take 1 tablet (8 mg total) by mouth every 8 (eight) hours as needed for nausea or vomiting. Start on the third day after cisplatin 30 tablet 1   oxyCODONE (OXY IR/ROXICODONE) 5 MG immediate release tablet Take 1-2 tablets (5-10 mg total) by mouth every 6 (six) hours as needed for severe pain. 30 tablet 0   oxyCODONE-acetaminophen (  PERCOCET/ROXICET) 5-325 MG tablet Take 1 tablet by mouth every 8 (eight) hours as needed for severe pain. 20 tablet 0   polyethylene glycol (MIRALAX / GLYCOLAX) 17 g packet Take 17 g by mouth daily as needed for moderate constipation.     prochlorperazine (COMPAZINE) 10 MG tablet Take 1 tablet (10 mg total) by mouth every 6 (six) hours as needed for nausea or vomiting (Nausea or vomiting). 30 tablet 1   rosuvastatin (CRESTOR) 40 MG tablet Take 40 mg by mouth daily.     sildenafil (VIAGRA) 100 MG tablet Take 100 mg by mouth as needed for erectile dysfunction.     spironolactone (ALDACTONE) 25 MG tablet Take 25 mg by mouth daily.     valsartan (DIOVAN) 320 MG tablet Take 320 mg by mouth daily.     No current facility-administered medications for this visit.    PHYSICAL EXAMINATION: ECOG PERFORMANCE STATUS: 1 - Symptomatic but completely ambulatory  Vitals:   06/26/23 0911  BP: 113/71  Pulse: 67  Resp: 17  Temp: 98 F (36.7 C)  SpO2: 100%   Wt Readings from Last 3 Encounters:  06/26/23 140 lb 3.2 oz (63.6 kg)  06/11/23 142  lb 12.8 oz (64.8 kg)  05/29/23 145 lb (65.8 kg)     GENERAL:alert, no distress and comfortable SKIN: skin color, texture, turgor are normal, no rashes or significant lesions EYES: normal, Conjunctiva are pink and non-injected, sclera clear NECK: supple, thyroid normal size, non-tender, without nodularity LYMPH:  no palpable lymphadenopathy in the cervical, axillary  LUNGS: clear to auscultation and percussion with normal breathing effort HEART: regular rate & rhythm and no murmurs and no lower extremity edema ABDOMEN:abdomen soft, non-tender and normal bowel sounds Musculoskeletal:no cyanosis of digits and no clubbing  NEURO: alert & oriented x 3 with fluent speech, no focal motor/sensory deficits   LABORATORY DATA:  I have reviewed the data as listed    Latest Ref Rng & Units 06/26/2023    8:51 AM 06/11/2023    9:35 AM 05/29/2023    8:56 AM  CBC  WBC 4.0 - 10.5 K/uL 6.0  6.2  7.2   Hemoglobin 13.0 - 17.0 g/dL 16.1  09.6  04.5   Hematocrit 39.0 - 52.0 % 41.5  40.5  42.6   Platelets 150 - 400 K/uL 192  189  259         Latest Ref Rng & Units 06/26/2023    8:51 AM 06/11/2023    9:35 AM 05/29/2023    8:56 AM  CMP  Glucose 70 - 99 mg/dL 409  811  914   BUN 8 - 23 mg/dL 16  19  18    Creatinine 0.61 - 1.24 mg/dL 7.82  9.56  2.13   Sodium 135 - 145 mmol/L 139  139  139   Potassium 3.5 - 5.1 mmol/L 4.1  4.0  3.6   Chloride 98 - 111 mmol/L 109  108  105   CO2 22 - 32 mmol/L 25  26  26    Calcium 8.9 - 10.3 mg/dL 8.8  8.8  9.0   Total Protein 6.5 - 8.1 g/dL 6.3  6.3  6.5   Total Bilirubin 0.3 - 1.2 mg/dL 0.4  0.4  0.4   Alkaline Phos 38 - 126 U/L 99  80  92   AST 15 - 41 U/L 21  12  37   ALT 0 - 44 U/L 34  22  85  RADIOGRAPHIC STUDIES: I have personally reviewed the radiological images as listed and agreed with the findings in the report. No results found.    No orders of the defined types were placed in this encounter.  All questions were answered. The patient knows to  call the clinic with any problems, questions or concerns. No barriers to learning was detected. The total time spent in the appointment was 30 minutes.     [name], MD 06/26/2023

## 2023-06-26 NOTE — Progress Notes (Signed)
Ok to proceed with irinotecan 200mg .  Rounded to vial

## 2023-06-27 LAB — CANCER ANTIGEN 19-9: CA 19-9: 743 U/mL — ABNORMAL HIGH (ref 0–35)

## 2023-06-28 ENCOUNTER — Inpatient Hospital Stay: Payer: Medicare Other

## 2023-06-28 VITALS — BP 117/68 | HR 65 | Temp 98.4°F | Resp 16

## 2023-06-28 DIAGNOSIS — Z5111 Encounter for antineoplastic chemotherapy: Secondary | ICD-10-CM | POA: Diagnosis not present

## 2023-06-28 DIAGNOSIS — C252 Malignant neoplasm of tail of pancreas: Secondary | ICD-10-CM

## 2023-06-28 MED ORDER — SODIUM CHLORIDE 0.9% FLUSH
10.0000 mL | INTRAVENOUS | Status: DC | PRN
  Administered 2023-06-28: 10 mL

## 2023-06-28 MED ORDER — HEPARIN SOD (PORK) LOCK FLUSH 100 UNIT/ML IV SOLN
500.0000 [IU] | Freq: Once | INTRAVENOUS | Status: AC | PRN
  Administered 2023-06-28: 500 [IU]

## 2023-07-01 ENCOUNTER — Telehealth: Payer: Self-pay | Admitting: Hematology

## 2023-07-01 ENCOUNTER — Telehealth: Payer: Self-pay

## 2023-07-01 ENCOUNTER — Other Ambulatory Visit: Payer: Self-pay | Admitting: Hematology

## 2023-07-01 DIAGNOSIS — C252 Malignant neoplasm of tail of pancreas: Secondary | ICD-10-CM

## 2023-07-01 NOTE — Telephone Encounter (Signed)
Returned pt's voicemail message regarding chemo appts.  LVM stating that Dr. Mosetta Putt has ordered a CT Scan which he has scheduled on 07/04/2023 which will determine the next steps in pt's tx plan.  Responded to pt's concern regarding weight loss.  Stated that Dr. Mosetta Putt can do a nutritional consult to our Nutritionist Team if the pt has not already met with them regarding his dietary/weight loss concerns.  Also, pt stated he's having difficulty eating due to mouth pain.  Stated if pt would please contact Dr. Latanya Maudlin office so pt's mouth pain could be further assessed.  Awaiting return call from pt.

## 2023-07-01 NOTE — Assessment & Plan Note (Signed)
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  -baseline CA19.9 872 -EUS pancreatic mass biopsy confirmed adenocarcinoma. -Scott [name] has seen surgeon [name] -We reviewed his case in tumor board, and recommended neoadjuvant chemotherapy.  Will repeat CT with pancreatic cancer protocol after neoadjuvant chemo. -Staging PET scan from 8/26 reviewed, no metastatic disease, there is some mild uptake in the periaortic lymph node, will monitor in the future scan.  I also discussed incidental finding of thyroid nodule, I ordered thyroid ultrasound for evaluation. -Scott [name] started first cycle of chemotherapy FOLFIRINOX on May 16, 2023. Scott [name] tolerated first cycle moderately well with fatigue, cold sensitivity and abdominal pain etc, discussed management of side effect. -Due to significant weight loss and fatigue after first cycle chemo, I reduced his oxaliplatin and irinotecan dose from cycle 2. -Scott [name] is not tolerating chemo well, his CA19.9 has not changed much since Scott [name] started chemo. I will change his treatment to gemcitabine and abraxane.

## 2023-07-02 ENCOUNTER — Inpatient Hospital Stay: Payer: Medicare Other | Admitting: Hematology

## 2023-07-02 ENCOUNTER — Encounter: Payer: Self-pay | Admitting: Hematology

## 2023-07-02 DIAGNOSIS — C252 Malignant neoplasm of tail of pancreas: Secondary | ICD-10-CM

## 2023-07-02 NOTE — Progress Notes (Signed)
[facility]   [phone] [phone]   Clinic Follow up Note   Patient Care Team: [name], MD as PCP - General [name], [name], MD as PCP - Cardiology (Cardiology) [name], MD as Consulting Physician (Hematology) 07/02/2023  I connected with Scott [name] on 07/02/23 at 10:00 AM EDT by telephone and verified that I am speaking with the correct person using two identifiers.   I discussed the limitations, risks, security and privacy concerns of performing an evaluation and management service by telephone and the availability of in person appointments. I also discussed with the patient that there may be a patient responsible charge related to this service. The patient expressed understanding and agreed to proceed.   Patient's location:  Home  Provider's location:  Office    CHIEF COMPLAINT: Follow-up of pancreatic cancer   CURRENT THERAPY: Neoadjuvant chemotherapy FOLFIRINOX  Oncology history Pancreatic cancer (HCC) -cT3N0M0, stage IIA --Patient presented with abdominal pain and weight loss, CT scan revealed a 4.4 x 3.5 cm ill-defined mass in the pancreatic tail, which 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  -baseline CA19.9 872 -EUS pancreatic mass biopsy confirmed adenocarcinoma. -He has seen surgeon [name] -We reviewed his case in tumor board, and recommended neoadjuvant chemotherapy.  Will repeat CT with pancreatic cancer protocol after neoadjuvant chemo. -Staging PET scan from 8/26 reviewed, no metastatic disease, there is some mild uptake in the periaortic lymph node, will monitor in the future scan.  I also discussed incidental finding of thyroid nodule, I ordered thyroid ultrasound for evaluation. -He started first cycle of chemotherapy FOLFIRINOX on May 16, 2023. He tolerated first cycle moderately well with fatigue, cold sensitivity and abdominal pain etc, discussed management of side  effect. -Due to significant weight loss and fatigue after first cycle chemo, I reduced his oxaliplatin and irinotecan dose from cycle 2. -He is not tolerating chemo well, his CA19.9 has not changed much since he started chemo. I will change his treatment to gemcitabine and abraxane.   Assessment and Plan    Pancreatic Cancer Patient is experiencing significant side effects from current chemotherapy regimen, including weight loss, muscle mass loss, and difficulty eating due to altered taste. Tumor marker CA 19-9 has shown only slight decrease from 872 to 743 after several cycles of chemotherapy. CT scan scheduled for further assessment. -Change chemotherapy regimen to combination of gemcitabine and Abraxane, administered once a week for two weeks with one week off. This regimen is expected to be easier to tolerate and does not require a home pump. -Consult with surgeon post-CT scan to discuss possibility of surgery. If surgery is feasible, post-operative chemotherapy will be required, likely with a different, more tolerable regimen. -Next chemotherapy treatment scheduled for 07/09/2023 with new regimen, pending surgeon's input.  General Health Patient is struggling with maintaining adequate nutrition due to chemotherapy side effects. -Encourage continued efforts to consume nutrient-dense foods as tolerated.    Plan -Due to his overall poor tolerance to FOLFIRINOX, and no significant improvement of his tumor marker, potentiates chemo regimen to gemcitabine and Abraxane, starting next week. -He is scheduled for CT scan to evaluate his response to neoadjuvant chemo, will inform his surgeon [name], patient wants to know if he can have surgery without more chemo.     SUMMARY OF ONCOLOGIC HISTORY: Oncology History Overview Note   Cancer Staging  Pancreatic cancer [facility]) Staging form: Exocrine Pancreas, AJCC 8th Edition - Clinical stage from 05/01/2023: Stage  III (cT3, cN2, cM0) - Signed by  [name], MD on 05/03/2023     Pancreatic cancer [facility])  05/01/2023 Cancer Staging   Staging form: Exocrine Pancreas, AJCC 8th Edition - Clinical stage from 05/01/2023: Stage III (cT3, cN2, cM0) - Signed by [name], MD on 05/03/2023   05/02/2023 Initial Diagnosis   Pancreatic cancer (HCC)   05/16/2023 - 06/28/2023 Chemotherapy   Patient is on Treatment Plan : PANCREAS Modified FOLFIRINOX q14d x 4 cycles     07/09/2023 -  Chemotherapy   Patient is on Treatment Plan : PANCREATIC Abraxane D1,8,15 + Gemcitabine D1,8,15 q28d       Discussed the use of AI scribe software for clinical note transcription with the patient, who gave verbal consent to proceed.  History of Present Illness   The patient, with a history of pancreatic cancer, reports severe weakness and difficulty eating following his last cycle of chemotherapy. He describes a significant loss of weight and muscle mass, which he believes has exacerbated his weakness. He also reports an altered taste sensation, describing it as if his mouth has been numbed or glazed, which has made food taste abnormal and has further hindered his ability to eat. Despite these challenges, he has been trying to maintain his nutrition by eating small meals when he can.  The patient also expresses distress over his current treatment plan, particularly the use of a chemotherapy pump, which he associates with feeling unwell. He was initially under the impression that he would undergo four cycles of chemotherapy followed by surgery, and was distressed to see additional chemotherapy sessions scheduled. However, he was relieved to learn that these were placeholders and that changes to his treatment plan were being considered.         REVIEW OF SYSTEMS:   Constitutional: Denies fevers, chills or abnormal weight loss Eyes: Denies blurriness of vision Ears, nose, mouth, throat, and face: Denies mucositis or sore throat Respiratory: Denies cough, dyspnea or  wheezes Cardiovascular: Denies palpitation, chest discomfort or lower extremity swelling Gastrointestinal:  Denies nausea, heartburn or change in bowel habits Skin: Denies abnormal skin rashes Lymphatics: Denies new lymphadenopathy or easy bruising Neurological:Denies numbness, tingling or new weaknesses Behavioral/Psych: Mood is stable, no new changes  All other systems were reviewed with the patient and are negative.  MEDICAL HISTORY:  Past Medical History:  Diagnosis Date   CAD (coronary artery disease)    Cerebrovascular disease    Diabetes mellitus without complication (HCC)    HLD (hyperlipidemia)    HTN (hypertension)    IBS (irritable bowel syndrome)    Patient states did not have   Lumbar stenosis    MI, old    Persistent atrial fibrillation (HCC)    PVD (peripheral vascular disease) (HCC)    Stroke (cerebrum) (HCC)    Tobacco abuse     SURGICAL HISTORY: Past Surgical History:  Procedure Laterality Date   ABDOMINAL AORTOGRAM W/LOWER EXTREMITY N/A 01/25/2017   Procedure: Abdominal Aortogram w/Lower Extremity;  Surgeon: Sherren Kerns, MD;  Location: [facility] INVASIVE CV LAB;  Service: Cardiovascular;  Laterality: N/A;   ANTERIOR CRUCIATE LIGAMENT REPAIR     CARDIAC CATHETERIZATION     CONE   CARDIAC CATHETERIZATION N/A 07/27/2016   Procedure: Left Heart Cath and Coronary Angiography;  Surgeon: Tonny Bollman, MD;  Location: [facility] INVASIVE CV LAB;  Service: Cardiovascular;  Laterality: N/A;   CARDIOVERSION N/A 06/29/2016   Procedure: CARDIOVERSION;  Surgeon: Pricilla Riffle, MD;  Location: [facility] ENDOSCOPY;  Service:  Cardiovascular;  Laterality: N/A;   CARDIOVERSION N/A 03/05/2018   Procedure: CARDIOVERSION;  Surgeon: Laurey Morale, MD;  Location: [facility] ENDOSCOPY;  Service: Cardiovascular;  Laterality: N/A;   CHOLECYSTECTOMY     ENDARTERECTOMY Right 12/06/2017   Procedure: ENDARTERECTOMY CAROTID RIGHT;  Surgeon: Nada Libman, MD;  Location: [facility] OR;  Service: Vascular;   Laterality: Right;   ESOPHAGOGASTRODUODENOSCOPY (EGD) WITH PROPOFOL N/A 05/01/2023   Procedure: ESOPHAGOGASTRODUODENOSCOPY (EGD) WITH PROPOFOL;  Surgeon: Willis Modena, MD;  Location: [facility] ENDOSCOPY;  Service: Gastroenterology;  Laterality: N/A;   FINE NEEDLE ASPIRATION N/A 05/01/2023   Procedure: FINE NEEDLE ASPIRATION (FNA) LINEAR;  Surgeon: Willis Modena, MD;  Location: [facility] ENDOSCOPY;  Service: Gastroenterology;  Laterality: N/A;   KNEE SURGERY Bilateral 1982, 1988, 1997, 2000   x 4 times.   LOWER EXTREMITY ANGIOGRAPHY N/A 07/03/2019   Procedure: LOWER EXTREMITY ANGIOGRAPHY;  Surgeon: Sherren Kerns, MD;  Location: [facility] INVASIVE CV LAB;  Service: Cardiovascular;  Laterality: N/A;   LUMBAR LAMINECTOMY/DECOMPRESSION MICRODISCECTOMY Bilateral 09/09/2018   Procedure: Laminectomy and Foraminotomy bilateral Lumbar three-Lumbar four - Lumbar four-Lumbar five;  Surgeon: Julio Sicks, MD;  Location: [facility] OR;  Service: Neurosurgery;  Laterality: Bilateral;   PATCH ANGIOPLASTY Right 12/06/2017   Procedure: PATCH ANGIOPLASTY RIGHT CAROTID ARTERY;  Surgeon: Nada Libman, MD;  Location: [facility] OR;  Service: Vascular;  Laterality: Right;   PERIPHERAL VASCULAR INTERVENTION Right 01/25/2017   Procedure: Peripheral Vascular Intervention;  Surgeon: Sherren Kerns, MD;  Location: [facility] INVASIVE CV LAB;  Service: Cardiovascular;  Laterality: Right;   PERIPHERAL VASCULAR INTERVENTION Right 07/03/2019   Procedure: PERIPHERAL VASCULAR INTERVENTION;  Surgeon: Sherren Kerns, MD;  Location: [facility] INVASIVE CV LAB;  Service: Cardiovascular;  Laterality: Right;   PORTACATH PLACEMENT Left 05/14/2023   Procedure: INSERTION PORT-A-CATH;  Surgeon: Almond Lint, MD;  Location: [facility] OR;  Service: General;  Laterality: Left;   TONSILLECTOMY     UPPER ESOPHAGEAL ENDOSCOPIC ULTRASOUND (EUS) Bilateral 05/01/2023   Procedure: UPPER ESOPHAGEAL ENDOSCOPIC ULTRASOUND (EUS);  Surgeon: Willis Modena, MD;  Location: [facility] ENDOSCOPY;   Service: Gastroenterology;  Laterality: Bilateral;    I have reviewed the social history and family history with the patient and they are unchanged from previous note.  ALLERGIES:  is allergic to augmentin [amoxicillin-pot clavulanate].  MEDICATIONS:  Current Outpatient Medications  Medication Sig Dispense Refill   amLODipine (NORVASC) 5 MG tablet Take 5 mg by mouth at bedtime.     aspirin EC 81 MG tablet Take 81 mg by mouth daily.     diazepam (VALIUM) 2 MG tablet Take 2 mg by mouth 2 (two) times daily as needed for anxiety.     docusate sodium (COLACE) 100 MG capsule Take 200 mg by mouth daily.     ELIQUIS 5 MG TABS tablet TAKE 1 TABLET BY MOUTH TWICE DAILY. PLEASE KEEP UPCOMING APPOINTMENT 60 tablet 1   esomeprazole (NEXIUM) 40 MG capsule Take 40 mg by mouth daily as needed (heartburn/hiccups).     fenofibrate (TRICOR) 48 MG tablet Take 48 mg by mouth daily.     gabapentin (NEURONTIN) 300 MG capsule Take 900 mg by mouth 2 (two) times daily.     glipiZIDE (GLUCOTROL XL) 5 MG 24 hr tablet Take 5 mg by mouth daily after supper.     hyoscyamine (LEVSIN SL) 0.125 MG SL tablet Place 0.125 mg under the tongue every 4 (four) hours as needed for cramping.     Insulin Glargine (BASAGLAR KWIKPEN) 100 UNIT/ML Inject 20 Units into the  skin daily.     JARDIANCE 25 MG TABS tablet Take 25 mg by mouth daily.     metFORMIN (GLUCOPHAGE-XR) 500 MG 24 hr tablet Take 1,500 mg by mouth daily after supper.     morphine (MS CONTIN) 15 MG 12 hr tablet Take 1 tablet (15 mg total) by mouth every 12 (twelve) hours. 60 tablet 0   ondansetron (ZOFRAN) 4 MG tablet Take 4 mg by mouth every 8 (eight) hours as needed for nausea or vomiting.     oxyCODONE (OXY IR/ROXICODONE) 5 MG immediate release tablet Take 1-2 tablets (5-10 mg total) by mouth every 6 (six) hours as needed for severe pain. 30 tablet 0   oxyCODONE-acetaminophen (PERCOCET/ROXICET) 5-325 MG tablet Take 1 tablet by mouth every 8 (eight) hours as needed for  severe pain. 20 tablet 0   polyethylene glycol (MIRALAX / GLYCOLAX) 17 g packet Take 17 g by mouth daily as needed for moderate constipation.     rosuvastatin (CRESTOR) 40 MG tablet Take 40 mg by mouth daily.     sildenafil (VIAGRA) 100 MG tablet Take 100 mg by mouth as needed for erectile dysfunction.     spironolactone (ALDACTONE) 25 MG tablet Take 25 mg by mouth daily.     valsartan (DIOVAN) 320 MG tablet Take 320 mg by mouth daily.     No current facility-administered medications for this visit.    PHYSICAL EXAMINATION: Not performed   LABORATORY DATA:  I have reviewed the data as listed    Latest Ref Rng & Units 06/26/2023    8:51 AM 06/11/2023    9:35 AM 05/29/2023    8:56 AM  CBC  WBC 4.0 - 10.5 K/uL 6.0  6.2  7.2   Hemoglobin 13.0 - 17.0 g/dL 16.1  09.6  04.5   Hematocrit 39.0 - 52.0 % 41.5  40.5  42.6   Platelets 150 - 400 K/uL 192  189  259         Latest Ref Rng & Units 06/26/2023    8:51 AM 06/11/2023    9:35 AM 05/29/2023    8:56 AM  CMP  Glucose 70 - 99 mg/dL 409  811  914   BUN 8 - 23 mg/dL 16  19  18    Creatinine 0.61 - 1.24 mg/dL 7.82  9.56  2.13   Sodium 135 - 145 mmol/L 139  139  139   Potassium 3.5 - 5.1 mmol/L 4.1  4.0  3.6   Chloride 98 - 111 mmol/L 109  108  105   CO2 22 - 32 mmol/L 25  26  26    Calcium 8.9 - 10.3 mg/dL 8.8  8.8  9.0   Total Protein 6.5 - 8.1 g/dL 6.3  6.3  6.5   Total Bilirubin 0.3 - 1.2 mg/dL 0.4  0.4  0.4   Alkaline Phos 38 - 126 U/L 99  80  92   AST 15 - 41 U/L 21  12  37   ALT 0 - 44 U/L 34  22  85       RADIOGRAPHIC STUDIES: I have personally reviewed the radiological images as listed and agreed with the findings in the report. No results found.     I discussed the assessment and treatment plan with the patient. The patient was provided an opportunity to ask questions and all were answered. The patient agreed with the plan and demonstrated an understanding of the instructions.   The patient was advised to call back  or  seek an in-person evaluation if the symptoms worsen or if the condition fails to improve as anticipated.  I provided 22 minutes of non face-to-face telephone visit time during this encounter, and > 50% was spent counseling as documented under my assessment & plan.     [name], MD 07/02/23

## 2023-07-03 ENCOUNTER — Other Ambulatory Visit: Payer: Self-pay | Admitting: Cardiovascular Disease

## 2023-07-03 ENCOUNTER — Other Ambulatory Visit: Payer: Self-pay

## 2023-07-03 DIAGNOSIS — I4891 Unspecified atrial fibrillation: Secondary | ICD-10-CM

## 2023-07-03 NOTE — Telephone Encounter (Signed)
Prescription refill request for Eliquis received. Indication: AF Last office visit: 03/12/23  P [name] MD Scr: 0.76 on 06/26/23  Epic Age: 72 Weight: 71.1kg  Based on above findings Eliquis 5mg  twice daily is the appropriate dose.  Refill approved.

## 2023-07-04 ENCOUNTER — Ambulatory Visit (HOSPITAL_COMMUNITY)
Admission: RE | Admit: 2023-07-04 | Discharge: 2023-07-04 | Disposition: A | Discharge status: Home / Self Care | Payer: Medicare Other | Source: Ambulatory Visit | Attending: Hematology | Admitting: Hematology

## 2023-07-04 DIAGNOSIS — C252 Malignant neoplasm of tail of pancreas: Secondary | ICD-10-CM | POA: Insufficient documentation

## 2023-07-04 MED ORDER — IOHEXOL 350 MG/ML SOLN
100.0000 mL | Freq: Once | INTRAVENOUS | Status: AC | PRN
  Administered 2023-07-04: 100 mL via INTRAVENOUS

## 2023-07-08 ENCOUNTER — Other Ambulatory Visit: Payer: Self-pay

## 2023-07-08 NOTE — Progress Notes (Signed)
Contacted [place] [facility] to expedite reading of CT Pancrease Abdomen w wo contrast. Cherl will have it expedited.

## 2023-07-08 NOTE — Assessment & Plan Note (Signed)
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  -baseline CA19.9 872 -EUS pancreatic mass biopsy confirmed adenocarcinoma. -He has seen surgeon [name] -We reviewed his case in tumor board, and recommended neoadjuvant chemotherapy.  Will repeat CT with pancreatic cancer protocol after neoadjuvant chemo. -Staging PET scan from 8/26 reviewed, no metastatic disease, there is some mild uptake in the periaortic lymph node, will monitor in the future scan.  I also discussed incidental finding of thyroid nodule, I ordered thyroid ultrasound for evaluation. -He started first cycle of chemotherapy FOLFIRINOX on May 16, 2023. He tolerated first cycle moderately well with fatigue, cold sensitivity and abdominal pain etc, discussed management of side effect. -Due to significant weight loss and fatigue after first cycle chemo, I reduced his oxaliplatin and irinotecan dose from cycle 2. -He is not tolerating chemo well, his CA19.9 has not changed much since he started chemo. I the option of changing treatment to gemcitabine and abraxane.  -Restaging CT scan from July 04, 2023 showed partial response.  Patient would like to proceed with surgery next if possible.

## 2023-07-09 ENCOUNTER — Inpatient Hospital Stay: Payer: Medicare Other | Admitting: Hematology

## 2023-07-09 ENCOUNTER — Inpatient Hospital Stay (HOSPITAL_BASED_OUTPATIENT_CLINIC_OR_DEPARTMENT_OTHER): Payer: Medicare Other | Admitting: Hematology

## 2023-07-09 ENCOUNTER — Inpatient Hospital Stay: Payer: Medicare Other

## 2023-07-09 DIAGNOSIS — C252 Malignant neoplasm of tail of pancreas: Secondary | ICD-10-CM

## 2023-07-09 MED ORDER — MORPHINE SULFATE ER 15 MG PO TBCR: 15.0000 mg | EXTENDED_RELEASE_TABLET | Freq: Two times a day (BID) | ORAL | 0 refills | Status: DC

## 2023-07-09 MED ORDER — OXYCODONE-ACETAMINOPHEN 5-325 MG PO TABS: 1.0000 | ORAL_TABLET | Freq: Three times a day (TID) | ORAL | 0 refills | Status: DC | PRN

## 2023-07-09 NOTE — Progress Notes (Signed)
[facility]   [phone]) [phone] Fax:([phone]) [phone]   Clinic Follow up Note   Patient Care Team: [name], MD as PCP - General [name], [name], MD as PCP - Cardiology (Cardiology) [name], MD as Consulting Physician (Hematology) 07/09/2023  I connected with Scott [name] on 07/09/23 at  9:20 AM EDT by telephone and verified that I am speaking with the correct person using two identifiers.   I discussed the limitations, risks, security and privacy concerns of performing an evaluation and management service by telephone and the availability of in person appointments. I also discussed with the patient that there may be a patient responsible charge related to this service. The patient expressed understanding and agreed to proceed.   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  -baseline CA19.9 872 -EUS pancreatic mass biopsy confirmed adenocarcinoma. -He has seen surgeon [name] -We reviewed his case in tumor board, and recommended neoadjuvant chemotherapy.  Will repeat CT with pancreatic cancer protocol after neoadjuvant chemo. -Staging PET scan from 8/26 reviewed, no metastatic disease, there is some mild uptake in the periaortic lymph node, will monitor in the future scan.  I also discussed incidental finding of thyroid nodule, I ordered thyroid ultrasound for evaluation. -He started first cycle of chemotherapy FOLFIRINOX on May 16, 2023. He tolerated first cycle moderately well with fatigue, cold sensitivity and abdominal pain etc, discussed management of side effect. -Due  to significant weight loss and fatigue after first cycle chemo, I reduced his oxaliplatin and irinotecan dose from cycle 2. -He is not tolerating chemo well, his CA19.9 has not changed much since he started chemo. I the option of changing treatment to gemcitabine and abraxane.  -Restaging CT scan from July 04, 2023 showed partial response.  Patient would like to proceed with surgery next if possible.  Assessment and Plan    Pancreatic Cancer -I reviewed his restaging CT scan images and discussed the findings with patient and his wife.  Tumor in the tail of the pancreas has decreased in size following chemotherapy. Patient has experienced significant side effects from chemotherapy, including nausea and decreased appetite, but reports some improvement recently.  Patient is very reluctant to continue chemo, despite my plan to changes neoadjuvant chemo regiment.  He would like to proceed with surgery next if possible.   -I reached out to [name].  Await Dr. Criselda Peaches review of CT scan and decision regarding surgery. - If surgery is confirmed, cancel future chemotherapy appointments. -I discussed the role of adjuvant chemotherapy.  Due to his poor tolerance to FOLFIRINOX, I would likely recommend gemcitabine and capecitabine for 4 months.  I discussed with patient, he is open to that.  Pain Management Patient reports ongoing pain, managed with oxycodone and morphine. Patient has been taking an average of three doses per day and is running low on medication. - Refill oxycodone-acetaminophen prescription with 90 tablets. - Refill morphine ER   Plan -CT scan reviewed, he has had a partial response. -Patient likely proceed surgery next, awaiting Dr. Arita Miss final decision. -If he does not proceed with surgery, I plan to see him back 3 weeks  after surgery.   SUMMARY OF ONCOLOGIC HISTORY: Oncology History Overview Note   Cancer Staging  Pancreatic cancer [facility]) Staging form: Exocrine Pancreas,  AJCC 8th Edition - Clinical stage from 05/01/2023: Stage III (cT3, cN2, cM0) - Signed by [name], MD on 05/03/2023     Pancreatic cancer (HCC)  05/01/2023 Cancer Staging   Staging form: Exocrine Pancreas, AJCC 8th Edition - Clinical stage from 05/01/2023: Stage III (cT3, cN2, cM0) - Signed by [name], MD on 05/03/2023   05/02/2023 Initial Diagnosis   Pancreatic cancer (HCC)   05/16/2023 - 06/28/2023 Chemotherapy   Patient is on Treatment Plan : PANCREAS Modified FOLFIRINOX q14d x 4 cycles     07/09/2023 -  Chemotherapy   Patient is on Treatment Plan : PANCREATIC Abraxane D1,8,15 + Gemcitabine D1,8,15 q28d       Discussed the use of AI scribe software for clinical note transcription with the patient, who gave verbal consent to proceed.  History of Present Illness   The patient, a 72 year old male with a history of pancreatic cancer, reports a significant discomfort from the last cycle of chemotherapy. He expresses relief at not having to go for another round of chemotherapy today, stating that the news has lifted his spirits. He mentions that he was able to enjoy a meal at a restaurant for the first time in four months, indicating a possible improvement in his condition. However, he also reports a stomach ache this morning and a muscle spasm in his lower back that has made walking difficult. He mentions that his stomach pain has been on and off, and he has been managing it with pain medication, taking an average of three pills a day. He also reports a decrease in appetite over the past four months, but hopes that his appetite will continue to improve.         REVIEW OF SYSTEMS:   Constitutional: Denies fevers, chills or abnormal weight loss, (+) fatigue  Eyes: Denies blurriness of vision Ears, nose, mouth, throat, and face: Denies mucositis or sore throat Respiratory: Denies cough, dyspnea or wheezes Cardiovascular: Denies palpitation, chest discomfort or lower extremity  swelling Gastrointestinal:  Denies nausea, heartburn or change in bowel habits Skin: Denies abnormal skin rashes Lymphatics: Denies new lymphadenopathy or easy bruising Neurological:Denies numbness, tingling or new weaknesses Behavioral/Psych: Mood is stable, no new changes  All other systems were reviewed with the patient and are negative.  MEDICAL HISTORY:  Past Medical History:  Diagnosis Date   CAD (coronary artery disease)    Cerebrovascular disease    Diabetes mellitus without complication (HCC)    HLD (hyperlipidemia)    HTN (hypertension)    IBS (irritable bowel syndrome)    Patient states did not have   Lumbar stenosis    MI, old    Persistent atrial fibrillation (HCC)    PVD (peripheral vascular disease) (HCC)    Stroke (cerebrum) (HCC)    Tobacco abuse     SURGICAL HISTORY: Past Surgical History:  Procedure Laterality Date   ABDOMINAL AORTOGRAM W/LOWER EXTREMITY N/A 01/25/2017   Procedure: Abdominal Aortogram w/Lower Extremity;  Surgeon: Sherren Kerns, MD;  Location: [facility] INVASIVE CV LAB;  Service: Cardiovascular;  Laterality: N/A;   ANTERIOR CRUCIATE LIGAMENT REPAIR     CARDIAC CATHETERIZATION     CONE   CARDIAC CATHETERIZATION N/A 07/27/2016   Procedure: Left Heart Cath and Coronary Angiography;  Surgeon: Tonny Bollman, MD;  Location: [facility] INVASIVE CV LAB;  Service: Cardiovascular;  Laterality: N/A;  CARDIOVERSION N/A 06/29/2016   Procedure: CARDIOVERSION;  Surgeon: Pricilla Riffle, MD;  Location: [facility] ENDOSCOPY;  Service: Cardiovascular;  Laterality: N/A;   CARDIOVERSION N/A 03/05/2018   Procedure: CARDIOVERSION;  Surgeon: Laurey Morale, MD;  Location: [facility] ENDOSCOPY;  Service: Cardiovascular;  Laterality: N/A;   CHOLECYSTECTOMY     ENDARTERECTOMY Right 12/06/2017   Procedure: ENDARTERECTOMY CAROTID RIGHT;  Surgeon: Nada Libman, MD;  Location: [facility] OR;  Service: Vascular;  Laterality: Right;   ESOPHAGOGASTRODUODENOSCOPY (EGD) WITH PROPOFOL N/A 05/01/2023    Procedure: ESOPHAGOGASTRODUODENOSCOPY (EGD) WITH PROPOFOL;  Surgeon: Willis Modena, MD;  Location: [facility] ENDOSCOPY;  Service: Gastroenterology;  Laterality: N/A;   FINE NEEDLE ASPIRATION N/A 05/01/2023   Procedure: FINE NEEDLE ASPIRATION (FNA) LINEAR;  Surgeon: Willis Modena, MD;  Location: [facility] ENDOSCOPY;  Service: Gastroenterology;  Laterality: N/A;   KNEE SURGERY Bilateral 1982, 1988, 1997, 2000   x 4 times.   LOWER EXTREMITY ANGIOGRAPHY N/A 07/03/2019   Procedure: LOWER EXTREMITY ANGIOGRAPHY;  Surgeon: Sherren Kerns, MD;  Location: [facility] INVASIVE CV LAB;  Service: Cardiovascular;  Laterality: N/A;   LUMBAR LAMINECTOMY/DECOMPRESSION MICRODISCECTOMY Bilateral 09/09/2018   Procedure: Laminectomy and Foraminotomy bilateral Lumbar three-Lumbar four - Lumbar four-Lumbar five;  Surgeon: Julio Sicks, MD;  Location: [facility] OR;  Service: Neurosurgery;  Laterality: Bilateral;   PATCH ANGIOPLASTY Right 12/06/2017   Procedure: PATCH ANGIOPLASTY RIGHT CAROTID ARTERY;  Surgeon: Nada Libman, MD;  Location: [facility] OR;  Service: Vascular;  Laterality: Right;   PERIPHERAL VASCULAR INTERVENTION Right 01/25/2017   Procedure: Peripheral Vascular Intervention;  Surgeon: Sherren Kerns, MD;  Location: [facility] INVASIVE CV LAB;  Service: Cardiovascular;  Laterality: Right;   PERIPHERAL VASCULAR INTERVENTION Right 07/03/2019   Procedure: PERIPHERAL VASCULAR INTERVENTION;  Surgeon: Sherren Kerns, MD;  Location: [facility] INVASIVE CV LAB;  Service: Cardiovascular;  Laterality: Right;   PORTACATH PLACEMENT Left 05/14/2023   Procedure: INSERTION PORT-A-CATH;  Surgeon: Almond Lint, MD;  Location: [facility] OR;  Service: General;  Laterality: Left;   TONSILLECTOMY     UPPER ESOPHAGEAL ENDOSCOPIC ULTRASOUND (EUS) Bilateral 05/01/2023   Procedure: UPPER ESOPHAGEAL ENDOSCOPIC ULTRASOUND (EUS);  Surgeon: Willis Modena, MD;  Location: [facility] ENDOSCOPY;  Service: Gastroenterology;  Laterality: Bilateral;    I have reviewed the social history  and family history with the patient and they are unchanged from previous note.  ALLERGIES:  is allergic to augmentin [amoxicillin-pot clavulanate].  MEDICATIONS:  Current Outpatient Medications  Medication Sig Dispense Refill   amLODipine (NORVASC) 5 MG tablet Take 5 mg by mouth at bedtime.     apixaban (ELIQUIS) 5 MG TABS tablet Take 1 tablet (5 mg total) by mouth 2 (two) times daily. 60 tablet 5   aspirin EC 81 MG tablet Take 81 mg by mouth daily.     diazepam (VALIUM) 2 MG tablet Take 2 mg by mouth 2 (two) times daily as needed for anxiety.     docusate sodium (COLACE) 100 MG capsule Take 200 mg by mouth daily.     esomeprazole (NEXIUM) 40 MG capsule Take 40 mg by mouth daily as needed (heartburn/hiccups).     fenofibrate (TRICOR) 48 MG tablet Take 48 mg by mouth daily.     gabapentin (NEURONTIN) 300 MG capsule Take 900 mg by mouth 2 (two) times daily.     glipiZIDE (GLUCOTROL XL) 5 MG 24 hr tablet Take 5 mg by mouth daily after supper.     hyoscyamine (LEVSIN SL) 0.125 MG SL tablet Place 0.125 mg under the tongue every 4 (  four) hours as needed for cramping.     Insulin Glargine (BASAGLAR KWIKPEN) 100 UNIT/ML Inject 20 Units into the skin daily.     JARDIANCE 25 MG TABS tablet Take 25 mg by mouth daily.     metFORMIN (GLUCOPHAGE-XR) 500 MG 24 hr tablet Take 1,500 mg by mouth daily after supper.     morphine (MS CONTIN) 15 MG 12 hr tablet Take 1 tablet (15 mg total) by mouth every 12 (twelve) hours. 60 tablet 0   ondansetron (ZOFRAN) 4 MG tablet Take 4 mg by mouth every 8 (eight) hours as needed for nausea or vomiting.     oxyCODONE (OXY IR/ROXICODONE) 5 MG immediate release tablet Take 1-2 tablets (5-10 mg total) by mouth every 6 (six) hours as needed for severe pain. 30 tablet 0   oxyCODONE-acetaminophen (PERCOCET/ROXICET) 5-325 MG tablet Take 1 tablet by mouth every 8 (eight) hours as needed for severe pain (pain score 7-10). 90 tablet 0   polyethylene glycol (MIRALAX / GLYCOLAX) 17 g  packet Take 17 g by mouth daily as needed for moderate constipation.     rosuvastatin (CRESTOR) 40 MG tablet Take 40 mg by mouth daily.     sildenafil (VIAGRA) 100 MG tablet Take 100 mg by mouth as needed for erectile dysfunction.     spironolactone (ALDACTONE) 25 MG tablet Take 25 mg by mouth daily.     valsartan (DIOVAN) 320 MG tablet Take 320 mg by mouth daily.     No current facility-administered medications for this visit.    PHYSICAL EXAMINATION: Not performed   LABORATORY DATA:  I have reviewed the data as listed    Latest Ref Rng & Units 06/26/2023    8:51 AM 06/11/2023    9:35 AM 05/29/2023    8:56 AM  CBC  WBC 4.0 - 10.5 K/uL 6.0  6.2  7.2   Hemoglobin 13.0 - 17.0 g/dL 16.1  09.6  04.5   Hematocrit 39.0 - 52.0 % 41.5  40.5  42.6   Platelets 150 - 400 K/uL 192  189  259         Latest Ref Rng & Units 06/26/2023    8:51 AM 06/11/2023    9:35 AM 05/29/2023    8:56 AM  CMP  Glucose 70 - 99 mg/dL 409  811  914   BUN 8 - 23 mg/dL 16  19  18    Creatinine 0.61 - 1.24 mg/dL 7.82  9.56  2.13   Sodium 135 - 145 mmol/L 139  139  139   Potassium 3.5 - 5.1 mmol/L 4.1  4.0  3.6   Chloride 98 - 111 mmol/L 109  108  105   CO2 22 - 32 mmol/L 25  26  26    Calcium 8.9 - 10.3 mg/dL 8.8  8.8  9.0   Total Protein 6.5 - 8.1 g/dL 6.3  6.3  6.5   Total Bilirubin 0.3 - 1.2 mg/dL 0.4  0.4  0.4   Alkaline Phos 38 - 126 U/L 99  80  92   AST 15 - 41 U/L 21  12  37   ALT 0 - 44 U/L 34  22  85       RADIOGRAPHIC STUDIES: I have personally reviewed the radiological images as listed and agreed with the findings in the report. No results found.     I discussed the assessment and treatment plan with the patient. The patient was provided an opportunity to ask questions and all  were answered. The patient agreed with the plan and demonstrated an understanding of the instructions.   The patient was advised to call back or seek an in-person evaluation if the symptoms worsen or if the condition  fails to improve as anticipated.  I provided 22 minutes of non face-to-face telephone visit time during this encounter, and > 50% was spent counseling as documented under my assessment & plan.     [name], MD 07/09/23

## 2023-07-11 ENCOUNTER — Telehealth: Payer: Self-pay | Admitting: Hematology

## 2023-07-12 ENCOUNTER — Telehealth: Payer: Self-pay | Admitting: *Deleted

## 2023-07-12 ENCOUNTER — Other Ambulatory Visit: Payer: Self-pay | Admitting: General Surgery

## 2023-07-12 ENCOUNTER — Telehealth: Payer: Self-pay

## 2023-07-12 DIAGNOSIS — C252 Malignant neoplasm of tail of pancreas: Secondary | ICD-10-CM

## 2023-07-12 NOTE — Telephone Encounter (Signed)
Pre-operative Risk Assessment    Patient Name: Scott [name]  DOB: August 22, 1951 MRN: [id number]   Last office visit 03/12/23 with Dr. Elease Hashimoto  No upcoming visit   Request for Surgical Clearance    Procedure:  pancreatic surgery   Date of Surgery:  Clearance TBD                                 Surgeon:  Dr. Almond Lint  Surgeon's Group or Practice Name:  [facility] Phone number:  [phone] Fax number:  [phone]   Type of Clearance Requested:   - Pharmacy:  Hold Apixaban (Eliquis)     Type of Anesthesia:  General    Additional requests/questions:    [name]   07/12/2023, 3:53 PM

## 2023-07-12 NOTE — Telephone Encounter (Signed)
Please advise holding Eliquis prior to pancreatic surgery.  Thank you!  DW

## 2023-07-12 NOTE — Telephone Encounter (Signed)
error 

## 2023-07-12 NOTE — Telephone Encounter (Signed)
Patient with diagnosis of afib on Eliquis for anticoagulation.    Procedure: pancreatic surgery Date of procedure: TBD  CHA2DS2-VASc Score = 6  This indicates a 9.7% annual risk of stroke. [name] score is based upon: CHF History: 0 HTN History: 1 Diabetes History: 1 Stroke History: 2 Vascular Disease History: 1 Age Score: 1 Gender Score: 0   CVA in 2019  CrCl 67mL/min Platelet count 192K  Per office protocol, patient can hold Eliquis for 2 days prior to procedure.    **This guidance is not considered finalized until pre-operative APP has relayed final recommendations.**

## 2023-07-15 ENCOUNTER — Telehealth: Payer: Self-pay | Admitting: Hematology

## 2023-07-15 ENCOUNTER — Other Ambulatory Visit: Payer: Self-pay | Admitting: Pharmacist

## 2023-07-15 ENCOUNTER — Telehealth: Payer: Self-pay | Admitting: *Deleted

## 2023-07-15 ENCOUNTER — Other Ambulatory Visit: Payer: Self-pay | Admitting: *Deleted

## 2023-07-15 DIAGNOSIS — C252 Malignant neoplasm of tail of pancreas: Secondary | ICD-10-CM

## 2023-07-15 MED ORDER — HAEMOPHILUS B POLYSAC CONJ VAC IM SOLR: 0.5000 mL | Freq: Once | INTRAMUSCULAR | Status: DC

## 2023-07-15 MED ORDER — MENINGOCOCCAL VAC B (OMV) IM SUSY
0.5000 mL | PREFILLED_SYRINGE | Freq: Once | INTRAMUSCULAR | Status: DC
Start: 2023-07-15 — End: 2023-07-15

## 2023-07-15 MED ORDER — PNEUMOCOCCAL 20-VAL CONJ VACC 0.5 ML IM SUSY: 0.5000 mL | PREFILLED_SYRINGE | Freq: Once | INTRAMUSCULAR | Status: DC

## 2023-07-15 MED ORDER — MENINGOCOCCAL A C Y&W-135 OLIG IM SOLN: 0.5000 mL | Freq: Once | INTRAMUSCULAR | Status: DC

## 2023-07-15 NOTE — Telephone Encounter (Signed)
Pt has been scheduled for tele pre op appt 07/26/23. Pt states surgeon trying to get surgery done early to mid Nov. Med rec and consent are done.

## 2023-07-15 NOTE — Telephone Encounter (Signed)
Pt has been scheduled for tele pre op appt 07/26/23. Pt states surgeon trying to get surgery done early to mid Nov. Med rec and consent are done.     Patient Consent for Virtual Visit        Scott [name] has provided verbal consent on 07/15/2023 for a virtual visit (video or telephone).   CONSENT FOR VIRTUAL VISIT FOR:  Scott [name]  By participating in this virtual visit I agree to the following:  I hereby voluntarily request, consent and authorize [facility] and its employed or contracted physicians, physician assistants, nurse practitioners or other licensed health care professionals (the Practitioner), to provide me with telemedicine health care services (the "Services") as deemed necessary by the treating Practitioner. I acknowledge and consent to receive the Services by the Practitioner via telemedicine. I understand that the telemedicine visit will involve communicating with the Practitioner through live audiovisual communication technology and the disclosure of certain medical information by electronic transmission. I acknowledge that I have been given the opportunity to request an in-person assessment or other available alternative prior to the telemedicine visit and am voluntarily participating in the telemedicine visit.  I understand that I have the right to withhold or withdraw my consent to the use of telemedicine in the course of my care at any time, without affecting my right to future care or treatment, and that the Practitioner or I may terminate the telemedicine visit at any time. I understand that I have the right to inspect all information obtained and/or recorded in the course of the telemedicine visit and may receive copies of available information for a reasonable fee.  I understand that some of the potential risks of receiving the Services via telemedicine include:  Delay or interruption in medical evaluation due to technological equipment failure or  disruption; Information transmitted may not be sufficient (e.g. poor resolution of images) to allow for appropriate medical decision making by the Practitioner; and/or  In rare instances, security protocols could fail, causing a breach of personal health information.  Furthermore, I acknowledge that it is my responsibility to provide information about my medical history, conditions and care that is complete and accurate to the best of my ability. I acknowledge that Practitioner's advice, recommendations, and/or decision may be based on factors not within their control, such as incomplete or inaccurate data provided by me or distortions of diagnostic images or specimens that may result from electronic transmissions. I understand that the practice of medicine is not an exact science and that Practitioner makes no warranties or guarantees regarding treatment outcomes. I acknowledge that a copy of this consent can be made available to me via my patient portal [facility] MyChart), or I can request a printed copy by calling the office of [facility].    I understand that my insurance will be billed for this visit.   I have read or had this consent read to me. I understand the contents of this consent, which adequately explains the benefits and risks of the Services being provided via telemedicine.  I have been provided ample opportunity to ask questions regarding this consent and the Services and have had my questions answered to my satisfaction. I give my informed consent for the services to be provided through the use of telemedicine in my medical care

## 2023-07-15 NOTE — Telephone Encounter (Signed)
Name: Scott [name]  DOB: 10-Jul-1951  MRN: [id number]  Primary Cardiologist: Kristeen Miss, MD   Preoperative team, please contact this patient and set up a phone call appointment for further preoperative risk assessment. Please obtain consent and complete medication review. Thank you for your help.  I confirm that guidance regarding antiplatelet and oral anticoagulation therapy has been completed and, if necessary, noted below.  Per office protocol, patient can hold Eliquis for 2 days prior to procedure.   I also confirmed the patient resides in the state of [place]. As per [facility] Medical Board telemedicine laws, the patient must reside in the state in which the provider is licensed.   Ronney Asters, NP 07/15/2023, 11:00 AM [facility]

## 2023-07-16 ENCOUNTER — Ambulatory Visit: Payer: Medicare Other | Admitting: Hematology

## 2023-07-16 ENCOUNTER — Ambulatory Visit: Payer: Medicare Other

## 2023-07-16 ENCOUNTER — Inpatient Hospital Stay: Payer: Medicare Other

## 2023-07-16 ENCOUNTER — Other Ambulatory Visit: Payer: Medicare Other

## 2023-07-16 DIAGNOSIS — C252 Malignant neoplasm of tail of pancreas: Secondary | ICD-10-CM

## 2023-07-16 DIAGNOSIS — Z5111 Encounter for antineoplastic chemotherapy: Secondary | ICD-10-CM | POA: Diagnosis not present

## 2023-07-16 MED ORDER — MENINGOCOCCAL A C Y&W-135 OLIG IM SOLN
0.5000 mL | Freq: Once | INTRAMUSCULAR | Status: AC
  Administered 2023-07-16: 0.5 mL via INTRAMUSCULAR
  Filled 2023-07-16: qty 0.5

## 2023-07-16 MED ORDER — HAEMOPHILUS B POLYSAC CONJ VAC IM SOLR
0.5000 mL | Freq: Once | INTRAMUSCULAR | Status: AC
  Administered 2023-07-16: 0.5 mL via INTRAMUSCULAR
  Filled 2023-07-16: qty 0.5

## 2023-07-16 MED ORDER — MENINGOCOCCAL VAC B (OMV) IM SUSY
0.5000 mL | PREFILLED_SYRINGE | Freq: Once | INTRAMUSCULAR | Status: AC
  Administered 2023-07-16: 0.5 mL via INTRAMUSCULAR
  Filled 2023-07-16: qty 0.5

## 2023-07-16 MED ORDER — PNEUMOCOCCAL 20-VAL CONJ VACC 0.5 ML IM SUSY
0.5000 mL | PREFILLED_SYRINGE | Freq: Once | INTRAMUSCULAR | Status: AC
  Administered 2023-07-16: 0.5 mL via INTRAMUSCULAR
  Filled 2023-07-16: qty 0.5

## 2023-07-23 ENCOUNTER — Other Ambulatory Visit: Payer: Medicare Other

## 2023-07-23 ENCOUNTER — Ambulatory Visit: Payer: Medicare Other | Admitting: Hematology

## 2023-07-23 ENCOUNTER — Ambulatory Visit: Payer: Medicare Other

## 2023-07-25 ENCOUNTER — Telehealth: Payer: Self-pay

## 2023-07-25 ENCOUNTER — Other Ambulatory Visit: Payer: Self-pay | Admitting: Hematology

## 2023-07-25 ENCOUNTER — Other Ambulatory Visit: Payer: Self-pay

## 2023-07-25 NOTE — Telephone Encounter (Signed)
Pt called requesting a refill on Oxycodone IR 5mg .  Pt stated he's all out an needs a refill.  Pt stated during conversation he's only taking the Morphine (MS Contin) once a day d/t how the morphine makes him feel throughout the day. Stated to pt that the morphine was prescribed as a long-acting pain medicine and the Oxycodone & Percocet prescriptions are for short-acting break through pain.  Stated pt needs to take the morphine twice daily as prescribed to better manage his pain.  Pt verbalized understanding but continue to refuse to take the morphine as prescribed.  Pt takes morphine only at night.  Pt also stated he doesn't want to get addicted to morphine.  Stated to pt that the Oxycodone and Percocet can be addictive too.  Pt verbalized understanding but stated he finds better pain relief with the Oxycodone & Percocet.  Stated this nurse will make Dr. Mosetta Putt and her Team aware of his request.

## 2023-07-26 ENCOUNTER — Ambulatory Visit: Payer: Medicare Other | Attending: Cardiovascular Disease | Admitting: Student

## 2023-07-26 ENCOUNTER — Telehealth: Payer: Self-pay | Admitting: Hematology

## 2023-07-26 DIAGNOSIS — Z01810 Encounter for preprocedural cardiovascular examination: Secondary | ICD-10-CM | POA: Diagnosis not present

## 2023-07-26 NOTE — Progress Notes (Signed)
Virtual Visit via Telephone Note   Because of Scott [name]'s co-morbid illnesses, he is at least at moderate risk for complications without adequate follow up.  This format is felt to be most appropriate for this patient at this time.  The patient did not have access to video technology/had technical difficulties with video requiring transitioning to audio format only (telephone).  All issues noted in this document were discussed and addressed.  No physical exam could be performed with this format.  Please refer to the patient's chart for his consent to telehealth for [facility].  Evaluation Performed:  Preoperative cardiovascular risk assessment _____________   Date:  07/26/2023   Patient ID:  Scott [name], DOB [birth year], MRN [id number] Patient Location:  Home Provider location:   Office  Primary Care Provider:  [name], MD Primary Cardiologist:  Kristeen Miss, MD  Chief Complaint / Patient Profile   72 y.o. y/o male with a h/o CAD s/p PCI with DES to distal RCA, PAF, carotid artery stenosis, PAD, hypertension, hyperlipidemia, CVA, T2DM, pancreatic cancer, tobacco abuse who is pending pancreatic surgery by [name] and presents today for telephonic preoperative cardiovascular risk assessment.  History of Present Illness    Scott [name] is a 72 y.o. male who presents via audio/video conferencing for a telehealth visit today.  Pt was last seen in cardiology clinic on 03/12/2023 by Dr. Elease Hashimoto.  At that time SELBY FOISY was stable from a cardiac standpoint.  The patient is now pending procedure as outlined above. Since his last visit, he is doing well. Patient denies shortness of breath, dyspnea on exertion, lower extremity edema, orthopnea or PND. No chest pain, pressure, or tightness. No palpitations. His activity has been very limited secondary to weakness from chemotherapy and weight loss. He finished that up 4 weeks ago but has not regained his  strength. He is independent with ADLs and able to do light to moderate household chores.   Past Medical History    Past Medical History:  Diagnosis Date   CAD (coronary artery disease)    Cerebrovascular disease    Diabetes mellitus without complication (HCC)    HLD (hyperlipidemia)    HTN (hypertension)    IBS (irritable bowel syndrome)    Patient states did not have   Lumbar stenosis    MI, old    Persistent atrial fibrillation (HCC)    PVD (peripheral vascular disease) (HCC)    Stroke (cerebrum) (HCC)    Tobacco abuse    Past Surgical History:  Procedure Laterality Date   ABDOMINAL AORTOGRAM W/LOWER EXTREMITY N/A 01/25/2017   Procedure: Abdominal Aortogram w/Lower Extremity;  Surgeon: Sherren Kerns, MD;  [facility] IN[facility] LAB;  Service: Cardiovascular;  Laterality: N/A;   ANTERIOR CRUCIATE LIGAMENT REPAIR     CARDIAC CATHETERIZATION     CONE   CARDIAC CATHETERIZATION N/A 07/27/2016   Procedure: Left Heart Cath and Coronary Angiography;  Surgeon: Tonny Bollman, MD;  Location: [facility]VAS[facility] CV LAB;  Service: Cardiovascular;  Laterality: N/A;   CARDIOVERSION N/A 06/29/2016   Procedure: CARDIOVERSION;  Surgeon: Pricilla Riffle, MD;  Location: [facility] ENDOSCOPY;  Service: Cardiovascular;  Laterality: N/A;   CARDIOVERSION N/A 03/05/2018   Procedure: CARDIOVERSION;  Surgeon: Laurey Morale, MD;  Location: [facility] ENDOSCOPY;  Service: Cardiovascular;  Laterality: N/A;   CHOLECYSTECTOMY     ENDARTERECTOMY Right 12/06/2017   Procedure: ENDARTERECTOMY CAROTID RIGHT;  Surgeon: Nada Libman, MD;  [facility]: [facility] OR;  Service: Vascular;  Laterality: Right;   ESOPHAGOGASTRODUODENOSCOPY (EGD) WITH PROPOFOL N/A 05/01/2023   Procedure: ESOPHAGOGASTRODUODENOSCOPY (EGD) WITH PROPOFOL;  Surgeon: Willis Modena, MD;  Location: [facility] ENDOSCOPY;  Service: Gastroenterology;  Laterality: N/A;   FINE NEEDLE ASPIRATION N/A 05/01/2023   Procedure: FINE NEEDLE ASPIRATION (FNA) LINEAR;  Surgeon: Willis Modena, MD;  Location: [facility] ENDOSCOPY;  Service: Gastroenterology;  Laterality: N/A;   KNEE SURGERY Bilateral 1982, 1988, 1997, 2000   x 4 times.   LOWER EXTREMITY ANGIOGRAPHY N/A 07/03/2019   Procedure: LOWER EXTREMITY ANGIOGRAPHY;  Surgeon: Sherren Kerns, MD;  [facility]: [facility] INVAS[facility] CV LAB;  Service: Cardiovascular;  Laterality: N/A;   LUMBAR LAMINECTOMY/DECOMPRESSION MICRODISCECTOMY Bilateral 09/09/2018   Procedure: Laminectomy and Foraminotomy bilateral Lumbar three-Lumbar four - Lumbar four-Lumbar five;  Surgeon: Julio Sicks, MD;  Location: [facility] OR;  Service: Neurosurgery;  Laterality: Bilateral;   PATCH ANGIOPLASTY Right 12/06/2017   Procedure: PATCH ANGIOPLASTY RIGHT CAROTID ARTERY;  Surgeon: Nada Libman, MD;  Location: [facility] OR;  Service: Vascular;  Laterality: Right;   PERIPHERAL VASCULAR INTERVENTION Right 01/25/2017   Procedure: Peripheral Vascular Intervention;  Surgeon: Sherren Kerns, MD;  Location: [facility] INVASIVE CV LAB;  Service: Cardiovascular;  Laterality: Right;   PERIPHERAL VASCULAR INTERVENTION Right 07/03/2019   Procedure: PERIPHERAL VASCULAR INTERVENTION;  Surgeon: Sherren Kerns, MD;  [facility] LAB;  Service: Cardiovascular;  Laterality: Right;   PORTACATH PLACEMENT Left 05/14/2023   Procedure: INSERTION PORT-A-CATH;  Surgeon: Almond Lint, MD;  Location: [facility] OR;  Service: General;  Laterality: Left;   TONSILLECTOMY     UPPER ESOPHAGEAL ENDOSCOPIC ULTRASOUND (EUS) Bilateral 05/01/2023   Procedure: UPPER ESOPHAGEAL ENDOSCOPIC ULTRASOUND (EUS);  Surgeon: Willis Modena, MD;  Location: [facility] ENDOSCOPY;  Service: Gastroenterology;  Laterality: Bilateral;    Allergies  Allergies  Allergen Reactions   Augmentin [Amoxicillin-Pot Clavulanate] Rash    Home Medications    Prior to Admission medications   Medication Sig Start Date End Date Taking? Authorizing Provider  amLODipine (NORVASC) 5 MG tablet Take 5 mg by mouth at bedtime. 11/29/22   [provider]  apixaban (ELIQUIS) 5 MG TABS tablet Take 1 tablet (5 mg total) by mouth 2 (two) times daily. 07/03/23   [name], [name], MD  aspirin EC 81 MG tablet Take 81 mg by mouth daily. 09/08/20   [provider]  diazepam (VALIUM) 2 MG tablet Take 2 mg by mouth 2 (two) times daily as needed for anxiety. 02/18/20   [provider]  docusate sodium (COLACE) 100 MG capsule Take 200 mg by mouth daily.    [provider]  esomeprazole (NEXIUM) 40 MG capsule Take 40 mg by mouth daily as needed (heartburn/hiccups).    [provider]  fenofibrate (TRICOR) 48 MG tablet Take 48 mg by mouth daily. 01/16/22   [provider]  gabapentin (NEURONTIN) 300 MG capsule Take 900 mg by mouth 2 (two) times daily. 02/03/20   [provider]  glipiZIDE (GLUCOTROL XL) 5 MG 24 hr tablet Take 5 mg by mouth daily after supper. 01/09/23   [provider]  hyoscyamine (LEVSIN SL) 0.125 MG SL tablet Place 0.125 mg under the tongue every 4 (four) hours as needed for cramping.    [provider]  Insulin Glargine (BASAGLAR KWIKPEN) 100 UNIT/ML Inject 20 Units into the skin daily.    [provider]  JARDIANCE 25 MG TABS tablet Take 25 mg by mouth daily. 03/03/22   [provider]  metFORMIN (GLUCOPHAGE-XR)  500 MG 24 hr tablet Take 1,500 mg by mouth daily after supper. 01/10/22   [provider]  morphine (MS CONTIN) 15 MG 12 hr tablet Take 1 tablet (15 mg total) by mouth every 12 (twelve) hours. 07/09/23   [name], MD  ondansetron (ZOFRAN) 4 MG tablet Take 4 mg by mouth every 8 (eight) hours as needed for nausea or vomiting.    [provider]  oxyCODONE-acetaminophen (PERCOCET/ROXICET) 5-325 MG tablet Take 1 tablet by mouth every 8 (eight) hours as needed for severe pain (pain score 7-10). 07/09/23   [name], MD  polyethylene glycol (MIRALAX / GLYCOLAX) 17 g packet Take 17 g by mouth daily as needed for moderate  constipation.    [provider]  rosuvastatin (CRESTOR) 40 MG tablet Take 40 mg by mouth daily.    [provider]  sildenafil (VIAGRA) 100 MG tablet Take 100 mg by mouth as needed for erectile dysfunction. 04/14/18   [provider]  spironolactone (ALDACTONE) 25 MG tablet Take 25 mg by mouth daily.    [provider]  valsartan (DIOVAN) 320 MG tablet Take 320 mg by mouth daily. 02/22/20   [provider]    Physical Exam    Vital Signs:  GUILHERME SCHWENKE does not have vital signs available for review today.  Given telephonic nature of communication, physical exam is limited. AAOx3. NAD. Normal affect.  Speech and respirations are unlabored.  Accessory Clinical Findings    None  Assessment & Plan    Primary Cardiologist: Kristeen Miss, MD  Preoperative cardiovascular risk assessment.  Pancreatic surgery by [name].  Chart reviewed as part of pre-operative protocol coverage. According to the RCRI, patient has a 6.6% risk of MACE. Patient reports activity equivalent to 5.07 METS (per [employer]).   Given past medical history and time since last visit, based on ACC/AHA guidelines, STEWART SASAKI would be at acceptable risk for the planned procedure without further cardiovascular testing.   Patient was advised that if he develops new symptoms prior to surgery to contact our office to arrange a follow-up appointment.  he verbalized understanding.  Per Pharm D, patient may hold Eliquis for 2 days prior to procedure.    I will route this recommendation to the requesting party via Epic fax function.  Please call with questions.  Time:   Today, I have spent 7 minutes with the patient with telehealth technology discussing medical history, symptoms, and management plan.     Carlos Levering, NP  07/26/2023, 8:09 AM

## 2023-07-26 NOTE — Telephone Encounter (Signed)
 Left patient a message regarding scheduled appointment times/dates

## 2023-07-31 ENCOUNTER — Telehealth: Payer: Medicare Other | Admitting: Hematology

## 2023-07-31 ENCOUNTER — Other Ambulatory Visit: Payer: Self-pay

## 2023-07-31 ENCOUNTER — Telehealth: Payer: Self-pay

## 2023-07-31 NOTE — Telephone Encounter (Signed)
The patient has an appointment with Dr. Mosetta Putt tomorrow. I can fill this if she wants me to after she talks to him.  Herbert Seta

## 2023-07-31 NOTE — Telephone Encounter (Signed)
Patient called in wanting to get a refill on his oxycodone Percocet sent to his pharmacy. He stated he is having to use a little more than prescribed so may need to increase.

## 2023-08-01 ENCOUNTER — Telehealth: Payer: Self-pay

## 2023-08-01 ENCOUNTER — Inpatient Hospital Stay: Payer: Medicare Other | Attending: Hematology | Admitting: Hematology

## 2023-08-01 ENCOUNTER — Other Ambulatory Visit: Payer: Self-pay

## 2023-08-01 DIAGNOSIS — C252 Malignant neoplasm of tail of pancreas: Secondary | ICD-10-CM

## 2023-08-01 MED ORDER — OXYCODONE-ACETAMINOPHEN 5-325 MG PO TABS: 1.0000 | ORAL_TABLET | Freq: Three times a day (TID) | ORAL | 0 refills | Status: DC | PRN

## 2023-08-01 NOTE — Progress Notes (Signed)
[facility]   Telephone:([phone]) [phone] [phone]) [phone]   Clinic Follow up Note   Patient Care Team: [name], MD as PCP - General [name], [name], MD as PCP - Cardiology (Cardiology) [name], MD as Consulting Physician (Hematology) 08/01/2023  I connected with Scott [name] on 08/01/23 at 11:40 AM EST by telephone and verified that I am speaking with the correct person using two identifiers.   I discussed the limitations, risks, security and privacy concerns of performing an evaluation and management service by telephone and the availability of in person appointments. I also discussed with the patient that there may be a patient responsible charge related to this service. The patient expressed understanding and agreed to proceed.   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  -baseline CA19.9 872 -EUS pancreatic mass biopsy confirmed adenocarcinoma. -He has seen surgeon [name] -We reviewed his case in tumor board, and recommended neoadjuvant chemotherapy.  Will repeat CT with pancreatic cancer protocol after neoadjuvant chemo. -Staging PET scan from 8/26 reviewed, no metastatic disease, there is some mild uptake in the periaortic lymph node, will monitor in the future scan.  I also discussed incidental finding of thyroid nodule, I ordered thyroid ultrasound for evaluation. -He started first cycle of chemotherapy FOLFIRINOX on May 16, 2023. He tolerated first cycle moderately well with fatigue, cold sensitivity and abdominal pain etc, discussed management of side effect. -Due to  significant weight loss and fatigue after first cycle chemo, I reduced his oxaliplatin and irinotecan dose from cycle 2. -He is not tolerating chemo well, his CA19.9 has not changed much since he started chemo. I the option of changing treatment to gemcitabine and abraxane.  -Restaging CT scan from July 04, 2023 showed partial response.  Patient would like to proceed with surgery next if possible.  Assessment and Plan    Pancreatic Cancer Patient reports ongoing weakness, taste changes, and intermittent abdominal pain. He is one month post-chemotherapy and awaiting surgery. -Encourage patient to contact surgeon's office to expedite scheduling of surgery. -Plan for follow-up post-surgery.  Pain Management Patient is taking Percocet (oxycodone with acetaminophen) and MS Contin (long-acting morphine) for pain control. He reports variable pain levels and has been adjusting his medication use accordingly. -Refill Percocet prescription as requested. -Continue MS Contin as tolerated.  Nutrition/Weight Gain Patient reports difficulty gaining weight and changes in taste affecting his appetite. -Encourage continued efforts to increase caloric intake as tolerated.  General Health Maintenance -Continue Eliquis as directed by cardiologist. -Encourage patient to communicate date of surgery when known for appropriate follow-up scheduling.     Plan -I refilled his Percocet -He will contact Dr. Arita Miss office to schedule his surgery -Plan to see him back after surgery.  Patient will call me when he knows he is date of surgery.    SUMMARY OF ONCOLOGIC HISTORY: Oncology History Overview Note   Cancer Staging  Pancreatic cancer [facility]) Staging form: Exocrine Pancreas, AJCC 8th Edition - Clinical stage from 05/01/2023: Stage III (cT3, cN2, cM0) - Signed by [name], MD on 05/03/2023     Pancreatic cancer (HCC)  05/01/2023 Cancer Staging   Staging form:  Exocrine Pancreas, AJCC 8th Edition -  Clinical stage from 05/01/2023: Stage III (cT3, cN2, cM0) - Signed by [name], MD on 05/03/2023   05/02/2023 Initial Diagnosis   Pancreatic cancer (HCC)   05/16/2023 - 06/28/2023 Chemotherapy   Patient is on Treatment Plan : PANCREAS Modified FOLFIRINOX q14d x 4 cycles     07/09/2023 -  Chemotherapy   Patient is on Treatment Plan : PANCREATIC Abraxane D1,8,15 + Gemcitabine D1,8,15 q28d       Discussed the use of AI scribe software for clinical note transcription with the patient, who gave verbal consent to proceed.  History of Present Illness   A 72 year old patient with pancreatic cancer reports ongoing weakness, difficulty with eating, taste changes, and intermittent stomach pain. The patient notes some improvement since his last chemotherapy session a month ago, but he is not as strong as he had hoped to be at this point. He is able to run errands and do some activities, but he tires quickly and has to minimize walking. He also reports difficulty gaining weight, hovering around 138-139 pounds despite attempts to eat more. [name] taste changes have made it challenging to find foods that are appealing, and he often loses interest in meals partway through. Despite these challenges, the patient reports that his bowel movements have been regular and normal. The patient also discusses his pain management regimen, noting that he often takes more than the prescribed three Percocet pills per day due to fluctuating pain levels. He also takes MS Contin (morphine) at night, but has been avoiding taking it in the morning to avoid feeling lightheaded when he needs to drive.         REVIEW OF SYSTEMS:   Constitutional: Denies fevers, chills or abnormal weight loss Eyes: Denies blurriness of vision Ears, nose, mouth, throat, and face: Denies mucositis or sore throat Respiratory: Denies cough, dyspnea or wheezes Cardiovascular: Denies palpitation, chest discomfort or lower extremity  swelling Gastrointestinal:  Denies nausea, heartburn or change in bowel habits Skin: Denies abnormal skin rashes Lymphatics: Denies new lymphadenopathy or easy bruising Neurological:Denies numbness, tingling or new weaknesses Behavioral/Psych: Mood is stable, no new changes  All other systems were reviewed with the patient and are negative.  MEDICAL HISTORY:  Past Medical History:  Diagnosis Date   CAD (coronary artery disease)    Cerebrovascular disease    Diabetes mellitus without complication (HCC)    HLD (hyperlipidemia)    HTN (hypertension)    IBS (irritable bowel syndrome)    Patient states did not have   Lumbar stenosis    MI, old    Persistent atrial fibrillation (HCC)    PVD (peripheral vascular disease) (HCC)    Stroke (cerebrum) (HCC)    Tobacco abuse     SURGICAL HISTORY: Past Surgical History:  Procedure Laterality Date   ABDOMINAL AORTOGRAM W/LOWER EXTREMITY N/A 01/25/2017   Procedure: Abdominal Aortogram w/Lower Extremity;  Surgeon: Sherren Kerns, MD;  Location: [facility] INVASIVE CV LAB;  Service: Cardiovascular;  Laterality: N/A;   ANTERIOR CRUCIATE LIGAMENT REPAIR     CARDIAC CATHETERIZATION     CONE   CARDIAC CATHETERIZATION N/A 07/27/2016   Procedure: Left Heart Cath and Coronary Angiography;  Surgeon: Tonny Bollman, MD;  Location: [facility] INVASIVE CV LAB;  Service: Cardiovascular;  Laterality: N/A;   CARDIOVERSION N/A 06/29/2016   Procedure: CARDIOVERSION;  Surgeon: Pricilla Riffle, MD;  Location: [facility] ENDOSCOPY;  Service: Cardiovascular;  Laterality: N/A;   CARDIOVERSION N/A 03/05/2018   Procedure: CARDIOVERSION;  Surgeon: Laurey Morale, MD;  Location: [facility] ENDOSCOPY;  Service: Cardiovascular;  Laterality: N/A;   CHOLECYSTECTOMY     ENDARTERECTOMY Right 12/06/2017   Procedure: ENDARTERECTOMY CAROTID RIGHT;  Surgeon: Nada Libman, MD;  Location: [facility] OR;  Service: Vascular;  Laterality: Right;   ESOPHAGOGASTRODUODENOSCOPY (EGD) WITH PROPOFOL N/A 05/01/2023    Procedure: ESOPHAGOGASTRODUODENOSCOPY (EGD) WITH PROPOFOL;  Surgeon: Willis Modena, MD;  Location: [facility] ENDOSCOPY;  Service: Gastroenterology;  Laterality: N/A;   FINE NEEDLE ASPIRATION N/A 05/01/2023   Procedure: FINE NEEDLE ASPIRATION (FNA) LINEAR;  Surgeon: Willis Modena, MD;  Location: [facility] ENDOSCOPY;  Service: Gastroenterology;  Laterality: N/A;   KNEE SURGERY Bilateral 1982, 1988, 1997, 2000   x 4 times.   LOWER EXTREMITY ANGIOGRAPHY N/A 07/03/2019   Procedure: LOWER EXTREMITY ANGIOGRAPHY;  Surgeon: Sherren Kerns, MD;  [facility]: [facility] INVASIVE CV LAB;  Service: Cardiovascular;  Laterality: N/A;   LUMBAR LAMINECTOMY/DECOMPRESSION MICRODISCECTOMY Bilateral 09/09/2018   Procedure: Laminectomy and Foraminotomy bilateral Lumbar three-Lumbar four - Lumbar four-Lumbar five;  Surgeon: Julio Sicks, MD;  Location: [facility] OR;  Service: Neurosurgery;  Laterality: Bilateral;   PATCH ANGIOPLASTY Right 12/06/2017   Procedure: PATCH ANGIOPLASTY RIGHT CAROTID ARTERY;  Surgeon: Nada Libman, MD;  Location: [facility] OR;  Service: Vascular;  Laterality: Right;   PERIPHERAL VASCULAR INTERVENTION Right 01/25/2017   Procedure: Peripheral Vascular Intervention;  Surgeon: Sherren Kerns, MD;  Location[facility] INVASIVE CV LAB;  Service: Cardiovascular;  Laterality: Right;   PERIPHERAL VASCULAR INTERVENTION Right 07/03/2019   Procedure: PERIPHERAL VASCULAR INTERVENTION;  Surgeon: Sherren Kerns, MD;  [facility]VAS[facility] CV LAB;  Service: Cardiovascular;  Laterality: Right;   PORTACATH PLACEMENT Left 05/14/2023   Procedure: INSERTION PORT-A-CATH;  Surgeon: Almond Lint, MD;  Location: [facility] OR;  Service: General;  Laterality: Left;   TONSILLECTOMY     UPPER ESOPHAGEAL ENDOSCOPIC ULTRASOUND (EUS) Bilateral 05/01/2023   Procedure: UPPER ESOPHAGEAL ENDOSCOPIC ULTRASOUND (EUS);  Surgeon: Willis Modena, MD;  Location: [facility] ENDOSCOPY;  Service: Gastroenterology;  Laterality: Bilateral;    I have reviewed the social history  and family history with the patient and they are unchanged from previous note.  ALLERGIES:  is allergic to augmentin [amoxicillin-pot clavulanate].  MEDICATIONS:  Current Outpatient Medications  Medication Sig Dispense Refill   amLODipine (NORVASC) 5 MG tablet Take 5 mg by mouth at bedtime.     apixaban (ELIQUIS) 5 MG TABS tablet Take 1 tablet (5 mg total) by mouth 2 (two) times daily. 60 tablet 5   aspirin EC 81 MG tablet Take 81 mg by mouth daily.     diazepam (VALIUM) 2 MG tablet Take 2 mg by mouth 2 (two) times daily as needed for anxiety.     docusate sodium (COLACE) 100 MG capsule Take 200 mg by mouth daily.     esomeprazole (NEXIUM) 40 MG capsule Take 40 mg by mouth daily as needed (heartburn/hiccups).     fenofibrate (TRICOR) 48 MG tablet Take 48 mg by mouth daily.     gabapentin (NEURONTIN) 300 MG capsule Take 900 mg by mouth 2 (two) times daily.     glipiZIDE (GLUCOTROL XL) 5 MG 24 hr tablet Take 5 mg by mouth daily after supper.     hyoscyamine (LEVSIN SL) 0.125 MG SL tablet Place 0.125 mg under the tongue every 4 (four) hours as needed for cramping.     Insulin Glargine (BASAGLAR KWIKPEN) 100 UNIT/ML Inject 20 Units into the skin daily.     JARDIANCE 25 MG TABS tablet Take  25 mg by mouth daily.     metFORMIN (GLUCOPHAGE-XR) 500 MG 24 hr tablet Take 1,500 mg by mouth daily after supper.     morphine (MS CONTIN) 15 MG 12 hr tablet Take 1 tablet (15 mg total) by mouth every 12 (twelve) hours. 60 tablet 0   ondansetron (ZOFRAN) 4 MG tablet Take 4 mg by mouth every 8 (eight) hours as needed for nausea or vomiting.     oxyCODONE-acetaminophen (PERCOCET/ROXICET) 5-325 MG tablet Take 1 tablet by mouth every 8 (eight) hours as needed for severe pain (pain score 7-10). 90 tablet 0   polyethylene glycol (MIRALAX / GLYCOLAX) 17 g packet Take 17 g by mouth daily as needed for moderate constipation.     rosuvastatin (CRESTOR) 40 MG tablet Take 40 mg by mouth daily.     sildenafil (VIAGRA) 100  MG tablet Take 100 mg by mouth as needed for erectile dysfunction.     spironolactone (ALDACTONE) 25 MG tablet Take 25 mg by mouth daily.     valsartan (DIOVAN) 320 MG tablet Take 320 mg by mouth daily.     No current facility-administered medications for this visit.    PHYSICAL EXAMINATION: Not performed   LABORATORY DATA:  I have reviewed the data as listed    Latest Ref Rng & Units 06/26/2023    8:51 AM 06/11/2023    9:35 AM 05/29/2023    8:56 AM  CBC  WBC 4.0 - 10.5 K/uL 6.0  6.2  7.2   Hemoglobin 13.0 - 17.0 g/dL 16.1  09.6  04.5   Hematocrit 39.0 - 52.0 % 41.5  40.5  42.6   Platelets 150 - 400 K/uL 192  189  259         Latest Ref Rng & Units 06/26/2023    8:51 AM 06/11/2023    9:35 AM 05/29/2023    8:56 AM  CMP  Glucose 70 - 99 mg/dL 409  811  914   BUN 8 - 23 mg/dL 16  19  18    Creatinine 0.61 - 1.24 mg/dL 7.82  9.56  2.13   Sodium 135 - 145 mmol/L 139  139  139   Potassium 3.5 - 5.1 mmol/L 4.1  4.0  3.6   Chloride 98 - 111 mmol/L 109  108  105   CO2 22 - 32 mmol/L 25  26  26    Calcium 8.9 - 10.3 mg/dL 8.8  8.8  9.0   Total Protein 6.5 - 8.1 g/dL 6.3  6.3  6.5   Total Bilirubin 0.3 - 1.2 mg/dL 0.4  0.4  0.4   Alkaline Phos 38 - 126 U/L 99  80  92   AST 15 - 41 U/L 21  12  37   ALT 0 - 44 U/L 34  22  85       RADIOGRAPHIC STUDIES: I have personally reviewed the radiological images as listed and agreed with the findings in the report. No results found.     I discussed the assessment and treatment plan with the patient. The patient was provided an opportunity to ask questions and all were answered. The patient agreed with the plan and demonstrated an understanding of the instructions.   The patient was advised to call back or seek an in-person evaluation if the symptoms worsen or if the condition fails to improve as anticipated.  I provided 15 minutes of non face-to-face telephone visit time during this encounter, and > 50% was spent counseling as  documented  under my assessment & plan.     [name], MD 08/01/23

## 2023-08-01 NOTE — Assessment & Plan Note (Signed)
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  -baseline CA19.9 872 -EUS pancreatic mass biopsy confirmed adenocarcinoma. -He has seen surgeon [name] -We reviewed his case in tumor board, and recommended neoadjuvant chemotherapy.  Will repeat CT with pancreatic cancer protocol after neoadjuvant chemo. -Staging PET scan from 8/26 reviewed, no metastatic disease, there is some mild uptake in the periaortic lymph node, will monitor in the future scan.  I also discussed incidental finding of thyroid nodule, I ordered thyroid ultrasound for evaluation. -He started first cycle of chemotherapy FOLFIRINOX on May 16, 2023. He tolerated first cycle moderately well with fatigue, cold sensitivity and abdominal pain etc, discussed management of side effect. -Due to significant weight loss and fatigue after first cycle chemo, I reduced his oxaliplatin and irinotecan dose from cycle 2. -He is not tolerating chemo well, his CA19.9 has not changed much since he started chemo. I the option of changing treatment to gemcitabine and abraxane.  -Restaging CT scan from July 04, 2023 showed partial response.  Patient would like to proceed with surgery next if possible.

## 2023-08-01 NOTE — Telephone Encounter (Signed)
Pt called stating that Dr. Arita Miss office contacted the pt today to schedule pt's surgery.  Pt is scheduled for surgery on 08/27/2023.  Notified Dr. Mosetta Putt of pt's call and upcoming surgery.

## 2023-08-02 ENCOUNTER — Other Ambulatory Visit: Payer: Self-pay

## 2023-08-02 ENCOUNTER — Ambulatory Visit
Admission: RE | Admit: 2023-08-02 | Discharge: 2023-08-02 | Disposition: A | Discharge status: Home / Self Care | Payer: Medicare Other | Source: Ambulatory Visit | Attending: Hematology | Admitting: Hematology

## 2023-08-02 ENCOUNTER — Other Ambulatory Visit (HOSPITAL_COMMUNITY)
Admission: RE | Admit: 2023-08-02 | Discharge: 2023-08-02 | Disposition: A | Discharge status: Home / Self Care | Payer: Medicare Other | Source: Ambulatory Visit | Attending: Hematology | Admitting: Hematology

## 2023-08-02 DIAGNOSIS — E041 Nontoxic single thyroid nodule: Secondary | ICD-10-CM

## 2023-08-05 LAB — CYTOLOGY - NON PAP

## 2023-08-06 ENCOUNTER — Ambulatory Visit: Payer: Medicare Other | Admitting: Hematology

## 2023-08-06 ENCOUNTER — Other Ambulatory Visit: Payer: Medicare Other

## 2023-08-06 ENCOUNTER — Ambulatory Visit: Payer: Medicare Other

## 2023-08-07 NOTE — Pre-Procedure Instructions (Signed)
Surgical Instructions   Your procedure is scheduled on August 13, 2023. Report to [facility] Main Entrance "A" at 5:30 A.M., then check in with the Admitting office. Any questions or running late day of surgery: call [phone]  Questions prior to your surgery date: call [phone], Monday-Friday, 8am-4pm. If you experience any cold or flu symptoms such as cough, fever, chills, shortness of breath, etc. between now and your scheduled surgery, please notify us at the above number.     Remember:  Do not eat after midnight the night before your surgery   You may drink clear liquids until 4:30 AM the morning of your surgery.   Clear liquids allowed are: Water, Non-Citrus Juices (without pulp), Carbonated Beverages, Clear Tea (no milk, honey, etc.), Black Coffee Only (NO MILK, CREAM OR POWDERED CREAMER of any kind), and Gatorade.  Patient Instructions  The night before surgery:  No food after midnight. ONLY clear liquids after midnight. Drink TWO (2) 12 oz G2's night before surgery.  The day of surgery (if you have diabetes): Drink ONE (1) 12 oz G2 given to you in your pre admission testing appointment by 4:30 AM the morning of surgery. Drink in one sitting. Do not sip.  This drink was given to you during your hospital  pre-op appointment visit.  Nothing else to drink after completing the  12 oz bottle of G2.         If you have questions, please contact your surgeon's office.     Take these medicines the morning of surgery with A SIP OF WATER: fenofibrate (TRICOR)  gabapentin (NEURONTIN)  rosuvastatin (CRESTOR)    May take these medicines IF NEEDED: diazepam (VALIUM)  esomeprazole (NEXIUM)  ondansetron (ZOFRAN)  oxyCODONE-acetaminophen (PERCOCET/ROXICET)    STOP taking your apixaban (ELIQUIS) two days prior to surgery. Your last dose will be November 16th.   Follow your surgeon's instructions on when to stop Asprin.  If no instructions were given by your surgeon  then you will need to call the office to get those instructions.     One week prior to surgery, STOP taking any Aleve, Naproxen, Ibuprofen, Motrin, Advil, Goody's, BC's, all herbal medications, fish oil, and non-prescription vitamins.   WHAT DO I DO ABOUT MY DIABETES MEDICATION?   Do not take metFORMIN (GLUCOPHAGE-XR) the morning of surgery.  Do not take glipiZIDE (GLUCOTROL XL) the evening before surgery or the morning of surgery  STOP taking JARDIANCE three days prior to surgery. Your last dose will be November 15th.  THE NIGHT BEFORE SURGERY/MORNING OF SURGERY, take 10 units of Insulin Glargine [facility]).       HOW TO MANAGE YOUR DIABETES BEFORE AND AFTER SURGERY  Why is it important to control my blood sugar before and after surgery? Improving blood sugar levels before and after surgery helps healing and can limit problems. A way of improving blood sugar control is eating a healthy diet by:  Eating less sugar and carbohydrates  Increasing activity/exercise  Talking with your doctor about reaching your blood sugar goals High blood sugars (greater than 180 mg/dL) can raise your risk of infections and slow your recovery, so you will need to focus on controlling your diabetes during the weeks before surgery. Make sure that the doctor who takes care of your diabetes knows about your planned surgery including the date and location.  How do I manage my blood sugar before surgery? Check your blood sugar at least 4 times a day, starting 2 days before surgery,  to make sure that the level is not too high or low.  Check your blood sugar the morning of your surgery when you wake up and every 2 hours until you get to the Short Stay unit.  If your blood sugar is less than 70 mg/dL, you will need to treat for low blood sugar: Do not take insulin. Treat a low blood sugar (less than 70 mg/dL) with  cup of clear juice (cranberry or apple), 4 glucose tablets, OR glucose gel. Recheck  blood sugar in 15 minutes after treatment (to make sure it is greater than 70 mg/dL). If your blood sugar is not greater than 70 mg/dL on recheck, call [phone] for further instructions. Report your blood sugar to the short stay nurse when you get to Short Stay.  If you are admitted to the hospital after surgery: Your blood sugar will be checked by the staff and you will probably be given insulin after surgery (instead of oral diabetes medicines) to make sure you have good blood sugar levels. The goal for blood sugar control after surgery is 80-180 mg/dL.                      Do NOT Smoke (Tobacco/Vaping) for 24 hours prior to your procedure.  If you use a CPAP at night, you may bring your mask/headgear for your overnight stay.   You will be asked to remove any contacts, glasses, piercing's, hearing aid's, dentures/partials prior to surgery. Please bring cases for these items if needed.    Patients discharged the day of surgery will not be allowed to drive home, and someone needs to stay with them for 24 hours.  SURGICAL WAITING ROOM VISITATION Patients may have no more than 2 support people in the waiting area - these visitors may rotate.   Pre-op nurse will coordinate an appropriate time for 1 ADULT support person, who may not rotate, to accompany patient in pre-op.  Children under the age of 52 must have an adult with them who is not the patient and must remain in the main waiting area with an adult.  If the patient needs to stay at the hospital during part of their recovery, the visitor guidelines for inpatient rooms apply.  Please refer to the [facility] website for the visitor guidelines for any additional information.   If you received a COVID test during your pre-op visit  it is requested that you wear a mask when out in public, stay away from anyone that may not be feeling well and notify your surgeon if you develop symptoms. If you have been in contact with anyone that has  tested positive in the last 10 days please notify you surgeon.      Pre-operative CHG Bathing Instructions   You can play a key role in reducing the risk of infection after surgery. Your skin needs to be as free of germs as possible. You can reduce the number of germs on your skin by washing with CHG (chlorhexidine gluconate) soap before surgery. CHG is an antiseptic soap that kills germs and continues to kill germs even after washing.   DO NOT use if you have an allergy to chlorhexidine/CHG or antibacterial soaps. If your skin becomes reddened or irritated, stop using the CHG and notify one of our RNs at [phone].              TAKE A SHOWER THE NIGHT BEFORE SURGERY AND THE DAY OF SURGERY  Please keep in mind the following:  DO NOT shave, including legs and underarms, 48 hours prior to surgery.   You may shave your face before/day of surgery.  Place clean sheets on your bed the night before surgery Use a clean washcloth (not used since being washed) for each shower. DO NOT sleep with pet's night before surgery.  CHG Shower Instructions:  Wash your face and private area with normal soap. If you choose to wash your hair, wash first with your normal shampoo.  After you use shampoo/soap, rinse your hair and body thoroughly to remove shampoo/soap residue.  Turn the water OFF and apply half the bottle of CHG soap to a CLEAN washcloth.  Apply CHG soap ONLY FROM YOUR NECK DOWN TO YOUR TOES (washing for 3-5 minutes)  DO NOT use CHG soap on face, private areas, open wounds, or sores.  Pay special attention to the area where your surgery is being performed.  If you are having back surgery, having someone wash your back for you may be helpful. Wait 2 minutes after CHG soap is applied, then you may rinse off the CHG soap.  Pat dry with a clean towel  Put on clean pajamas    Additional instructions for the day of surgery: DO NOT APPLY any lotions, deodorants, cologne, or perfumes.   Do not  wear jewelry or makeup Do not wear nail polish, gel polish, artificial nails, or any other type of covering on natural nails (fingers and toes) Do not bring valuables to the hospital. [facility] is not responsible for valuables/personal belongings. Put on clean/comfortable clothes.  Please brush your teeth.  Ask your nurse before applying any prescription medications to the skin.

## 2023-08-08 ENCOUNTER — Other Ambulatory Visit: Payer: Self-pay

## 2023-08-08 ENCOUNTER — Encounter (HOSPITAL_COMMUNITY)
Admission: RE | Admit: 2023-08-08 | Discharge: 2023-08-08 | Disposition: A | Discharge status: Home / Self Care | Payer: Medicare Other | Source: Ambulatory Visit | Attending: General Surgery | Admitting: General Surgery

## 2023-08-08 ENCOUNTER — Encounter (HOSPITAL_COMMUNITY): Payer: Self-pay

## 2023-08-08 VITALS — BP 142/76 | HR 75 | Temp 98.4°F | Resp 18 | Ht 70.0 in | Wt 144.1 lb

## 2023-08-08 DIAGNOSIS — F17200 Nicotine dependence, unspecified, uncomplicated: Secondary | ICD-10-CM | POA: Insufficient documentation

## 2023-08-08 DIAGNOSIS — Z01812 Encounter for preprocedural laboratory examination: Secondary | ICD-10-CM | POA: Insufficient documentation

## 2023-08-08 DIAGNOSIS — Z794 Long term (current) use of insulin: Secondary | ICD-10-CM | POA: Diagnosis not present

## 2023-08-08 DIAGNOSIS — E785 Hyperlipidemia, unspecified: Secondary | ICD-10-CM | POA: Diagnosis not present

## 2023-08-08 DIAGNOSIS — E1151 Type 2 diabetes mellitus with diabetic peripheral angiopathy without gangrene: Secondary | ICD-10-CM | POA: Diagnosis not present

## 2023-08-08 DIAGNOSIS — I2510 Atherosclerotic heart disease of native coronary artery without angina pectoris: Secondary | ICD-10-CM | POA: Insufficient documentation

## 2023-08-08 DIAGNOSIS — Z8673 Personal history of transient ischemic attack (TIA), and cerebral infarction without residual deficits: Secondary | ICD-10-CM | POA: Insufficient documentation

## 2023-08-08 DIAGNOSIS — I10 Essential (primary) hypertension: Secondary | ICD-10-CM | POA: Diagnosis not present

## 2023-08-08 DIAGNOSIS — I252 Old myocardial infarction: Secondary | ICD-10-CM | POA: Insufficient documentation

## 2023-08-08 DIAGNOSIS — C252 Malignant neoplasm of tail of pancreas: Secondary | ICD-10-CM | POA: Insufficient documentation

## 2023-08-08 DIAGNOSIS — I4891 Unspecified atrial fibrillation: Secondary | ICD-10-CM | POA: Insufficient documentation

## 2023-08-08 DIAGNOSIS — Z7901 Long term (current) use of anticoagulants: Secondary | ICD-10-CM | POA: Insufficient documentation

## 2023-08-08 DIAGNOSIS — Z95820 Peripheral vascular angioplasty status with implants and grafts: Secondary | ICD-10-CM | POA: Diagnosis not present

## 2023-08-08 DIAGNOSIS — E119 Type 2 diabetes mellitus without complications: Secondary | ICD-10-CM

## 2023-08-08 HISTORY — DX: Personal history of urinary calculi: Z87.442

## 2023-08-08 HISTORY — DX: Unspecified osteoarthritis, unspecified site: M19.90

## 2023-08-08 HISTORY — DX: Cardiac arrhythmia, unspecified: I49.9

## 2023-08-08 LAB — BASIC METABOLIC PANEL
Anion gap: 7 (ref 5–15)
BUN: 15 mg/dL (ref 8–23)
CO2: 23 mmol/L (ref 22–32)
Calcium: 8.8 mg/dL — ABNORMAL LOW (ref 8.9–10.3)
Chloride: 107 mmol/L (ref 98–111)
Creatinine, Ser: 0.8 mg/dL (ref 0.61–1.24)
GFR, Estimated: >60 mL/min (ref >60)
Glucose, Bld: 97 mg/dL (ref 70–99)
Potassium: 4 mmol/L (ref 3.5–5.1)
Sodium: 137 mmol/L (ref 135–145)

## 2023-08-08 LAB — CBC
HCT: 43.8 % (ref 39.0–52.0)
Hemoglobin: 14.1 g/dL (ref 13.0–17.0)
MCH: 29.3 pg (ref 26.0–34.0)
MCHC: 32.2 g/dL (ref 30.0–36.0)
MCV: 90.9 fL (ref 80.0–100.0)
Platelets: 247 10*3/uL (ref 150–400)
RBC: 4.82 MIL/uL (ref 4.22–5.81)
RDW: 15.2 % (ref 11.5–15.5)
WBC: 8.5 10*3/uL (ref 4.0–10.5)
nRBC: 0 % (ref 0.0–0.2)

## 2023-08-08 LAB — GLUCOSE, CAPILLARY: Glucose-Capillary: 95 mg/dL (ref 70–99)

## 2023-08-08 LAB — HEMOGLOBIN A1C
Hgb A1c MFr Bld: 7.8 % — ABNORMAL HIGH (ref 4.8–5.6)
Mean Plasma Glucose: 177.16 mg/dL

## 2023-08-08 NOTE — Progress Notes (Signed)
PCP - Dr. Johny Blamer Cardiologist - Dr. Kristeen Miss - Last office visit 03/12/2023  PPM/ICD - Denies Device Orders - n/a Rep Notified - n/a  Chest x-ray - 05/14/2023 EKG - 03/12/2023 Stress Test - 06/27/2016 ECHO - 10/31/2016 Cardiac Cath - 07/27/2016  Sleep Study - Denies CPAP - n/a  Pt is DM2. He has a Libre 3 CGM on his left arm. Normal fasting blood sugar 70-90s. The past couple weeks, it will drop to 50-60s around 0500 which pt treats with juice. CBG at pre-op appointment 95. A1c result pending.  Last dose of GLP1 agonist-  n/a GLP1 instructions: n/a  Blood Thinner Instructions: Pt instructed to hold Eliquis for two days. Last dose will be November 16th. Aspirin Instructions: Pts last dose of ASA will be today, November 14th.  ERAS Protcol - Clear liquids until 0430 morning of surgery PRE-SURGERY Ensure or G2- G2 x3 given to pt with instructions  COVID TEST- n/a   Anesthesia review: Yes. Cardiac Clearance. Pt finished chemotherapy in October 2024.   Patient denies shortness of breath, fever, cough and chest pain at PAT appointment. Pt denies any respiratory illness/infection in the last two months.    All instructions explained to the patient, with a verbal understanding of the material. Patient agrees to go over the instructions while at home for a better understanding. Patient also instructed to self quarantine after being tested for COVID-19. The opportunity to ask questions was provided.

## 2023-08-09 NOTE — Progress Notes (Signed)
Anesthesia Chart Review:  72 year old male follows with cardiology for history of HTN, HLD, A-fib s/p ablation 2021, PAD s/p stenting of R SFA and right CEA, CAD s/p inferior MI.  Cath 2017 showed widely patent LAD and diagonal branches with mild nonobstructive disease, continued patency of the stented segment of the distal RCA with mild nonobstructive RCA stenosis, large dominant vessel, CTO of a small left circumflex with right to left collaterals, normal systolic function with LVEF 55 to 60%.  Last seen by Scott Levering, NP on 07/26/2023.  Per note, "Pancreatic surgery by [name]. Chart reviewed as part of pre-operative protocol coverage. According to the RCRI, patient has a 6.6% risk of MACE. Patient reports activity equivalent to 5.07 METS (per DASI). Given past medical history and time since last visit, based on ACC/AHA guidelines, Scott [name] would be at acceptable risk for the planned procedure without further cardiovascular testing. Patient was advised that if he develops new symptoms prior to surgery to contact our office to arrange a follow-up appointment.  he verbalized understanding. Per Pharm D, patient may hold Eliquis for 2 days prior to procedure."  Follows with vascular surgery for history of Right SFA stent placed in May 2018 by Dr. Darrick Penna for RLE claudication and Right CEA for symptomatic disease in March 2019 by Dr. Myra Gianotti. 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  Last seen 01/10/2023, duplex at that time showed right ICA with 40 to 59%, left ICA with 1 to 39% stenosis.  Normal flow in vertebral and subclavian arteries.  ABIs were stable from the year prior.  He was recommended to follow-up in 1 year.   Follows with oncologist Dr. Mosetta Putt for pancreatic cancer.  Last seen 11/24.  Per note, "Patient reports ongoing weakness, taste changes, and intermittent abdominal pain. He is one month post-chemotherapy and awaiting surgery."  Current every day smoker.    IDDM2, A1c 7.8 on  08/08/2023  Patient reports last dose Eliquis 08/10/2023.   Preop labs reviewed, unremarkable.   EKG 03/12/2023: Sinus bradycardia.  Rate 55.  Nonspecific ST and T wave abnormality.   Carotid duplex 01/10/2023: Summary:  Right Carotid: Velocities in the right ICA are consistent with a 40-59% stenosis.  Left Carotid: Velocities in the left ICA are consistent with a 1-39% stenosis. The ECA appears >50% stenosed.  Vertebrals:  Bilateral vertebral arteries demonstrate antegrade flow.  Subclavians: Normal flow hemodynamics were seen in bilateral subclavian arteries.    Cath 07/27/2016: 1. Widely patent LAD and diagonal branches with mild nonobstructive disease 2. Continued patency of the stented segment in the distal RCA with mild nonobstructive RCA stenosis, large dominant vessel 3. 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 Normal LV systolic function with LVEF estimated at 55-60%, normal LVEDP   Recommendations: Continued medical therapy and risk reduction measures. Okay to resume apixaban tonight.     [facility]/Anesthesiology Phone [phone] 08/09/2023 10:59 AM

## 2023-08-09 NOTE — Anesthesia Preprocedure Evaluation (Signed)
Anesthesia Evaluation  Patient identified by MRN, date of birth, ID band Patient awake    Reviewed: Allergy & Precautions, NPO status , Patient's Chart, lab work & pertinent test results  History of Anesthesia Complications Negative for: history of anesthetic complications  Airway Mallampati: I  TM Distance: >3 FB Neck ROM: Full   Comment: Previous grade I view with MAC 4 Dental   Pulmonary neg shortness of breath, neg sleep apnea, neg COPD, neg recent URI, Current Smoker   Pulmonary exam normal breath sounds clear to auscultation       Cardiovascular hypertension (amlodipine, valsartan), Pt. on medications (-) angina + CAD, + Past MI (1996), + Cardiac Stents and + Peripheral Vascular Disease  + dysrhythmias (s/p ablation) Atrial Fibrillation  Rhythm:Regular Rate:Normal  HLD, right carotid artery stenosis  TEE 07/25/2018:  No evidence for left atrial chamber of left atrial appendage thrombus   Most likely no thrombus in the right atrial appendage (see detail below)   Normal left ventricular systolic function, ejection fraction > 55%   Mitral regurgitation - mild   Dilated left atrium   Aortic sclerosis   Normal right ventricular systolic function   Dilated right atrium - mild     Neuro/Psych neg Seizures Chronic pain syndrome  Neuromuscular disease (lumbar stenosis with radiculopathy) CVA (11/2021, patient denies)    GI/Hepatic Neg liver ROS,GERD  Medicated,,Pancreatic cancer   Endo/Other  diabetes (Hgb A1c 7.8), Poorly Controlled, Type 2, Oral Hypoglycemic Agents, Insulin Dependent    Renal/GU negative Renal ROS     Musculoskeletal  (+) Arthritis ,    Abdominal   Peds  Hematology negative hematology ROS (+) Lab Results      Component                Value               Date                      WBC                      8.5                 08/08/2023                HGB                      14.1                 08/08/2023                HCT                      43.8                08/08/2023                MCV                      90.9                08/08/2023                PLT                      247  08/08/2023              Anesthesia Other Findings Last Eliquis: 11/16  Reproductive/Obstetrics                             Anesthesia Physical Anesthesia Plan  ASA: 3  Anesthesia Plan: General   Post-op Pain Management: Tylenol PO (pre-op)*   Induction: Intravenous  PONV Risk Score and Plan: 1 and Ondansetron, Dexamethasone and Treatment may vary due to age or medical condition  Airway Management Planned: Oral ETT  Additional Equipment: Arterial line  Intra-op Plan:   Post-operative Plan: Extubation in OR  Informed Consent: I have reviewed the patients History and Physical, chart, labs and discussed the procedure including the risks, benefits and alternatives for the proposed anesthesia with the patient or authorized representative who has indicated his/her understanding and acceptance.     Dental advisory given  Plan Discussed with: CRNA and Anesthesiologist  Anesthesia Plan Comments: (Plan for 2 large-bore PIVs, arterial line.  PAT note by Antionette Poles, PA-C: 72 year old male follows with cardiology for history of HTN, HLD, A-fib s/p ablation 2021, PAD s/p stenting of R SFA and right CEA, CAD s/p inferior MI.  Cath 2017 showed widely patent LAD and diagonal branches with mild nonobstructive disease, continued patency of the stented segment of the distal RCA with mild nonobstructive RCA stenosis, large dominant vessel, CTO of a small left circumflex with right to left collaterals, normal systolic function with LVEF 55 to 60%.  Last seen by Scott Levering, NP on 07/26/2023.  Per note, "Pancreatic surgery by [name]. Chart reviewed as part of pre-operative protocol coverage. According to the RCRI, patient has a 6.6% risk of MACE.  Patient reports activity equivalent to 5.07 METS (per DASI). Given past medical history and time since last visit, based on ACC/AHA guidelines, Scott [name] would be at acceptable risk for the planned procedure without further cardiovascular testing. Patient was advised that if he develops new symptoms prior to surgery to contact our office to arrange a follow-up appointment.  he verbalized understanding. Per Pharm D, patient may hold Eliquis for 2 days prior to procedure."  Follows with vascular surgery for history of Right SFA stent placed in May 2018 by Dr. Darrick Penna for RLE claudication and Right CEA for symptomatic disease in March 2019 by Dr. Myra Gianotti. 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  Last seen 01/10/2023, duplex at that time showed right ICA with 40 to 59%, left ICA with 1 to 39% stenosis.  Normal flow in vertebral and subclavian arteries.  ABIs were stable from the year prior.  He was recommended to follow-up in 1 year.  Follows with oncologist Dr. Mosetta Putt for pancreatic cancer.  Last seen 11/24.  Per note, "Patient reports ongoing weakness, taste changes, and intermittent abdominal pain. He is one month post-chemotherapy and awaiting surgery."  Current every day smoker.   IDDM2, A1c 7.8 on 08/08/2023  Patient reports last dose Eliquis 08/10/2023.  Preop labs reviewed, unremarkable.  EKG 03/12/2023: Sinus bradycardia.  Rate 55.  Nonspecific ST and T wave abnormality.  Carotid duplex 01/10/2023: Summary:  Right Carotid: Velocities in the right ICA are consistent with a 40-59% stenosis.  Left Carotid: Velocities in the left ICA are consistent with a 1-39% stenosis. The ECA appears >50% stenosed.  Vertebrals: Bilateral vertebral arteries demonstrate antegrade flow.  Subclavians: Normal flow hemodynamics were seen in bilateral subclavian arteries.   Cath 07/27/2016: 1.  Widely patent LAD and diagonal branches with mild nonobstructive disease 2. Continued patency of the stented  segment in the distal RCA with mild nonobstructive RCA stenosis, large dominant vessel 3. 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 Normal LV systolic function with LVEF estimated at 55-60%, normal LVEDP  Recommendations: Continued medical therapy and risk reduction measures. Okay to resume apixaban tonight.  )        Anesthesia Quick Evaluation

## 2023-08-12 NOTE — H&P (Signed)
PROVIDER:  Matthias Hughs, MD Patient Care Team: Johny Blamer (Inactive) as PCP - General   MRN: [id number] DOB: 21-Jul-1951   Plan Chief Complaint: RECHECK (adenocarcinoma of pancreas)       History of Present Illness: Scott [name] is a 72 y.o. male who is seen today for follow up.   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  The patient presented with a prodrome of weight loss and weakness as well as upper abdominal pain and back pain.  The patient says that some of this was hard to tease out as he had a significant back surgery several years back in 2019 and had significant difficulty after that.  He did get better initially after surgery, but then he developed leg weakness and decreased activity.  He got significantly depressed during that time and lost a fair amount of weight in 2020 and 2021.  He seen neurology and did start going back to working out 2-3 times most weeks.  He has been doing weight lifting and resistance type exercises to improve his muscle mass.  This was working until this year.  He describes noticing upper abdominal pain after returning from the beach over 4 July.  It was nagging and uncomfortable and he mentioned it to his wife several times.  When this did not improve, he went to see his primary care doctor.  Dr. Tiburcio Pea ordered a CT scan and found a mass present.  He already was on hydrocodone for chronic back pain.  He has required an increase to oxycodone in order to control his pain.  He has some back pain in addition to the upper abdominal pain.  He has been trying to take a half a pill at a time to decrease the amount of medication that he is taking, but he does end up needing about 3 to 5 pills/day at least 2 be able to manage normal activities.  He has started to have some constipation which is new for him.   He has had some decreased activity over the past week or 2, but before that  his appetite seemed normal to him.   He is diabetic, but developed diabetes and the time around his back surgery.  His A1c went up and he now is requiring some insulin at this point.  He was started on insulin last fall.   Interval history:    Patient has now completed 4 cycles of chemotherapy.  Repeat imaging did not show any evidence of metastatic disease.  He did show some emerging response.  His PET scan initially did not show any metastatic disease either.  CA 19-9 is 743 down from 872.   Patient has had issues with eating during chemotherapy.  He has had issues with his taste buds.  He does still have some chronic abdominal pain and does have to take Percocet multiple times per day.  He generally takes a half a tablet at a time.  He has lost some more weight but then has regained some of this back.  He is trying to do activities at least once or twice a week including using the leaf blower and also moving firewood in.       Review of Systems: A complete review of systems was obtained from the patient.  I have reviewed this information and discussed as appropriate with the patient.  See HPI as well for other ROS.   Review  of Systems  All other systems reviewed and are negative.       Medical History: Past Medical History      Past Medical History:  Diagnosis Date   Diabetes mellitus without complication (CMS/HHS-HCC)     GERD (gastroesophageal reflux disease)     Heart disease      AFib   History of cancer     Hyperlipidemia     Hypertension     Osteoarthritis     Sleep apnea          Problem List     Patient Active Problem List  Diagnosis   Weakness of both lower extremities   Fatigue of lower extremity   Radiculopathy of lumbosacral region   Muscular deconditioning   Primary adenocarcinoma of tail of pancreas (CMS/HHS-HCC)   Chronic atrial fibrillation (CMS/HHS-HCC)   Type 2 diabetes mellitus without complication, without long-term current use of insulin  (CMS/HHS-HCC)   Chronic pain syndrome   On continuous oral anticoagulation   Coronary artery disease involving native coronary artery without angina pectoris   PAD (peripheral artery disease) (CMS-HCC)   Severe protein-calorie malnutrition (CMS/HHS-HCC)        Past Surgical History       Past Surgical History:  Procedure Laterality Date   BACK SURGERY       CHOLECYSTECTOMY       CORONARY ANGIOPLASTY       KNEE ARTHROSCOPY       OPERATIVE TISSUE ABLATION AND RECONSTRUCTION ATRIA PERFORMED WITH OTHER CARDIAC PROCEDURE Bilateral          Allergies      Allergies  Allergen Reactions   Augmentin [Amoxicillin-Pot Clavulanate] Hives and Itching        Medications Ordered Prior to Encounter        Current Outpatient Medications on File Prior to Visit  Medication Sig Dispense Refill   ACCU-CHEK FASTCLIX LANCET DRUM USE  TO CHECK BS  READING ONCE A DAY       ACCU-CHEK GUIDE TEST STRIPS test strip CHECK BLOOD SUGARS ONCE A DAY       amLODIPine (NORVASC) 10 MG tablet Take by mouth       apixaban (ELIQUIS) 5 mg tablet TAKE 1 TABLET(5 MG) BY MOUTH TWICE DAILY       atorvastatin (LIPITOR) 40 MG tablet         carboxymethylce-glycern-poly80 0.5-1-0.5 % Drop Apply to eye       clindamycin (CLEOCIN) 150 MG capsule Take 1 mg by mouth 4 (four) times daily       cyclobenzaprine (FLEXERIL) 10 MG tablet TK 1 T PO TID PRF MSP       diazePAM (VALIUM) 2 MG tablet TAKE 1 TABLET AS NEEDED TWICE A DAY FOR ANXIETY OR MUSCLE SPASM       esomeprazole (NEXIUM) 40 MG DR capsule PRN       fenofibrate 120 mg Tab         fenofibrate nanocrystallized (TRICOR) 48 MG tablet Take by mouth       gabapentin (NEURONTIN) 300 MG capsule 1 tab q hsx5 days,1 tab bidx5days,1 tab bid&2 tabs q hsx5days,2 tabs q am,1 tab q pm&2 tabs q hsx5days, then 2 tabs 3 times a day 180 capsule 2   glipiZIDE (GLUCOTROL XL) 5 MG XL tablet Take by mouth       HYDROcodone-acetaminophen (NORCO) 5-325 mg tablet Take by mouth        losartan (COZAAR) 100 MG tablet Take by  mouth       metFORMIN (GLUCOPHAGE-XR) 500 MG XR tablet         oxyCODONE (ROXICODONE) 5 MG immediate release tablet Take 1-2 tablets (5-10 mg total) by mouth every 6 (six) hours as needed for Pain 20 tablet 0   potassium chloride (KLOR-CON) 10 mEq ER tablet Take by mouth       sildenafiL (VIAGRA) 100 MG tablet Take by mouth       spironolactone (ALDACTONE) 25 MG tablet TAKE 1/2 TO 1 TABLET BY MOUTH EVERY DAY       valsartan (DIOVAN) 320 MG tablet Take 1 mg by mouth once daily        No current facility-administered medications on file prior to visit.        Family History       Family History  Problem Relation Age of Onset   Diabetes Mother     Diabetes type II Mother     High blood pressure (Hypertension) Mother     Hyperlipidemia (Elevated cholesterol) Mother     Kidney disease Mother     Skin cancer Father     Coronary Artery Disease (Blocked arteries around heart) Father     Heart disease Father     High blood pressure (Hypertension) Father     Hyperlipidemia (Elevated cholesterol) Father          Tobacco Use History  Social History        Tobacco Use  Smoking Status Every Day   Current packs/day: 0.50   Average packs/day: 0.5 packs/day for 52.4 years (26.2 ttl pk-yrs)   Types: Cigarettes   Start date: 03/04/1971  Smokeless Tobacco Never  Tobacco Comments    Trying to quit - have reduced...        Social History  Social History         Socioeconomic History   Marital status: Married  Tobacco Use   Smoking status: Every Day      Current packs/day: 0.50      Average packs/day: 0.5 packs/day for 52.4 years (26.2 ttl pk-yrs)      Types: Cigarettes      Start date: 03/04/1971   Smokeless tobacco: Never   Tobacco comments:      Trying to quit - have reduced...  Vaping Use   Vaping status: Never Used  Substance and Sexual Activity   Alcohol use: Not Currently      Comment: Rare glass of wine or a beer... maybe 10-15 in  a year.   Drug use: Not Currently      Comment: Marijuana in college, younger days   Sexual activity: Yes      Partners: Female      Birth control/protection: None      Comment: Wife is 22, so...        Objective:         Vitals:      BP: (!) 152/77  Pulse: 80  Temp: 36.7 C (98.1 F)  SpO2: 99%  Weight: 63.1 kg (139 lb 3.2 oz)  Height: 177.8 cm (5\' 10" )  PainSc:   5    Body mass index is 19.97 kg/m.   Head:   Normocephalic and atraumatic.  Eyes:    Conjunctivae are normal. Pupils are equal, round, and reactive to light. No scleral icterus.  Resp:   No respiratory distress, normal effort Neurological: Alert and oriented to person, place, and time. Coordination normal.  Skin:    Skin is warm  and dry. No rash noted. No diaphoretic. No erythema. No pallor.  Psychiatric: Normal mood and affect. Normal behavior. Judgment and thought content normal.      Labs, Imaging and Diagnostic Testing:   10/2 albumin 3.8, cbc, remainder of CMET essentially normal.     Assessment and Plan:    Assessment Diagnoses and all orders for this visit:   Primary adenocarcinoma of tail of pancreas (CMS/HHS-HCC)   Chronic atrial fibrillation (CMS/HHS-HCC)   PAD (peripheral artery disease) (CMS-HCC)   Coronary artery disease involving native coronary artery of native heart without angina pectoris   Type 2 diabetes mellitus without complication, without long-term current use of insulin (CMS/HHS-HCC)   Severe protein-calorie malnutrition (CMS/HHS-HCC)   On continuous oral anticoagulation       Patient does appear to have had a chemotherapy response and we will plan to proceed with surgery.  I will plan diagnostic laparoscopy followed by hand-assisted laparoscopic distal pancreatectomy and splenectomy.  I did discuss the potential for open.  I reviewed risk of finding metastatic disease and aborting surgery.  I discussed postoperative risk including bleeding, infection, damage to adjacent  structures, heart or lung complications, pancreatic leak, possible need for additional surgeries or procedures, possible blood clot.  I reviewed postoperative lifting restrictions and time in the hospital.   I discussed that if he has a pancreatic leak that there is certainly a possibility of getting very ill or dying.  I discussed that he may go home with the drain from the hospital.  I reviewed risk of pancreatic insufficiency.   We will see if Dr. Blake Divine can give the splenectomy vaccines.   We have sent request for cardiology restratification and Plavix holding instructions to his cardiologist Dr. Elease Hashimoto.   No follow-ups on file.

## 2023-08-13 ENCOUNTER — Encounter (HOSPITAL_COMMUNITY)
Admission: RE | Disposition: A | Discharge status: Home Health | Payer: Self-pay | Source: Ambulatory Visit | Attending: General Surgery

## 2023-08-13 ENCOUNTER — Inpatient Hospital Stay (HOSPITAL_COMMUNITY)
Admission: RE | Admit: 2023-08-13 | Discharge: 2023-08-19 | DRG: 405 | Disposition: A | Discharge status: Home Health | Payer: Medicare Other | Source: Ambulatory Visit | Attending: General Surgery | Admitting: General Surgery

## 2023-08-13 ENCOUNTER — Inpatient Hospital Stay (HOSPITAL_COMMUNITY): Payer: Medicare Other | Admitting: Physician Assistant

## 2023-08-13 ENCOUNTER — Encounter (HOSPITAL_COMMUNITY): Payer: Self-pay | Admitting: General Surgery

## 2023-08-13 ENCOUNTER — Ambulatory Visit: Payer: Medicare Other

## 2023-08-13 ENCOUNTER — Inpatient Hospital Stay (HOSPITAL_COMMUNITY): Payer: Medicare Other | Admitting: Registered Nurse

## 2023-08-13 ENCOUNTER — Other Ambulatory Visit: Payer: Self-pay

## 2023-08-13 ENCOUNTER — Ambulatory Visit: Payer: Medicare Other | Admitting: Hematology

## 2023-08-13 ENCOUNTER — Other Ambulatory Visit: Payer: Medicare Other

## 2023-08-13 DIAGNOSIS — E119 Type 2 diabetes mellitus without complications: Secondary | ICD-10-CM

## 2023-08-13 DIAGNOSIS — I4819 Other persistent atrial fibrillation: Secondary | ICD-10-CM | POA: Diagnosis present

## 2023-08-13 DIAGNOSIS — K5900 Constipation, unspecified: Secondary | ICD-10-CM | POA: Diagnosis present

## 2023-08-13 DIAGNOSIS — C259 Malignant neoplasm of pancreas, unspecified: Secondary | ICD-10-CM

## 2023-08-13 DIAGNOSIS — I6521 Occlusion and stenosis of right carotid artery: Secondary | ICD-10-CM | POA: Diagnosis present

## 2023-08-13 DIAGNOSIS — Z9049 Acquired absence of other specified parts of digestive tract: Secondary | ICD-10-CM | POA: Diagnosis not present

## 2023-08-13 DIAGNOSIS — F17210 Nicotine dependence, cigarettes, uncomplicated: Secondary | ICD-10-CM | POA: Diagnosis present

## 2023-08-13 DIAGNOSIS — G4730 Sleep apnea, unspecified: Secondary | ICD-10-CM | POA: Diagnosis present

## 2023-08-13 DIAGNOSIS — G894 Chronic pain syndrome: Secondary | ICD-10-CM | POA: Diagnosis present

## 2023-08-13 DIAGNOSIS — E1165 Type 2 diabetes mellitus with hyperglycemia: Secondary | ICD-10-CM | POA: Diagnosis present

## 2023-08-13 DIAGNOSIS — Z87442 Personal history of urinary calculi: Secondary | ICD-10-CM

## 2023-08-13 DIAGNOSIS — I679 Cerebrovascular disease, unspecified: Secondary | ICD-10-CM | POA: Diagnosis present

## 2023-08-13 DIAGNOSIS — Z681 Body mass index (BMI) 19 or less, adult: Secondary | ICD-10-CM

## 2023-08-13 DIAGNOSIS — M1990 Unspecified osteoarthritis, unspecified site: Secondary | ICD-10-CM | POA: Diagnosis present

## 2023-08-13 DIAGNOSIS — I10 Essential (primary) hypertension: Secondary | ICD-10-CM | POA: Diagnosis present

## 2023-08-13 DIAGNOSIS — K4090 Unilateral inguinal hernia, without obstruction or gangrene, not specified as recurrent: Secondary | ICD-10-CM | POA: Diagnosis present

## 2023-08-13 DIAGNOSIS — Z833 Family history of diabetes mellitus: Secondary | ICD-10-CM

## 2023-08-13 DIAGNOSIS — E43 Unspecified severe protein-calorie malnutrition: Secondary | ICD-10-CM | POA: Diagnosis present

## 2023-08-13 DIAGNOSIS — C252 Malignant neoplasm of tail of pancreas: Secondary | ICD-10-CM | POA: Diagnosis present

## 2023-08-13 DIAGNOSIS — Z8261 Family history of arthritis: Secondary | ICD-10-CM

## 2023-08-13 DIAGNOSIS — Z9861 Coronary angioplasty status: Secondary | ICD-10-CM

## 2023-08-13 DIAGNOSIS — I252 Old myocardial infarction: Secondary | ICD-10-CM

## 2023-08-13 DIAGNOSIS — E11649 Type 2 diabetes mellitus with hypoglycemia without coma: Secondary | ICD-10-CM | POA: Diagnosis not present

## 2023-08-13 DIAGNOSIS — Z808 Family history of malignant neoplasm of other organs or systems: Secondary | ICD-10-CM

## 2023-08-13 DIAGNOSIS — I2510 Atherosclerotic heart disease of native coronary artery without angina pectoris: Secondary | ICD-10-CM | POA: Diagnosis present

## 2023-08-13 DIAGNOSIS — Z794 Long term (current) use of insulin: Secondary | ICD-10-CM | POA: Diagnosis not present

## 2023-08-13 DIAGNOSIS — Z7982 Long term (current) use of aspirin: Secondary | ICD-10-CM

## 2023-08-13 DIAGNOSIS — E785 Hyperlipidemia, unspecified: Secondary | ICD-10-CM | POA: Diagnosis present

## 2023-08-13 DIAGNOSIS — M5417 Radiculopathy, lumbosacral region: Secondary | ICD-10-CM | POA: Diagnosis present

## 2023-08-13 DIAGNOSIS — Z7984 Long term (current) use of oral hypoglycemic drugs: Secondary | ICD-10-CM

## 2023-08-13 DIAGNOSIS — C787 Secondary malignant neoplasm of liver and intrahepatic bile duct: Secondary | ICD-10-CM | POA: Diagnosis present

## 2023-08-13 DIAGNOSIS — Z8249 Family history of ischemic heart disease and other diseases of the circulatory system: Secondary | ICD-10-CM

## 2023-08-13 DIAGNOSIS — Z9221 Personal history of antineoplastic chemotherapy: Secondary | ICD-10-CM

## 2023-08-13 DIAGNOSIS — Z880 Allergy status to penicillin: Secondary | ICD-10-CM

## 2023-08-13 DIAGNOSIS — E1151 Type 2 diabetes mellitus with diabetic peripheral angiopathy without gangrene: Secondary | ICD-10-CM | POA: Diagnosis present

## 2023-08-13 DIAGNOSIS — Z79899 Other long term (current) drug therapy: Secondary | ICD-10-CM

## 2023-08-13 DIAGNOSIS — K219 Gastro-esophageal reflux disease without esophagitis: Secondary | ICD-10-CM | POA: Diagnosis present

## 2023-08-13 DIAGNOSIS — Z7901 Long term (current) use of anticoagulants: Secondary | ICD-10-CM

## 2023-08-13 DIAGNOSIS — Z8043 Family history of malignant neoplasm of testis: Secondary | ICD-10-CM

## 2023-08-13 DIAGNOSIS — C251 Malignant neoplasm of body of pancreas: Principal | ICD-10-CM | POA: Diagnosis present

## 2023-08-13 DIAGNOSIS — Z8673 Personal history of transient ischemic attack (TIA), and cerebral infarction without residual deficits: Secondary | ICD-10-CM

## 2023-08-13 DIAGNOSIS — Z841 Family history of disorders of kidney and ureter: Secondary | ICD-10-CM

## 2023-08-13 HISTORY — PX: LAPAROSCOPY: SHX197

## 2023-08-13 HISTORY — PX: PARTIAL GASTRECTOMY: SHX6003

## 2023-08-13 HISTORY — PX: BOWEL RESECTION: SHX1257

## 2023-08-13 HISTORY — PX: XI ROBOTIC ASSISTED LAPAROSCOPIC DISTAL PANCREATECTOMY: SHX6539

## 2023-08-13 LAB — CREATININE, SERUM
Creatinine, Ser: 0.75 mg/dL (ref 0.61–1.24)
GFR, Estimated: >60 mL/min (ref >60)

## 2023-08-13 LAB — CBC
HCT: 35.9 % — ABNORMAL LOW (ref 39.0–52.0)
Hemoglobin: 11.7 g/dL — ABNORMAL LOW (ref 13.0–17.0)
MCH: 29.5 pg (ref 26.0–34.0)
MCHC: 32.6 g/dL (ref 30.0–36.0)
MCV: 90.7 fL (ref 80.0–100.0)
Platelets: 224 10*3/uL (ref 150–400)
RBC: 3.96 MIL/uL — ABNORMAL LOW (ref 4.22–5.81)
RDW: 15.2 % (ref 11.5–15.5)
WBC: 14.2 10*3/uL — ABNORMAL HIGH (ref 4.0–10.5)
nRBC: 0 % (ref 0.0–0.2)

## 2023-08-13 LAB — GLUCOSE, CAPILLARY
Glucose-Capillary: 165 mg/dL — ABNORMAL HIGH (ref 70–99)
Glucose-Capillary: 228 mg/dL — ABNORMAL HIGH (ref 70–99)
Glucose-Capillary: 214 mg/dL — ABNORMAL HIGH (ref 70–99)
Glucose-Capillary: 162 mg/dL — ABNORMAL HIGH (ref 70–99)

## 2023-08-13 LAB — PREPARE RBC (CROSSMATCH)

## 2023-08-13 SURGERY — LAPAROSCOPY, DIAGNOSTIC
Anesthesia: General | Site: Abdomen

## 2023-08-13 MED ORDER — ENOXAPARIN SODIUM 40 MG/0.4ML IJ SOSY
40.0000 mg | PREFILLED_SYRINGE | INTRAMUSCULAR | Status: DC
  Administered 2023-08-14: 40 mg via SUBCUTANEOUS
  Filled 2023-08-13: qty 0.4

## 2023-08-13 MED ORDER — PANTOPRAZOLE SODIUM 40 MG PO TBEC: 40.0000 mg | DELAYED_RELEASE_TABLET | Freq: Every day | ORAL | Status: DC

## 2023-08-13 MED ORDER — BUPIVACAINE-EPINEPHRINE (PF) 0.25% -1:200000 IJ SOLN
INTRAMUSCULAR | Status: AC
  Filled 2023-08-13: qty 30

## 2023-08-13 MED ORDER — ROCURONIUM BROMIDE 10 MG/ML (PF) SYRINGE
PREFILLED_SYRINGE | INTRAVENOUS | Status: AC
  Filled 2023-08-13: qty 10

## 2023-08-13 MED ORDER — DEXMEDETOMIDINE HCL IN NACL 80 MCG/20ML IV SOLN
INTRAVENOUS | Status: DC | PRN
  Administered 2023-08-13: 12 ug via INTRAVENOUS
  Administered 2023-08-13: 8 ug via INTRAVENOUS

## 2023-08-13 MED ORDER — ENSURE PRE-SURGERY PO LIQD
592.0000 mL | Freq: Once | ORAL | Status: DC
  Administered 2023-08-13: 592 mL via ORAL

## 2023-08-13 MED ORDER — GABAPENTIN 300 MG PO CAPS
900.0000 mg | ORAL_CAPSULE | Freq: Two times a day (BID) | ORAL | Status: DC
  Administered 2023-08-14 – 2023-08-19 (×11): 900 mg via ORAL
  Filled 2023-08-13 (×11): qty 3

## 2023-08-13 MED ORDER — ACETAMINOPHEN 500 MG PO TABS
1000.0000 mg | ORAL_TABLET | Freq: Four times a day (QID) | ORAL | Status: DC
  Administered 2023-08-13 – 2023-08-19 (×19): 1000 mg via ORAL
  Filled 2023-08-13 (×23): qty 2

## 2023-08-13 MED ORDER — MELATONIN 3 MG PO TABS
3.0000 mg | ORAL_TABLET | Freq: Every evening | ORAL | Status: DC | PRN
  Administered 2023-08-14 – 2023-08-18 (×2): 3 mg via ORAL
  Filled 2023-08-13 (×2): qty 1

## 2023-08-13 MED ORDER — KETAMINE HCL 10 MG/ML IJ SOLN
INTRAMUSCULAR | Status: DC | PRN
  Administered 2023-08-13: 10 mg via INTRAVENOUS
  Administered 2023-08-13 (×2): 20 mg via INTRAVENOUS

## 2023-08-13 MED ORDER — CIPROFLOXACIN IN D5W 400 MG/200ML IV SOLN
400.0000 mg | INTRAVENOUS | Status: AC
  Administered 2023-08-13: 400 mg via INTRAVENOUS
  Filled 2023-08-13: qty 200

## 2023-08-13 MED ORDER — POLYETHYLENE GLYCOL 3350 17 G PO PACK: 17.0000 g | PACK | Freq: Every day | ORAL | Status: DC

## 2023-08-13 MED ORDER — ORAL CARE MOUTH RINSE: 15.0000 mL | Freq: Once | OROMUCOSAL | Status: AC

## 2023-08-13 MED ORDER — ACETAMINOPHEN 10 MG/ML IV SOLN
INTRAVENOUS | Status: AC
  Filled 2023-08-13: qty 100

## 2023-08-13 MED ORDER — PROCHLORPERAZINE MALEATE 10 MG PO TABS: 10.0000 mg | ORAL_TABLET | Freq: Four times a day (QID) | ORAL | Status: DC | PRN

## 2023-08-13 MED ORDER — STERILE WATER FOR IRRIGATION IR SOLN
Status: DC | PRN
  Administered 2023-08-13: 1000 mL

## 2023-08-13 MED ORDER — FENTANYL CITRATE (PF) 250 MCG/5ML IJ SOLN
INTRAMUSCULAR | Status: AC
  Filled 2023-08-13: qty 5

## 2023-08-13 MED ORDER — PROCHLORPERAZINE EDISYLATE 10 MG/2ML IJ SOLN: 5.0000 mg | Freq: Four times a day (QID) | INTRAMUSCULAR | Status: DC | PRN

## 2023-08-13 MED ORDER — METHOCARBAMOL 1000 MG/10ML IJ SOLN
750.0000 mg | Freq: Three times a day (TID) | INTRAMUSCULAR | Status: DC | PRN
  Administered 2023-08-13 – 2023-08-14 (×2): 750 mg via INTRAVENOUS
  Filled 2023-08-13 (×2): qty 10

## 2023-08-13 MED ORDER — LABETALOL HCL 5 MG/ML IV SOLN
INTRAVENOUS | Status: DC | PRN
  Administered 2023-08-13 (×3): 5 mg via INTRAVENOUS

## 2023-08-13 MED ORDER — KETAMINE HCL 50 MG/5ML IJ SOSY
PREFILLED_SYRINGE | INTRAMUSCULAR | Status: AC
  Filled 2023-08-13: qty 5

## 2023-08-13 MED ORDER — NICOTINE 14 MG/24HR TD PT24
14.0000 mg | MEDICATED_PATCH | Freq: Every day | TRANSDERMAL | Status: DC
  Administered 2023-08-13 – 2023-08-16 (×4): 14 mg via TRANSDERMAL
  Filled 2023-08-13 (×6): qty 1

## 2023-08-13 MED ORDER — AMLODIPINE BESYLATE 5 MG PO TABS
5.0000 mg | ORAL_TABLET | Freq: Every day | ORAL | Status: DC
  Administered 2023-08-14 – 2023-08-18 (×5): 5 mg via ORAL
  Filled 2023-08-13 (×5): qty 1

## 2023-08-13 MED ORDER — ONDANSETRON HCL 4 MG/2ML IJ SOLN
INTRAMUSCULAR | Status: DC | PRN
  Administered 2023-08-13: 4 mg via INTRAVENOUS

## 2023-08-13 MED ORDER — INSULIN ASPART 100 UNIT/ML IJ SOLN
0.0000 [IU] | Freq: Three times a day (TID) | INTRAMUSCULAR | Status: DC
Start: 2023-08-13 — End: 2023-08-13

## 2023-08-13 MED ORDER — FENTANYL CITRATE (PF) 250 MCG/5ML IJ SOLN
INTRAMUSCULAR | Status: DC | PRN
  Administered 2023-08-13 (×5): 50 ug via INTRAVENOUS

## 2023-08-13 MED ORDER — 0.9 % SODIUM CHLORIDE (POUR BTL) OPTIME
TOPICAL | Status: DC | PRN
  Administered 2023-08-13: 2000 mL

## 2023-08-13 MED ORDER — HYDROMORPHONE HCL 1 MG/ML IJ SOLN
INTRAMUSCULAR | Status: AC
  Filled 2023-08-13: qty 0.5

## 2023-08-13 MED ORDER — KCL IN DEXTROSE-NACL 20-5-0.45 MEQ/L-%-% IV SOLN
INTRAVENOUS | Status: DC
Start: 2023-08-13 — End: 2023-08-14
  Filled 2023-08-13 (×2): qty 1000

## 2023-08-13 MED ORDER — ROCURONIUM BROMIDE 10 MG/ML (PF) SYRINGE
PREFILLED_SYRINGE | INTRAVENOUS | Status: DC | PRN
  Administered 2023-08-13: 60 mg via INTRAVENOUS
  Administered 2023-08-13: 20 mg via INTRAVENOUS
  Administered 2023-08-13: 40 mg via INTRAVENOUS
  Administered 2023-08-13: 20 mg via INTRAVENOUS

## 2023-08-13 MED ORDER — PROPOFOL 10 MG/ML IV BOLUS
INTRAVENOUS | Status: DC | PRN
  Administered 2023-08-13: 150 mg via INTRAVENOUS

## 2023-08-13 MED ORDER — ONDANSETRON HCL 4 MG PO TABS: 4.0000 mg | ORAL_TABLET | Freq: Three times a day (TID) | ORAL | Status: DC | PRN

## 2023-08-13 MED ORDER — HYDROMORPHONE 1 MG/ML IV SOLN
INTRAVENOUS | Status: DC
  Filled 2023-08-13: qty 30

## 2023-08-13 MED ORDER — VISTASEAL 10 ML SINGLE DOSE KIT
PACK | CUTANEOUS | Status: DC | PRN
  Administered 2023-08-13: 10 mL via TOPICAL

## 2023-08-13 MED ORDER — FENTANYL CITRATE (PF) 100 MCG/2ML IJ SOLN
INTRAMUSCULAR | Status: AC
  Filled 2023-08-13: qty 2

## 2023-08-13 MED ORDER — CHLORHEXIDINE GLUCONATE CLOTH 2 % EX PADS
6.0000 | MEDICATED_PAD | Freq: Once | CUTANEOUS | Status: DC
  Administered 2023-08-13: 6 via TOPICAL

## 2023-08-13 MED ORDER — SODIUM CHLORIDE 0.9% FLUSH: 9.0000 mL | INTRAVENOUS | Status: DC | PRN

## 2023-08-13 MED ORDER — HYDROMORPHONE HCL 1 MG/ML IJ SOLN: 0.2500 mg | INTRAMUSCULAR | Status: DC | PRN

## 2023-08-13 MED ORDER — HYDROMORPHONE HCL 1 MG/ML IJ SOLN
INTRAMUSCULAR | Status: DC | PRN
  Administered 2023-08-13: .1 mg via INTRAVENOUS
  Administered 2023-08-13 (×2): .5 mg via INTRAVENOUS
  Administered 2023-08-13 (×2): .2 mg via INTRAVENOUS

## 2023-08-13 MED ORDER — SPIRONOLACTONE 25 MG PO TABS
25.0000 mg | ORAL_TABLET | Freq: Every day | ORAL | Status: DC
  Administered 2023-08-13 – 2023-08-19 (×7): 25 mg via ORAL
  Filled 2023-08-13 (×7): qty 1

## 2023-08-13 MED ORDER — INSULIN ASPART 100 UNIT/ML IJ SOLN
0.0000 [IU] | INTRAMUSCULAR | Status: DC
  Administered 2023-08-13: 5 [IU] via SUBCUTANEOUS
  Administered 2023-08-14: 3 [IU] via SUBCUTANEOUS
  Administered 2023-08-14: 2 [IU] via SUBCUTANEOUS
  Administered 2023-08-14: 3 [IU] via SUBCUTANEOUS
  Administered 2023-08-14: 2 [IU] via SUBCUTANEOUS
  Administered 2023-08-14: 3 [IU] via SUBCUTANEOUS
  Administered 2023-08-14: 2 [IU] via SUBCUTANEOUS
  Administered 2023-08-14: 3 [IU] via SUBCUTANEOUS
  Administered 2023-08-15 (×3): 2 [IU] via SUBCUTANEOUS
  Administered 2023-08-15: 5 [IU] via SUBCUTANEOUS
  Administered 2023-08-15: 8 [IU] via SUBCUTANEOUS
  Administered 2023-08-15 – 2023-08-16 (×4): 3 [IU] via SUBCUTANEOUS
  Administered 2023-08-16: 2 [IU] via SUBCUTANEOUS
  Administered 2023-08-16: 3 [IU] via SUBCUTANEOUS
  Administered 2023-08-17: 8 [IU] via SUBCUTANEOUS
  Administered 2023-08-17: 2 [IU] via SUBCUTANEOUS
  Administered 2023-08-17: 3 [IU] via SUBCUTANEOUS

## 2023-08-13 MED ORDER — LIDOCAINE HCL 1 % IJ SOLN
INTRAMUSCULAR | Status: DC | PRN
  Administered 2023-08-13: 36 mL via INTRAMUSCULAR

## 2023-08-13 MED ORDER — LACTATED RINGERS IV SOLN: INTRAVENOUS | Status: DC

## 2023-08-13 MED ORDER — ONDANSETRON HCL 4 MG/2ML IJ SOLN: 4.0000 mg | Freq: Four times a day (QID) | INTRAMUSCULAR | Status: DC | PRN

## 2023-08-13 MED ORDER — DIAZEPAM 2 MG PO TABS
2.0000 mg | ORAL_TABLET | Freq: Two times a day (BID) | ORAL | Status: DC | PRN
  Administered 2023-08-13 – 2023-08-19 (×3): 2 mg via ORAL
  Filled 2023-08-13 (×3): qty 1

## 2023-08-13 MED ORDER — ALBUMIN HUMAN 5 % IV SOLN: INTRAVENOUS | Status: DC | PRN

## 2023-08-13 MED ORDER — ENSURE PRE-SURGERY PO LIQD
296.0000 mL | Freq: Once | ORAL | Status: AC
  Administered 2023-08-13: 296 mL via ORAL

## 2023-08-13 MED ORDER — SENNA 8.6 MG PO TABS
1.0000 | ORAL_TABLET | Freq: Two times a day (BID) | ORAL | Status: DC
  Administered 2023-08-14 – 2023-08-19 (×11): 8.6 mg via ORAL
  Filled 2023-08-13 (×11): qty 1

## 2023-08-13 MED ORDER — DIPHENHYDRAMINE HCL 12.5 MG/5ML PO ELIX
12.5000 mg | ORAL_SOLUTION | Freq: Four times a day (QID) | ORAL | Status: DC | PRN
Start: 2023-08-13 — End: 2023-08-14

## 2023-08-13 MED ORDER — DIPHENHYDRAMINE HCL 50 MG/ML IJ SOLN: 12.5000 mg | Freq: Four times a day (QID) | INTRAMUSCULAR | Status: DC | PRN

## 2023-08-13 MED ORDER — DIPHENHYDRAMINE HCL 12.5 MG/5ML PO ELIX: 12.5000 mg | ORAL_SOLUTION | Freq: Four times a day (QID) | ORAL | Status: DC | PRN

## 2023-08-13 MED ORDER — NALOXONE HCL 0.4 MG/ML IJ SOLN: 0.4000 mg | INTRAMUSCULAR | Status: DC | PRN

## 2023-08-13 MED ORDER — PROPOFOL 10 MG/ML IV BOLUS
INTRAVENOUS | Status: AC
Start: 2023-08-13 — End: ?
  Filled 2023-08-13: qty 20

## 2023-08-13 MED ORDER — CIPROFLOXACIN IN D5W 400 MG/200ML IV SOLN
400.0000 mg | Freq: Two times a day (BID) | INTRAVENOUS | Status: AC
  Administered 2023-08-13: 400 mg via INTRAVENOUS
  Filled 2023-08-13: qty 200

## 2023-08-13 MED ORDER — LIDOCAINE HCL 1 % IJ SOLN
INTRAMUSCULAR | Status: AC
  Filled 2023-08-13: qty 20

## 2023-08-13 MED ORDER — ACETAMINOPHEN 10 MG/ML IV SOLN
1000.0000 mg | Freq: Once | INTRAVENOUS | Status: DC | PRN
Start: 2023-08-13 — End: 2023-08-13
  Administered 2023-08-13: 1000 mg via INTRAVENOUS

## 2023-08-13 MED ORDER — CHLORHEXIDINE GLUCONATE 0.12 % MT SOLN
15.0000 mL | Freq: Once | OROMUCOSAL | Status: AC
  Administered 2023-08-13: 15 mL via OROMUCOSAL
  Filled 2023-08-13: qty 15

## 2023-08-13 MED ORDER — OXYCODONE HCL 5 MG/5ML PO SOLN: 5.0000 mg | Freq: Once | ORAL | Status: DC | PRN

## 2023-08-13 MED ORDER — AMISULPRIDE (ANTIEMETIC) 5 MG/2ML IV SOLN: 10.0000 mg | Freq: Once | INTRAVENOUS | Status: DC | PRN

## 2023-08-13 MED ORDER — MELATONIN 5 MG PO TABS
5.0000 mg | ORAL_TABLET | Freq: Once | ORAL | Status: DC
  Filled 2023-08-13: qty 1

## 2023-08-13 MED ORDER — SUGAMMADEX SODIUM 200 MG/2ML IV SOLN
INTRAVENOUS | Status: DC | PRN
  Administered 2023-08-13: 200 mg via INTRAVENOUS

## 2023-08-13 MED ORDER — INSULIN ASPART 100 UNIT/ML IJ SOLN
3.0000 [IU] | Freq: Once | INTRAMUSCULAR | Status: AC
  Administered 2023-08-13: 3 [IU] via SUBCUTANEOUS

## 2023-08-13 MED ORDER — OXYCODONE HCL 5 MG PO TABS: 5.0000 mg | ORAL_TABLET | Freq: Once | ORAL | Status: DC | PRN

## 2023-08-13 MED ORDER — MORPHINE SULFATE 1 MG/ML IV SOLN PCA
INTRAVENOUS | Status: DC
Start: 2023-08-13 — End: 2023-08-13
  Filled 2023-08-13 (×2): qty 30

## 2023-08-13 MED ORDER — HYDROMORPHONE HCL 1 MG/ML IJ SOLN
1.0000 mg | Freq: Once | INTRAMUSCULAR | Status: AC
  Administered 2023-08-13: 1 mg via INTRAVENOUS
  Filled 2023-08-13: qty 1

## 2023-08-13 MED ORDER — LIDOCAINE 2% (20 MG/ML) 5 ML SYRINGE
INTRAMUSCULAR | Status: DC | PRN
  Administered 2023-08-13: 100 mg via INTRAVENOUS

## 2023-08-13 MED ORDER — IRBESARTAN 300 MG PO TABS
300.0000 mg | ORAL_TABLET | Freq: Every day | ORAL | Status: DC
  Administered 2023-08-13 – 2023-08-19 (×7): 300 mg via ORAL
  Filled 2023-08-13 (×7): qty 1

## 2023-08-13 MED ORDER — EPHEDRINE SULFATE-NACL 50-0.9 MG/10ML-% IV SOSY
PREFILLED_SYRINGE | INTRAVENOUS | Status: DC | PRN
  Administered 2023-08-13: 10 mg via INTRAVENOUS

## 2023-08-13 MED ORDER — ACETAMINOPHEN 500 MG PO TABS
1000.0000 mg | ORAL_TABLET | ORAL | Status: AC
  Administered 2023-08-13: 1000 mg via ORAL
  Filled 2023-08-13: qty 2

## 2023-08-13 MED ORDER — FENTANYL CITRATE (PF) 100 MCG/2ML IJ SOLN
25.0000 ug | INTRAMUSCULAR | Status: DC | PRN
  Administered 2023-08-13 (×3): 50 ug via INTRAVENOUS

## 2023-08-13 MED ORDER — PHENYLEPHRINE HCL-NACL 20-0.9 MG/250ML-% IV SOLN
INTRAVENOUS | Status: DC | PRN
  Administered 2023-08-13: 50 ug/min via INTRAVENOUS
  Administered 2023-08-13: 80 ug via INTRAVENOUS

## 2023-08-13 SURGICAL SUPPLY — 117 items
APPLIER CLIP 5 13 M/L LIGAMAX5 (MISCELLANEOUS) ×2 IMPLANT
BAG BILE T-TUBES STRL (MISCELLANEOUS) IMPLANT
BAG COUNTER SPONGE SURGICOUNT (BAG) ×1 IMPLANT
BLADE CLIPPER SURG (BLADE) ×1 IMPLANT
BLADE KNIFE  10 PERSONNA (BLADE) ×1 IMPLANT
CANISTER SUCT 3000ML PPV (MISCELLANEOUS) ×2 IMPLANT
CATH KIT ON Q 5IN DUAL SLV (PAIN MANAGEMENT) IMPLANT
CATH KIT ON-Q SILVERSOAK 5 (CATHETERS) ×1 IMPLANT
CATH KIT ON-Q SILVERSOAK 5IN (CATHETERS) IMPLANT
CATH KIT ON-Q SILVERSOAK 7.5 (CATHETERS) IMPLANT
CATH KIT ON-Q SILVERSOAK 7.5IN (CATHETERS) IMPLANT
CATH ROBINSON RED A/P 14FR (CATHETERS) IMPLANT
CATH ROBINSON RED A/P 16FR (CATHETERS) IMPLANT
CHLORAPREP W/TINT 26 (MISCELLANEOUS) ×2 IMPLANT
CLIP APPLIE 5 13 M/L LIGAMAX5 (MISCELLANEOUS) ×1 IMPLANT
CLIP LIGATING HEM O LOK PURPLE (MISCELLANEOUS) ×2 IMPLANT
CLIP LIGATING HEMO O LOK GREEN (MISCELLANEOUS) ×1 IMPLANT
CLIP LIGATING HEMOLOK MED (MISCELLANEOUS) ×2 IMPLANT
CLIP TI LARGE 6 (CLIP) IMPLANT
CLIP TI MEDIUM 24 (CLIP) ×1 IMPLANT
CLIP TI WIDE RED SMALL 24 (CLIP) IMPLANT
COVER SURGICAL LIGHT HANDLE (MISCELLANEOUS) ×2 IMPLANT
DERMABOND ADVANCED .7 DNX12 (GAUZE/BANDAGES/DRESSINGS) ×2 IMPLANT
DRAIN CHANNEL 19F RND (DRAIN) ×3 IMPLANT
DRAIN PENROSE 0.5X18 (DRAIN) IMPLANT
DRAPE LAPAROSCOPIC ABDOMINAL (DRAPES) ×2 IMPLANT
DRAPE UTILITY XL STRL (DRAPES) ×4 IMPLANT
DRAPE WARM FLUID 44X44 (DRAPES) ×2 IMPLANT
DRSG COVADERM 4X10 (GAUZE/BANDAGES/DRESSINGS) IMPLANT
DRSG COVADERM 4X14 (GAUZE/BANDAGES/DRESSINGS) ×1 IMPLANT
DRSG OPSITE POSTOP 4X10 (GAUZE/BANDAGES/DRESSINGS) ×1 IMPLANT
ELECT BLADE 6.5 EXT (BLADE) IMPLANT
ELECT CAUTERY BLADE 6.4 (BLADE) ×2 IMPLANT
ELECT REM PT RETURN 9FT ADLT (ELECTROSURGICAL) ×2 IMPLANT
ELECTRODE REM PT RTRN 9FT ADLT (ELECTROSURGICAL) ×1 IMPLANT
EVACUATOR SILICONE 100CC (DRAIN) ×2 IMPLANT
GAUZE SPONGE 4X4 12PLY STRL (GAUZE/BANDAGES/DRESSINGS) IMPLANT
GEL PDS (MISCELLANEOUS) IMPLANT
GLOVE BIO SURGEON STRL SZ 6 (GLOVE) ×3 IMPLANT
GLOVE INDICATOR 6.5 STRL GRN (GLOVE) ×3 IMPLANT
GOWN STRL REUS W/ TWL LRG LVL3 (GOWN DISPOSABLE) ×6 IMPLANT
GOWN STRL REUS W/ TWL XL LVL3 (GOWN DISPOSABLE) ×4 IMPLANT
GOWN STRL REUS W/TWL LRG LVL3 (GOWN DISPOSABLE) ×8
GOWN STRL REUS W/TWL XL LVL3 (GOWN DISPOSABLE) ×4
HANDLE SUCTION POOLE (INSTRUMENTS) ×1 IMPLANT
HEMOSTAT SURGICEL 2X14 (HEMOSTASIS) IMPLANT
IRRIG SUCT STRYKERFLOW 2 WTIP (MISCELLANEOUS) ×2 IMPLANT
IRRIGATION SUCT STRKRFLW 2 WTP (MISCELLANEOUS) ×1 IMPLANT
KIT BASIN OR (CUSTOM PROCEDURE TRAY) ×2 IMPLANT
KIT MARKER MARGIN INK (KITS) ×1 IMPLANT
KIT TURNOVER KIT B (KITS) ×2 IMPLANT
L-HOOK LAP DISP 36CM (ELECTROSURGICAL) ×2 IMPLANT
LHOOK LAP DISP 36CM (ELECTROSURGICAL) ×1 IMPLANT
LIGASURE IMPACT 36 18CM CVD LR (INSTRUMENTS) IMPLANT
NDL 22X1.5 STRL (OR ONLY) (MISCELLANEOUS) ×1 IMPLANT
NEEDLE 22X1.5 STRL (OR ONLY) (MISCELLANEOUS) ×2 IMPLANT
NS IRRIG 1000ML POUR BTL (IV SOLUTION) ×4 IMPLANT
PACK GENERAL/GYN (CUSTOM PROCEDURE TRAY) ×1 IMPLANT
PAD ARMBOARD 7.5X6 YLW CONV (MISCELLANEOUS) ×4 IMPLANT
PENCIL BUTTON HOLSTER BLD 10FT (ELECTRODE) ×1 IMPLANT
PENCIL SMOKE EVACUATOR (MISCELLANEOUS) ×2 IMPLANT
POUCH LAPAROSCOPIC INSTRUMENT (MISCELLANEOUS) ×2 IMPLANT
RELOAD 45 VASCULAR/THIN (ENDOMECHANICALS) IMPLANT
RELOAD PROXIMATE TA60MM BLUE (ENDOMECHANICALS) ×2 IMPLANT
RELOAD STAPLE 45 2.5 WHT GRN (ENDOMECHANICALS) IMPLANT
RELOAD STAPLE 60 2.6 WHT THN (STAPLE) IMPLANT
RELOAD STAPLE 60 3.6 BLU REG (STAPLE) IMPLANT
RELOAD STAPLE 60 BLU REG PROX (ENDOMECHANICALS) IMPLANT
SCISSORS LAP 5X35 DISP (ENDOMECHANICALS) ×2 IMPLANT
SET TUBE SMOKE EVAC HIGH FLOW (TUBING) ×2 IMPLANT
SHEARS 1100 HARMONIC 36 (ELECTROSURGICAL) ×2 IMPLANT
SHEARS FOC LG CVD HARMONIC 17C (MISCELLANEOUS) ×1 IMPLANT
SLEEVE SURGEON STRL (DRAPES) ×2 IMPLANT
SLEEVE Z-THREAD 5X100MM (TROCAR) ×5 IMPLANT
SPECIMEN JAR X LARGE (MISCELLANEOUS) ×2 IMPLANT
SPIKE FLUID TRANSFER (MISCELLANEOUS) ×2 IMPLANT
SPONGE INTESTINAL PEANUT (DISPOSABLE) IMPLANT
SPONGE T-LAP 18X18 ~~LOC~~+RFID (SPONGE) ×4 IMPLANT
STAPLE ECHEON FLEX 60 POW ENDO (STAPLE) ×2 IMPLANT
STAPLER RELOAD BLUE 60MM (STAPLE) ×6 IMPLANT
STAPLER RELOAD LINE PROX 60 GR (STAPLE) ×2 IMPLANT
STAPLER RELOAD WHITE 60MM (STAPLE) ×6 IMPLANT
STAPLER RELOADABLE 60 GRN THCK (STAPLE) IMPLANT
STAPLER SKIN PROX WIDE 3.9 (STAPLE) ×1 IMPLANT
STAPLER VISISTAT 35W (STAPLE) ×1 IMPLANT
STRIP PERI DRY VERITAS 60 (STAPLE) IMPLANT
SUCTION POOLE HANDLE (INSTRUMENTS) ×2 IMPLANT
SUT ETHILON 2 0 FS 18 (SUTURE) ×3 IMPLANT
SUT MNCRL AB 4-0 PS2 18 (SUTURE) ×2 IMPLANT
SUT PDS AB 1 TP1 96 (SUTURE) ×2 IMPLANT
SUT PROLENE 2 0 SH DA (SUTURE) ×1 IMPLANT
SUT PROLENE 3 0 SH 48 (SUTURE) ×1 IMPLANT
SUT PROLENE 4-0 RB1 .5 CRCL 36 (SUTURE) ×3 IMPLANT
SUT SILK 0 TIES 10X30 (SUTURE) ×1 IMPLANT
SUT SILK 2 0 REEL (SUTURE) IMPLANT
SUT SILK 2 0 SH (SUTURE) IMPLANT
SUT SILK 2 0 SH CR/8 (SUTURE) ×3 IMPLANT
SUT SILK 2 0 TIES 10X30 (SUTURE) ×2 IMPLANT
SUT SILK 3 0 SH CR/8 (SUTURE) ×2 IMPLANT
SUT SILK 3 0 TIES 10X30 (SUTURE) ×2 IMPLANT
SUT VIC AB 2-0 CT1 36 (SUTURE) ×1 IMPLANT
SYS LAPSCP GELPORT 120MM (MISCELLANEOUS) ×2 IMPLANT
SYSTEM LAPSCP GELPORT 120MM (MISCELLANEOUS) IMPLANT
TOWEL GREEN STERILE (TOWEL DISPOSABLE) ×2 IMPLANT
TOWEL GREEN STERILE FF (TOWEL DISPOSABLE) ×1 IMPLANT
TRAY FOLEY MTR SLVR 16FR STAT (SET/KITS/TRAYS/PACK) ×1 IMPLANT
TRAY LAPAROSCOPIC MC (CUSTOM PROCEDURE TRAY) ×2 IMPLANT
TROCAR 11X100 Z THREAD (TROCAR) IMPLANT
TROCAR BALLN 12MMX100 BLUNT (TROCAR) IMPLANT
TROCAR XCEL NON-BLD 5MMX100MML (ENDOMECHANICALS) ×1 IMPLANT
TROCAR Z THREAD OPTICAL 12X100 (TROCAR) ×2 IMPLANT
TROCAR Z-THREAD OPTICAL 5X100M (TROCAR) ×2 IMPLANT
TUBE CONNECTING 12X1/4 (SUCTIONS) ×2 IMPLANT
TUNNELER SHEATH ON-Q 16GX12 DP (PAIN MANAGEMENT) ×1 IMPLANT
WARMER LAPAROSCOPE (MISCELLANEOUS) ×2 IMPLANT
WATER STERILE IRR 1000ML POUR (IV SOLUTION) ×2 IMPLANT
YANKAUER SUCT BULB TIP NO VENT (SUCTIONS) ×2 IMPLANT

## 2023-08-13 NOTE — Op Note (Signed)
PRE-OPERATIVE DIAGNOSIS: adenocarcinoma pancreatic tail clinical stage III, cT3N2M0, s/p neoadjuvant chemotherapy  POST-OPERATIVE DIAGNOSIS:  similar  PROCEDURE:  Procedure(s): Diagnostic laparoscopy Hand assisted lap spleen Converted to open distal subtotal pancreatectomy Partial gastrectomy Partial small bowel resection  SURGEON:  Surgeon(s): Almond Lint, MD  ASSISTANT:   Feliciana Rossetti, MD  ANESTHESIA:   general  DRAINS: (19) Blake drain(s) in the left abdomen  x 2 Lateral most drain is in the LUQ subdiaphragmatic space going in front of the liver Medial drain is behind the stomach near the pancreatic stump  LOCAL MEDICATIONS USED:  BUPIVICAINE  and LIDOCAINE   SPECIMEN:  Source of Specimen:  see next Left lateral segment liver nodules biopsy Right sided liver nodule biopsy Left lateral segment posterior liver nodule biopsy Spleen Distal pancreas with segment of stomach and small bowel  DISPOSITION OF SPECIMEN:  PATHOLOGY  COUNTS:  YES  DICTATION: .Dragon Dictation  PLAN OF CARE: Admit to inpatient   PATIENT DISPOSITION:  PACU - hemodynamically stable.  FINDINGS:   Numerous suspicious nodules on spleen Large firm mass proximal taill/body of pancreas adherent to retroperitoneum Firm adenopathy Mass went to pancreas over the SMV/portal confluence Small left inguinal hernia seen on dx laparoscopy  EBL: 150 mL  PROCEDURE:   Patient was identified in the holding area and taken to the operating room where he was placed supine on the operating room table.  General endotracheal anesthesia was induced.  Foley catheter was placed.  Patient was then rotated to the leaning spleen position and padded appropriately with the axillary roll as well as making sure to also pad the bony points like the elbows, knees, ankles, wrists.    A timeout was performed according to the surgical safety checklist.  When all was correct, we continued.  A 5 mm Optiview port was placed  under direct visualization after administration of local of the left subcostal margin.  Pneumoperitoneum was achieved to pressure 15 mmHg.  An additional port was placed in the midline just above the umbilicus.  There were several nodules visualized on the liver.  These were biopsied with a laparoscopic biopsy forceps.  An additional nodule was found on the right liver and one on the posterior left lateral segment.  These were also biopsied.  The frozen sections returned as benign.  HandPort was then placed in the upper midline and 2 additional ports were placed in the left abdomen.  The lesser sac was opened and it was immediately apparent that the posterior stomach was adherent to the anterior surface of the tumor.  The lining of colic ligaments were taken down.  Upon retraction of the spleen it became apparent that on the back of the spleen there were multiple white nodules.  The spleen was removed early and sent for frozen section.  After it was freed up a Echelon with vascular load was used to divide the splenic vessels and this was sent off for frozen section.  This was also benign.  The tail of the pancreas was mobilized.  It became apparent that this was densely adherent to the retroperitoneum and there was a segment of small bowel adherent to the tumor as well.  Because of this difficulty, decision was made to open.  The HandPort incision was extended to just below the umbilicus.  The subcutaneous tissues were divided with cautery.  With the incision was fully opened, a Bookwalter retractor was placed to assist with visualization.  The portion of the stomach that was adherent was isolated  and this was divided with a blue load of the Echelon stapler.  This allowed the stomach to be fully freed up and tucked away for better visualization.    The inferior aspect was examined more and it was determined that this was proximal jejunum rather than duodenum stuck in the inferior aspect of the tumor.  This was  also carefully freed up and a side bite was taken off of the jejunum with the stapler to leave that en bloc as well.  The distal pancreas was noted to have tumor going all the way to the body and there was not soft pancreas until getting to the neck.  This was painstakingly and carefully freed up.  Posteriorly the margin was directly on the SMV's anterior surface.  It did appear that there would be a positive margin as a Cytogeneticist was required to take this off of the vein.  There were a few small vessels on the SMV that required suture ligation.  There was not any significant bleeding from this.    The tumor was adherent to the retroperitoneum and there were multiple visible nodes that were concerning.  As much as possible, this was taken off but the retroperitoneum had a lot of dense inflammatory tissue as well.  The gastroduodenal artery also required immobilization.  Originally I felt that this could potentially be preserved.  However, it became apparent that we could not get across the pancreas without dividing the GDA.  This was suture-ligated and clipped.  The pancreas was stapled and then cut with the #10 blade.  The staple line had 2 small areas of bleeding that were oversewn with 2-0 silk sutures in figure-of-eight fashion.  The entire staple line was run with locking 2-0 PDS suture.  The abdomen was irrigated copiously.  Once there was no evidence of bleeding, it became apparent that the one of the left lateral segment biopsy sites had some bile coming from it.  This area was extremely thin and was in the far left lateral segment.  This was just excised with the harmonic scalpel.  2-0 Vicryl sutures were placed to reinforce the liver border after cauterization to minimize the risk of bile leaking.  The proximal omentum was freed up with the harmonic and pedicled omental flap was placed over the tail the pancreas after placement of Vistaseal.  2 drains were placed in the left abdominal port  sites.  This left 1 far left lateral port site near the costal margin and 1 in the left upper quadrant at the costal margin.  The fascia was then closed using running 1 looped PDS suture.  The made midline incision was closed using skin staples.  The 2 remaining port sites were closed with 4-0 Monocryl.  The patient was allowed to emerge from anesthesia and was taken the PACU in stable condition.  Needle, sponge, and instrument counts were correct x 1.

## 2023-08-13 NOTE — Consult Note (Signed)
Initial Consultation Note   Patient: Scott [name]:[id number] DOB: 06/15/1951 PCP: [name], MD DOA: 08/13/2023 DOS: the patient was seen and examined on 08/13/2023 Primary service: Almond Lint, MD  Referring physician: Donell Beers Reason for consult: Diabetic management  Assessment/Plan: Assessment and Plan:    Pancreatic cancer s/p whipple Patient had diagnostic laparoscopy, hand-assisted lap spleen converted to open distal subtotal pancreatectomy, partial gastrectomy, and partial small bowel resection with [name]. -Per surgery  Uncontrolled diabetes mellitus type 2, with long-term use of insulin Patient had last taken Lantus 20 units yesterday.  Postsurgery patient's glucose was noted to be elevated up to 228.  Last hemoglobin A1c was 7.8 on 07/2023. -Hypoglycemia protocols -Hold glipizide and metformin -Likely can resume Jardiance -CBGs every 4 hours with moderate SSI -Diabetic education consulted    [facility] will continue to follow the patient.  HPI: Scott [name] is a 72 y.o. 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.  He is followed in the outpatient setting by Dr. Mosetta Putt of oncology and had undergone chemotherapy.  [facility] consulted for diabetes management.  At baseline patient appears to be on glipizide 5 mg nightly, 20 units of Lantus daily, Jardiance 25 mg daily, and metformin  XR 1000 mg at supper.  Records note last hemoglobin A1c was 7.8.  Review of Systems: As mentioned in the history of present illness. All other systems reviewed and are negative. Past Medical History:  Diagnosis Date   Arthritis    Bilateral Knees/Fingers   CAD (coronary artery disease)    Cancer (HCC) 2024   Pancreatic Cancer   Cerebrovascular disease     Diabetes mellitus without complication (HCC)    Dysrhythmia    Hx. A.Fib. Been NSR since ablation   History of kidney stones    HLD (hyperlipidemia)    HTN (hypertension)    IBS (irritable bowel syndrome)    Patient states did not have   Lumbar stenosis    MI 1996   Persistent atrial fibrillation (HCC)    PVD (peripheral vascular disease) (HCC)    Stroke (cerebrum) (HCC)    Tobacco abuse    Past Surgical History:  Procedure Laterality Date   ABDOMINAL AORTOGRAM W/LOWER EXTREMITY N/A 01/25/2017   Procedure: Abdominal Aortogram w/Lower Extremity;  Surgeon: Sherren Kerns, MD;  Location: [facility] INVASIVE CV LAB;  Service: Cardiovascular;  Laterality: N/A;   ANTERIOR CRUCIATE LIGAMENT REPAIR     CARDIAC CATHETERIZATION     CONE   CARDIAC CATHETERIZATION N/A 07/27/2016   Procedure: Left Heart Cath and Coronary Angiography;  Surgeon: Tonny Bollman, MD;  Location: [facility] INVASIVE CV LAB;  Service: Cardiovascular;  Laterality: N/A;   CARDIOVERSION N/A 06/29/2016   Procedure: CARDIOVERSION;  Surgeon: Pricilla Riffle, MD;  Location: [facility] ENDOSCOPY;  Service: Cardiovascular;  Laterality: N/A;   CARDIOVERSION N/A 03/05/2018   Procedure: CARDIOVERSION;  Surgeon: Laurey Morale, MD;  Location: [facility] ENDOSCOPY;  Service: Cardiovascular;  Laterality: N/A;   CHOLECYSTECTOMY     ENDARTERECTOMY Right 12/06/2017   Procedure: ENDARTERECTOMY CAROTID RIGHT;  Surgeon: Nada Libman, MD;  Location: [facility] OR;  Service: Vascular;  Laterality: Right;   ESOPHAGOGASTRODUODENOSCOPY (EGD) WITH PROPOFOL N/A 05/01/2023   Procedure: ESOPHAGOGASTRODUODENOSCOPY (EGD) WITH PROPOFOL;  Surgeon: Willis Modena, MD;  Location: [facility] ENDOSCOPY;  Service: Gastroenterology;  Laterality:  N/A;   FINE NEEDLE ASPIRATION N/A 05/01/2023   Procedure: FINE NEEDLE ASPIRATION (FNA) LINEAR;  Surgeon: Willis Modena, MD;  Location: [facility] ENDOSCOPY;  Service: Gastroenterology;  Laterality: N/A;   KNEE SURGERY Bilateral 1982, 1988, 1997, 2000   x 4  times.   LOWER EXTREMITY ANGIOGRAPHY N/A 07/03/2019   Procedure: LOWER EXTREMITY ANGIOGRAPHY;  Surgeon: Sherren Kerns, MD;  Location: [facility] INVASIVE CV LAB;  Service: Cardiovascular;  Laterality: N/A;   LUMBAR LAMINECTOMY/DECOMPRESSION MICRODISCECTOMY Bilateral 09/09/2018   Procedure: Laminectomy and Foraminotomy bilateral Lumbar three-Lumbar four - Lumbar four-Lumbar five;  Surgeon: Julio Sicks, MD;  Location: [facility] OR;  Service: Neurosurgery;  Laterality: Bilateral;   PATCH ANGIOPLASTY Right 12/06/2017   Procedure: PATCH ANGIOPLASTY RIGHT CAROTID ARTERY;  Surgeon: Nada Libman, MD;  Location: [facility] OR;  Service: Vascular;  Laterality: Right;   PERIPHERAL VASCULAR INTERVENTION Right 01/25/2017   Procedure: Peripheral Vascular Intervention;  Surgeon: Sherren Kerns, MD;  Location: [facility] INVASIVE CV LAB;  Service: Cardiovascular;  Laterality: Right;   PERIPHERAL VASCULAR INTERVENTION Right 07/03/2019   Procedure: PERIPHERAL VASCULAR INTERVENTION;  Surgeon: Sherren Kerns, MD;  Location: [facility] INVASIVE CV LAB;  Service: Cardiovascular;  Laterality: Right;   PORTACATH PLACEMENT Left 05/14/2023   Procedure: INSERTION PORT-A-CATH;  Surgeon: Almond Lint, MD;  Location: [facility] OR;  Service: General;  Laterality: Left;   TONSILLECTOMY     UPPER ESOPHAGEAL ENDOSCOPIC ULTRASOUND (EUS) Bilateral 05/01/2023   Procedure: UPPER ESOPHAGEAL ENDOSCOPIC ULTRASOUND (EUS);  Surgeon: Willis Modena, MD;  Location: [facility] ENDOSCOPY;  Service: Gastroenterology;  Laterality: Bilateral;   Social History:  reports that he has been smoking cigarettes. He started smoking about 52 years ago. He has a 50 pack-year smoking history. He has never used smokeless tobacco. He reports current alcohol use. He reports that he does not use drugs.  Allergies  Allergen Reactions   Augmentin [Amoxicillin-Pot Clavulanate] Rash    Family History  Problem Relation Age of Onset   Heart disease Father    Melanoma Father    Testicular cancer  Father    Hypertension Mother    Arthritis Mother    Arthritis Sister    Lung disease Neg Hx     Prior to Admission medications   Medication Sig Start Date End Date Taking? Authorizing Provider  amLODipine (NORVASC) 5 MG tablet Take 5 mg by mouth at bedtime. 11/29/22  Yes [provider]  apixaban (ELIQUIS) 5 MG TABS tablet Take 1 tablet (5 mg total) by mouth 2 (two) times daily. 07/03/23  Yes [name], [name], MD  aspirin EC 81 MG tablet Take 81 mg by mouth daily. 09/08/20  Yes [provider]  esomeprazole (NEXIUM) 40 MG capsule Take 40 mg by mouth daily as needed (heartburn/hiccups).   Yes [provider]  fenofibrate (TRICOR) 48 MG tablet Take 48 mg by mouth daily. 01/16/22  Yes [provider]  gabapentin (NEURONTIN) 300 MG capsule Take 900 mg by mouth 2 (two) times daily. 02/03/20  Yes [provider]  glipiZIDE (GLUCOTROL XL) 5 MG 24 hr tablet Take 5 mg by mouth daily after supper. 01/09/23  Yes [provider]  Insulin Glargine (BASAGLAR KWIKPEN) 100 UNIT/ML Inject 20 Units into the skin daily.   Yes [provider]  JARDIANCE 25 MG TABS tablet Take 25 mg by mouth daily. 03/03/22  Yes [provider]  metFORMIN (GLUCOPHAGE-XR) 500 MG 24 hr tablet Take 1,000 mg by mouth daily after supper. 01/10/22  Yes [provider]  morphine (MS CONTIN) 15 MG 12 hr tablet Take 1 tablet (15 mg total) by mouth every 12 (twelve) hours. Patient taking differently: Take 15 mg by mouth at bedtime. 07/09/23  Yes [name], MD  ondansetron (ZOFRAN) 4 MG tablet Take 4 mg by mouth every 8 (eight) hours as needed for nausea or vomiting.   Yes [provider]  oxyCODONE-acetaminophen (PERCOCET/ROXICET) 5-325 MG tablet Take 1 tablet by mouth every 8 (eight) hours as needed for severe pain (pain score 7-10). 08/01/23  Yes [name], MD  polyethylene glycol (MIRALAX / GLYCOLAX) 17 g packet Take 17 g by mouth daily as needed for  moderate constipation.   Yes [provider]  rosuvastatin (CRESTOR) 40 MG tablet Take 40 mg by mouth daily.   Yes [provider]  spironolactone (ALDACTONE) 25 MG tablet Take 25 mg by mouth daily.   Yes [provider]  valsartan (DIOVAN) 320 MG tablet Take 320 mg by mouth daily. 02/22/20  Yes [provider]  diazepam (VALIUM) 2 MG tablet Take 2 mg by mouth 2 (two) times daily as needed for anxiety. 02/18/20   [provider]  sildenafil (VIAGRA) 100 MG tablet Take 100 mg by mouth as needed for erectile dysfunction. 04/14/18   [provider]    Physical Exam: Vitals:   08/13/23 1445 08/13/23 1500 08/13/23 1515 08/13/23 1530  BP: (!) 152/69 133/66 (!) 142/67 (!) 140/67  Pulse: 80 80 84 83  Resp: (!) 21 18 17 20   Temp:      TempSrc:      SpO2: 97% 95% 93% 92%  Weight:      Height:       Constitutional: Elderly male currently lethargic, but arousable on PACU Eyes: PERRL, lids and conjunctivae normal ENMT: Mucous membranes are moist.   Neck: normal, supple,   Respiratory: Normal respiratory effort with decreased overall aeration.  No significant wheezes appreciated at this time.  Patient on nasal cannula oxygen, but able to talk in complete sentences. Cardiovascular: Regular rate and rhythm, no murmurs / rubs / gallops.   Neurologic: CN 2-12 grossly intact.  Able to move all extremities Psychiatric: Normal judgment and insight.  Lethargic and oriented to person and place.  Data Reviewed:       Primary team communication:  Thank you very much for involving [place] in the care of your patient.  Author: Clydie Braun, MD 08/13/2023 4:37 PM  For on call review [email or web address].

## 2023-08-13 NOTE — Transfer of Care (Signed)
Immediate Anesthesia Transfer of Care Note  Patient: BURYL SCHNEIDERS  Procedure(s) Performed: LAPAROSCOPY DIAGNOSTIC (Abdomen) HAND ASSISTED LAPAROSCOPIC DISTAL PANCREATECTOMY SPLENECTOMY (Abdomen)  Patient Location: PACU  Anesthesia Type:General  Level of Consciousness: drowsy and patient cooperative  Airway & Oxygen Therapy: Patient Spontanous Breathing and Patient connected to face mask oxygen  Post-op Assessment: Report given to RN and Post -op Vital signs reviewed and stable  Post vital signs: Reviewed  Last Vitals:  Vitals Value Taken Time  BP 160/79 08/13/23 1415  Temp    Pulse 77 08/13/23 1420  Resp 22 08/13/23 1420  SpO2 97 % 08/13/23 1420  Vitals shown include unfiled device data.  Last Pain:  Vitals:   08/13/23 0645  TempSrc:   PainSc: 0-No pain      Patients Stated Pain Goal: 2 (08/13/23 0645)  Complications: No notable events documented.

## 2023-08-13 NOTE — Anesthesia Procedure Notes (Signed)
Procedure Name: Intubation Date/Time: 08/13/2023 7:57 AM  Performed by: Loleta Arnulfo Batson, CRNAPre-anesthesia Checklist: Patient identified, Emergency Drugs available, Suction available and Patient being monitored Patient Re-evaluated:Patient Re-evaluated prior to induction Oxygen Delivery Method: Circle system utilized Preoxygenation: Pre-oxygenation with 100% oxygen Induction Type: IV induction Ventilation: Mask ventilation without difficulty Laryngoscope Size: Mac and 4 Grade View: Grade II Tube type: Oral Tube size: 7.5 mm Number of attempts: 1 Airway Equipment and Method: Stylet Placement Confirmation: ETT inserted through vocal cords under direct vision, positive ETCO2 and breath sounds checked- equal and bilateral Secured at: 23 cm Tube secured with: Tape Dental Injury: Teeth and Oropharynx as per pre-operative assessment  Comments: Performed by SRNA with supervision from CRNA and MDA

## 2023-08-13 NOTE — Interval H&P Note (Signed)
History and Physical Interval Note:  08/13/2023 7:37 AM  Scott [name]  has presented today for surgery, with the diagnosis of PANCREATIC CANCER.  The various methods of treatment have been discussed with the patient and family. After consideration of risks, benefits and other options for treatment, the patient has consented to  Procedure(s): LAPAROSCOPY DIAGNOSTIC (N/A) HAND ASSISTED LAPAROSCOPIC DISTAL PANCREATECTOMY (N/A) SPLENECTOMY POSSIBLE OPEN (N/A) as a surgical intervention.  [name] history has been reviewed, patient examined, no change in status, stable for surgery.  I have reviewed the patient's chart and labs.  Questions were answered to the patient's satisfaction.     Almond Lint

## 2023-08-13 NOTE — Anesthesia Postprocedure Evaluation (Signed)
Anesthesia Post Note  Patient: Scott [name]  Procedure(s) Performed: LAPAROSCOPY DIAGNOSTIC (Abdomen) HAND ASSISTED LAPAROSCOPIC SPLENECTOMY (Abdomen) CONVERTED TO OPEN DISTAL SUBTOTAL PANCREATECTOMY (Abdomen) PARTIAL GASTRECTOMY (Abdomen) PARTIAL SMALL BOWEL RESECTION (Abdomen)     Patient location during evaluation: PACU Anesthesia Type: General Level of consciousness: awake Pain management: pain level controlled Vital Signs Assessment: post-procedure vital signs reviewed and stable Respiratory status: spontaneous breathing, nonlabored ventilation and respiratory function stable Cardiovascular status: blood pressure returned to baseline and stable Postop Assessment: no apparent nausea or vomiting Anesthetic complications: no   No notable events documented.  Last Vitals:  Vitals:   08/13/23 1515 08/13/23 1530  BP: (!) 142/67 (!) 140/67  Pulse: 84 83  Resp: 17 20  Temp:    SpO2: 93% 92%    Last Pain:  Vitals:   08/13/23 1550  TempSrc:   PainSc: 9                  Linton Rump

## 2023-08-13 NOTE — Anesthesia Procedure Notes (Signed)
Arterial Line Insertion Start/End11/19/2024 7:00 AM, 08/13/2023 7:15 AM Performed by: Loleta Avant Printy, CRNA, CRNA  Patient location: Pre-op. Preanesthetic checklist: patient identified, IV checked, site marked, risks and benefits discussed, surgical consent, monitors and equipment checked, pre-op evaluation, timeout performed and anesthesia consent Lidocaine 1% used for infiltration Right, radial was placed Catheter size: 20 G Hand hygiene performed  and maximum sterile barriers used  Allen's test indicative of satisfactory collateral circulation Attempts: 2 Procedure performed using ultrasound guided technique. Ultrasound Notes:anatomy identified, needle tip was noted to be adjacent to the nerve/plexus identified and no ultrasound evidence of intravascular and/or intraneural injection Following insertion, dressing applied and Biopatch. Post procedure assessment: normal and unchanged  Patient tolerated the procedure well with no immediate complications.

## 2023-08-13 NOTE — Discharge Instructions (Addendum)
[facility], [place] ?[phone] ? ?ABDOMINAL SURGERY: POST OP INSTRUCTIONS ? ?Always review your discharge instruction sheet given to you by the facility where your surgery was performed. ? ?IF YOU HAVE DISABILITY OR FAMILY LEAVE FORMS, YOU MUST BRING THEM TO THE OFFICE FOR PROCESSING.  PLEASE DO NOT GIVE THEM TO YOUR DOCTOR. ? ?A prescription for pain medication may be given to you upon discharge.  Take your pain medication as prescribed, if needed.  If narcotic pain medicine is not needed, then you may take acetaminophen (Tylenol) or ibuprofen (Advil) as needed. ?Take your usually prescribed medications unless otherwise directed. ?If you need a refill on your pain medication, please contact your pharmacy. They will contact our office to request authorization.  Prescriptions will not be filled after 5pm or on week-ends. ?You should follow a light diet the first few days after arrival home, such as soup and crackers, pudding, etc.unless your doctor has advised otherwise. A high-fiber, low fat diet can be resumed as tolerated.   Be sure to include lots of fluids daily. Most patients will experience some swelling and bruising on the chest and neck area.  Ice packs will help.  Swelling and bruising can take several days to resolve ?Most patients will experience some swelling and bruising in the area of the incision. Ice pack will help. Swelling and bruising can take several days to resolve.Marland Kitchen  ?It is common to experience some constipation if taking pain medication after surgery.  Increasing fluid intake and taking a stool softener will usually help or prevent this problem from occurring.  A mild laxative (Milk of Magnesia or Miralax) should be taken according to package directions if there are no bowel movements after 48 hours. ? You may have steri-strips (small skin tapes) in place directly over the incision.  These strips should be left on the skin for 10-14 days.  If your surgeon used skin glue on  the incision, you may shower in 48 hours.  The glue will flake off over the next 2-3 weeks.  Any sutures or staples will be removed at the office during your follow-up visit. You may find that a light gauze bandage over your incision may keep your staples from being rubbed or pulled. You may shower and replace the bandage daily. ?ACTIVITIES:  You may resume regular (light) daily activities beginning the next day--such as daily self-care, walking, climbing stairs--gradually increasing activities as tolerated.  You may have sexual intercourse when it is comfortable.  Refrain from any heavy lifting or straining until approved by your doctor. ?You may drive when you no longer are taking prescription pain medication, you can comfortably wear a seatbelt, and you can safely maneuver your car and apply brakes ?Return to Work: __________8 weeks if applicable_________________________ ?You should see your doctor in the office for a follow-up appointment approximately two weeks after your surgery.  Make sure that you call for this appointment within a day or two after you arrive home to insure a convenient appointment time. ?OTHER INSTRUCTIONS:  ?_____________________________________________________________ ?_____________________________________________________________ ? ?WHEN TO CALL YOUR DOCTOR: ?Fever over 101.0 ?Inability to urinate ?Nausea and/or vomiting ?Extreme swelling or bruising ?Continued bleeding from incision. ?Increased pain, redness, or drainage from the incision. ?Difficulty swallowing or breathing ?Muscle cramping or spasms. ?Numbness or tingling in hands or feet or around lips. ? ?The clinic staff is available to answer your questions during regular business hours.  Please don?t hesitate to call and ask to speak to  one of the nurses if you have concerns. ? ?For further questions, please visit [email or web address] ? ? ?

## 2023-08-14 ENCOUNTER — Encounter (HOSPITAL_COMMUNITY): Payer: Self-pay | Admitting: General Surgery

## 2023-08-14 DIAGNOSIS — C252 Malignant neoplasm of tail of pancreas: Secondary | ICD-10-CM | POA: Diagnosis not present

## 2023-08-14 LAB — COMPREHENSIVE METABOLIC PANEL
ALT: 103 U/L — ABNORMAL HIGH (ref 0–44)
AST: 100 U/L — ABNORMAL HIGH (ref 15–41)
Albumin: 3.1 g/dL — ABNORMAL LOW (ref 3.5–5.0)
Alkaline Phosphatase: 56 U/L (ref 38–126)
Anion gap: 8 (ref 5–15)
BUN: 5 mg/dL — ABNORMAL LOW (ref 8–23)
CO2: 20 mmol/L — ABNORMAL LOW (ref 22–32)
Calcium: 8.1 mg/dL — ABNORMAL LOW (ref 8.9–10.3)
Chloride: 104 mmol/L (ref 98–111)
Creatinine, Ser: 0.65 mg/dL (ref 0.61–1.24)
GFR, Estimated: >60 mL/min (ref >60)
Glucose, Bld: 175 mg/dL — ABNORMAL HIGH (ref 70–99)
Potassium: 4 mmol/L (ref 3.5–5.1)
Sodium: 132 mmol/L — ABNORMAL LOW (ref 135–145)
Total Bilirubin: 0.9 mg/dL (ref <1.2)
Total Protein: 5.5 g/dL — ABNORMAL LOW (ref 6.5–8.1)

## 2023-08-14 LAB — POCT I-STAT 7, (LYTES, BLD GAS, ICA,H+H)
Acid-base deficit: 5 mmol/L — ABNORMAL HIGH (ref 0.0–2.0)
Acid-base deficit: 6 mmol/L — ABNORMAL HIGH (ref 0.0–2.0)
Acid-base deficit: 6 mmol/L — ABNORMAL HIGH (ref 0.0–2.0)
Bicarbonate: 22.2 mmol/L (ref 20.0–28.0)
Bicarbonate: 21.4 mmol/L (ref 20.0–28.0)
Bicarbonate: 19.8 mmol/L — ABNORMAL LOW (ref 20.0–28.0)
Calcium, Ion: 1.19 mmol/L (ref 1.15–1.40)
Calcium, Ion: 1.2 mmol/L (ref 1.15–1.40)
Calcium, Ion: 1.15 mmol/L (ref 1.15–1.40)
HCT: 36 % — ABNORMAL LOW (ref 39.0–52.0)
HCT: 36 % — ABNORMAL LOW (ref 39.0–52.0)
HCT: 34 % — ABNORMAL LOW (ref 39.0–52.0)
Hemoglobin: 12.2 g/dL — ABNORMAL LOW (ref 13.0–17.0)
Hemoglobin: 12.2 g/dL — ABNORMAL LOW (ref 13.0–17.0)
Hemoglobin: 11.6 g/dL — ABNORMAL LOW (ref 13.0–17.0)
O2 Saturation: 96 %
O2 Saturation: 99 %
O2 Saturation: 100 %
Potassium: 4.4 mmol/L (ref 3.5–5.1)
Potassium: 4.2 mmol/L (ref 3.5–5.1)
Potassium: 4.2 mmol/L (ref 3.5–5.1)
Sodium: 134 mmol/L — ABNORMAL LOW (ref 135–145)
Sodium: 134 mmol/L — ABNORMAL LOW (ref 135–145)
Sodium: 134 mmol/L — ABNORMAL LOW (ref 135–145)
TCO2: 24 mmol/L (ref 22–32)
TCO2: 23 mmol/L (ref 22–32)
TCO2: 21 mmol/L — ABNORMAL LOW (ref 22–32)
pCO2 arterial: 52.1 mm[Hg] — ABNORMAL HIGH (ref 32–48)
pCO2 arterial: 49.9 mm[Hg] — ABNORMAL HIGH (ref 32–48)
pCO2 arterial: 40.4 mm[Hg] (ref 32–48)
pH, Arterial: 7.238 — ABNORMAL LOW (ref 7.35–7.45)
pH, Arterial: 7.239 — ABNORMAL LOW (ref 7.35–7.45)
pH, Arterial: 7.299 — ABNORMAL LOW (ref 7.35–7.45)
pO2, Arterial: 97 mm[Hg] (ref 83–108)
pO2, Arterial: 170 mm[Hg] — ABNORMAL HIGH (ref 83–108)
pO2, Arterial: 193 mm[Hg] — ABNORMAL HIGH (ref 83–108)

## 2023-08-14 LAB — CBC
HCT: 39.3 % (ref 39.0–52.0)
Hemoglobin: 13.2 g/dL (ref 13.0–17.0)
MCH: 30.3 pg (ref 26.0–34.0)
MCHC: 33.6 g/dL (ref 30.0–36.0)
MCV: 90.1 fL (ref 80.0–100.0)
Platelets: 220 10*3/uL (ref 150–400)
RBC: 4.36 MIL/uL (ref 4.22–5.81)
RDW: 14.7 % (ref 11.5–15.5)
WBC: 15.2 10*3/uL — ABNORMAL HIGH (ref 4.0–10.5)
nRBC: 0 % (ref 0.0–0.2)

## 2023-08-14 LAB — GLUCOSE, CAPILLARY
Glucose-Capillary: 135 mg/dL — ABNORMAL HIGH (ref 70–99)
Glucose-Capillary: 186 mg/dL — ABNORMAL HIGH (ref 70–99)
Glucose-Capillary: 178 mg/dL — ABNORMAL HIGH (ref 70–99)
Glucose-Capillary: 148 mg/dL — ABNORMAL HIGH (ref 70–99)
Glucose-Capillary: 148 mg/dL — ABNORMAL HIGH (ref 70–99)
Glucose-Capillary: 137 mg/dL — ABNORMAL HIGH (ref 70–99)

## 2023-08-14 LAB — MAGNESIUM: Magnesium: 1.4 mg/dL — ABNORMAL LOW (ref 1.7–2.4)

## 2023-08-14 LAB — PHOSPHORUS: Phosphorus: 2.7 mg/dL (ref 2.5–4.6)

## 2023-08-14 MED ORDER — KCL IN DEXTROSE-NACL 20-5-0.45 MEQ/L-%-% IV SOLN
INTRAVENOUS | Status: DC
  Filled 2023-08-14: qty 1000

## 2023-08-14 MED ORDER — CHLORHEXIDINE GLUCONATE CLOTH 2 % EX PADS
6.0000 | MEDICATED_PAD | Freq: Every day | CUTANEOUS | Status: DC
  Administered 2023-08-14: 6 via TOPICAL

## 2023-08-14 MED ORDER — DIPHENHYDRAMINE HCL 50 MG/ML IJ SOLN: 12.5000 mg | Freq: Four times a day (QID) | INTRAMUSCULAR | Status: DC | PRN

## 2023-08-14 MED ORDER — ONDANSETRON HCL 4 MG/2ML IJ SOLN: 4.0000 mg | Freq: Four times a day (QID) | INTRAMUSCULAR | Status: DC | PRN

## 2023-08-14 MED ORDER — SODIUM CHLORIDE 0.9% FLUSH: 9.0000 mL | INTRAVENOUS | Status: DC | PRN

## 2023-08-14 MED ORDER — NALOXONE HCL 0.4 MG/ML IJ SOLN: 0.4000 mg | INTRAMUSCULAR | Status: DC | PRN

## 2023-08-14 MED ORDER — METHOCARBAMOL 1000 MG/10ML IJ SOLN
750.0000 mg | Freq: Three times a day (TID) | INTRAMUSCULAR | Status: DC
  Administered 2023-08-14 – 2023-08-16 (×7): 750 mg via INTRAVENOUS
  Filled 2023-08-14 (×7): qty 10

## 2023-08-14 MED ORDER — KCL IN DEXTROSE-NACL 20-5-0.45 MEQ/L-%-% IV SOLN
INTRAVENOUS | Status: AC
  Filled 2023-08-14: qty 1000

## 2023-08-14 MED ORDER — DICLOFENAC SODIUM 1 % EX GEL
4.0000 g | Freq: Four times a day (QID) | CUTANEOUS | Status: DC | PRN
Start: 2023-08-14 — End: 2023-08-19
  Administered 2023-08-19: 4 g via TOPICAL
  Filled 2023-08-14: qty 100

## 2023-08-14 MED ORDER — DIPHENHYDRAMINE HCL 12.5 MG/5ML PO ELIX: 12.5000 mg | ORAL_SOLUTION | Freq: Four times a day (QID) | ORAL | Status: DC | PRN

## 2023-08-14 MED ORDER — ENSURE ENLIVE PO LIQD
237.0000 mL | Freq: Two times a day (BID) | ORAL | Status: DC
  Administered 2023-08-15: 237 mL via ORAL

## 2023-08-14 MED ORDER — HYDROMORPHONE 1 MG/ML IV SOLN
INTRAVENOUS | Status: DC
  Administered 2023-08-14: 2.4 mg via INTRAVENOUS
  Administered 2023-08-15: 30 mg via INTRAVENOUS
  Administered 2023-08-15: 5.33 mg via INTRAVENOUS
  Administered 2023-08-16: 5.4 mg via INTRAVENOUS
  Administered 2023-08-16: 5.19 mg via INTRAVENOUS
  Filled 2023-08-14 (×2): qty 30

## 2023-08-14 MED ORDER — OXYCODONE HCL 5 MG PO TABS
10.0000 mg | ORAL_TABLET | ORAL | Status: DC | PRN
  Administered 2023-08-14: 10 mg via ORAL
  Filled 2023-08-14: qty 2

## 2023-08-14 MED ORDER — OXYCODONE HCL 5 MG PO TABS
15.0000 mg | ORAL_TABLET | ORAL | Status: DC | PRN
  Administered 2023-08-14 – 2023-08-16 (×7): 15 mg via ORAL
  Filled 2023-08-14 (×7): qty 3

## 2023-08-14 NOTE — Progress Notes (Signed)
This RN spoke to [place] MD over the phone about continuing expiring IVF and pt's request to increase oxy dose, Voltaren gel, and complaint of "unbearable" pain after walking with therapy. Received verbal orders with read back for continuing IVF, oxy 15 mg q4hrs. PRN, 2 mg rescue dilaudid IV STAT once, and Voltaren gel.   Pt states he does not need the rescue dilaudid IV STAT as he has reached a manageable pain level after settling down in bed + reassurance while waiting for MD's response. Requests that he will just need his oxy at 1611. This RN did not put in the rescue dilaudid IV STAT order.

## 2023-08-14 NOTE — Plan of Care (Signed)

## 2023-08-14 NOTE — Evaluation (Signed)
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 PMH: DM, GERD, HLD, HTN, CA, Afib  Clinical Impression  Pt presenting with decreased mobility and activity tolerance. Pt lives with wife and was independent with ADLs and did not use AD PTA, reports significant weight loss and some decreased strength since going through chemo. Pt tolerated mobility minA with intermittent 10/10 abdominal pain, required assistance standing and ambulating. Anticipate pt will progress quickly when controlling pain. Pt would benefit from continued skilled acute physical therapy to maximize function and indpendence. No post acute physical therapy recommended.         If plan is discharge home, recommend the following: A little help with walking and/or transfers;A little help with bathing/dressing/bathroom;Assistance with cooking/housework;Help with stairs or ramp for entrance   Can travel by private vehicle        Equipment Recommendations None recommended by PT  Recommendations for Other Services  OT consult    Functional Status Assessment Patient has had a recent decline in their functional status and demonstrates the ability to make significant improvements in function in a reasonable and predictable amount of time.     Precautions / Restrictions Precautions Precautions: Fall Precaution Comments: JP drain x2 L abdomen, PCA Restrictions Weight Bearing Restrictions: No      Mobility  Bed Mobility Overal bed mobility: Needs Assistance Bed Mobility: Supine to Sit     Supine to sit: Min assist     General bed mobility comments: Pt requested assistance moving legs OOB and lifting trunk    Transfers Overall transfer level: Needs assistance Equipment used: Rolling walker (2  wheels) Transfers: Sit to/from Stand Sit to Stand: Min assist           General transfer comment: Pt requested assistance lifting off, minA to rise    Ambulation/Gait Ambulation/Gait assistance: Min assist Gait Distance (Feet): 100 Feet Assistive device: Rolling walker (2 wheels) Gait Pattern/deviations: Step-through pattern, Decreased stride length   Gait velocity interpretation: 1.31 - 2.62 ft/sec, indicative of limited community ambulator   General Gait Details: Pt ambulated with RW, minA to assist with lines, cueing for upright posture, and safety, pt took intermitent restbreaks due to pain around surgical site  Stairs            Wheelchair Mobility     Tilt Bed    Modified Rankin (Stroke Patients Only)       Balance Overall balance assessment: Needs assistance Sitting-balance support: No upper extremity supported, Feet unsupported Sitting balance-Leahy Scale: Good Sitting balance - Comments: Pt tolerated EOB with various UE support levels, various trunk movement to avoid pain   Standing balance support: Bilateral upper extremity supported, Single extremity supported, During functional activity Standing balance-Leahy Scale: Fair Standing balance comment: use of RW for standing activity, would complain of pain and remove single UE from RW to clutch abdomen                             Pertinent Vitals/Pain Pain Assessment Pain Assessment: 0-10 Pain Score: 10-Worst pain ever Pain Location: abdomen at surgical site Pain Descriptors / Indicators: Constant, Dull, Sharp, Guarding, Shooting Pain Intervention(s): Limited activity within patient's tolerance, Monitored during session, PCA encouraged, Patient requesting pain meds-RN notified    Home  Living Family/patient expects to be discharged to:: Private residence Living Arrangements: Spouse/significant other Available Help at Discharge: Available 24 hours/day;Family Type of Home: House Home  Access: Stairs to enter   [employer] of Steps: 2   Home Layout: Able to live on main level with bedroom/bathroom;Other (Comment);Two level (basement, does not need to go to often) Home Equipment: Shower seat - built [employer] fundraiser (2 wheels);Hand held shower head Additional Comments: one dog and two cats    Prior Function Prior Level of Function : Independent/Modified Independent                     Extremity/Trunk Assessment   Upper Extremity Assessment Upper Extremity Assessment: Defer to OT evaluation    Lower Extremity Assessment Lower Extremity Assessment: Overall WFL for tasks assessed    Cervical / Trunk Assessment Cervical / Trunk Assessment: Kyphotic  Communication   Communication Communication: No apparent difficulties Cueing Techniques: Verbal cues  Cognition Arousal: Alert Behavior During Therapy: WFL for tasks assessed/performed Overall Cognitive Status: Within Functional Limits for tasks assessed                                          General Comments      Exercises     Assessment/Plan    PT Assessment Patient needs continued PT services  PT Problem List Decreased strength;Decreased activity tolerance;Decreased balance;Decreased mobility;Decreased knowledge of use of DME       PT Treatment Interventions DME instruction;Gait training;Stair training;Functional mobility training;Therapeutic activities;Therapeutic exercise;Balance training;Patient/family education    PT Goals (Current goals can be found in the Care Plan section)  Acute Rehab PT Goals Patient Stated Goal: return home PT Goal Formulation: With patient Time For Goal Achievement: 08/28/23 Potential to Achieve Goals: Good    Frequency Min 1X/week     Co-evaluation               AM-PAC PT "6 Clicks" Mobility  Outcome Measure Help needed turning from your back to your side while in a flat bed without using bedrails?: A Little Help  needed moving from lying on your back to sitting on the side of a flat bed without using bedrails?: A Little Help needed moving to and from a bed to a chair (including a wheelchair)?: A Little Help needed standing up from a chair using your arms (e.g., wheelchair or bedside chair)?: A Little Help needed to walk in hospital room?: A Little Help needed climbing 3-5 steps with a railing? : A Little 6 Click Score: 18    End of Session   Activity Tolerance: Patient tolerated treatment well Patient left: in bed;with call bell/phone within reach Nurse Communication: Mobility status PT Visit Diagnosis: Unsteadiness on feet (R26.81);Other abnormalities of gait and mobility (R26.89);Muscle weakness (generalized) (M62.81)    Time: 1610-9604 PT Time Calculation (min) (ACUTE ONLY): 27 min   Charges:   PT Evaluation $PT Eval Moderate Complexity: 1 Mod PT Treatments $Gait Training: 8-22 mins PT General Charges $$ ACUTE PT VISIT: 1 Visit         Andrey Farmer SPT Secure chat preferred   Darlin Drop 08/14/2023, 3:35 PM

## 2023-08-14 NOTE — Progress Notes (Signed)
1 Day Post-Op   Subjective/Chief Complaint: Had a lot of pain last night.  Changed to dilaudid PCA from morphine. No n/v.  Scant flatus, only occasional belching.     Objective: Vital signs in last 24 hours: Temp:  [97.5 F (36.4 C)-98.5 F (36.9 C)] 98.3 F (36.8 C) (11/20 0737) Pulse Rate:  [71-84] 76 (11/20 0737) Resp:  [16-23] 19 (11/20 0815) BP: (133-178)/(66-82) 178/78 (11/20 0737) SpO2:  [92 %-98 %] 96 % (11/20 0815) FiO2 (%):  [24 %-29 %] 24 % (11/20 0815) Last BM Date : 08/12/23  Intake/Output from previous day: 11/19 0701 - 11/20 0700 In: 3100 [I.V.:1900; IV Piggyback:1200] Out: 2410 [Urine:1825; Drains:485; Blood:100] Intake/Output this shift: No intake/output data recorded.  General appearance: alert, cooperative, and moderate distress Resp: breathing comfortably GI: soft, non distended.  Dressing c/d/I.  Lateral drain with some bile staining. Otherwise serosang Extremities: extremities normal, atraumatic, no cyanosis or edema  Lab Results:  Recent Labs    08/13/23 1655 08/14/23 0715  WBC 14.2* 15.2*  HGB 11.7* 13.2  HCT 35.9* 39.3  PLT 224 220   BMET Recent Labs    08/13/23 1245 08/13/23 1655 08/14/23 0715  NA 134*  --  132*  K 4.2  --  4.0  CL  --   --  104  CO2  --   --  20*  GLUCOSE  --   --  175*  BUN  --   --  5*  CREATININE  --  0.75 0.65  CALCIUM  --   --  8.1*   PT/INR No results for input(s): "LABPROT", "INR" in the last 72 hours. ABG Recent Labs    08/13/23 1143 08/13/23 1245  PHART 7.239* 7.299*  HCO3 21.4 19.8*    Studies/Results: No results found.  Anti-infectives: Anti-infectives (From admission, onward)    Start     Dose/Rate Route Frequency Ordered Stop   08/13/23 2000  ciprofloxacin (CIPRO) IVPB 400 mg        400 mg 200 mL/hr over 60 Minutes Intravenous Every 12 hours 08/13/23 1543 08/13/23 2100   08/13/23 0600  ciprofloxacin (CIPRO) IVPB 400 mg        400 mg 200 mL/hr over 60 Minutes Intravenous On call to  O.R. 08/13/23 0555 08/13/23 0825       Assessment/Plan: s/p Procedure(s): LAPAROSCOPY DIAGNOSTIC (N/A) HAND ASSISTED LAPAROSCOPIC SPLENECTOMY (N/A) CONVERTED TO OPEN DISTAL SUBTOTAL PANCREATECTOMY (N/A) PARTIAL GASTRECTOMY (N/A) PARTIAL SMALL BOWEL RESECTION (N/A) Continue foley until tomorrow AM IS, pulmonary toilet Pt/ot, OOB today.  Add basal rate to PCA and increase dose.  Robaxin to scheduled. Advance to full liq diet and add oxycodone to pain regimen.  TRH and Diabetes coordinator following. Await path, adenocarcinoma, clinical stage III, s/p neoadjuvant chemo.     LOS: 1 day    Almond Lint 08/14/2023

## 2023-08-14 NOTE — Consult Note (Signed)
Initial Consultation Note   Patient: Scott [name] WGN:[id number] DOB: April 10, 1951 PCP: [name], MD DOA: 08/13/2023 DOS: the patient was seen and examined on 08/14/2023 Primary service: Almond Lint, MD  Referring physician: [name], surgery Reason for consult: Diabetes management   Assessment and Plan:  Pancreatic cancer s/p whipple S/p hand-assisted lap spleen converted to open distal subtotal pancreatectomy, partial gastrectomy, and partial small bowel resection with [name] on 11/19 Management per surgery   Uncontrolled diabetes mellitus type 2, with long-term use of insulin Last hemoglobin A1c was 7.8 on 07/2023 Currently on moderate-scale SSI only On glargine 20 units daily at home Will likely have different insulin needs post-operatively and this may take a few days to decide Hold glipizide, metformin, Jardiance Diabetic education consulted Continue gabapentin  HTN Continue amlodipine, valsartan, spironolactone  Afib Rate controlled without medication Resume Eliquis when ok with surgery  HLD Continue fenofibrate, rosuvastatin  Chronic pain I have reviewed this patient in the [place] Controlled Substances Reporting System.  He is receiving medications from two providers but appears to be taking them as prescribed. He is not at particularly high risk of opioid misuse, diversion, or overdose.  Currently on Dilaudid PCA, may have more difficulty with pain control due to chronic pain and tolerance     Procedures: Distal subtotal pancreatectomy, splenectomy, partial gastrectomy, partial small bowel resection  Antibiotics: Ciprofloxacin x 2 doses  30 Day Unplanned Readmission Risk Score    Flowsheet Row Admission (Current) from 08/13/2023 in [place] [facility]  30 Day Unplanned Readmission Risk Score (%) 14.17 Filed at 08/14/2023 0801       This score is the patient's risk of an unplanned readmission within 30 days of being  discharged (0 -100%). The score is based on dignosis, age, lab data, medications, orders, and past utilization.   Low:  0-14.9   Medium: 15-21.9   High: 22-29.9   Extreme: 30 and above            TRH will continue to follow the patient.  HPI: [name] is a 72 y.o. male with past medical history of HTN, HLD, CAD, afib s/p ablation, DM, and pancreatic tail adenocarcinoma who was admitted on 11/18 for Whipple, which was performed on 11/19.  Review of Systems: As mentioned in the history of present illness. All other systems reviewed and are negative. Past Medical History:  Diagnosis Date   Arthritis    Bilateral Knees/Fingers   CAD (coronary artery disease)    Cancer (HCC) 2024   Pancreatic Cancer   Cerebrovascular disease    Diabetes mellitus without complication (HCC)    Dysrhythmia    Hx. A.Fib. Been NSR since ablation   History of kidney stones    HLD (hyperlipidemia)    HTN (hypertension)    IBS (irritable bowel syndrome)    Patient states did not have   Lumbar stenosis    MI 1996   Persistent atrial fibrillation (HCC)    PVD (peripheral vascular disease) (HCC)    Stroke (cerebrum) (HCC)    Tobacco abuse    Past Surgical History:  Procedure Laterality Date   ABDOMINAL AORTOGRAM W/LOWER EXTREMITY N/A 01/25/2017   Procedure: Abdominal Aortogram w/Lower Extremity;  Surgeon: Sherren Kerns, MD;  Location: [facility] IN[facility]ASIVE CV LAB;  Service: Cardiovascular;  Laterality: N/A;   ANTERIOR CRUCIATE LIGAMENT REPAIR     BOWEL RESECTION N/A 08/13/2023   Procedure: PARTIAL SMALL BOWEL RESECTION;  Surgeon: Almond Lint, MD;  Location: [facility] OR;  Service: General;  Laterality: N/A;   CARDIAC CATHETERIZATION     CONE   CARDIAC CATHETERIZATION N/A 07/27/2016   Procedure: Left Heart Cath and Coronary Angiography;  Surgeon: Tonny Bollman, MD;  Location: [facility] INVASIVE CV LAB;  Service: Cardiovascular;  Laterality: N/A;   CARDIOVERSION N/A 06/29/2016   Procedure: CARDIOVERSION;   Surgeon: Pricilla Riffle, MD;  Location: [facility] ENDOSCOPY;  Service: Cardiovascular;  Laterality: N/A;   CARDIOVERSION N/A 03/05/2018   Procedure: CARDIOVERSION;  Surgeon: Laurey Morale, MD;  Location: [facility] ENDOSCOPY;  Service: Cardiovascular;  Laterality: N/A;   CHOLECYSTECTOMY     ENDARTERECTOMY Right 12/06/2017   Procedure: ENDARTERECTOMY CAROTID RIGHT;  Surgeon: Nada Libman, MD;  Location: [facility] OR;  Service: Vascular;  Laterality: Right;   ESOPHAGOGASTRODUODENOSCOPY (EGD) WITH PROPOFOL N/A 05/01/2023   Procedure: ESOPHAGOGASTRODUODENOSCOPY (EGD) WITH PROPOFOL;  Surgeon: Willis Modena, MD;  Location: [facility] ENDOSCOPY;  Service: Gastroenterology;  Laterality: N/A;   FINE NEEDLE ASPIRATION N/A 05/01/2023   Procedure: FINE NEEDLE ASPIRATION (FNA) LINEAR;  Surgeon: Willis Modena, MD;  Location: [facility] ENDOSCOPY;  Service: Gastroenterology;  Laterality: N/A;   KNEE SURGERY Bilateral 1982, 1988, 1997, 2000   x 4 times.   LAPAROSCOPY N/A 08/13/2023   Procedure: LAPAROSCOPY DIAGNOSTIC;  Surgeon: Almond Lint, MD;  Location: [facility] OR;  Service: General;  Laterality: N/A;   LOWER EXTREMITY ANGIOGRAPHY N/A 07/03/2019   Procedure: LOWER EXTREMITY ANGIOGRAPHY;  Surgeon: Sherren Kerns, MD;  Location: [facility];  Service: Cardiovascular;  Laterality: N/A;   LUMBAR LAMINECTOMY/DECOMPRESSION MICRODISCECTOMY Bilateral 09/09/2018   Procedure: Laminectomy and Foraminotomy bilateral Lumbar three-Lumbar four - Lumbar four-Lumbar five;  Surgeon: Julio Sicks, MD;  Location: [facility] OR;  Service: Neurosurgery;  Laterality: Bilateral;   PARTIAL GASTRECTOMY N/A 08/13/2023   Procedure: PARTIAL GASTRECTOMY;  Surgeon: Almond Lint, MD;  Location: [facility] OR;  Service: General;  Laterality: N/A;   PATCH ANGIOPLASTY Right 12/06/2017   Procedure: PATCH ANGIOPLASTY RIGHT CAROTID ARTERY;  Surgeon: Nada Libman, MD;  Location: [facility] OR;  Service: Vascular;  Laterality: Right;   PERIPHERAL VASCULAR INTERVENTION Right 01/25/2017    Procedure: Peripheral Vascular Intervention;  Surgeon: Sherren Kerns, MD;  Location: [facility] INVASIVE CV LAB;  Service: Cardiovascular;  Laterality: Right;   PERIPHERAL VASCULAR INTERVENTION Right 07/03/2019   Procedure: PERIPHERAL VASCULAR INTERVENTION;  Surgeon: Sherren Kerns, MD;  Location: [facility];  Service: Cardiovascular;  Laterality: Right;   PORTACATH PLACEMENT Left 05/14/2023   Procedure: INSERTION PORT-A-CATH;  Surgeon: Almond Lint, MD;  Location: [facility] OR;  Service: General;  Laterality: Left;   TONSILLECTOMY     UPPER ESOPHAGEAL ENDOSCOPIC ULTRASOUND (EUS) Bilateral 05/01/2023   Procedure: UPPER ESOPHAGEAL ENDOSCOPIC ULTRASOUND (EUS);  Surgeon: Willis Modena, MD;  Location: [facility] ENDOSCOPY;  Service: Gastroenterology;  Laterality: Bilateral;   XI ROBOTIC ASSISTED LAPAROSCOPIC DISTAL PANCREATECTOMY N/A 08/13/2023   Procedure: HAND ASSISTED LAPAROSCOPIC SPLENECTOMY;  Surgeon: Almond Lint, MD;  Location: [facility] OR;  Service: General;  Laterality: N/A;   Social History:  reports that he has been smoking cigarettes. He started smoking about 52 years ago. He has a 50 pack-year smoking history. He has never used smokeless tobacco. He reports current alcohol use. He reports that he does not use drugs.  Allergies  Allergen Reactions   Augmentin [Amoxicillin-Pot Clavulanate] Rash    Family History  Problem Relation Age of Onset   Heart disease Father    Melanoma Father    Testicular cancer Father    Hypertension  Mother    Arthritis Mother    Arthritis Sister    Lung disease Neg Hx     Prior to Admission medications   Medication Sig Start Date End Date Taking? Authorizing Provider  amLODipine (NORVASC) 5 MG tablet Take 5 mg by mouth at bedtime. 11/29/22  Yes [provider]  apixaban (ELIQUIS) 5 MG TABS tablet Take 1 tablet (5 mg total) by mouth 2 (two) times daily. 07/03/23  Yes [name], [name], MD  aspirin EC 81 MG tablet Take 81 mg by mouth daily. 09/08/20  Yes  [provider]  esomeprazole (NEXIUM) 40 MG capsule Take 40 mg by mouth daily as needed (heartburn/hiccups).   Yes [provider]  fenofibrate (TRICOR) 48 MG tablet Take 48 mg by mouth daily. 01/16/22  Yes [provider]  gabapentin (NEURONTIN) 300 MG capsule Take 900 mg by mouth 2 (two) times daily. 02/03/20  Yes [provider]  glipiZIDE (GLUCOTROL XL) 5 MG 24 hr tablet Take 5 mg by mouth daily after supper. 01/09/23  Yes [provider]  Insulin Glargine (BASAGLAR KWIKPEN) 100 UNIT/ML Inject 20 Units into the skin daily.   Yes [provider]  JARDIANCE 25 MG TABS tablet Take 25 mg by mouth daily. 03/03/22  Yes [provider]  metFORMIN (GLUCOPHAGE-XR) 500 MG 24 hr tablet Take 1,000 mg by mouth daily after supper. 01/10/22  Yes [provider]  morphine (MS CONTIN) 15 MG 12 hr tablet Take 1 tablet (15 mg total) by mouth every 12 (twelve) hours. Patient taking differently: Take 15 mg by mouth at bedtime. 07/09/23  Yes [name], MD  ondansetron (ZOFRAN) 4 MG tablet Take 4 mg by mouth every 8 (eight) hours as needed for nausea or vomiting.   Yes [provider]  oxyCODONE-acetaminophen (PERCOCET/ROXICET) 5-325 MG tablet Take 1 tablet by mouth every 8 (eight) hours as needed for severe pain (pain score 7-10). 08/01/23  Yes [name], MD  polyethylene glycol (MIRALAX / GLYCOLAX) 17 g packet Take 17 g by mouth daily as needed for moderate constipation.   Yes [provider]  rosuvastatin (CRESTOR) 40 MG tablet Take 40 mg by mouth daily.   Yes [provider]  spironolactone (ALDACTONE) 25 MG tablet Take 25 mg by mouth daily.   Yes [provider]  valsartan (DIOVAN) 320 MG tablet Take 320 mg by mouth daily. 02/22/20  Yes [provider]  diazepam (VALIUM) 2 MG tablet Take 2 mg by mouth 2 (two) times daily as needed for anxiety. 02/18/20   [provider]  sildenafil (VIAGRA) 100  MG tablet Take 100 mg by mouth as needed for erectile dysfunction. 04/14/18   [provider]    Physical Exam: Vitals:   08/14/23 0000 08/14/23 0319 08/14/23 0400 08/14/23 0737  BP:  (!) 160/81  (!) 178/78  Pulse:  73  76  Resp: 18 20 (!) 22 19  Temp:  (!) 97.5 F (36.4 C)  98.3 F (36.8 C)  TempSrc:  Oral  Oral  SpO2: 95% 96% 96% 96%  Weight:      Height:         Subjective: Reports significant abdominal wall pain, difficulty coughing up secretions due to abdominal wall pain.   Objective: Vitals:   08/14/23 0400 08/14/23 0737  BP:  (!) 178/78  Pulse:  76  Resp: (!) 22 19  Temp:  98.3 F (36.8 C)  SpO2: 96% 96%    Intake/Output Summary (Last 24  hours) at 08/14/2023 0806 Last data filed at 08/14/2023 0600 Gross per 24 hour  Intake 3100 ml  Output 2410 ml  Net 690 ml   Filed Weights   08/13/23 0552  Weight: 65.4 kg    Exam:  General:  Appears ill with pain, on PCA Eyes:  EOMI, normal lids, iris ENT:  grossly normal hearing, lips & tongue, mmm Neck:  no LAD, masses or thyromegaly Cardiovascular:  RRR, no m/r/g. No LE edema.  Respiratory:   CTA bilaterally with no wheezes/rales/rhonchi.  Normal respiratory effort. Abdomen:  abdominal dressing and drains in place, pain with cough/splinting appreciated Skin:  no rash or induration seen on limited exam Musculoskeletal:  grossly normal tone BUE/BLE, good ROM, no bony abnormality Psychiatric:  blunted mood and affect, speech fluent and appropriate, AOx3 Neurologic:  CN 2-12 grossly intact, moves all extremities in coordinated fashion  Data Reviewed: I have reviewed the patient's lab results since admission.  Pertinent labs for today include:  CMP pending Glucose 135-228 WBC 15.2     Family Communication: Wife and another family member were present throughout evaluation Primary team communication: None today Thank you very much for involving [place] in the care of your patient.  Author: Jonah Blue, MD 08/14/2023 8:05 AM  For on call review [email or web address].

## 2023-08-14 NOTE — Inpatient Diabetes Management (Signed)
Inpatient Diabetes Program Recommendations  AACE/ADA: New Consensus Statement on Inpatient Glycemic Control (2015)  Target Ranges:  Prepandial:   less than 140 mg/dL      Peak postprandial:   less than 180 mg/dL (1-2 hours)      Critically ill patients:  140 - 180 mg/dL   Lab Results  Component Value Date   GLUCAP 186 (H) 08/14/2023   HGBA1C 7.8 (H) 08/08/2023    Review of Glycemic Control  Latest Reference Range & Units 08/13/23 14:16 08/13/23 20:27 08/13/23 23:03 08/14/23 03:18 08/14/23 07:35  Glucose-Capillary 70 - 99 mg/dL 578 (H) 469 (H) 629 (H) 135 (H) 186 (H)   Diabetes history: DM  Outpatient Diabetes medications:  Glucotrol XL 5 mg daily Basaglar 20 units daily Jardiance 25 mg daily Metformin 1000 mg daily Current orders for Inpatient glycemic control:  Novolog 0-15 units q 4 hours  Inpatient Diabetes Program Recommendations:    Spoke briefly with patient and wife.  He reports pain and MD in to see him during my visit.  He does have FS libre 3 that he wears at home and has been taking insulin (basal) daily as well.  Will give him and wife sample for FS libre for restart at discharge.   Recommend restarting 1/2 of home basal insulin.  Consider adding Semglee 10 units daily.   Thanks,  Beryl Meager, RN, BC-ADM Inpatient Diabetes Coordinator Pager [phone]  (8a-5p)

## 2023-08-15 ENCOUNTER — Encounter: Payer: Self-pay | Admitting: Anesthesiology

## 2023-08-15 ENCOUNTER — Other Ambulatory Visit: Payer: Self-pay

## 2023-08-15 DIAGNOSIS — C252 Malignant neoplasm of tail of pancreas: Secondary | ICD-10-CM | POA: Diagnosis not present

## 2023-08-15 LAB — COMPREHENSIVE METABOLIC PANEL WITH GFR
ALT: 116 U/L — ABNORMAL HIGH (ref 0–44)
AST: 86 U/L — ABNORMAL HIGH (ref 15–41)
Albumin: 2.8 g/dL — ABNORMAL LOW (ref 3.5–5.0)
Alkaline Phosphatase: 57 U/L (ref 38–126)
Anion gap: 6 (ref 5–15)
BUN: 6 mg/dL — ABNORMAL LOW (ref 8–23)
CO2: 25 mmol/L (ref 22–32)
Calcium: 8.5 mg/dL — ABNORMAL LOW (ref 8.9–10.3)
Chloride: 103 mmol/L (ref 98–111)
Creatinine, Ser: 0.78 mg/dL (ref 0.61–1.24)
GFR, Estimated: >60 mL/min
Glucose, Bld: 175 mg/dL — ABNORMAL HIGH (ref 70–99)
Potassium: 4 mmol/L (ref 3.5–5.1)
Sodium: 134 mmol/L — ABNORMAL LOW (ref 135–145)
Total Bilirubin: 1 mg/dL
Total Protein: 5.3 g/dL — ABNORMAL LOW (ref 6.5–8.1)

## 2023-08-15 LAB — CBC
HCT: 37.8 % — ABNORMAL LOW (ref 39.0–52.0)
Hemoglobin: 12.4 g/dL — ABNORMAL LOW (ref 13.0–17.0)
MCH: 29.5 pg (ref 26.0–34.0)
MCHC: 32.8 g/dL (ref 30.0–36.0)
MCV: 90 fL (ref 80.0–100.0)
Platelets: 209 K/uL (ref 150–400)
RBC: 4.2 MIL/uL — ABNORMAL LOW (ref 4.22–5.81)
RDW: 15 % (ref 11.5–15.5)
WBC: 16.7 K/uL — ABNORMAL HIGH (ref 4.0–10.5)
nRBC: 0 % (ref 0.0–0.2)

## 2023-08-15 LAB — GLUCOSE, CAPILLARY
Glucose-Capillary: 147 mg/dL — ABNORMAL HIGH (ref 70–99)
Glucose-Capillary: 176 mg/dL — ABNORMAL HIGH (ref 70–99)
Glucose-Capillary: 267 mg/dL — ABNORMAL HIGH (ref 70–99)
Glucose-Capillary: 239 mg/dL — ABNORMAL HIGH (ref 70–99)
Glucose-Capillary: 149 mg/dL — ABNORMAL HIGH (ref 70–99)
Glucose-Capillary: 163 mg/dL — ABNORMAL HIGH (ref 70–99)

## 2023-08-15 LAB — BPAM RBC
Blood Product Expiration Date: 202412132359
Blood Product Expiration Date: 202412132359
ISSUE DATE / TIME: 202411191055
ISSUE DATE / TIME: 202411191055
Unit Type and Rh: 5100
Unit Type and Rh: 5100

## 2023-08-15 LAB — TYPE AND SCREEN
ABO/RH(D): O POS
Antibody Screen: NEGATIVE
Unit division: 0
Unit division: 0

## 2023-08-15 LAB — PHOSPHORUS: Phosphorus: 2.5 mg/dL (ref 2.5–4.6)

## 2023-08-15 LAB — MAGNESIUM: Magnesium: 1.5 mg/dL — ABNORMAL LOW (ref 1.7–2.4)

## 2023-08-15 MED ORDER — INSULIN GLARGINE-YFGN 100 UNIT/ML ~~LOC~~ SOLN
5.0000 [IU] | Freq: Every day | SUBCUTANEOUS | Status: DC
  Administered 2023-08-15 – 2023-08-16 (×2): 5 [IU] via SUBCUTANEOUS
  Filled 2023-08-15 (×2): qty 0.05

## 2023-08-15 NOTE — Evaluation (Signed)
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 PMH: DM, GERD, HLD, HTN, CA, Afib   Clinical Impression   Patient is s/p hand-assisted lap spleen converted to open distal subtotal pancreatectomy, partial gastrectomy, and partial small bowel resection  surgery resulting in functional limitations due to the deficits listed below (see OT Problem List). Prior to surgery, pt was living with wife independent with all ADL tasks and functional mobility. Based on pt's pain and functional limitations sustained from surgery, pt requires increased assistance to complete BADL tasks and functional transfers. Pt would benefit from home health OT services once discharged to further improve his overall independence with daily tasks.  Patient will benefit from acute skilled OT to increase their safety and independence with ADL and functional mobility for ADL to allow facilitate discharge. OT will continue to follow patiently acutely.   Patient is confined to a single room and not able to walk the distance required to go the bathroom, or he/she is unable to safely negotiate stairs required to access the bathroom.  A 3in1 BSC will alleviate this problem         If plan is discharge home, recommend the following: A little help with walking and/or transfers;A lot of help with bathing/dressing/bathroom;Assist for transportation;Help with stairs or ramp for entrance    Functional Status Assessment  Patient has had a recent decline in their functional status and demonstrates the ability to make significant improvements in function in a reasonable and predictable amount of time.   Equipment Recommendations  BSC/3in1       Precautions / Restrictions Precautions Precautions: Fall Precaution Comments: JP drain x2 L abdomen, PCA Restrictions Weight Bearing Restrictions: No      Mobility Bed Mobility Overal bed mobility: Needs Assistance Bed Mobility: Supine to Sit     Supine to sit: Min assist Sit to supine: HOB elevated, Used rails   General bed mobility comments: Pt educatied on modified bed transition while rolling towards her Left side holding small pillow against abdomen. VC to utilize bed rail.    Transfers Overall transfer level: Needs assistance Equipment used: Rolling walker (2 wheels) Transfers: Sit to/from Stand, Bed to chair/wheelchair/BSC Sit to Stand: Min assist, From elevated surface     Step pivot transfers: Min assist     General transfer comment: VC for hand placement with RW management prior to sit to stand transition. Once standing, pt required VC to self correct weight shift due to posterior bias. Required MIn A to maintain balance.      Balance Overall balance assessment: Needs assistance Sitting-balance support: Bilateral upper extremity supported, Feet supported Sitting balance-Leahy Scale: Poor Sitting balance - Comments: Due to decreased core strength and stability s/p surgery, pt was limited by pain when attemptng to maintain sitting balance requiring physical assist and VC. Postural control: Posterior lean Standing balance support: Bilateral upper extremity supported, During functional activity, Reliant on assistive device for balance Standing balance-Leahy Scale: Poor Standing balance comment: unable to maintain standing balance without holding RW              ADL either performed or assessed with clinical judgement   ADL Overall ADL's : Needs assistance/impaired     Grooming: Oral  care;Set up;Sitting   Upper Body Bathing: Set up;Sitting   Lower Body Bathing: Total assistance;Sitting/lateral leans;Sit  to/from stand   Upper Body Dressing : Minimal assistance;Sitting   Lower Body Dressing: Total assistance;Bed level   Toilet Transfer: Minimal assistance;Stand-pivot;Ambulation;BSC/3in1   Toileting- Clothing Manipulation and Hygiene: Maximal assistance;Cueing for safety;Sitting/lateral lean               Vision Baseline Vision/History: 0 No visual deficits Ability to See in Adequate Light: 0 Adequate Vision Assessment?: No apparent visual deficits     Perception Perception: Within Functional Limits       Praxis Praxis: WFL       Pertinent Vitals/Pain Pain Assessment Pain Assessment: Faces Faces Pain Scale: Hurts even more Pain Location: abdomen at surgical site Pain Descriptors / Indicators: Sharp, Guarding, Shooting, Discomfort, Grimacing Pain Intervention(s): Monitored during session, Limited activity within patient's tolerance, Repositioned     Extremity/Trunk Assessment Upper Extremity Assessment Upper Extremity Assessment: Generalized weakness   Lower Extremity Assessment Lower Extremity Assessment: Defer to PT evaluation   Cervical / Trunk Assessment Cervical / Trunk Assessment: Kyphotic;Other exceptions Cervical / Trunk Exceptions: Limited trunk mobility due to recent surgery   Communication Communication Communication: No apparent difficulties Cueing Techniques: Verbal cues   Cognition Arousal: Alert Behavior During Therapy: WFL for tasks assessed/performed Overall Cognitive Status: Within Functional Limits for tasks assessed                                       General Comments  HR 104-108 during activity. One JP drain leaking through seal. Nursing informed and seal was reapplied while pt was sitting EOB.    Exercises     Shoulder Instructions      Home Living Family/patient expects to be discharged to:: Private residence Living Arrangements: Spouse/significant other Available Help at Discharge: Available 24  hours/day;Family Type of Home: House Home Access: Stairs to enter [employer] of Steps: 2   Home Layout: Able to live on main level with bedroom/bathroom;Other (Comment);Two level (basement, does not need to go to often)     Bathroom Shower/Tub: Tub/shower unit;Walk-in shower   Bathroom Toilet: Standard Bathroom Accessibility: Yes   Home Equipment: Shower seat - built Charity fundraiser (2 wheels);Hand held shower head   Additional Comments: one dog and two cats      Prior Functioning/Environment Prior Level of Function : Independent/Modified Independent                        OT Problem List: Decreased strength;Decreased activity tolerance;Impaired balance (sitting and/or standing);Decreased knowledge of use of DME or AE;Pain      OT Treatment/Interventions: Self-care/ADL training;Therapeutic exercise;Energy conservation;DME and/or AE instruction;Manual therapy;Modalities;Therapeutic activities;Patient/family education;Balance training    OT Goals(Current goals can be found in the care plan section) Acute Rehab OT Goals Patient Stated Goal: to sit in recliner OT Goal Formulation: Patient unable to participate in goal setting Time For Goal Achievement: 08/29/23 Potential to Achieve Goals: Good  OT Frequency: Min 1X/week       AM-PAC OT "6 Clicks" Daily Activity     Outcome Measure Help from another person eating meals?: None Help from another person taking care of personal grooming?: A Little Help from another person toileting, which includes using toliet, bedpan, or urinal?: A Lot Help from another person bathing (including washing, rinsing, drying)?: A Lot Help from another person to put  on and taking off regular upper body clothing?: A Little Help from another person to put on and taking off regular lower body clothing?: Total 6 Click Score: 15   End of Session Equipment Utilized During Treatment: Gait belt;Rolling walker (2 wheels) Nurse  Communication: Mobility status  Activity Tolerance: Patient tolerated treatment well;Patient limited by pain Patient left: in chair;with call bell/phone within reach;with chair alarm set;with family/visitor present  OT Visit Diagnosis: Unsteadiness on feet (R26.81);Muscle weakness (generalized) (M62.81);Pain Pain - Right/Left: Left Pain - part of body:  (flank)                Time: 1610-9604 OT Time Calculation (min): 51 min Charges:  OT General Charges $OT Visit: 1 Visit OT Evaluation $OT Eval High Complexity: 1 High OT Treatments $Self Care/Home Management : 8-22 mins $Therapeutic Activity: 8-22 mins  Limmie Patricia, OTR/L,CBIS  Supplemental OT - [facility] Secure Chat Preferred    Nori Winegar, Charisse March 08/15/2023, 7:18 PM

## 2023-08-15 NOTE — Progress Notes (Signed)
   08/15/23 1118  TOC Brief Assessment  Insurance and Status Reviewed  Patient has primary care physician Yes  Home environment has been reviewed home w/ spouse  Prior level of function: self  Prior/Current Home Services No current home services  Social Determinants of Health Reivew SDOH reviewed no interventions necessary  Readmission risk has been reviewed Yes  Transition of care needs no transition of care needs at this time     Pt s/p whipple, from home w/ spouse- not anticipated needs- We will continue to monitor patient advancement through interdisciplinary progression rounds. If new patient transition needs arise, please place a TOC consult.

## 2023-08-15 NOTE — Progress Notes (Signed)
JP1 drain started leaking while pt was working with OT around noon. This RN changed the biopatch + dressing, and reinforced it with tape. Output of 40 excluding unmeasured leakage. JP drain stopped leaking until this evening as pt was moving around in bed. This RN called [facility] to let MD aware of leaking JP1 drain despite interventions. Output of 29 excluding unmeasured leakage. Waiting for paged on-call MD response.

## 2023-08-15 NOTE — Consult Note (Signed)
Initial Consultation Note   Patient: Scott [name] XFG:[id number] DOB: Apr 22, 1951 PCP: [name], MD DOA: 08/13/2023 DOS: the patient was seen and examined on 08/15/2023 Primary service: Almond Lint, MD  Referring physician: [name], surgery Reason for consult: Diabetes management    Assessment and Plan:  Pancreatic cancer s/p whipple S/p hand-assisted lap spleen converted to open distal subtotal pancreatectomy, partial gastrectomy, and partial small bowel resection with [name] on 11/19 Management per surgery Pain is much better controlled today   Uncontrolled diabetes mellitus type 2, with long-term use of insulin Last hemoglobin A1c was 7.8 on 07/2023 Currently on moderate-scale SSI only On glargine 20 units daily at home Will likely have different insulin needs post-operatively and this may take a few days to decide Hold glipizide, metformin, Jardiance Diabetic education consulted Continue gabapentin Add back 5 units of glargine today   HTN Continue amlodipine, valsartan, spironolactone   Afib Rate controlled without medication Resume Eliquis when ok with surgery   HLD Continue fenofibrate, rosuvastatin   Chronic pain I have reviewed this patient in the [place] Controlled Substances Reporting System.  He is receiving medications from two providers but appears to be taking them as prescribed. He is not at particularly high risk of opioid misuse, diversion, or overdose.  Currently on Dilaudid PCA, may have more difficulty with pain control due to chronic pain and tolerance         Procedures: Distal subtotal pancreatectomy, splenectomy, partial gastrectomy, partial small bowel resection   Antibiotics: Ciprofloxacin x 2 doses        TRH will continue to follow the patient.  HPI: Scott [name] is a 72 y.o. male with past medical history of  HTN, HLD, CAD, afib s/p ablation, DM, and pancreatic tail adenocarcinoma who was admitted on 11/18 for  Whipple, which was performed on 11/19.    Review of Systems: As mentioned in the history of present illness. All other systems reviewed and are negative. Past Medical History:  Diagnosis Date   Arthritis    Bilateral Knees/Fingers   CAD (coronary artery disease)    Cancer (HCC) 2024   Pancreatic Cancer   Cerebrovascular disease    Diabetes mellitus without complication (HCC)    Dysrhythmia    Hx. A.Fib. Been NSR since ablation   History of kidney stones    HLD (hyperlipidemia)    HTN (hypertension)    IBS (irritable bowel syndrome)    Patient states did not have   Lumbar stenosis    MI 1996   Persistent atrial fibrillation (HCC)    PVD (peripheral vascular disease) (HCC)    Stroke (cerebrum) (HCC)    Tobacco abuse    Past Surgical History:  Procedure Laterality Date   ABDOMINAL AORTOGRAM W/LOWER EXTREMITY N/A 01/25/2017   Procedure: Abdominal Aortogram w/Lower Extremity;  Surgeon: Sherren Kerns, MD;  [facility]: [facility] INV[facility];  Service: Cardiovascular;  Laterality: N/A;   ANTERIOR CRUCIATE LIGAMENT REPAIR     BOWEL RESECTION N/A 08/13/2023   Procedure: PARTIAL SMALL BOWEL RESECTION;  Surgeon: Almond Lint, MD;  Location: [facility] OR;  Service: General;  Laterality: N/A;   CARDIAC CATHETERIZATION     CONE   CARDIAC CATHETERIZATION N/A 07/27/2016   Procedure: Left Heart Cath and Coronary Angiography;  Surgeon: Tonny Bollman, MD;  [facility] INVASIVE CV LAB;  Service: Cardiovascular;  Laterality: N/A;   CARDIOVERSION N/A 06/29/2016   Procedure: CARDIOVERSION;  Surgeon: Pricilla Riffle, MD;  Location: [facility] ENDOSCOPY;  Service:  Cardiovascular;  Laterality: N/A;   CARDIOVERSION N/A 03/05/2018   Procedure: CARDIOVERSION;  Surgeon: Laurey Morale, MD;  Location: [facility] ENDOSCOPY;  Service: Cardiovascular;  Laterality: N/A;   CHOLECYSTECTOMY     ENDARTERECTOMY Right 12/06/2017   Procedure: ENDARTERECTOMY CAROTID RIGHT;  Surgeon: Nada Libman, MD;  [facility]: [facility];  Service:  Vascular;  Laterality: Right;   ESOPHAGOGASTRODUODENOSCOPY (EGD) WITH PROPOFOL N/A 05/01/2023   Procedure: ESOPHAGOGASTRODUODENOSCOPY (EGD) WITH PROPOFOL;  Surgeon: Willis Modena, MD;  Location: [facility] ENDOSCOPY;  Service: Gastroenterology;  Laterality: N/A;   FINE NEEDLE ASPIRATION N/A 05/01/2023   Procedure: FINE NEEDLE ASPIRATION (FNA) LINEAR;  Surgeon: Willis Modena, MD;  Location: [facility] ENDOSCOPY;  Service: Gastroenterology;  Laterality: N/A;   KNEE SURGERY Bilateral 1982, 1988, 1997, 2000   x 4 times.   LAPAROSCOPY N/A 08/13/2023   Procedure: LAPAROSCOPY DIAGNOSTIC;  Surgeon: Almond Lint, MD;  [facility];  Service: General;  Laterality: N/A;   LOWER EXTREMITY ANGIOGRAPHY N/A 07/03/2019   Procedure: LOWER EXTREMITY ANGIOGRAPHY;  Surgeon: Sherren Kerns, MD;  [facility];  Service: Cardiovascular;  Laterality: N/A;   LUMBAR LAMINECTOMY/DECOMPRESSION MICRODISCECTOMY Bilateral 09/09/2018   Procedure: Laminectomy and Foraminotomy bilateral Lumbar three-Lumbar four - Lumbar four-Lumbar five;  Surgeon: Julio Sicks, MD;  Location[facility];  Service: Neurosurgery;  Laterality: Bilateral;   PARTIAL GASTRECTOMY N/A 08/13/2023   Procedure: PARTIAL GASTRECTOMY;  Surgeon: Almond Lint, MD;  [facility];  Service: General;  Laterality: N/A;   PATCH ANGIOPLASTY Right 12/06/2017   Procedure: PATCH ANGIOPLASTY RIGHT CAROTID ARTERY;  Surgeon: Nada Libman, MD;  [facility];  Service: Vascular;  Laterality: Right;   PERIPHERAL VASCULAR INTERVENTION Right 01/25/2017   Procedure: Peripheral Vascular Intervention;  Surgeon: Sherren Kerns, MD;  Location[facility] INVASIVE CV LAB;  Service: Cardiovascular;  Laterality: Right;   PERIPHERAL VASCULAR INTERVENTION Right 07/03/2019   Procedure: PERIPHERAL VASCULAR INTERVENTION;  Surgeon: Sherren Kerns, MD;  [facility];  Service: Cardiovascular;  Laterality: Right;   PORTACATH PLACEMENT Left 05/14/2023   Procedure:  INSERTION PORT-A-CATH;  Surgeon: Almond Lint, MD;  Location[facility];  Service: General;  Laterality: Left;   TONSILLECTOMY     UPPER ESOPHAGEAL ENDOSCOPIC ULTRASOUND (EUS) Bilateral 05/01/2023   Procedure: UPPER ESOPHAGEAL ENDOSCOPIC ULTRASOUND (EUS);  Surgeon: Willis Modena, MD;  Location: [facility] ENDOSCOPY;  Service: Gastroenterology;  Laterality: Bilateral;   XI ROBOTIC ASSISTED LAPAROSCOPIC DISTAL PANCREATECTOMY N/A 08/13/2023   Procedure: HAND ASSISTED LAPAROSCOPIC SPLENECTOMY;  Surgeon: Almond Lint, MD;  Location: [facility];  Service: General;  Laterality: N/A;   Social History:  reports that he has been smoking cigarettes. He started smoking about 52 years ago. He has a 50 pack-year smoking history. He has never used smokeless tobacco. He reports current alcohol use. He reports that he does not use drugs.  Allergies  Allergen Reactions   Augmentin [Amoxicillin-Pot Clavulanate] Rash    Family History  Problem Relation Age of Onset   Heart disease Father    Melanoma Father    Testicular cancer Father    Hypertension Mother    Arthritis Mother    Arthritis Sister    Lung disease Neg Hx     Prior to Admission medications   Medication Sig Start Date End Date Taking? Authorizing Provider  amLODipine (NORVASC) 5 MG tablet Take 5 mg by mouth at bedtime. 11/29/22  Yes [provider]  apixaban (ELIQUIS) 5 MG TABS tablet Take 1 tablet (5 mg total) by mouth  2 (two) times daily. 07/03/23  Yes [name], [name], MD  aspirin EC 81 MG tablet Take 81 mg by mouth daily. 09/08/20  Yes [provider]  esomeprazole (NEXIUM) 40 MG capsule Take 40 mg by mouth daily as needed (heartburn/hiccups).   Yes [provider]  fenofibrate (TRICOR) 48 MG tablet Take 48 mg by mouth daily. 01/16/22  Yes [provider]  gabapentin (NEURONTIN) 300 MG capsule Take 900 mg by mouth 2 (two) times daily. 02/03/20  Yes [provider]  glipiZIDE (GLUCOTROL XL) 5 MG 24 hr tablet  Take 5 mg by mouth daily after supper. 01/09/23  Yes [provider]  Insulin Glargine (BASAGLAR KWIKPEN) 100 UNIT/ML Inject 20 Units into the skin daily.   Yes [provider]  JARDIANCE 25 MG TABS tablet Take 25 mg by mouth daily. 03/03/22  Yes [provider]  metFORMIN (GLUCOPHAGE-XR) 500 MG 24 hr tablet Take 1,000 mg by mouth daily after supper. 01/10/22  Yes [provider]  morphine (MS CONTIN) 15 MG 12 hr tablet Take 1 tablet (15 mg total) by mouth every 12 (twelve) hours. Patient taking differently: Take 15 mg by mouth at bedtime. 07/09/23  Yes [name], MD  ondansetron (ZOFRAN) 4 MG tablet Take 4 mg by mouth every 8 (eight) hours as needed for nausea or vomiting.   Yes [provider]  oxyCODONE-acetaminophen (PERCOCET/ROXICET) 5-325 MG tablet Take 1 tablet by mouth every 8 (eight) hours as needed for severe pain (pain score 7-10). 08/01/23  Yes [name], MD  polyethylene glycol (MIRALAX / GLYCOLAX) 17 g packet Take 17 g by mouth daily as needed for moderate constipation.   Yes [provider]  rosuvastatin (CRESTOR) 40 MG tablet Take 40 mg by mouth daily.   Yes [provider]  spironolactone (ALDACTONE) 25 MG tablet Take 25 mg by mouth daily.   Yes [provider]  valsartan (DIOVAN) 320 MG tablet Take 320 mg by mouth daily. 02/22/20  Yes [provider]  diazepam (VALIUM) 2 MG tablet Take 2 mg by mouth 2 (two) times daily as needed for anxiety. 02/18/20   [provider]  sildenafil (VIAGRA) 100 MG tablet Take 100 mg by mouth as needed for erectile dysfunction. 04/14/18   [provider]    Physical Exam: Vitals:   08/14/23 2318 08/14/23 2356 08/15/23 0305 08/15/23 0306  BP: (!) 184/98 (!) 163/70 (!) 144/89   Pulse: 82 87 90   Resp: 16 18 18 16   Temp: 100.1 F (37.8 C)  (!) 100.4 F (38 C)   TempSrc: Oral  Oral   SpO2: 98% 96% 95% 94%  Weight:      Height:         Subjective:  Feels much better today.  Pain is better controlled.  Happy to have Ensure - reports he drinks 2 of them daily at breakfast at home.  Glucose in 130-170 range with SSI of 14 units yesterday.   Intake/Output Summary (Last 24 hours) at 08/15/2023 0757 Last data filed at 08/15/2023 0700 Gross per 24 hour  Intake 662.4 ml  Output 1900 ml  Net -1237.6 ml   Filed Weights   08/13/23 0552  Weight: 65.4 kg    Exam:  General:  Appears calm and comfortable and is in NAD, feels much better today Eyes:   EOMI, normal lids, iris ENT:  grossly normal hearing, lips & tongue, mmm Neck:  no LAD, masses or thyromegaly Cardiovascular:  RRR, no  m/r/g. No LE edema.  Respiratory:   CTA bilaterally with no wheezes/rales/rhonchi.  Normal respiratory effort. Abdomen:  midline bandage C/D/I Skin:  no rash or induration seen on limited exam Musculoskeletal:  grossly normal tone BUE/BLE, good ROM, no bony abnormality Psychiatric:  grossly normal mood and affect, speech fluent and appropriate, AOx3 Neurologic:  CN 2-12 grossly intact, moves all extremities in coordinated fashion  Data Reviewed: I have reviewed the patient's lab results since admission.  Pertinent labs for today include:  Na++ 134 Glucose 175 Mag++ 1.5 AST 86/ALT 116; 100/103 on 11/20 WBC 16.7 Hgb 12.4    Family Communication: None present today Primary team communication: None today Thank you very much for involving [place] in the care of your patient.  Author: Jonah Blue, MD 08/15/2023 7:56 AM  For on call review [email or web address].

## 2023-08-15 NOTE — Plan of Care (Signed)

## 2023-08-15 NOTE — Progress Notes (Signed)
2 mg dilaudid PCA wasted in stericycle with Antionette Fairy as witness.

## 2023-08-15 NOTE — Progress Notes (Signed)
2 Days Post-Op   Subjective/Chief Complaint: Pain is improved today.  Placed on basal rate on PCA.  Did standing robaxin.  No n/v. Tolerated fulls.  Not really hungry. No real flatus yet.     Objective: Vital signs in last 24 hours: Temp:  [98.2 F (36.8 C)-100.4 F (38 C)] 98.5 F (36.9 C) (11/21 0804) Pulse Rate:  [82-90] 87 (11/21 0804) Resp:  [13-22] 18 (11/21 0819) BP: (119-184)/(70-98) 119/83 (11/21 0804) SpO2:  [92 %-98 %] 97 % (11/21 0819) FiO2 (%):  [24 %] 24 % (11/21 0819) Last BM Date : 08/12/23  Intake/Output from previous day: 11/20 0701 - 11/21 0700 In: 662.4 [I.V.:662.4] Out: 1900 [Urine:1475; Drains:425] Intake/Output this shift: No intake/output data recorded.  General appearance: alert, cooperative, no distress Resp: breathing comfortably GI: soft, non distended.  Dressing c/d/I.  Lateral drain less bile staining. [place].  Extremities: extremities normal, atraumatic, no cyanosis or edema  Lab Results:  Recent Labs    08/14/23 0715 08/15/23 0701  WBC 15.2* 16.7*  HGB 13.2 12.4*  HCT 39.3 37.8*  PLT 220 209   BMET Recent Labs    08/14/23 0715 08/15/23 0701  NA 132* 134*  K 4.0 4.0  CL 104 103  CO2 20* 25  GLUCOSE 175* 175*  BUN 5* 6*  CREATININE 0.65 0.78  CALCIUM 8.1* 8.5*   PT/INR No results for input(s): "LABPROT", "INR" in the last 72 hours. ABG Recent Labs    08/13/23 1143 08/13/23 1245  PHART 7.239* 7.299*  HCO3 21.4 19.8*    Studies/Results: No results found.  Anti-infectives: Anti-infectives (From admission, onward)    Start     Dose/Rate Route Frequency Ordered Stop   08/13/23 2000  ciprofloxacin (CIPRO) IVPB 400 mg        400 mg 200 mL/hr over 60 Minutes Intravenous Every 12 hours 08/13/23 1543 08/13/23 2100   08/13/23 0600  ciprofloxacin (CIPRO) IVPB 400 mg        400 mg 200 mL/hr over 60 Minutes Intravenous On call to O.R. 08/13/23 0555 08/13/23 0825       Assessment/Plan: s/p Procedure(s): LAPAROSCOPY  DIAGNOSTIC (N/A) HAND ASSISTED LAPAROSCOPIC SPLENECTOMY (N/A) CONVERTED TO OPEN DISTAL SUBTOTAL PANCREATECTOMY (N/A) PARTIAL GASTRECTOMY (N/A) PARTIAL SMALL BOWEL RESECTION (N/A) D/c foley IS, pulmonary toilet. Doing IS to 1000 - 1200 mL. Pt/ot, OOB again today.  Keep pca Full liquid diet with occasional cracker/cookie.  Will do soft diet in AM.    TRH and Diabetes coordinator following for DM.  Await path, adenocarcinoma, clinical stage III, s/p neoadjuvant chemo.     LOS: 2 days    Almond Lint 08/15/2023

## 2023-08-15 NOTE — Progress Notes (Signed)
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 PMH: DM, GERD, HLD, HTN, CA, Afib    PT Comments  Pt reporting moderate abdominal pain, fairly constant but appears to ease with continued gait. Pt ambulating great hallway distance with use of RW and close guard, requires occasional cues for form/safety. SPO2 90% and greater on RA, replaced 2LO2 at end of session given PCA. PT to continue to follow.       If plan is discharge home, recommend the following: A little help with walking and/or transfers;A little help with bathing/dressing/bathroom;Assistance with cooking/housework;Help with stairs or ramp for entrance   Can travel by private vehicle        Equipment Recommendations  None recommended by PT    Recommendations for Other Services OT consult     Precautions / Restrictions Precautions Precautions: Fall Precaution Comments: JP drain x2 L abdomen, PCA Restrictions Weight Bearing Restrictions: No     Mobility  Bed Mobility Overal bed mobility: Needs Assistance Bed Mobility: Supine to Sit, Sit to Supine       Sit to supine: Min assist   General bed mobility comments: assist for LE lift into bed, PT cuing log roll but not performed    Transfers Overall transfer level: Needs assistance Equipment used: Rolling walker (2 wheels) Transfers: Sit to/from Stand Sit to Stand: Min assist           General transfer comment: assist for rise and steady, stand x2 from EOB    Ambulation/Gait Ambulation/Gait assistance: Min assist Gait Distance (Feet): 350 Feet Assistive device: Rolling walker (2 wheels) Gait Pattern/deviations: Step-through pattern, Decreased stride length, Trunk flexed Gait velocity: decr     General Gait Details: Mod stomach  pain with upright posture, cues for RW use and hallway navigation   Stairs             Wheelchair Mobility     Tilt Bed    Modified Rankin (Stroke Patients Only)       Balance Overall balance assessment: Needs assistance Sitting-balance support: No upper extremity supported, Feet unsupported Sitting balance-Leahy Scale: Good     Standing balance support: Bilateral upper extremity supported, Single extremity supported, During functional activity Standing balance-Leahy Scale: Fair Standing balance comment: RW for dynamic activity                            Cognition Arousal: Alert Behavior During Therapy: WFL for tasks assessed/performed Overall Cognitive Status: Within Functional Limits for tasks assessed                                          Exercises      General Comments        Pertinent Vitals/Pain Pain Assessment Pain Assessment: 0-10 Pain Score: 4  Pain Location: abdomen at surgical site Pain Descriptors / Indicators: Sharp, Guarding, Shooting, Discomfort, Grimacing Pain Intervention(s): Limited activity within patient's tolerance, Repositioned, Monitored during session    Home Living                          Prior Function  PT Goals (current goals can now be found in the care plan section) Acute Rehab PT Goals Patient Stated Goal: return home PT Goal Formulation: With patient Time For Goal Achievement: 08/28/23 Potential to Achieve Goals: Good Progress towards PT goals: Progressing toward goals    Frequency    Min 1X/week      PT Plan      Co-evaluation              AM-PAC PT "6 Clicks" Mobility   Outcome Measure  Help needed turning from your back to your side while in a flat bed without using bedrails?: A Little Help needed moving from lying on your back to sitting on the side of a flat bed without using bedrails?: A Little Help needed moving to and from a bed to a  chair (including a wheelchair)?: A Little Help needed standing up from a chair using your arms (e.g., wheelchair or bedside chair)?: A Little Help needed to walk in hospital room?: A Little Help needed climbing 3-5 steps with a railing? : A Little 6 Click Score: 18    End of Session   Activity Tolerance: Patient tolerated treatment well Patient left: in bed;with call bell/phone within reach;with bed alarm set Nurse Communication: Mobility status PT Visit Diagnosis: Unsteadiness on feet (R26.81);Other abnormalities of gait and mobility (R26.89);Muscle weakness (generalized) (M62.81)     Time: 1445-1510 PT Time Calculation (min) (ACUTE ONLY): 25 min  Charges:    $Gait Training: 8-22 mins $Therapeutic Activity: 8-22 mins PT General Charges $$ ACUTE PT VISIT: 1 Visit                     Scott [name], PT DPT Acute Rehabilitation Services Secure Chat Preferred  Office [phone]    Scott [name] 08/15/2023, 3:42 PM

## 2023-08-16 DIAGNOSIS — C252 Malignant neoplasm of tail of pancreas: Secondary | ICD-10-CM | POA: Diagnosis not present

## 2023-08-16 LAB — CBC
HCT: 36.7 % — ABNORMAL LOW (ref 39.0–52.0)
Hemoglobin: 11.9 g/dL — ABNORMAL LOW (ref 13.0–17.0)
MCH: 29.5 pg (ref 26.0–34.0)
MCHC: 32.4 g/dL (ref 30.0–36.0)
MCV: 90.8 fL (ref 80.0–100.0)
Platelets: 215 10*3/uL (ref 150–400)
RBC: 4.04 MIL/uL — ABNORMAL LOW (ref 4.22–5.81)
RDW: 15.2 % (ref 11.5–15.5)
WBC: 14.2 10*3/uL — ABNORMAL HIGH (ref 4.0–10.5)
nRBC: 0 % (ref 0.0–0.2)

## 2023-08-16 LAB — COMPREHENSIVE METABOLIC PANEL
ALT: 72 U/L — ABNORMAL HIGH (ref 0–44)
AST: 28 U/L (ref 15–41)
Albumin: 2.5 g/dL — ABNORMAL LOW (ref 3.5–5.0)
Alkaline Phosphatase: 61 U/L (ref 38–126)
Anion gap: 6 (ref 5–15)
BUN: 9 mg/dL (ref 8–23)
CO2: 26 mmol/L (ref 22–32)
Calcium: 8.5 mg/dL — ABNORMAL LOW (ref 8.9–10.3)
Chloride: 102 mmol/L (ref 98–111)
Creatinine, Ser: 0.66 mg/dL (ref 0.61–1.24)
GFR, Estimated: >60 mL/min (ref >60)
Glucose, Bld: 161 mg/dL — ABNORMAL HIGH (ref 70–99)
Potassium: 3.7 mmol/L (ref 3.5–5.1)
Sodium: 134 mmol/L — ABNORMAL LOW (ref 135–145)
Total Bilirubin: 0.6 mg/dL (ref <1.2)
Total Protein: 5.2 g/dL — ABNORMAL LOW (ref 6.5–8.1)

## 2023-08-16 LAB — GLUCOSE, CAPILLARY
Glucose-Capillary: 162 mg/dL — ABNORMAL HIGH (ref 70–99)
Glucose-Capillary: 175 mg/dL — ABNORMAL HIGH (ref 70–99)
Glucose-Capillary: 163 mg/dL — ABNORMAL HIGH (ref 70–99)
Glucose-Capillary: 147 mg/dL — ABNORMAL HIGH (ref 70–99)
Glucose-Capillary: 155 mg/dL — ABNORMAL HIGH (ref 70–99)
Glucose-Capillary: 87 mg/dL (ref 70–99)

## 2023-08-16 LAB — MAGNESIUM: Magnesium: 1.6 mg/dL — ABNORMAL LOW (ref 1.7–2.4)

## 2023-08-16 LAB — PHOSPHORUS: Phosphorus: 2.5 mg/dL (ref 2.5–4.6)

## 2023-08-16 MED ORDER — OXYCODONE HCL 5 MG PO TABS: 5.0000 mg | ORAL_TABLET | ORAL | 0 refills | Status: DC | PRN

## 2023-08-16 MED ORDER — METHOCARBAMOL 750 MG PO TABS: 750.0000 mg | ORAL_TABLET | Freq: Three times a day (TID) | ORAL | Status: DC

## 2023-08-16 MED ORDER — NICOTINE 14 MG/24HR TD PT24: 14.0000 mg | MEDICATED_PATCH | Freq: Every day | TRANSDERMAL | 0 refills | Status: DC

## 2023-08-16 MED ORDER — HYDROMORPHONE 1 MG/ML IV SOLN
INTRAVENOUS | Status: DC
  Administered 2023-08-16: 30 mg via INTRAVENOUS
  Filled 2023-08-16: qty 30

## 2023-08-16 MED ORDER — METHOCARBAMOL 750 MG PO TABS
750.0000 mg | ORAL_TABLET | Freq: Three times a day (TID) | ORAL | Status: DC
Start: 2023-08-16 — End: 2023-08-17
  Administered 2023-08-16: 750 mg via ORAL
  Filled 2023-08-16: qty 1

## 2023-08-16 MED ORDER — ACETAMINOPHEN 500 MG PO TABS: 1000.0000 mg | ORAL_TABLET | Freq: Four times a day (QID) | ORAL | 0 refills | Status: DC

## 2023-08-16 MED ORDER — PROCHLORPERAZINE MALEATE 10 MG PO TABS: 10.0000 mg | ORAL_TABLET | Freq: Four times a day (QID) | ORAL | 0 refills | Status: DC | PRN

## 2023-08-16 MED ORDER — DICLOFENAC SODIUM 1 % EX GEL: 4.0000 g | Freq: Four times a day (QID) | CUTANEOUS | Status: DC | PRN

## 2023-08-16 MED ORDER — METHOCARBAMOL 750 MG PO TABS: 750.0000 mg | ORAL_TABLET | Freq: Three times a day (TID) | ORAL | 2 refills | Status: DC | PRN

## 2023-08-16 MED ORDER — HYDROMORPHONE HCL 1 MG/ML IJ SOLN
0.5000 mg | INTRAMUSCULAR | Status: DC | PRN
  Administered 2023-08-16: 0.5 mg via INTRAVENOUS
  Administered 2023-08-17 – 2023-08-18 (×8): 1 mg via INTRAVENOUS
  Filled 2023-08-16 (×9): qty 1

## 2023-08-16 MED ORDER — INSULIN GLARGINE-YFGN 100 UNIT/ML ~~LOC~~ SOLN
8.0000 [IU] | Freq: Every day | SUBCUTANEOUS | Status: DC
  Filled 2023-08-16: qty 0.08

## 2023-08-16 MED ORDER — ENOXAPARIN SODIUM 40 MG/0.4ML IJ SOSY
40.0000 mg | PREFILLED_SYRINGE | INTRAMUSCULAR | Status: DC
  Administered 2023-08-16 – 2023-08-19 (×4): 40 mg via SUBCUTANEOUS
  Filled 2023-08-16 (×4): qty 0.4

## 2023-08-16 MED ORDER — OXYCODONE HCL 5 MG PO TABS
5.0000 mg | ORAL_TABLET | ORAL | Status: DC | PRN
  Administered 2023-08-16 – 2023-08-19 (×16): 15 mg via ORAL
  Filled 2023-08-16 (×15): qty 3
  Filled 2023-08-16: qty 2
  Filled 2023-08-16: qty 3

## 2023-08-16 MED ORDER — MORPHINE SULFATE ER 15 MG PO TBCR
15.0000 mg | EXTENDED_RELEASE_TABLET | Freq: Two times a day (BID) | ORAL | Status: DC
  Administered 2023-08-16 – 2023-08-19 (×7): 15 mg via ORAL
  Filled 2023-08-16 (×7): qty 1

## 2023-08-16 NOTE — Progress Notes (Signed)
3 Days Post-Op   Subjective/Chief Complaint: + flatus. No n/v.  Was very sleepy and difficult to arouse earlier today so we got rid of the basal rate.  Ambulated more easily today.     Objective: Vital signs in last 24 hours: Temp:  [97.6 F (36.4 C)-98.6 F (37 C)] 97.6 F (36.4 C) (11/22 1225) Pulse Rate:  [64-116] 64 (11/22 1225) Resp:  [12-20] 16 (11/22 1225) BP: (102-169)/(59-79) 102/69 (11/22 1225) SpO2:  [91 %-98 %] 95 % (11/22 1225) FiO2 (%):  [24 %-30 %] 30 % (11/22 0359) Last BM Date : 08/12/23  Intake/Output from previous day: 11/21 0701 - 11/22 0700 In: 0  Out: 1063 [Urine:858; Drains:205] Intake/Output this shift: No intake/output data recorded.  General appearance: alert, cooperative, no distress Resp: breathing comfortably GI: soft, non distended.  Dressing c/d/I.  No bile staining in drain any longer.  Serosang.  Extremities: extremities normal, atraumatic, no cyanosis or edema  Lab Results:  Recent Labs    08/15/23 0701 08/16/23 0658  WBC 16.7* 14.2*  HGB 12.4* 11.9*  HCT 37.8* 36.7*  PLT 209 215   BMET Recent Labs    08/15/23 0701 08/16/23 0658  NA 134* 134*  K 4.0 3.7  CL 103 102  CO2 25 26  GLUCOSE 175* 161*  BUN 6* 9  CREATININE 0.78 0.66  CALCIUM 8.5* 8.5*   PT/INR No results for input(s): "LABPROT", "INR" in the last 72 hours. ABG No results for input(s): "PHART", "HCO3" in the last 72 hours.  Invalid input(s): "PCO2", "PO2"   Studies/Results: No results found.  Anti-infectives: Anti-infectives (From admission, onward)    Start     Dose/Rate Route Frequency Ordered Stop   08/13/23 2000  ciprofloxacin (CIPRO) IVPB 400 mg        400 mg 200 mL/hr over 60 Minutes Intravenous Every 12 hours 08/13/23 1543 08/13/23 2100   08/13/23 0600  ciprofloxacin (CIPRO) IVPB 400 mg        400 mg 200 mL/hr over 60 Minutes Intravenous On call to O.R. 08/13/23 0555 08/13/23 0825       Assessment/Plan: s/p Procedure(s): LAPAROSCOPY  DIAGNOSTIC (N/A) HAND ASSISTED LAPAROSCOPIC SPLENECTOMY (N/A) CONVERTED TO OPEN DISTAL SUBTOTAL PANCREATECTOMY (N/A) PARTIAL GASTRECTOMY (N/A) PARTIAL SMALL BOWEL RESECTION (N/A)  Foley out IS, pulmonary toilet. Doing IS to 1000 - 1200 mL. Pt/ot D/c PCA. Add back MS contin and prn dilaudid.  Soft diet.   TRH and Diabetes coordinator following for DM.  Await path, adenocarcinoma, clinical stage III, s/p neoadjuvant chemo.  Anticipate positive margins.    LOS: 3 days    Almond Lint 08/16/2023

## 2023-08-16 NOTE — Consult Note (Signed)
Initial Consultation Note   Patient: Scott [name] UJW:[id number] DOB: 1951/04/27 PCP: [name], MD DOA: 08/13/2023 DOS: the patient was seen and examined on 08/16/2023 Primary service: Almond Lint, MD   Assessment and Plan:  Pancreatic cancer s/p whipple S/p hand-assisted lap spleen converted to open distal subtotal pancreatectomy, partial gastrectomy, and partial small bowel resection with [name] on 11/19 Pathology is still pending Management per surgery Pain is much better controlled today   Uncontrolled diabetes mellitus type 2, with long-term use of insulin Last hemoglobin A1c was 7.8 on 07/2023 Currently on moderate-scale SSI only On glargine 20 units daily at home Will likely have different insulin needs post-operatively and this may take a few days to decide Hold metformin, Jardiance Discontinue glipizide Diabetic education consulted Continue gabapentin Added back 5 units of glargine., still mild glucose elevation - will increase to 8 units tomorrow   HTN Continue amlodipine, valsartan, spironolactone   Afib Rate controlled without medication Resume Eliquis when ok with surgery   HLD Continue fenofibrate, rosuvastatin   Chronic pain I have reviewed this patient in the [place] Controlled Substances Reporting System.  He is receiving medications from two providers but appears to be taking them as prescribed. He is not at particularly high risk of opioid misuse, diversion, or overdose.  Currently on Dilaudid PCA, may have more difficulty with pain control due to chronic pain and tolerance         Procedures: Distal subtotal pancreatectomy, splenectomy, partial gastrectomy, partial small bowel resection   Antibiotics: Ciprofloxacin x 2 doses        TRH will continue to follow the patient.  HPI: Scott [name] is a 72 y.o. male with past medical history of  HTN, HLD, CAD, afib s/p ablation, DM, and pancreatic tail adenocarcinoma who was admitted  on 11/18 for Whipple, which was performed on 11/19.   Review of Systems: As mentioned in the history of present illness. All other systems reviewed and are negative. Past Medical History:  Diagnosis Date   Arthritis    Bilateral Knees/Fingers   CAD (coronary artery disease)    Cancer (HCC) 2024   Pancreatic Cancer   Cerebrovascular disease    Diabetes mellitus without complication (HCC)    Dysrhythmia    Hx. A.Fib. Been NSR since ablation   History of kidney stones    HLD (hyperlipidemia)    HTN (hypertension)    IBS (irritable bowel syndrome)    Patient states did not have   Lumbar stenosis    MI 1996   Persistent atrial fibrillation (HCC)    PVD (peripheral vascular disease) (HCC)    Stroke (cerebrum) (HCC)    Tobacco abuse    Past Surgical History:  Procedure Laterality Date   ABDOMINAL AORTOGRAM W/LOWER EXTREMITY N/A 01/25/2017   Procedure: Abdominal Aortogram w/Lower Extremity;  Surgeon: Sherren Kerns, MD;  Location: [facility] INVASIVE CV LAB;  Service: Cardiovascular;  Laterality: N/A;   ANTERIOR CRUCIATE LIGAMENT REPAIR     BOWEL RESECTION N/A 08/13/2023   Procedure: PARTIAL SMALL BOWEL RESECTION;  Surgeon: Almond Lint, MD;  Location: [facility] OR;  Service: General;  Laterality: N/A;   CARDIAC CATHETERIZATION     CONE   CARDIAC CATHETERIZATION N/A 07/27/2016   Procedure: Left Heart Cath and Coronary Angiography;  Surgeon: Tonny Bollman, MD;  Location: [facility] INVASIVE CV LAB;  Service: Cardiovascular;  Laterality: N/A;   CARDIOVERSION N/A 06/29/2016   Procedure: CARDIOVERSION;  Surgeon: Pricilla Riffle, MD;  Location: [facility] ENDOSCOPY;  Service: Cardiovascular;  Laterality: N/A;   CARDIOVERSION N/A 03/05/2018   Procedure: CARDIOVERSION;  Surgeon: Laurey Morale, MD;  Location: [facility] ENDOSCOPY;  Service: Cardiovascular;  Laterality: N/A;   CHOLECYSTECTOMY     ENDARTERECTOMY Right 12/06/2017   Procedure: ENDARTERECTOMY CAROTID RIGHT;  Surgeon: Nada Libman, MD;  Location: [facility] OR;   Service: Vascular;  Laterality: Right;   ESOPHAGOGASTRODUODENOSCOPY (EGD) WITH PROPOFOL N/A 05/01/2023   Procedure: ESOPHAGOGASTRODUODENOSCOPY (EGD) WITH PROPOFOL;  Surgeon: Willis Modena, MD;  Location: [facility] ENDOSCOPY;  Service: Gastroenterology;  Laterality: N/A;   FINE NEEDLE ASPIRATION N/A 05/01/2023   Procedure: FINE NEEDLE ASPIRATION (FNA) LINEAR;  Surgeon: Willis Modena, MD;  Location: [facility] ENDOSCOPY;  Service: Gastroenterology;  Laterality: N/A;   KNEE SURGERY Bilateral 1982, 1988, 1997, 2000   x 4 times.   LAPAROSCOPY N/A 08/13/2023   Procedure: LAPAROSCOPY DIAGNOSTIC;  Surgeon: Almond Lint, MD;  Location: [facility] OR;  Service: General;  Laterality: N/A;   LOWER EXTREMITY ANGIOGRAPHY N/A 07/03/2019   Procedure: LOWER EXTREMITY ANGIOGRAPHY;  Surgeon: Sherren Kerns, MD;  Location: [facility] INVASIVE CV LAB;  Service: Cardiovascular;  Laterality: N/A;   LUMBAR LAMINECTOMY/DECOMPRESSION MICRODISCECTOMY Bilateral 09/09/2018   Procedure: Laminectomy and Foraminotomy bilateral Lumbar three-Lumbar four - Lumbar four-Lumbar five;  Surgeon: Julio Sicks, MD;  Location: [facility] OR;  Service: Neurosurgery;  Laterality: Bilateral;   PARTIAL GASTRECTOMY N/A 08/13/2023   Procedure: PARTIAL GASTRECTOMY;  Surgeon: Almond Lint, MD;  Location: [facility] OR;  Service: General;  Laterality: N/A;   PATCH ANGIOPLASTY Right 12/06/2017   Procedure: PATCH ANGIOPLASTY RIGHT CAROTID ARTERY;  Surgeon: Nada Libman, MD;  Location: [facility] OR;  Service: Vascular;  Laterality: Right;   PERIPHERAL VASCULAR INTERVENTION Right 01/25/2017   Procedure: Peripheral Vascular Intervention;  Surgeon: Sherren Kerns, MD;  Location: [facility] INVASIVE CV LAB;  Service: Cardiovascular;  Laterality: Right;   PERIPHERAL VASCULAR INTERVENTION Right 07/03/2019   Procedure: PERIPHERAL VASCULAR INTERVENTION;  Surgeon: Sherren Kerns, MD;  Location: [facility] INVASIVE CV LAB;  Service: Cardiovascular;  Laterality: Right;   PORTACATH PLACEMENT Left 05/14/2023    Procedure: INSERTION PORT-A-CATH;  Surgeon: Almond Lint, MD;  Location: [facility] OR;  Service: General;  Laterality: Left;   TONSILLECTOMY     UPPER ESOPHAGEAL ENDOSCOPIC ULTRASOUND (EUS) Bilateral 05/01/2023   Procedure: UPPER ESOPHAGEAL ENDOSCOPIC ULTRASOUND (EUS);  Surgeon: Willis Modena, MD;  Location: [facility] ENDOSCOPY;  Service: Gastroenterology;  Laterality: Bilateral;   XI ROBOTIC ASSISTED LAPAROSCOPIC DISTAL PANCREATECTOMY N/A 08/13/2023   Procedure: HAND ASSISTED LAPAROSCOPIC SPLENECTOMY;  Surgeon: Almond Lint, MD;  Location: [facility] OR;  Service: General;  Laterality: N/A;   Social History:  reports that he has been smoking cigarettes. He started smoking about 52 years ago. He has a 50 pack-year smoking history. He has never used smokeless tobacco. He reports current alcohol use. He reports that he does not use drugs.  Allergies  Allergen Reactions   Augmentin [Amoxicillin-Pot Clavulanate] Rash    Family History  Problem Relation Age of Onset   Heart disease Father    Melanoma Father    Testicular cancer Father    Hypertension Mother    Arthritis Mother    Arthritis Sister    Lung disease Neg Hx     Prior to Admission medications   Medication Sig Start Date End Date Taking? Authorizing Provider  amLODipine (NORVASC) 5 MG tablet Take 5 mg by mouth at bedtime. 11/29/22  Yes [provider]  apixaban (ELIQUIS) 5 MG TABS tablet Take 1 tablet (5 mg total) by  mouth 2 (two) times daily. 07/03/23  Yes [name], [name], MD  aspirin EC 81 MG tablet Take 81 mg by mouth daily. 09/08/20  Yes [provider]  esomeprazole (NEXIUM) 40 MG capsule Take 40 mg by mouth daily as needed (heartburn/hiccups).   Yes [provider]  fenofibrate (TRICOR) 48 MG tablet Take 48 mg by mouth daily. 01/16/22  Yes [provider]  gabapentin (NEURONTIN) 300 MG capsule Take 900 mg by mouth 2 (two) times daily. 02/03/20  Yes [provider]  glipiZIDE (GLUCOTROL XL) 5 MG 24 hr  tablet Take 5 mg by mouth daily after supper. 01/09/23  Yes [provider]  Insulin Glargine (BASAGLAR KWIKPEN) 100 UNIT/ML Inject 20 Units into the skin daily.   Yes [provider]  JARDIANCE 25 MG TABS tablet Take 25 mg by mouth daily. 03/03/22  Yes [provider]  metFORMIN (GLUCOPHAGE-XR) 500 MG 24 hr tablet Take 1,000 mg by mouth daily after supper. 01/10/22  Yes [provider]  morphine (MS CONTIN) 15 MG 12 hr tablet Take 1 tablet (15 mg total) by mouth every 12 (twelve) hours. Patient taking differently: Take 15 mg by mouth at bedtime. 07/09/23  Yes [name], MD  ondansetron (ZOFRAN) 4 MG tablet Take 4 mg by mouth every 8 (eight) hours as needed for nausea or vomiting.   Yes [provider]  oxyCODONE-acetaminophen (PERCOCET/ROXICET) 5-325 MG tablet Take 1 tablet by mouth every 8 (eight) hours as needed for severe pain (pain score 7-10). 08/01/23  Yes [name], MD  polyethylene glycol (MIRALAX / GLYCOLAX) 17 g packet Take 17 g by mouth daily as needed for moderate constipation.   Yes [provider]  rosuvastatin (CRESTOR) 40 MG tablet Take 40 mg by mouth daily.   Yes [provider]  spironolactone (ALDACTONE) 25 MG tablet Take 25 mg by mouth daily.   Yes [provider]  valsartan (DIOVAN) 320 MG tablet Take 320 mg by mouth daily. 02/22/20  Yes [provider]  diazepam (VALIUM) 2 MG tablet Take 2 mg by mouth 2 (two) times daily as needed for anxiety. 02/18/20   [provider]  sildenafil (VIAGRA) 100 MG tablet Take 100 mg by mouth as needed for erectile dysfunction. 04/14/18   [provider]    Physical Exam: Vitals:   08/16/23 0500 08/16/23 0530 08/16/23 0628 08/16/23 0758  BP: (!) 159/73 (!) 159/69 (!) 120/59   Pulse: (!) 113 (!) 107 93   Resp: 16 16 16 16   Temp: 98.5 F (36.9 C) 98.5 F (36.9 C) 98.5 F (36.9 C)   TempSrc: Oral Oral Oral   SpO2: 95% 96% 98% 96%  Weight:       Height:         Objective: Vitals:   08/16/23 0628 08/16/23 0758  BP: (!) 120/59   Pulse: 93   Resp: 16 16  Temp: 98.5 F (36.9 C)   SpO2: 98% 96%    Intake/Output Summary (Last 24 hours) at 08/16/2023 0814 Last data filed at 08/16/2023 0545 Gross per 24 hour  Intake 0 ml  Output 1063 ml  Net -1063 ml   Filed Weights   08/13/23 0552  Weight: 65.4 kg   He continues to have improving pain, working towards some dc parameters.  Off basal rate of PCA, understands need to move to PO pain medications prior to dc.  Also advancing diet, tolerating full liquids.  Exam:  General:  Appears calm and comfortable  and is in NAD Eyes:   EOMI, normal lids, iris ENT:  grossly normal hearing, lips & tongue, mmm Neck:  no LAD, masses or thyromegaly Cardiovascular:  RRR, no m/r/g. No LE edema.  Respiratory:   CTA bilaterally with no wheezes/rales/rhonchi.  Normal respiratory effort. Abdomen:  less drainage from drains today, abdomen appropriately tender, dressing on midline C/D/I Skin:  no rash or induration seen on limited exam Musculoskeletal:  grossly normal tone BUE/BLE, good ROM, no bony abnormality Psychiatric:  appropriate mood and affect, speech fluent and appropriate, AOx3 Neurologic:  CN 2-12 grossly intact, moves all extremities in coordinated fashion  Data Reviewed: I have reviewed the patient's lab results since admission.  Pertinent labs for today include:  Na++ 134 Glucose 161 Mag++ 1.6 Albumin 2.5 AST 28/ALT 72 WBC 14.2 Hgb 11.9     Family Communication: Wife was present throughout evaluation   Thank you very much for involving [place] in the care of your patient.  Author: Jonah Blue, MD 08/16/2023 8:07 AM  For on call review [email or web address].

## 2023-08-16 NOTE — Care Management Important Message (Signed)
Important Message  Patient Details  Name: Scott [name] MRN: [id number] Date of Birth: Nov 04, 1950   Important Message Given:  Yes - Medicare IM     Sherilyn Banker 08/16/2023, 3:45 PM

## 2023-08-16 NOTE — Plan of Care (Signed)

## 2023-08-16 NOTE — Progress Notes (Signed)
PHARMACY - ANTICOAGULATION CONSULT NOTE  Pharmacy Consult for enoxaparin Indication: VTE prophylaxis  Allergies  Allergen Reactions   Augmentin [Amoxicillin-Pot Clavulanate] Rash    Patient Measurements: Height: 5\' 10"  (177.8 cm) Weight: 65.4 kg (144 lb 1.6 oz) IBW/kg (Calculated) : 73  Vital Signs: Temp: 97.6 F (36.4 C) (11/22 1225) Temp Source: Oral (11/22 1225) BP: 102/69 (11/22 1225) Pulse Rate: 64 (11/22 1225)  Labs: Recent Labs    08/14/23 0715 08/15/23 0701 08/16/23 0658  HGB 13.2 12.4* 11.9*  HCT 39.3 37.8* 36.7*  PLT 220 209 215  CREATININE 0.65 0.78 0.66    Estimated Creatinine Clearance: 79.5 mL/min (by C-G formula based on SCr of 0.66 mg/dL).   Medical History: Past Medical History:  Diagnosis Date   Arthritis    Bilateral Knees/Fingers   CAD (coronary artery disease)    Cancer (HCC) 2024   Pancreatic Cancer   Cerebrovascular disease    Diabetes mellitus without complication (HCC)    Dysrhythmia    Hx. A.Fib. Been NSR since ablation   History of kidney stones    HLD (hyperlipidemia)    HTN (hypertension)    IBS (irritable bowel syndrome)    Patient states did not have   Lumbar stenosis    MI 1996   Persistent atrial fibrillation (HCC)    PVD (peripheral vascular disease) (HCC)    Stroke (cerebrum) (HCC)    Tobacco abuse     Medications:  Scheduled:   acetaminophen  1,000 mg Oral Q6H   amLODipine  5 mg Oral QHS   feeding supplement  237 mL Oral BID BM   gabapentin  900 mg Oral BID   insulin aspart  0-15 Units Subcutaneous Q4H   insulin glargine-yfgn  5 Units Subcutaneous Daily   irbesartan  300 mg Oral Daily   melatonin  5 mg Oral Once   methocarbamol (ROBAXIN) injection  750 mg Intravenous Q8H   morphine  15 mg Oral Q12H   nicotine  14 mg Transdermal Daily   senna  1 tablet Oral BID   spironolactone  25 mg Oral Daily   Infusions:  PRN: diazepam, diclofenac Sodium, HYDROmorphone (DILAUDID) injection, melatonin, oxyCODONE,  prochlorperazine **OR** prochlorperazine  Assessment: Patient is s/p laparoscopic splenectomy, subtotal pancreatectomy, partial gastrectomy, and partial small bowel resection in the setting of pancreatic adenocarcinoma.   In speaking with [name] today, we will resume enoxaparin VTE prophylaxis today (11/22).    Goal of Therapy:  Monitor platelets by anticoagulation protocol: Yes   Plan:  Lovenox 40 mg [place] daily  Pharmacy will sign off, please reconsult as needed   Cedric Fishman 08/16/2023,2:07 PM

## 2023-08-16 NOTE — Inpatient Diabetes Management (Signed)
Inpatient Diabetes Program Recommendations  AACE/ADA: New Consensus Statement on Inpatient Glycemic Control (2015)  Target Ranges:  Prepandial:   less than 140 mg/dL      Peak postprandial:   less than 180 mg/dL (1-2 hours)      Critically ill patients:  140 - 180 mg/dL   Lab Results  Component Value Date   GLUCAP 163 (H) 08/16/2023   HGBA1C 7.8 (H) 08/08/2023    Review of Glycemic Control  Latest Reference Range & Units 08/15/23 23:03 08/16/23 03:03 08/16/23 08:13 08/16/23 12:18  Glucose-Capillary 70 - 99 mg/dL 409 (H) 811 (H) 914 (H) 163 (H)  (H): Data is abnormally high Diabetes history: DM  Outpatient Diabetes medications:  Glucotrol XL 5 mg daily Basaglar 20 units daily Jardiance 25 mg daily Metformin 1000 mg daily Current orders for Inpatient glycemic control:  Novolog 0-15 units q 4 hours   Inpatient Diabetes Program Recommendations:    Consider adding Semglee 10 units daily.   Spoke with patient and wife at length regarding outpatient diabetes management.  Reviewed patient's current A1c of 7.8%. Explained what a A1c is and what it measures. Also reviewed goal A1c with patient, importance of good glucose control @ home, and blood sugar goals. Reviewed patho of DM, need for insulin changes at discharge, survival skills, interventions, vascular changes and commorbidities.  Patient usually wears FS libre. Provided with additional sensors.  Discussed current trends and recommendations. Also, patient has been having multiple episodes of hypoglycemia in the early AM hours at home prior to admission. Discussed interventions, when to reach out to PCP, recommendations for Glipizide at discharge and current glucose trends.  No additional questions at this time.  At discharge: Would consider discontinuing Glipizide.  Thanks, Lujean Rave, MSN, RNC-OB Diabetes Coordinator [phone] (8a-5p)

## 2023-08-16 NOTE — Progress Notes (Signed)
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 PMH: DM, GERD, HLD, HTN, CA, Afib   OT comments  Patient received in supine and eager to participate. Patient instructed on rolling to left and use of pillow to address pain. Patient BP seated on EOB 152/73 (91). Patient was min assist to stand and step pivot to recliner with increased pain once sitting and addressed with PCA. Patient performed self care tasks seated in recliner and positioned for comfort at end of session. Discharge recommendations continue to be appropriate. Acute OT to continue to follow.       If plan is discharge home, recommend the following:  A little help with walking and/or transfers;A lot of help with bathing/dressing/bathroom;Assist for transportation;Help with stairs or ramp for entrance   Equipment Recommendations  BSC/3in1    Recommendations for Other Services      Precautions / Restrictions Precautions Precautions: Fall Precaution Comments: JP drain x2 L abdomen, PCA Restrictions Weight Bearing Restrictions: No       Mobility Bed Mobility Overal bed mobility: Needs Assistance Bed Mobility: Supine to Sit     Supine to sit: Min assist     General bed mobility comments: education on rolling to get to EOB and use of pillow for pain    Transfers Overall transfer level: Needs assistance Equipment used: Rolling walker (2 wheels) Transfers: Sit to/from Stand, Bed to chair/wheelchair/BSC Sit to Stand: Min assist, From elevated surface     Step pivot transfers: Min assist     General transfer comment: min assist to power and walker management with cues for  hand placement     Balance Overall balance assessment: Needs assistance Sitting-balance support: Bilateral upper extremity supported, Feet supported Sitting balance-Leahy Scale: Poor Sitting balance - Comments: supervision seated on EOB   Standing balance support: Bilateral upper extremity supported, During functional activity, Reliant on assistive device for balance Standing balance-Leahy Scale: Poor Standing balance comment: reliant on UE support for balance                           ADL either performed or assessed with clinical judgement   ADL Overall ADL's : Needs assistance/impaired     Grooming: Wash/dry hands;Wash/dry face;Oral care;Sitting;Brushing hair;Set up       Lower Body Bathing: Total assistance;Sitting/lateral leans;Sit to/from stand   Upper Body Dressing : Minimal assistance;Sitting   Lower Body Dressing: Total assistance;Bed level   Toilet Transfer: Minimal assistance Toilet Transfer Details (indicate cue type and reason): simulated to recliner         Functional mobility during ADLs: Minimal assistance      Extremity/Trunk Assessment              Vision       Perception     Praxis      Cognition Arousal: Alert Behavior During Therapy: WFL for tasks assessed/performed Overall Cognitive Status: Within Functional Limits for tasks assessed                                          Exercises  Shoulder Instructions       General Comments tachy on EOB  BP 152/73    Pertinent Vitals/ Pain       Pain Assessment Pain Assessment: Faces Faces Pain Scale: Hurts even more Pain Location: abdomen at surgical site Pain Descriptors / Indicators: Sharp, Guarding, Shooting, Discomfort, Grimacing Pain Intervention(s): Limited activity within patient's tolerance, Monitored during session, Premedicated before session, Repositioned  Home Living                                          Prior  Functioning/Environment              Frequency  Min 1X/week        Progress Toward Goals  OT Goals(current goals can now be found in the care plan section)  Progress towards OT goals: Progressing toward goals  Acute Rehab OT Goals Patient Stated Goal: go home soon OT Goal Formulation: With patient Time For Goal Achievement: 08/29/23 Potential to Achieve Goals: Good ADL Goals Pt Will Perform Grooming: with supervision;standing Pt Will Perform Upper Body Bathing: with supervision;sitting Pt Will Perform Lower Body Dressing: with supervision;with adaptive equipment;sit to/from stand;sitting/lateral leans Pt Will Transfer to Toilet: with supervision;ambulating;bedside commode Pt Will Perform Toileting - Clothing Manipulation and hygiene: with supervision;sitting/lateral leans;sit to/from stand Additional ADL Goal #1: Pt will demonstrate improved bed mobility while transitioning from supine to sitting on EOB with SBA demonstrating carry over of modified log roll with SBA. Additional ADL Goal #2: Pt will demonstrate improved body awareness and core stability while maintaining sitting and standing balance requiring SBA during self care tasks completion.  Plan      Co-evaluation                 AM-PAC OT "6 Clicks" Daily Activity     Outcome Measure   Help from another person eating meals?: None Help from another person taking care of personal grooming?: A Little Help from another person toileting, which includes using toliet, bedpan, or urinal?: A Lot Help from another person bathing (including washing, rinsing, drying)?: A Lot Help from another person to put on and taking off regular upper body clothing?: A Little Help from another person to put on and taking off regular lower body clothing?: Total 6 Click Score: 15    End of Session Equipment Utilized During Treatment: Rolling walker (2 wheels)  OT Visit Diagnosis: Unsteadiness on feet (R26.81);Muscle weakness  (generalized) (M62.81);Pain Pain - Right/Left: Left Pain - part of body:  (flank)   Activity Tolerance Patient tolerated treatment well;Patient limited by pain   Patient Left in chair;with call bell/phone within reach;with chair alarm set;with nursing/sitter in room   Nurse Communication Mobility status        Time: 9811-9147 OT Time Calculation (min): 31 min  Charges: OT General Charges $OT Visit: 1 Visit OT Treatments $Self Care/Home Management : 8-22 mins $Therapeutic Activity: 8-22 mins  Alfonse Flavors, OTA Acute Rehabilitation Services  Office [phone]   Dewain Penning 08/16/2023, 9:34 AM

## 2023-08-17 DIAGNOSIS — C252 Malignant neoplasm of tail of pancreas: Secondary | ICD-10-CM | POA: Diagnosis not present

## 2023-08-17 LAB — GLUCOSE, CAPILLARY
Glucose-Capillary: 181 mg/dL — ABNORMAL HIGH (ref 70–99)
Glucose-Capillary: 127 mg/dL — ABNORMAL HIGH (ref 70–99)
Glucose-Capillary: 264 mg/dL — ABNORMAL HIGH (ref 70–99)
Glucose-Capillary: 219 mg/dL — ABNORMAL HIGH (ref 70–99)
Glucose-Capillary: 61 mg/dL — ABNORMAL LOW (ref 70–99)
Glucose-Capillary: 96 mg/dL (ref 70–99)
Glucose-Capillary: 135 mg/dL — ABNORMAL HIGH (ref 70–99)
Glucose-Capillary: 188 mg/dL — ABNORMAL HIGH (ref 70–99)

## 2023-08-17 LAB — COMPREHENSIVE METABOLIC PANEL
ALT: 51 U/L — ABNORMAL HIGH (ref 0–44)
AST: 19 U/L (ref 15–41)
Albumin: 2.5 g/dL — ABNORMAL LOW (ref 3.5–5.0)
Alkaline Phosphatase: 64 U/L (ref 38–126)
Anion gap: 8 (ref 5–15)
BUN: 10 mg/dL (ref 8–23)
CO2: 28 mmol/L (ref 22–32)
Calcium: 8.5 mg/dL — ABNORMAL LOW (ref 8.9–10.3)
Chloride: 99 mmol/L (ref 98–111)
Creatinine, Ser: 0.59 mg/dL — ABNORMAL LOW (ref 0.61–1.24)
GFR, Estimated: >60 mL/min (ref >60)
Glucose, Bld: 133 mg/dL — ABNORMAL HIGH (ref 70–99)
Potassium: 3.5 mmol/L (ref 3.5–5.1)
Sodium: 135 mmol/L (ref 135–145)
Total Bilirubin: 0.3 mg/dL (ref <1.2)
Total Protein: 5.4 g/dL — ABNORMAL LOW (ref 6.5–8.1)

## 2023-08-17 LAB — CBC
HCT: 36 % — ABNORMAL LOW (ref 39.0–52.0)
Hemoglobin: 12 g/dL — ABNORMAL LOW (ref 13.0–17.0)
MCH: 29.5 pg (ref 26.0–34.0)
MCHC: 33.3 g/dL (ref 30.0–36.0)
MCV: 88.5 fL (ref 80.0–100.0)
Platelets: 311 10*3/uL (ref 150–400)
RBC: 4.07 MIL/uL — ABNORMAL LOW (ref 4.22–5.81)
RDW: 15 % (ref 11.5–15.5)
WBC: 10.5 10*3/uL (ref 4.0–10.5)
nRBC: 0 % (ref 0.0–0.2)

## 2023-08-17 LAB — PHOSPHORUS: Phosphorus: 2.6 mg/dL (ref 2.5–4.6)

## 2023-08-17 LAB — MAGNESIUM: Magnesium: 1.6 mg/dL — ABNORMAL LOW (ref 1.7–2.4)

## 2023-08-17 MED ORDER — POLYETHYLENE GLYCOL 3350 17 G PO PACK
17.0000 g | PACK | Freq: Every day | ORAL | Status: DC
  Administered 2023-08-17 – 2023-08-19 (×3): 17 g via ORAL
  Filled 2023-08-17 (×3): qty 1

## 2023-08-17 MED ORDER — INSULIN GLARGINE-YFGN 100 UNIT/ML ~~LOC~~ SOLN
10.0000 [IU] | Freq: Every day | SUBCUTANEOUS | Status: DC
  Administered 2023-08-17: 10 [IU] via SUBCUTANEOUS
  Filled 2023-08-17 (×2): qty 0.1

## 2023-08-17 MED ORDER — METHOCARBAMOL 500 MG PO TABS
1000.0000 mg | ORAL_TABLET | Freq: Three times a day (TID) | ORAL | Status: DC
  Administered 2023-08-17 – 2023-08-19 (×8): 1000 mg via ORAL
  Filled 2023-08-17 (×9): qty 2

## 2023-08-17 MED ORDER — INSULIN ASPART 100 UNIT/ML IJ SOLN
0.0000 [IU] | Freq: Three times a day (TID) | INTRAMUSCULAR | Status: DC
  Administered 2023-08-17 – 2023-08-18 (×2): 5 [IU] via SUBCUTANEOUS
  Administered 2023-08-18: 2 [IU] via SUBCUTANEOUS
  Administered 2023-08-18 (×2): 5 [IU] via SUBCUTANEOUS
  Administered 2023-08-19: 2 [IU] via SUBCUTANEOUS
  Administered 2023-08-19: 3 [IU] via SUBCUTANEOUS

## 2023-08-17 MED ORDER — DOCUSATE SODIUM 100 MG PO CAPS
100.0000 mg | ORAL_CAPSULE | Freq: Two times a day (BID) | ORAL | Status: DC
  Administered 2023-08-17 – 2023-08-19 (×5): 100 mg via ORAL
  Filled 2023-08-17 (×5): qty 1

## 2023-08-17 NOTE — Consult Note (Signed)
Progress Note   Patient: Scott [name] WJX:[id number] DOB: 05/08/1951 DOA: 08/13/2023     4 DOS: the patient was seen and examined on 08/17/2023   Brief hospital course: Scott [name] is a 72 y.o. male with past medical history of  HTN, HLD, CAD, afib s/p ablation, DM, and pancreatic tail adenocarcinoma who was admitted on 11/18 for Whipple, which was performed on 11/19.   Assessment and Plan:  Pancreatic cancer s/p whipple S/p hand-assisted lap spleen converted to open distal subtotal pancreatectomy, partial gastrectomy, and partial small bowel resection with [name] on 11/19 Pathology is still pending Management per surgery Now off PCA, progressing   Uncontrolled diabetes mellitus type 2, with long-term use of insulin Last hemoglobin A1c was 7.8 on 07/2023 On glargine 20 units daily at home Will likely have different insulin needs post-operatively and this may take a few days to decide Hold metformin, Jardiance Discontinue glipizide Diabetic education consulted Continue gabapentin Slowly titrating insulin, up to 10 units + SSI   HTN Continue amlodipine, valsartan, spironolactone   Afib Rate controlled without medication Resume Eliquis when ok with surgery   HLD Continue fenofibrate, rosuvastatin   Chronic pain I have reviewed this patient in the [place] Controlled Substances Reporting System.  He is receiving medications from two providers but appears to be taking them as prescribed. He is not at particularly high risk of opioid misuse, diversion, or overdose.  Currently on Dilaudid PCA, may have more difficulty with pain control due to chronic pain and tolerance         Procedures: Distal subtotal pancreatectomy, splenectomy, partial gastrectomy, partial small bowel resection   Antibiotics: Ciprofloxacin x 2 doses         TRH will continue to follow the patient.  30 Day Unplanned Readmission Risk Score    Flowsheet Row Admission (Current) from  08/13/2023 in [facility]  30 Day Unplanned Readmission Risk Score (%) 21.12 Filed at 08/17/2023 0400       This score is the patient's risk of an unplanned readmission within 30 days of being discharged (0 -100%). The score is based on dignosis, age, lab data, medications, orders, and past utilization.   Low:  0-14.9   Medium: 15-21.9   High: 22-29.9   Extreme: 30 and above           Subjective: Having more pain since PCA was discontinued but getting up and moving some.  1 drain with more significant sanguinous drainage but the other has scant output.   Objective: Vitals:   08/16/23 2231 08/17/23 0243  BP: (!) 170/71 (!) 179/84  Pulse: 78 79  Resp:    Temp: 99.8 F (37.7 C) 98.6 F (37 C)  SpO2: 91% 94%    Intake/Output Summary (Last 24 hours) at 08/17/2023 0748 Last data filed at 08/17/2023 0254 Gross per 24 hour  Intake 120 ml  Output 1415 ml  Net -1295 ml   Filed Weights   08/13/23 0552  Weight: 65.4 kg    Exam:  General:  Appears calm and comfortable and is in NAD Eyes:   EOMI, normal lids, iris ENT:  grossly normal hearing, lips & tongue, mmm Neck:  no LAD, masses or thyromegaly Cardiovascular:  RRR, no m/r/g. No LE edema.  Respiratory:   CTA bilaterally with no wheezes/rales/rhonchi.  Normal respiratory effort. Abdomen:  less drainage from drains today, abdomen appropriately tender, dressing on midline C/D/I Skin:  no rash or induration seen on limited  exam Musculoskeletal:  grossly normal tone BUE/BLE, good ROM, no bony abnormality Psychiatric:  appropriate mood and affect, speech fluent and appropriate, AOx3 Neurologic:  CN 2-12 grossly intact, moves all extremities in coordinated fashion   Data Reviewed: I have reviewed the patient's lab results since admission.  Pertinent labs for today include:   Glucose 87-181 Mag++ 1.6 Albumin 2.5 AST 19/ALT 51 WBC 10.5 Hgb 12     Family Communication: Wife was present  throughout evaluation  Disposition: Status is: Inpatient Remains inpatient appropriate because: ongoing management     Time spent: 35 minutes  Unresulted Labs (From admission, onward)     Start     Ordered   08/14/23 0500  Comprehensive metabolic panel  Daily,   R      08/13/23 1543   08/14/23 0500  Magnesium  Daily,   R      08/13/23 1543   08/14/23 0500  Phosphorus  Daily,   R      08/13/23 1543   08/14/23 0500  CBC  Daily,   R      08/13/23 1543             Author: Jonah Blue, MD 08/17/2023 7:48 AM  For on call review [email or web address].

## 2023-08-17 NOTE — Hospital Course (Signed)
Scott [name] is a 72 y.o. male with past medical history of  HTN, HLD, CAD, afib s/p ablation, DM, and pancreatic tail adenocarcinoma who was admitted on 11/18 for Whipple, which was performed on 11/19.

## 2023-08-17 NOTE — Progress Notes (Signed)
4 Days Post-Op   Subjective/Chief Complaint: No real bowel function but not distended. No n/v.  Still struggling with pain control    Objective: Vital signs in last 24 hours: Temp:  [97.6 F (36.4 C)-99.8 F (37.7 C)] 98.4 F (36.9 C) (11/23 0801) Pulse Rate:  [61-79] 79 (11/23 0243) Resp:  [12-18] 18 (11/23 0801) BP: (102-179)/(54-84) 163/76 (11/23 0801) SpO2:  [91 %-95 %] 94 % (11/23 0243) Last BM Date : 08/12/23  Intake/Output from previous day: 11/22 0701 - 11/23 0700 In: 120 [P.O.:120] Out: 1415 [Urine:1325; Drains:90] Intake/Output this shift: No intake/output data recorded.  General appearance: alert, cooperative, no distress Resp: breathing comfortably GI: soft, non distended.  Dressing c/d/I.  No bile staining in drain.  Serosang.  Extremities: extremities normal, atraumatic, no cyanosis or edema  Lab Results:  Recent Labs    08/16/23 0658 08/17/23 0502  WBC 14.2* 10.5  HGB 11.9* 12.0*  HCT 36.7* 36.0*  PLT 215 311   BMET Recent Labs    08/16/23 0658 08/17/23 0502  NA 134* 135  K 3.7 3.5  CL 102 99  CO2 26 28  GLUCOSE 161* 133*  BUN 9 10  CREATININE 0.66 0.59*  CALCIUM 8.5* 8.5*   PT/INR No results for input(s): "LABPROT", "INR" in the last 72 hours. ABG No results for input(s): "PHART", "HCO3" in the last 72 hours.  Invalid input(s): "PCO2", "PO2"   Studies/Results: No results found.  Anti-infectives: Anti-infectives (From admission, onward)    Start     Dose/Rate Route Frequency Ordered Stop   08/13/23 2000  ciprofloxacin (CIPRO) IVPB 400 mg        400 mg 200 mL/hr over 60 Minutes Intravenous Every 12 hours 08/13/23 1543 08/13/23 2100   08/13/23 0600  ciprofloxacin (CIPRO) IVPB 400 mg        400 mg 200 mL/hr over 60 Minutes Intravenous On call to O.R. 08/13/23 0555 08/13/23 0825       Assessment/Plan: s/p Procedure(s): LAPAROSCOPY DIAGNOSTIC (N/A) HAND ASSISTED LAPAROSCOPIC SPLENECTOMY (N/A) CONVERTED TO OPEN DISTAL  SUBTOTAL PANCREATECTOMY (N/A) PARTIAL GASTRECTOMY (N/A) PARTIAL SMALL BOWEL RESECTION (N/A)  Foley out IS, pulmonary toilet. Doing IS to 1000 - 1200 mL. Pt/ot Continue MS contin and prn dilaudid. Also has PRN PO oxy for pain control. Increased robaxin to 1000 TID Soft diet. Increase bowel regimen  TRH and Diabetes coordinator following for DM.  Await path, adenocarcinoma, clinical stage III, s/p neoadjuvant chemo.  Anticipate positive margins.    LOS: 4 days   Juliet Rude, [facility] Surgery 08/17/2023, 8:43 AM Please see Amion for pager number during day hours 7:00am-4:30pm

## 2023-08-18 DIAGNOSIS — C252 Malignant neoplasm of tail of pancreas: Secondary | ICD-10-CM | POA: Diagnosis not present

## 2023-08-18 LAB — COMPREHENSIVE METABOLIC PANEL
ALT: 39 U/L (ref 0–44)
AST: 19 U/L (ref 15–41)
Albumin: 2.5 g/dL — ABNORMAL LOW (ref 3.5–5.0)
Alkaline Phosphatase: 65 U/L (ref 38–126)
Anion gap: 9 (ref 5–15)
BUN: 9 mg/dL (ref 8–23)
CO2: 24 mmol/L (ref 22–32)
Calcium: 8.3 mg/dL — ABNORMAL LOW (ref 8.9–10.3)
Chloride: 98 mmol/L (ref 98–111)
Creatinine, Ser: 0.63 mg/dL (ref 0.61–1.24)
GFR, Estimated: >60 mL/min (ref >60)
Glucose, Bld: 171 mg/dL — ABNORMAL HIGH (ref 70–99)
Potassium: 3.7 mmol/L (ref 3.5–5.1)
Sodium: 131 mmol/L — ABNORMAL LOW (ref 135–145)
Total Bilirubin: 0.7 mg/dL (ref <1.2)
Total Protein: 5.6 g/dL — ABNORMAL LOW (ref 6.5–8.1)

## 2023-08-18 LAB — GLUCOSE, CAPILLARY
Glucose-Capillary: 201 mg/dL — ABNORMAL HIGH (ref 70–99)
Glucose-Capillary: 225 mg/dL — ABNORMAL HIGH (ref 70–99)
Glucose-Capillary: 209 mg/dL — ABNORMAL HIGH (ref 70–99)
Glucose-Capillary: 148 mg/dL — ABNORMAL HIGH (ref 70–99)

## 2023-08-18 LAB — CBC
HCT: 36.5 % — ABNORMAL LOW (ref 39.0–52.0)
Hemoglobin: 12.3 g/dL — ABNORMAL LOW (ref 13.0–17.0)
MCH: 30.2 pg (ref 26.0–34.0)
MCHC: 33.7 g/dL (ref 30.0–36.0)
MCV: 89.7 fL (ref 80.0–100.0)
Platelets: 362 10*3/uL (ref 150–400)
RBC: 4.07 MIL/uL — ABNORMAL LOW (ref 4.22–5.81)
RDW: 15.1 % (ref 11.5–15.5)
WBC: 9.8 10*3/uL (ref 4.0–10.5)
nRBC: 0 % (ref 0.0–0.2)

## 2023-08-18 LAB — PHOSPHORUS: Phosphorus: 3.3 mg/dL (ref 2.5–4.6)

## 2023-08-18 LAB — MAGNESIUM: Magnesium: 1.5 mg/dL — ABNORMAL LOW (ref 1.7–2.4)

## 2023-08-18 MED ORDER — SODIUM CHLORIDE 1 G PO TABS
1.0000 g | ORAL_TABLET | Freq: Three times a day (TID) | ORAL | Status: DC
Start: 2023-08-18 — End: 2023-08-19
  Administered 2023-08-18 – 2023-08-19 (×5): 1 g via ORAL
  Filled 2023-08-18 (×5): qty 1

## 2023-08-18 MED ORDER — MAGNESIUM SULFATE 4 GM/100ML IV SOLN
4.0000 g | Freq: Once | INTRAVENOUS | Status: AC
  Administered 2023-08-18: 4 g via INTRAVENOUS
  Filled 2023-08-18: qty 100

## 2023-08-18 MED ORDER — INSULIN GLARGINE-YFGN 100 UNIT/ML ~~LOC~~ SOLN
8.0000 [IU] | Freq: Every day | SUBCUTANEOUS | Status: DC
  Administered 2023-08-18 – 2023-08-19 (×2): 8 [IU] via SUBCUTANEOUS
  Filled 2023-08-18 (×2): qty 0.08

## 2023-08-18 NOTE — Progress Notes (Signed)
5 Days Post-Op   Subjective/Chief Complaint: Passing more flatus, no BM. Some hypoglycemia overnight. Patient's pain managed by Dr. Mosetta Putt outpatient. He does not want to try adding scheduled tramadol. He seems resistant to increasing scheduled long acting pain medication. Prefers to be able to titrate pain control with oxycodone. He thinks his pain may be better with increasing mobilization today as well.    Objective: Vital signs in last 24 hours: Temp:  [98.4 F (36.9 C)-99.7 F (37.6 C)] 98.5 F (36.9 C) (11/24 0306) Pulse Rate:  [64-78] 71 (11/24 0404) Resp:  [18-19] 19 (11/24 0306) BP: (127-182)/(63-113) 169/83 (11/24 0404) SpO2:  [90 %-95 %] 95 % (11/24 0404) Last BM Date : 08/12/23  Intake/Output from previous day: 11/23 0701 - 11/24 0700 In: 480 [P.O.:480] Out: 1165 [Urine:1000; Drains:165] Intake/Output this shift: No intake/output data recorded.  General appearance: alert, cooperative, no distress Resp: breathing comfortably GI: soft, non distended.  Dressing c/d/I.  No bile staining in drain.  Serosang.  Extremities: extremities normal, atraumatic, no cyanosis or edema  Lab Results:  Recent Labs    08/17/23 0502 08/18/23 0546  WBC 10.5 9.8  HGB 12.0* 12.3*  HCT 36.0* 36.5*  PLT 311 362   BMET Recent Labs    08/17/23 0502 08/18/23 0546  NA 135 131*  K 3.5 3.7  CL 99 98  CO2 28 24  GLUCOSE 133* 171*  BUN 10 9  CREATININE 0.59* 0.63  CALCIUM 8.5* 8.3*   PT/INR No results for input(s): "LABPROT", "INR" in the last 72 hours. ABG No results for input(s): "PHART", "HCO3" in the last 72 hours.  Invalid input(s): "PCO2", "PO2"   Studies/Results: No results found.  Anti-infectives: Anti-infectives (From admission, onward)    Start     Dose/Rate Route Frequency Ordered Stop   08/13/23 2000  ciprofloxacin (CIPRO) IVPB 400 mg        400 mg 200 mL/hr over 60 Minutes Intravenous Every 12 hours 08/13/23 1543 08/13/23 2100   08/13/23 0600   ciprofloxacin (CIPRO) IVPB 400 mg        400 mg 200 mL/hr over 60 Minutes Intravenous On call to O.R. 08/13/23 0555 08/13/23 0825       Assessment/Plan: s/p Procedure(s): LAPAROSCOPY DIAGNOSTIC (N/A) HAND ASSISTED LAPAROSCOPIC SPLENECTOMY (N/A) CONVERTED TO OPEN DISTAL SUBTOTAL PANCREATECTOMY (N/A) PARTIAL GASTRECTOMY (N/A) PARTIAL SMALL BOWEL RESECTION (N/A)  IS, pulmonary toilet. Doing IS to 1000 - 1200 mL. Pt/ot Continue MS contin and prn dilaudid. Also has PRN PO oxy for pain control. Continue robaxin 1000 TID Soft diet. Continue bowel regimen Mobilize more today   TRH and Diabetes coordinator following for DM.  Await path, adenocarcinoma, clinical stage III, s/p neoadjuvant chemo.  Anticipate positive margins.    LOS: 5 days   Juliet Rude, [facility] 08/18/2023, 9:14 AM Please see Amion for pager number during day hours 7:00am-4:30pm

## 2023-08-18 NOTE — Consult Note (Signed)
Consult Note   Patient: Scott [name] [id number]:[id number] DOB: [birth year] DOA: 08/13/2023     5 DOS: the patient was seen and examined on 08/18/2023   Brief hospital course: Scott [name] is a 72 y.o. male with past medical history of  HTN, HLD, CAD, afib s/p ablation, DM, and pancreatic tail adenocarcinoma who was admitted on 11/18 for Whipple, which was performed on 11/19.   Assessment and Plan:  Pancreatic cancer s/p whipple S/p hand-assisted lap spleen converted to open distal subtotal pancreatectomy, partial gastrectomy, and partial small bowel resection with [name] on 11/19 Pathology is still pending Management per surgery Now off PCA, progressing He reports that he does not need the Dilaudid for pain but takes it because it is available He appears to be nearing readiness for discharge   Uncontrolled diabetes mellitus type 2, with long-term use of insulin Last hemoglobin A1c was 7.8 on 07/2023 On glargine 20 units daily at home Will likely have different insulin needs post-operatively and this may take some time to figure out; avoidance of hypoglycemia is a greater current goal than tight glycemic control Hold metformin, Jardiance - may need to continue to hold at time of discharge Discontinue glipizide Diabetic education consulted Continue gabapentin Glycemic goal 100-200 for now Given overnight hypoglycemia, will decrease glargine back to 8 units daily   HTN Continue amlodipine, valsartan, spironolactone   Afib Rate controlled without medication Resume Eliquis when ok with surgery   HLD Continue fenofibrate, rosuvastatin   Chronic pain I have reviewed this patient in the [place] Controlled Substances Reporting System.  He is receiving medications from two providers but appears to be taking them as prescribed. He is not at particularly high risk of opioid misuse, diversion, or overdose.  He reports taking 1/2 tablets of Oxy IR at home sublingually, which allows him  faster onset of pain relief and then he spaces them out differently than ordered         Procedures: Distal subtotal pancreatectomy, splenectomy, partial gastrectomy, partial small bowel resection   Antibiotics: Ciprofloxacin x 2 doses  30 Day Unplanned Readmission Risk Score    Flowsheet Row Admission (Current) from 08/13/2023 in [place] [facility]  30 Day Unplanned Readmission Risk Score (%) 21.88 Filed at 08/18/2023 0801       This score is the patient's risk of an unplanned readmission within 30 days of being discharged (0 -100%). The score is based on dignosis, age, lab data, medications, orders, and past utilization.   Low:  0-14.9   Medium: 15-21.9   High: 22-29.9   Extreme: 30 and above           Subjective: Pain is reasonably controlled with Oxy but he reports taking Dilaudid since it is available.  Drain is leaking some.  His wife is concerned about whether she will be able to care for him at home but he is feeling ready to go when surgery okays.   Objective: Vitals:   08/18/23 0400 08/18/23 0404  BP:  (!) 169/83  Pulse: 71 71  Resp:    Temp:    SpO2: 90% 95%    Intake/Output Summary (Last 24 hours) at 08/18/2023 0806 Last data filed at 08/18/2023 4132 Gross per 24 hour  Intake 480 ml  Output 1165 ml  Net -685 ml   Filed Weights   08/13/23 0552  Weight: 65.4 kg    Exam:  General:  Appears calm and comfortable and is in NAD Eyes:  EOMI, normal lids, iris ENT:  grossly normal hearing, lips & tongue, mmm Neck:  no LAD, masses or thyromegaly Cardiovascular:  RRR, no m/r/g. No LE edema.  Respiratory:   CTA bilaterally with no wheezes/rales/rhonchi.  Normal respiratory effort. Abdomen:  less drainage from drains today, abdomen appropriately tender and improving, dressing on midline C/D/I Skin:  no rash or induration seen on limited exam Musculoskeletal:  grossly normal tone BUE/BLE, good ROM, no bony abnormality Psychiatric:   appropriate mood and affect, speech fluent and appropriate, AOx3 Neurologic:  CN 2-12 grossly intact, moves all extremities in coordinated fashion  Data Reviewed: I have reviewed the patient's lab results since admission.  Pertinent labs for today include:   Na++ 131 Glucose 171; range 61-188 Mag++ 1.5 Albumin 2.5 WBC 9.8 Hgb 12.3     Family Communication: Wife was present throughout evaluation  Disposition: Status is: Inpatient Remains inpatient appropriate because: ongoing management     Time spent: 50 minutes  Unresulted Labs (From admission, onward)    None        Author: Jonah Blue, MD 08/18/2023 8:06 AM  For on call review [email or web address].

## 2023-08-18 NOTE — Plan of Care (Signed)
  Problem: Coping: Goal: Ability to adjust to condition or change in health will improve Outcome: Progressing   Problem: Nutritional: Goal: Maintenance of adequate nutrition will improve Outcome: Progressing   Problem: Skin Integrity: Goal: Risk for impaired skin integrity will decrease Outcome: Progressing   Problem: Activity: Goal: Risk for activity intolerance will decrease Outcome: Progressing   Problem: Elimination: Goal: Will not experience complications related to bowel motility Outcome: Progressing   Problem: Pain Management: Goal: General experience of comfort will improve Outcome: Progressing

## 2023-08-19 DIAGNOSIS — C252 Malignant neoplasm of tail of pancreas: Secondary | ICD-10-CM | POA: Diagnosis not present

## 2023-08-19 LAB — GLUCOSE, CAPILLARY
Glucose-Capillary: 96 mg/dL (ref 70–99)
Glucose-Capillary: 175 mg/dL — ABNORMAL HIGH (ref 70–99)
Glucose-Capillary: 132 mg/dL — ABNORMAL HIGH (ref 70–99)

## 2023-08-19 LAB — BASIC METABOLIC PANEL
Anion gap: 11 (ref 5–15)
BUN: 8 mg/dL (ref 8–23)
CO2: 24 mmol/L (ref 22–32)
Calcium: 8.4 mg/dL — ABNORMAL LOW (ref 8.9–10.3)
Chloride: 99 mmol/L (ref 98–111)
Creatinine, Ser: 0.59 mg/dL — ABNORMAL LOW (ref 0.61–1.24)
GFR, Estimated: >60 mL/min (ref >60)
Glucose, Bld: 155 mg/dL — ABNORMAL HIGH (ref 70–99)
Potassium: 3.7 mmol/L (ref 3.5–5.1)
Sodium: 134 mmol/L — ABNORMAL LOW (ref 135–145)

## 2023-08-19 LAB — SURGICAL PATHOLOGY

## 2023-08-19 MED ORDER — BASAGLAR KWIKPEN 100 UNIT/ML ~~LOC~~ SOPN: 8.0000 [IU] | PEN_INJECTOR | Freq: Every day | SUBCUTANEOUS | 1 refills | Status: DC

## 2023-08-19 NOTE — Consult Note (Signed)
Consult Note   Patient: Scott [name] HYQ:[id number] DOB: 19-Oct-1950 DOA: 08/13/2023     6 DOS: the patient was seen and examined on 08/19/2023   Brief hospital course: Scott [name] is a 72 y.o. male with past medical history of  HTN, HLD, CAD, afib s/p ablation, DM, and pancreatic tail adenocarcinoma who was admitted on 11/18 for Whipple, which was performed on 11/19.   Assessment and Plan:  Pancreatic cancer s/p whipple S/p hand-assisted lap spleen converted to open distal subtotal pancreatectomy, partial gastrectomy, and partial small bowel resection with [name] on 11/19 Pathology is still pending Management per surgery Now off PCA, progressing He reports that he does not need the Dilaudid for pain but takes it because it is available He appears to be nearing readiness for discharge - possibly home today   Uncontrolled diabetes mellitus type 2, with long-term use of insulin Last hemoglobin A1c was 7.8 on 07/2023 On glargine 20 units daily at home Will likely have different insulin needs post-operatively and this may take some time to figure out; avoidance of hypoglycemia is a greater current goal than tight glycemic control Hold metformin, Jardiance - recommend restarting the day following discharge Discontinue glipizide Diabetic education consulted Continue gabapentin Glycemic goal 100-200 for now Glargine currently at 8 units daily with reasonable control Likely to need further titration of glargine as an outpatient   HTN Continue amlodipine, valsartan, spironolactone   Afib Rate controlled without medication Resume Eliquis when ok with surgery   HLD Continue fenofibrate, rosuvastatin   Chronic pain I have reviewed this patient in the [place] Controlled Substances Reporting System.  He is receiving medications from two providers but appears to be taking them as prescribed. He is not at particularly high risk of opioid misuse, diversion, or overdose.  He reports  taking 1/2 tablets of Oxy IR at home sublingually, which allows him faster onset of pain relief and then he spaces them out differently than ordered         Procedures: Distal subtotal pancreatectomy, splenectomy, partial gastrectomy, partial small bowel resection   Antibiotics: Ciprofloxacin x 2 doses  30 Day Unplanned Readmission Risk Score    Flowsheet Row Admission (Current) from 08/13/2023 in [place] [facility]  30 Day Unplanned Readmission Risk Score (%) 22.35 Filed at 08/19/2023 0401       This score is the patient's risk of an unplanned readmission within 30 days of being discharged (0 -100%). The score is based on dignosis, age, lab data, medications, orders, and past utilization.   Low:  0-14.9   Medium: 15-21.9   High: 22-29.9   Extreme: 30 and above           Subjective: He reports frustration with his pain control regimen - he seems to think it is reasonable to decrease the Oxy dosing but allow as need Dilaudid despite not being on this regimen at home.  He also thinks he is ready for dc despite use of Dilaudid.   Objective: Vitals:   08/18/23 2325 08/19/23 0315  BP: 134/68 (!) 157/72  Pulse: 60 71  Resp: 17 18  Temp: 98.4 F (36.9 C) 98.1 F (36.7 C)  SpO2: 95% 92%    Intake/Output Summary (Last 24 hours) at 08/19/2023 0740 Last data filed at 08/19/2023 0527 Gross per 24 hour  Intake 340 ml  Output 1100 ml  Net -760 ml   Filed Weights   08/13/23 0552  Weight: 65.4 kg  Exam:  General:  Appears calm and comfortable and is in NAD, sitting up in chair Eyes:   EOMI, normal lids, iris ENT:  grossly normal hearing, lips & tongue, mmm Neck:  no LAD, masses or thyromegaly Cardiovascular:  RRR, no m/r/g. No LE edema.  Respiratory:   CTA bilaterally with no wheezes/rales/rhonchi.  Normal respiratory effort. Abdomen:  less drainage from drains today, abdomen appropriately tender and improving, dressing on midline C/D/I Skin:  no rash  or induration seen on limited exam Musculoskeletal:  grossly normal tone BUE/BLE, good ROM, no bony abnormality Psychiatric:  appropriate mood and affect, speech fluent and appropriate, AOx3 Neurologic:  CN 2-12 grossly intact, moves all extremities in coordinated fashion  Data Reviewed: I have reviewed the patient's lab results since admission.  Pertinent labs for today include:   Glucose 96-225      Family Communication: Wife was present throughout evaluation  Disposition: Likely home today, will await surgery input     Time spent: 50 minutes  Unresulted Labs (From admission, onward)    None        Author: Jonah Blue, MD 08/19/2023 7:40 AM  For on call review [email or web address].

## 2023-08-19 NOTE — Progress Notes (Addendum)
Order to discharge patient home. Discharge instructions/AVS given to and reviewed with patient + wife. Education provided as needed. Patient verbalized understanding. Patients' 2 PIV's removed by this RN. Chest port un-accessed. 2 JP drains left in place per MD. Specimen cup provided to patient + wife for daily monitoring of output. Daily emptying and maintenance of JP drains education completed. Patient wife will transport him home via private vehicle. All personal belongings sent home with patient (DME rolling walker included).

## 2023-08-19 NOTE — Progress Notes (Signed)
Physical Therapy Treatment Patient Details Name: Scott [name] MRN: [id number] DOB: 10-30-1950 Today's Date: 08/19/2023   History of Present Illness pt is a 72 yo male admitted 11/19 for S/p hand-assisted lap spleen converted to open distal subtotal pancreatectomy, partial gastrectomy, and partial small bowel resectionfollowing dx of adenocarcinoma of the pancreatic tail August 2024. PMH: DM, GERD, HLD, HTN, CA, Afib    PT Comments  Pt reporting moderate abdominal pain today, states he is not resting well and wants to go home. Pt ambulatory for short hallway distance, limited by abdominal cramping sensation. Pt simulated stair navigation with standing marches, PT explained need for guarding from wife and use of railing for stability and pt expresses understanding. Initially trialling gait without AD, benefits from use of RW for balance and energy conservation, would recommend one for home use. Will continue to follow.     If plan is discharge home, recommend the following: A little help with walking and/or transfers;A little help with bathing/dressing/bathroom;Assistance with cooking/housework;Help with stairs or ramp for entrance   Can travel by private vehicle        Equipment Recommendations  Rolling walker (2 wheels)    Recommendations for Other Services       Precautions / Restrictions Precautions Precautions: Fall Precaution Comments: x2 JP drains Restrictions Weight Bearing Restrictions: No     Mobility  Bed Mobility Overal bed mobility: Needs Assistance Bed Mobility: Sit to Supine       Sit to supine: Supervision   General bed mobility comments: for safety, slow to return to supine. cues for log roll    Transfers Overall transfer level: Needs assistance Equipment used: None Transfers: Sit to/from Stand Sit to Stand: Contact guard assist           General transfer comment: close guard for safety    Ambulation/Gait Ambulation/Gait assistance: Contact  guard assist Gait Distance (Feet): 150 Feet Assistive device: Rolling walker (2 wheels) Gait Pattern/deviations: Step-through pattern, Decreased stride length, Trunk flexed Gait velocity: decr     General Gait Details: initially trialling gait without AD, benefits from use of RW for balance and energy conservation   Stairs Stairs:  (standing marches x5 bilat)           Wheelchair Mobility     Tilt Bed    Modified Rankin (Stroke Patients Only)       Balance Overall balance assessment: Needs assistance Sitting-balance support: Bilateral upper extremity supported, Feet supported Sitting balance-Leahy Scale: Poor Sitting balance - Comments: supervision seated on EOB   Standing balance support: During functional activity, Bilateral upper extremity supported Standing balance-Leahy Scale: Poor Standing balance comment: benefits from UE support, can stand statically without support                            Cognition Arousal: Alert Behavior During Therapy: WFL for tasks assessed/performed Overall Cognitive Status: Within Functional Limits for tasks assessed                                          Exercises      General Comments        Pertinent Vitals/Pain Pain Assessment Pain Assessment: Faces Faces Pain Scale: Hurts little more Pain Location: abdomen at surgical site Pain Descriptors / Indicators: Sharp, Guarding, Shooting, Discomfort, Grimacing Pain Intervention(s): Limited activity within patient's tolerance, Monitored  during session, Repositioned    Home Living                          Prior Function            PT Goals (current goals can now be found in the care plan section) Acute Rehab PT Goals Patient Stated Goal: return home PT Goal Formulation: With patient Time For Goal Achievement: 08/28/23 Potential to Achieve Goals: Good Progress towards PT goals: Progressing toward goals    Frequency    Min  1X/week      PT Plan      Co-evaluation              AM-PAC PT "6 Clicks" Mobility   Outcome Measure  Help needed turning from your back to your side while in a flat bed without using bedrails?: A Little Help needed moving from lying on your back to sitting on the side of a flat bed without using bedrails?: A Little Help needed moving to and from a bed to a chair (including a wheelchair)?: A Little Help needed standing up from a chair using your arms (e.g., wheelchair or bedside chair)?: A Little Help needed to walk in hospital room?: A Little Help needed climbing 3-5 steps with a railing? : A Little 6 Click Score: 18    End of Session   Activity Tolerance: Patient tolerated treatment well Patient left: in bed;with call bell/phone within reach;with family/visitor present Nurse Communication: Mobility status PT Visit Diagnosis: Unsteadiness on feet (R26.81);Other abnormalities of gait and mobility (R26.89);Muscle weakness (generalized) (M62.81)     Time: 5784-6962 PT Time Calculation (min) (ACUTE ONLY): 10 min  Charges:    $Gait Training: 8-22 mins PT General Charges $$ ACUTE PT VISIT: 1 Visit                     Marye Round, PT DPT Acute Rehabilitation Services Secure Chat Preferred  Office [phone]    Nychelle Cassata E Christain [place] 08/19/2023, 10:45 AM

## 2023-08-19 NOTE — TOC Initial Note (Signed)
Transition of Care [facility]) - Initial/Assessment Note    Patient Details  Name: Scott [name] MRN: [id number] Date of Birth: 09/01/1951  [facility] CM/SW Contact:    Harriet Masson, RN Phone Number: 08/19/2023, 12:55 PM  Clinical Narrative:                 Spoke to patient regarding transition needs. Patient lives with wife who can transport him home.   Orders for Home Health and DME.  Choice offered an patient defers to [facility] to find [facility] agency.  Patient is agreeable to use in house provider, [facility], for DME needs.   Notified Mitch with [facility] of walker order.  Kandee Keen with [facility] accepted referral.   Address, Phone number and PCP verified.  TOC will continue to follow for needs.   Address, phone number and PCP confirmed.  Expected Discharge Plan: Home/Self Care Barriers to Discharge: Continued Medical Work up   Patient Goals and CMS Choice Patient states their goals for this hospitalization and ongoing recovery are:: return home CMS Medicare.gov Compare Post Acute Care list provided to:: Patient Choice offered to / list presented to : Patient      Expected Discharge Plan and Services     Post Acute Care Choice: Home Health, Durable Medical Equipment Living arrangements for the past 2 months: Single Family Home                 DME Arranged: Dan Humphreys DME Agency: [facility] Date DME Agency Contacted: 08/19/23 Time DME Agency Contacted: 83 Representative spoke with at DME Agency: Mitch HH Arranged: PT, RN HH Agency: [facility] Date [facility] Agency Contacted: 08/19/23 Time HH Agency Contacted: [phone] Representative spoke with at [facility] Agency: Kandee Keen  Prior Living Arrangements/Services Living arrangements for the past 2 months: Single Family Home Lives with:: Spouse Patient language and need for interpreter reviewed:: Yes Do you feel safe going back to the place where you live?: Yes      Need for Family Participation in Patient Care: Yes  (Comment) Care giver support system in place?: Yes (comment)   Criminal Activity/Legal Involvement Pertinent to Current Situation/Hospitalization: No - Comment as needed  Activities of Daily Living   ADL Screening (condition at time of admission) Independently performs ADLs?: No Does the patient have a NEW difficulty with bathing/dressing/toileting/self-feeding that is expected to last >3 days?: Yes (Initiates electronic notice to provider for possible OT consult) Does the patient have a NEW difficulty with getting in/out of bed, walking, or climbing stairs that is expected to last >3 days?: Yes (Initiates electronic notice to provider for possible PT consult) Does the patient have a NEW difficulty with communication that is expected to last >3 days?: No Is the patient deaf or have difficulty hearing?: No Does the patient have difficulty seeing, even when wearing glasses/contacts?: No Does the patient have difficulty concentrating, remembering, or making decisions?: No  Permission Sought/Granted Permission sought to share information with : Photographer granted to share info w AGENCY: Home health        Emotional Assessment Appearance:: Appears stated age Attitude/Demeanor/Rapport: Engaged Affect (typically observed): Accepting Orientation: : Oriented to Self, Oriented to Place, Oriented to  Time, Oriented to Situation Alcohol / Substance Use: Not Applicable Psych Involvement: No (comment)  Admission diagnosis:  Pancreatic cancer (HCC) [C25.9] Primary adenocarcinoma of body of pancreas (HCC) [C25.1] Patient Active Problem List   Diagnosis Date Noted   Primary adenocarcinoma  of body of pancreas (HCC) 08/13/2023   Port-A-Cath in place 05/16/2023   Pancreatic cancer (HCC) 05/02/2023   Cancer related pain 05/02/2023   Gait abnormality 04/12/2022   Cerebrovascular accident (CVA) (HCC) 04/12/2022   Weakness 04/12/2022   Stenosis of carotid  artery 04/12/2022   Chronic pain syndrome 10/19/2020   Colon spasm 10/19/2020   Epigastric pain 10/19/2020   Erectile dysfunction due to arterial insufficiency 10/19/2020   Gastroesophageal reflux disease with esophagitis 10/19/2020   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   Nausea 10/19/2020   Peripheral vascular disease (HCC) 10/19/2020   Pure hypercholesterolemia 10/19/2020   Tobacco dependence 10/19/2020   Type 2 diabetes mellitus without complication (HCC) 10/19/2020   Muscular deconditioning 03/23/2020   Fatigue of lower extremity 12/31/2019   Radiculopathy of lumbosacral region 12/31/2019   Weakness of both lower extremities 12/31/2019   Atherosclerosis of native artery of right lower extremity with intermittent claudication (HCC) 08/06/2019   Lumbar stenosis with neurogenic claudication 09/09/2018   Visit for monitoring Tikosyn therapy 03/03/2018   Stenosis of right carotid artery    Coronary artery disease due to lipid rich plaque    Preoperative cardiovascular examination    Internal carotid artery stenosis, right 12/04/2017   Atrial fibrillation (HCC) 12/16/2015   Essential hypertension 12/16/2015   Hyperlipidemia 12/16/2015   Arthritis 12/16/2015   Dyspnea on exertion 12/16/2015   Tobacco use disorder 12/16/2015   IBS (irritable bowel syndrome) 12/16/2015   CAD, NATIVE VESSEL 08/15/2010   PCP:  [name], MD Pharmacy:   [facility] [facility] [address] - [place], [place] - [address] [facility] [place] [address] Phone: [phone] Fax: [phone]  [id number] [place], [place] - [address] [facility] [address] [facility] [address]  [place] [address] Phone: [phone] Fax: [phone]     Social Determinants of Health (SDOH) Social History: SDOH Screenings   Food Insecurity: No Food Insecurity (08/13/2023)  Housing: Low Risk  (08/13/2023)  Transportation Needs: No Transportation Needs  (08/13/2023)  Utilities: Not At Risk (08/13/2023)  Tobacco Use: High Risk (08/13/2023)   SDOH Interventions:     Readmission Risk Interventions     No data to display

## 2023-08-19 NOTE — Progress Notes (Signed)
Occupational Therapy Treatment Patient Details Name: Scott [name] MRN: [id number] DOB: 04-13-1951 Today's Date: 08/19/2023   History of present illness pt is a 72 yo male admitted 11/19 for S/p hand-assisted lap spleen converted to open distal subtotal pancreatectomy, partial gastrectomy, and partial small bowel resectionfollowing dx of adenocarcinoma of the pancreatic tail August 2024. PMH: DM, GERD, HLD, HTN, CA, Afib   OT comments  Patient receiving pain meds upon entry. Patient able to get to EOB with supervision and ambulated to bathroom and transferred to toilet with CGA for safety. Patient able to perform grooming and UB bathing tasks standing at sink with supervision to CGA. Patient making good gains with self care tasks and functional transfers. Acute OT to continue to follow to address bathing, dressing, and mobility.       If plan is discharge home, recommend the following:  A little help with walking and/or transfers;A lot of help with bathing/dressing/bathroom;Assist for transportation;Help with stairs or ramp for entrance   Equipment Recommendations  BSC/3in1    Recommendations for Other Services      Precautions / Restrictions Precautions Precautions: Fall Precaution Comments: x2 JP drains Restrictions Weight Bearing Restrictions: No       Mobility Bed Mobility Overal bed mobility: Needs Assistance Bed Mobility: Rolling, Sidelying to Sit Rolling: Supervision Sidelying to sit: Supervision       General bed mobility comments: increased time due to pain    Transfers Overall transfer level: Needs assistance Equipment used: None Transfers: Sit to/from Stand Sit to Stand: Contact guard assist           General transfer comment: CGA for safety     Balance Overall balance assessment: Needs assistance Sitting-balance support: Feet supported Sitting balance-Leahy Scale: Poor Sitting balance - Comments: supervision seated on EOB   Standing balance  support: During functional activity, Bilateral upper extremity supported Standing balance-Leahy Scale: Poor Standing balance comment: able to stand at sink for self care tasks with supervision to CGA                           ADL either performed or assessed with clinical judgement   ADL Overall ADL's : Needs assistance/impaired     Grooming: Wash/dry hands;Wash/dry face;Oral care;Supervision/safety;Standing Grooming Details (indicate cue type and reason): at sink Upper Body Bathing: Supervision/ safety;Standing       Upper Body Dressing : Set up;Sitting   Lower Body Dressing: Minimal assistance   Toilet Transfer: Contact guard assist;Regular Toilet;Grab bars   Toileting- Clothing Manipulation and Hygiene: Supervision/safety;Sitting/lateral lean              Extremity/Trunk Assessment              Vision       Perception     Praxis      Cognition Arousal: Alert Behavior During Therapy: WFL for tasks assessed/performed Overall Cognitive Status: Within Functional Limits for tasks assessed                                          Exercises      Shoulder Instructions       General Comments      Pertinent Vitals/ Pain       Pain Assessment Pain Assessment: Faces Faces Pain Scale: Hurts even more Pain Location: abdomen at surgical site Pain Descriptors / Indicators:  Sharp, Guarding, Shooting, Discomfort, Grimacing Pain Intervention(s): Limited activity within patient's tolerance, Monitored during session, Repositioned, RN gave pain meds during session  Home Living                                          Prior Functioning/Environment              Frequency  Min 1X/week        Progress Toward Goals  OT Goals(current goals can now be found in the care plan section)  Progress towards OT goals: Progressing toward goals  Acute Rehab OT Goals Patient Stated Goal: go home OT Goal Formulation:  With patient Time For Goal Achievement: 08/29/23 Potential to Achieve Goals: Good ADL Goals Pt Will Perform Grooming: with supervision;standing Pt Will Perform Upper Body Bathing: with supervision;sitting Pt Will Perform Lower Body Dressing: with supervision;with adaptive equipment;sit to/from stand;sitting/lateral leans Pt Will Transfer to Toilet: with supervision;ambulating;bedside commode Pt Will Perform Toileting - Clothing Manipulation and hygiene: with supervision;sitting/lateral leans;sit to/from stand Additional ADL Goal #1: Pt will demonstrate improved bed mobility while transitioning from supine to sitting on EOB with SBA demonstrating carry over of modified log roll with SBA. Additional ADL Goal #2: Pt will demonstrate improved body awareness and core stability while maintaining sitting and standing balance requiring SBA during self care tasks completion.  Plan      Co-evaluation                 AM-PAC OT "6 Clicks" Daily Activity     Outcome Measure   Help from another person eating meals?: None Help from another person taking care of personal grooming?: A Little Help from another person toileting, which includes using toliet, bedpan, or urinal?: A Little Help from another person bathing (including washing, rinsing, drying)?: A Little Help from another person to put on and taking off regular upper body clothing?: A Little Help from another person to put on and taking off regular lower body clothing?: A Little 6 Click Score: 19    End of Session    OT Visit Diagnosis: Unsteadiness on feet (R26.81);Muscle weakness (generalized) (M62.81);Pain Pain - Right/Left: Left Pain - part of body:  (flank)   Activity Tolerance Patient tolerated treatment well;Patient limited by pain   Patient Left in chair;with call bell/phone within reach;with family/visitor present;Other (comment) (PT in room)   Nurse Communication Mobility status        Time: [id number] OT Time  Calculation (min): 27 min  Charges: OT General Charges $OT Visit: 1 Visit OT Treatments $Self Care/Home Management : 23-37 mins  Alfonse Flavors, OTA Acute Rehabilitation Services  Office [phone]   Dewain Penning 08/19/2023, 12:55 PM

## 2023-08-20 NOTE — Discharge Summary (Signed)
Physician Discharge Summary  KENNETH SLATTEN HQI:[id number] DOB: [birth year] DOA: 08/13/2023  PCP: [name], MD  Admit date: 08/13/2023 Discharge date: 08/19/2023  Recommendations for Outpatient Follow-up:   (include homehealth, outpatient follow-up instructions, specific recommendations for PCP to follow-up on, etc.)   Follow-up Information     Almond Lint, MD Follow up in 2 week(s).   Specialty: General Surgery Contact information: [address] [place] [address] [phone]         Care, [facility] Follow up.   Specialty: Home Health Services Why: Home health has been arranged. They will contact you to schedule apt. Contact information: [address] [place] [address] [phone]                Discharge Diagnoses:  Principal Problem:   Pancreatic cancer [facility]) Active Problems:   Primary adenocarcinoma of body of pancreas [facility])   Surgical Procedure: subtotal distal pancreatectomy  Discharge Condition: Good Disposition: Home  Diet recommendation: heart healthy diet   Hospital Course:  73 yo male presented for surgery. He underwent distal pancreatectomy with splenectomy. Post op he was admitted to progressive floor. He had pain issues managed by dilaudid PCa. He had bowel function POD 4. He improved his diet and was weaned off PCA and onto oral medications. He was discharged home POD 6.  Discharge Instructions  Discharge Instructions     Call MD for:  difficulty breathing, headache or visual disturbances   Complete by: As directed    Call MD for:  hives   Complete by: As directed    Call MD for:  persistant nausea and vomiting   Complete by: As directed    Call MD for:  redness, tenderness, or signs of infection (pain, swelling, redness, odor or green/yellow discharge around incision site)   Complete by: As directed    Call MD for:  severe uncontrolled pain   Complete by: As directed    Call  MD for:  temperature >100.4   Complete by: As directed    Change dressing (specify)   Complete by: As directed    Measure and record drain output 1-2 times per day and as needed. Dry dressing to drain site change as needed.   Diet - low sodium heart healthy   Complete by: As directed    Diet - low sodium heart healthy   Complete by: As directed    Discharge wound care:   Complete by: As directed    Ok to leave open to air. Cover drain area with bag for shower.   Increase activity slowly   Complete by: As directed    Increase activity slowly   Complete by: As directed       Allergies as of 08/19/2023       Reactions   Augmentin [amoxicillin-pot Clavulanate] Rash        Medication List     STOP taking these medications    glipiZIDE 5 MG 24 hr tablet Commonly known as: GLUCOTROL XL   oxyCODONE-acetaminophen 5-325 MG tablet Commonly known as: PERCOCET/ROXICET       TAKE these medications    acetaminophen 500 MG tablet Commonly known as: TYLENOL Take 2 tablets (1,000 mg total) by mouth every 6 (six) hours.   amLODipine 5 MG tablet Commonly known as: NORVASC Take 5 mg by mouth at bedtime.   apixaban 5 MG Tabs tablet Commonly known as: Eliquis Take 1 tablet (5 mg total) by mouth  2 (two) times daily.   aspirin EC 81 MG tablet Take 81 mg by mouth daily.   Basaglar KwikPen 100 UNIT/ML Inject 8 Units into the skin daily. What changed: how much to take   diazepam 2 MG tablet Commonly known as: VALIUM Take 2 mg by mouth 2 (two) times daily as needed for anxiety.   diclofenac Sodium 1 % Gel Commonly known as: VOLTAREN Apply 4 g topically 4 (four) times daily as needed (Up to 4x daily max).   esomeprazole 40 MG capsule Commonly known as: NEXIUM Take 40 mg by mouth daily as needed (heartburn/hiccups).   fenofibrate 48 MG tablet Commonly known as: TRICOR Take 48 mg by mouth daily.   gabapentin 300 MG capsule Commonly known as: NEURONTIN Take 900 mg by  mouth 2 (two) times daily.   Jardiance 25 MG Tabs tablet Generic drug: empagliflozin Take 25 mg by mouth daily.   metFORMIN 500 MG 24 hr tablet Commonly known as: GLUCOPHAGE-XR Take 1,000 mg by mouth daily after supper.   methocarbamol 750 MG tablet Commonly known as: ROBAXIN Take 1 tablet (750 mg total) by mouth every 8 (eight) hours as needed for muscle spasms.   morphine 15 MG 12 hr tablet Commonly known as: MS CONTIN Take 1 tablet (15 mg total) by mouth every 12 (twelve) hours. What changed: when to take this   nicotine 14 mg/24hr patch Commonly known as: NICODERM CQ - dosed in mg/24 hours Place 1 patch (14 mg total) onto the skin daily.   ondansetron 4 MG tablet Commonly known as: ZOFRAN Take 4 mg by mouth every 8 (eight) hours as needed for nausea or vomiting.   oxyCODONE 5 MG immediate release tablet Commonly known as: Oxy IR/ROXICODONE Take 1-3 tablets (5-15 mg total) by mouth every 4 (four) hours as needed for moderate pain (pain score 4-6) or breakthrough pain.   polyethylene glycol 17 g packet Commonly known as: MIRALAX / GLYCOLAX Take 17 g by mouth daily as needed for moderate constipation.   prochlorperazine 10 MG tablet Commonly known as: COMPAZINE Take 1 tablet (10 mg total) by mouth every 6 (six) hours as needed for nausea or vomiting (Use for nausea and / or vomiting unresolved with ondansetron (Zofran).).   rosuvastatin 40 MG tablet Commonly known as: CRESTOR Take 40 mg by mouth daily.   sildenafil 100 MG tablet Commonly known as: VIAGRA Take 100 mg by mouth as needed for erectile dysfunction.   spironolactone 25 MG tablet Commonly known as: ALDACTONE Take 25 mg by mouth daily.   valsartan 320 MG tablet Commonly known as: DIOVAN Take 320 mg by mouth daily.               Discharge Care Instructions  (From admission, onward)           Start     Ordered   08/19/23 0000  Discharge wound care:       Comments: Ok to leave open to  air. Cover drain area with bag for shower.   08/19/23 1703   08/16/23 0000  Change dressing (specify)       Comments: Measure and record drain output 1-2 times per day and as needed. Dry dressing to drain site change as needed.   08/16/23 1422            Follow-up Information     Almond Lint, MD Follow up in 2 week(s).   Specialty: General Surgery Contact information: [address] [place]  [place] [address] [phone]         [facility], [facility] Follow up.   Specialty: Home Health Services Why: Home health has been arranged. They will contact you to schedule apt. Contact information: [address] [place] [address] [phone]                  The results of significant diagnostics from this hospitalization (including imaging, microbiology, ancillary and laboratory) are listed below for reference.    Significant Diagnostic Studies: [place] FNA BX THYROID 1ST LESION AFIRMA  Result Date: 08/02/2023 INDICATION: Indeterminate thyroid nodule EXAM: ULTRASOUND GUIDED FINE NEEDLE ASPIRATION OF INDETERMINATE THYROID NODULE COMPARISON:  US Thyroid, 08/02/2023.  PET-CT, 05/20/2023 MEDICATIONS: None COMPLICATIONS: None immediate. TECHNIQUE: Informed written consent was obtained from the patient after a discussion of the risks, benefits and alternatives to treatment. Questions regarding the procedure were encouraged and answered. A timeout was performed prior to the initiation of the procedure. Pre-procedural ultrasound scanning demonstrated unchanged size and appearance of the indeterminate nodules within the The procedure was planned. The neck was prepped in the usual sterile fashion, and a sterile drape was applied covering the operative field. A timeout was performed prior to the initiation of the procedure. Local anesthesia was provided with 1% lidocaine. Under direct ultrasound guidance, 4 FNA biopsies were performed of the with a 25 gauge  needle. Multiple ultrasound images were saved for procedural documentation purposes. The samples were prepared and submitted to pathology. Under direct ultrasound guidance, 4 FNA biopsies were performed of the with a 25 gauge needle. Multiple ultrasound images were saved for procedural documentation purposes. The samples were prepared and submitted to pathology. Limited post procedural scanning was negative for hematoma or additional complication. Dressings were placed. The patient tolerated the above procedures procedure well without immediate postprocedural complication. FINDINGS: Nodule reference number based on prior diagnostic ultrasound: 1 Maximum size: 1.6 cm Location: RIGHT; Inferior ACR TI-RADS risk category: TR4 (4-6 points) Reason for biopsy: meets ACR TI-RADS criteria _________________________________________________________ Nodule reference number based on prior diagnostic ultrasound: 2 Maximum size: 1.2 cm Location: LEFT; Inferior ACR TI-RADS risk category: TR3 (3 points) Reason for biopsy: other.  Hypermetabolic on PET Ultrasound imaging confirms appropriate placement of the needles within the thyroid nodule. IMPRESSION: Successful ultrasound guided FNA biopsies of 1.6 cm RIGHT inferior TR-4 and 1.2 cm LEFT inferior TR-3 thyroid nodules. Roanna Banning, MD [facility] Radiology Electronically Signed   By: Roanna Banning M.D.   On: 08/02/2023 17:26   [place] FNA BX THYROID 1ST LESION AFIRMA  Result Date: 08/02/2023 INDICATION: Indeterminate thyroid nodule EXAM: ULTRASOUND GUIDED FINE NEEDLE ASPIRATION OF INDETERMINATE THYROID NODULE COMPARISON:  US Thyroid, 08/02/2023.  PET-CT, 05/20/2023 MEDICATIONS: None COMPLICATIONS: None immediate. TECHNIQUE: Informed written consent was obtained from the patient after a discussion of the risks, benefits and alternatives to treatment. Questions regarding the procedure were encouraged and answered. A timeout was performed  prior to the initiation of the procedure. Pre-procedural ultrasound scanning demonstrated unchanged size and appearance of the indeterminate nodules within the The procedure was planned. The neck was prepped in the usual sterile fashion, and a sterile drape was applied covering the operative field. A timeout was performed prior to the initiation of the procedure. Local anesthesia was provided with 1% lidocaine. Under direct ultrasound guidance, 4 FNA biopsies were performed of the with a 25 gauge needle. Multiple ultrasound images were saved for procedural documentation purposes. The samples were prepared and submitted to  pathology. Under direct ultrasound guidance, 4 FNA biopsies were performed of the with a 25 gauge needle. Multiple ultrasound images were saved for procedural documentation purposes. The samples were prepared and submitted to pathology. Limited post procedural scanning was negative for hematoma or additional complication. Dressings were placed. The patient tolerated the above procedures procedure well without immediate postprocedural complication. FINDINGS: Nodule reference number based on prior diagnostic ultrasound: 1 Maximum size: 1.6 cm Location: RIGHT; Inferior ACR TI-RADS risk category: TR4 (4-6 points) Reason for biopsy: meets ACR TI-RADS criteria _________________________________________________________ Nodule reference number based on prior diagnostic ultrasound: 2 Maximum size: 1.2 cm Location: LEFT; Inferior ACR TI-RADS risk category: TR3 (3 points) Reason for biopsy: other.  Hypermetabolic on PET Ultrasound imaging confirms appropriate placement of the needles within the thyroid nodule. IMPRESSION: Successful ultrasound guided FNA biopsies of 1.6 cm RIGHT inferior TR-4 and 1.2 cm LEFT inferior TR-3 thyroid nodules. Roanna Banning, MD [facility] Radiology Electronically Signed   By: Roanna Banning M.D.   On: 08/02/2023 17:26    Labs: Basic  Metabolic Panel: Recent Labs  Lab 08/13/23 1104 08/13/23 1143 08/13/23 1245 08/13/23 1655 08/14/23 0715 08/15/23 0701 08/16/23 0658 08/17/23 0502 08/18/23 0546 08/19/23 0634  NA 134* 134* 134*  --  132* 134* 134* 135 131* 134*  K 4.4 4.2 4.2  --  4.0 4.0 3.7 3.5 3.7 3.7  CL  --   --   --    < > 104 103 102 99 98 99  CO2  --   --   --    < > 20* 25 26 28 24 24   GLUCOSE  --   --   --    < > 175* 175* 161* 133* 171* 155*  BUN  --   --   --    < > 5* 6* 9 10 9 8   CREATININE  --   --   --    < > 0.65 0.78 0.66 0.59* 0.63 0.59*  CALCIUM  --   --   --    < > 8.1* 8.5* 8.5* 8.5* 8.3* 8.4*  MG  --   --   --   --  1.4* 1.5* 1.6* 1.6* 1.5*  --   PHOS  --   --   --   --  2.7 2.5 2.5 2.6 3.3  --    < > = values in this interval not displayed.   Liver Function Tests: Recent Labs  Lab 08/14/23 0715 08/15/23 0701 08/16/23 0658 08/17/23 0502 08/18/23 0546  AST 100* 86* 28 19 19   ALT 103* 116* 72* 51* 39  ALKPHOS 56 57 61 64 65  BILITOT 0.9 1.0 0.6 0.3 0.7  PROT 5.5* 5.3* 5.2* 5.4* 5.6*  ALBUMIN 3.1* 2.8* 2.5* 2.5* 2.5*   CBC: Recent Labs  Lab 08/13/23 1655 08/14/23 0715 08/15/23 0701 08/16/23 0658 08/17/23 0502 08/18/23 0546  WBC 14.2* 15.2* 16.7* 14.2* 10.5 9.8  HGB 11.7* 13.2 12.4* 11.9* 12.0* 12.3*  HCT 35.9* 39.3 37.8* 36.7* 36.0* 36.5*  MCV 90.7 90.1 90.0 90.8 88.5 89.7  PLT 224 220 209 215 311 362   CBG: Recent Labs  Lab 08/18/23 1636 08/18/23 2108 08/19/23 0739 08/19/23 1124 08/19/23 1545  GLUCAP 209* 148* 96 175* 132*   Principal Problem:   Pancreatic cancer (HCC) Active Problems:   Primary adenocarcinoma of body of pancreas (HCC)  Time coordinating discharge: 15 min

## 2023-08-23 ENCOUNTER — Telehealth: Payer: Self-pay | Admitting: Hematology

## 2023-08-23 ENCOUNTER — Other Ambulatory Visit: Payer: Self-pay | Admitting: Hematology

## 2023-08-23 ENCOUNTER — Telehealth: Payer: Self-pay

## 2023-08-23 MED ORDER — OXYCODONE-ACETAMINOPHEN 5-325 MG PO TABS: 1.0000 | ORAL_TABLET | Freq: Four times a day (QID) | ORAL | 0 refills | Status: DC | PRN

## 2023-08-23 NOTE — Telephone Encounter (Signed)
Patient called in he is in need of getting his pain medication refilled. He had his surgery  and has tried to call the surgeons office they are closed for the holiday. Can you refill his oxycodone Percocet for him. The pain medicine they wrote for him after his surgery the pharmacy can not fill because they need to talk to who prescribed and the office is close. He stated he wanted what you gave him last it was a larger pill he could cut in half and take half at first then take the other half if he needs it.

## 2023-08-24 ENCOUNTER — Other Ambulatory Visit: Payer: Self-pay

## 2023-08-25 NOTE — Progress Notes (Unsigned)
Patient Care Team: [name], MD as PCP - General [name], [name], MD as PCP - Cardiology (Cardiology) [name], MD as Consulting Physician (Hematology)  Clinic Day:  08/27/2023  Referring physician: [name], MD  ASSESSMENT & PLAN:   Assessment & Plan: Pancreatic cancer (HCC) -cT3N0M0, stage IIA --Patient presented with abdominal pain and weight loss, CT scan revealed a 4.4 x 3.5 cm ill-defined mass in the pancreatic tail, which 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  -baseline CA19.9 872 -EUS pancreatic mass biopsy confirmed adenocarcinoma. -He has seen surgeon [name] -We reviewed his case in tumor board, and recommended neoadjuvant chemotherapy.  Will repeat CT with pancreatic cancer protocol after neoadjuvant chemo. -Staging PET scan from 8/26 reviewed, no metastatic disease, there is some mild uptake in the periaortic lymph node, will monitor in the future scan.  I also discussed incidental finding of thyroid nodule, I ordered thyroid ultrasound for evaluation. -He started first cycle of chemotherapy FOLFIRINOX on May 16, 2023. He tolerated first cycle moderately well with fatigue, cold sensitivity and abdominal pain etc, discussed management of side effect. -Due to significant weight loss and fatigue after first cycle chemo,  his oxaliplatin and irinotecan doses were reduced from cycle 2. -He has not tolerated chemo well, his CA19.9 has not changed much since he started chemo.  The option of changing treatment to gemcitabine and abraxane was discussed.  -Restaging CT scan from July 04, 2023 showed partial response.  Patient would like to proceed with surgery next if possible. 08/13/2023 -patient had open distal Sub total pancreatectomy, partial gastrectomy, and partial small bowel resection.  He is followed by [name], [facility] surgery. -- He is scheduled to have staples removed tomorrow.  Has follow-up  appointment with [name] on Friday, 08/30/2023.   Plan: Labs reviewed  -CBC showing WBC 10.8; Hgb 14.1; Hct 42.4; Plt 1000; Anc 7.5 -CMP - K 4.4; glucose 334; BUN 19; Creatinine 0.91; eGFR > 60; Ca 9.4; LFTs normal.   Unclear cause of patient's severe elevation of platelets.  Plan to recheck labs in 1 week and again in 2 weeks. Follow-up in 2 weeks. Patient to have staples removed tomorrow and follow-up with surgeon, [name] on Friday, 08/30/2023. Recommend he continue to take pain medications as needed and as prescribed.  The patient understands the plans discussed today and is in agreement with them.  He knows to contact our office if he develops concerns prior to his next appointment.  I provided 25 minutes of face-to-face time during this encounter and > 50% was spent counseling as documented under my assessment and plan.    Carlean Jews, NP  [facility] - A DEPT OF [facility] [address] [place] [address] Dept: [phone] Dept Fax: [phone]   No orders of the defined types were placed in this encounter.     CHIEF COMPLAINT:  CC: Malignant neoplasm of tail of pancreas  Current Treatment: recent surgery  INTERVAL HISTORY:  Taboris is here today for repeat clinical assessment.  He last saw Dr. Blake Divine on 08/01/2023.  Was to have consultation with surgery and have follow-up with med/onc after surgery to discuss further treatment. 08/13/2023 -patient had open distal Sub total pancreatectomy, partial gastrectomy, and partial small bowel resection.  He is followed by [name], [facility] surgery.  The patient reports having severe pain from surgery.  Really unable  to get comfortable in any position.  Now having back pain in addition to abdominal pain.  Has very little appetite if any.  Weeks.  He is supplementing meals with Ensure protein shakes.  Has very little nausea and vomiting.  Reports  normal bowel movements, approximately every other day.  Denies blood in the stool.  Denies diarrhea.  He denies fevers or chills. His weight has decreased 11 pounds over last 3 weeks .  I have reviewed the past medical history, past surgical history, social history and family history with the patient and they are unchanged from previous note.  ALLERGIES:  is allergic to augmentin [amoxicillin-pot clavulanate].  MEDICATIONS:  Current Outpatient Medications  Medication Sig Dispense Refill   acetaminophen (TYLENOL) 500 MG tablet Take 2 tablets (1,000 mg total) by mouth every 6 (six) hours. 30 tablet 0   amLODipine (NORVASC) 5 MG tablet Take 5 mg by mouth at bedtime.     apixaban (ELIQUIS) 5 MG TABS tablet Take 1 tablet (5 mg total) by mouth 2 (two) times daily. 60 tablet 5   aspirin EC 81 MG tablet Take 81 mg by mouth daily.     diazepam (VALIUM) 2 MG tablet Take 2 mg by mouth 2 (two) times daily as needed for anxiety.     diclofenac Sodium (VOLTAREN) 1 % GEL Apply 4 g topically 4 (four) times daily as needed (Up to 4x daily max).     esomeprazole (NEXIUM) 40 MG capsule Take 40 mg by mouth daily as needed (heartburn/hiccups).     fenofibrate (TRICOR) 48 MG tablet Take 48 mg by mouth daily.     gabapentin (NEURONTIN) 300 MG capsule Take 900 mg by mouth 2 (two) times daily.     Insulin Glargine (BASAGLAR KWIKPEN) 100 UNIT/ML Inject 8 Units into the skin daily. 15 mL 1   JARDIANCE 25 MG TABS tablet Take 25 mg by mouth daily.     metFORMIN (GLUCOPHAGE-XR) 500 MG 24 hr tablet Take 1,000 mg by mouth daily after supper.     methocarbamol (ROBAXIN) 750 MG tablet Take 1 tablet (750 mg total) by mouth every 8 (eight) hours as needed for muscle spasms. 30 tablet 2   morphine (MS CONTIN) 15 MG 12 hr tablet Take 1 tablet (15 mg total) by mouth every 12 (twelve) hours. (Patient taking differently: Take 15 mg by mouth at bedtime.) 60 tablet 0   nicotine (NICODERM CQ - DOSED IN MG/24 HOURS) 14 mg/24hr patch  Place 1 patch (14 mg total) onto the skin daily. 28 patch 0   ondansetron (ZOFRAN) 4 MG tablet Take 4 mg by mouth every 8 (eight) hours as needed for nausea or vomiting.     oxyCODONE (OXY IR/ROXICODONE) 5 MG immediate release tablet Take 1-3 tablets (5-15 mg total) by mouth every 4 (four) hours as needed for moderate pain (pain score 4-6) or breakthrough pain. 80 tablet 0   oxyCODONE-acetaminophen (PERCOCET/ROXICET) 5-325 MG tablet Take 1 tablet by mouth every 6 (six) hours as needed for severe pain (pain score 7-10). 40 tablet 0   polyethylene glycol (MIRALAX / GLYCOLAX) 17 g packet Take 17 g by mouth daily as needed for moderate constipation.     prochlorperazine (COMPAZINE) 10 MG tablet Take 1 tablet (10 mg total) by mouth every 6 (six) hours as needed for nausea or vomiting (Use for nausea and / or vomiting unresolved with ondansetron (Zofran).). 30 tablet 0   rosuvastatin (CRESTOR) 40 MG tablet Take 40 mg by mouth daily.  sildenafil (VIAGRA) 100 MG tablet Take 100 mg by mouth as needed for erectile dysfunction.     spironolactone (ALDACTONE) 25 MG tablet Take 25 mg by mouth daily.     valsartan (DIOVAN) 320 MG tablet Take 320 mg by mouth daily.     No current facility-administered medications for this visit.    HISTORY OF PRESENT ILLNESS:   Oncology History Overview Note   Cancer Staging  Pancreatic cancer [facility]) Staging form: Exocrine Pancreas, AJCC 8th Edition - Clinical stage from 05/01/2023: Stage III (cT3, cN2, cM0) - Signed by [name], MD on 05/03/2023     Pancreatic cancer (HCC)  05/01/2023 Cancer Staging   Staging form: Exocrine Pancreas, AJCC 8th Edition - Clinical stage from 05/01/2023: Stage III (cT3, cN2, cM0) - Signed by [name], MD on 05/03/2023   05/02/2023 Initial Diagnosis   Pancreatic cancer (HCC)   05/16/2023 - 06/28/2023 Chemotherapy   Patient is on Treatment Plan : PANCREAS Modified FOLFIRINOX q14d x 4 cycles     07/09/2023 -  Chemotherapy   Patient is on  Treatment Plan : PANCREATIC Abraxane D1,8,15 + Gemcitabine D1,8,15 q28d     08/13/2023 Surgery   Open distal sub total pancreatectomy, partial gastrectomy, and partial small bowel resection. Dr. [name] [facility]       REVIEW OF SYSTEMS:   Constitutional: Denies fevers or chills. Has very little appetite since surgery. Has lost 11 pounds in past two weeks.  Eyes: Denies blurriness of vision Ears, nose, mouth, throat, and face: Denies mucositis or sore throat Respiratory: Denies cough, dyspnea or wheezes Cardiovascular: Denies palpitation, chest discomfort or lower extremity swelling Gastrointestinal:  Denies nausea, vomiting, constipation, or diarrhea.  States he is having a bowel movement approximately every other day.  Having severe abdominal pain. Skin: Denies abnormal skin rashes Lymphatics: Denies new lymphadenopathy or easy bruising Neurological:Denies numbness, tingling or new weaknesses Behavioral/Psych: Mood is stable, no new changes  All other systems were reviewed with the patient and are negative.   VITALS:   Today's Vitals   08/27/23 1316  BP: 113/66  Pulse: 96  Resp: 17  Temp: 98.1 F (36.7 C)  TempSrc: Temporal  SpO2: 97%  Weight: 133 lb 14.4 oz (60.7 kg)   Body mass index is 19.21 kg/m.   Wt Readings from Last 3 Encounters:  08/27/23 133 lb 14.4 oz (60.7 kg)  08/13/23 144 lb 1.6 oz (65.4 kg)  08/08/23 144 lb 1.6 oz (65.4 kg)    Body mass index is 19.21 kg/m.  Performance status (ECOG): 3 - Symptomatic, >50% confined to bed  PHYSICAL EXAM:   GENERAL:alert, no distress.  Patient restless and generally uncomfortable. SKIN: skin color, texture, turgor are normal, no rashes or significant lesions EYES: normal, Conjunctiva are pink and non-injected, sclera clear OROPHARYNX:no exudate, no erythema and lips, buccal mucosa, and tongue normal  NECK: supple, thyroid normal size, non-tender, without nodularity LYMPH:  no palpable  lymphadenopathy in the cervical, axillary or inguinal LUNGS: clear to auscultation and percussion with normal breathing effort HEART: regular rate & rhythm and no murmurs and no lower extremity edema ABDOMEN: Midline staples along the vertical surgical incision.  Appears to be healing well.  No redness, no warmth, no drainage from the incision site.  2 drainage tubes on left lower quadrant of the abdomen are draining small to medium amount of serosanguineous fluid.  Fluid from drain #1 appears more yellow.  Fluid from drain #2 appears more blood-tinged.  Moderate tenderness with palpation  over the left lower quadrant and left mid abdomen. Musculoskeletal:no cyanosis of digits and no clubbing  NEURO: alert & oriented x 3 with fluent speech, no focal motor/sensory deficits  LABORATORY DATA:  I have reviewed the data as listed    Component Value Date/Time   NA 137 08/27/2023 1236   NA 140 01/17/2021 1402   K 4.4 08/27/2023 1236   CL 100 08/27/2023 1236   CO2 25 08/27/2023 1236   GLUCOSE 334 (H) 08/27/2023 1236   BUN 19 08/27/2023 1236   BUN 25 01/17/2021 1402   CREATININE 0.91 08/27/2023 1236   CREATININE 1.05 07/23/2016 0929   CALCIUM 9.4 08/27/2023 1236   PROT 6.9 08/27/2023 1236   PROT 6.5 10/23/2017 0933   ALBUMIN 3.7 08/27/2023 1236   ALBUMIN 4.2 10/23/2017 0933   AST 20 08/27/2023 1236   ALT 22 08/27/2023 1236   ALKPHOS 106 08/27/2023 1236   BILITOT 0.4 08/27/2023 1236   GFRNONAA >60 08/27/2023 1236   GFRAA >60 09/03/2018 1527    Lab Results  Component Value Date   WBC 10.8 (H) 08/27/2023   NEUTROABS 7.5 08/27/2023   HGB 14.1 08/27/2023   HCT 42.4 08/27/2023   MCV 90.0 08/27/2023   PLT 1,000 (HH) 08/27/2023       RADIOGRAPHIC STUDIES: [place] FNA BX THYROID 1ST LESION AFIRMA  Result Date: 08/02/2023 INDICATION: Indeterminate thyroid nodule EXAM: ULTRASOUND GUIDED FINE NEEDLE ASPIRATION OF INDETERMINATE THYROID NODULE COMPARISON:  US Thyroid, 08/02/2023.  PET-CT,  05/20/2023 MEDICATIONS: None COMPLICATIONS: None immediate. TECHNIQUE: Informed written consent was obtained from the patient after a discussion of the risks, benefits and alternatives to treatment. Questions regarding the procedure were encouraged and answered. A timeout was performed prior to the initiation of the procedure. Pre-procedural ultrasound scanning demonstrated unchanged size and appearance of the indeterminate nodules within the The procedure was planned. The neck was prepped in the usual sterile fashion, and a sterile drape was applied covering the operative field. A timeout was performed prior to the initiation of the procedure. Local anesthesia was provided with 1% lidocaine. Under direct ultrasound guidance, 4 FNA biopsies were performed of the with a 25 gauge needle. Multiple ultrasound images were saved for procedural documentation purposes. The samples were prepared and submitted to pathology. Under direct ultrasound guidance, 4 FNA biopsies were performed of the with a 25 gauge needle. Multiple ultrasound images were saved for procedural documentation purposes. The samples were prepared and submitted to pathology. Limited post procedural scanning was negative for hematoma or additional complication. Dressings were placed. The patient tolerated the above procedures procedure well without immediate postprocedural complication. FINDINGS: Nodule reference number based on prior diagnostic ultrasound: 1 Maximum size: 1.6 cm Location: RIGHT; Inferior ACR TI-RADS risk category: TR4 (4-6 points) Reason for biopsy: meets ACR TI-RADS criteria _________________________________________________________ Nodule reference number based on prior diagnostic ultrasound: 2 Maximum size: 1.2 cm Location: LEFT; Inferior ACR TI-RADS risk category: TR3 (3 points) Reason for biopsy: other.  Hypermetabolic on PET Ultrasound imaging confirms appropriate placement of the needles within the thyroid nodule. IMPRESSION:  Successful ultrasound guided FNA biopsies of 1.6 cm RIGHT inferior TR-4 and 1.2 cm LEFT inferior TR-3 thyroid nodules. Roanna Banning, MD [facility] Radiology Electronically Signed   By: Roanna Banning M.D.   On: 08/02/2023 17:26   [place] FNA BX THYROID 1ST LESION AFIRMA  Result Date: 08/02/2023 INDICATION: Indeterminate thyroid nodule EXAM: ULTRASOUND GUIDED FINE NEEDLE ASPIRATION OF INDETERMINATE THYROID NODULE COMPARISON:  US Thyroid, 08/02/2023.  PET-CT, 05/20/2023 MEDICATIONS: None COMPLICATIONS: None immediate. TECHNIQUE: Informed written consent was obtained from the patient after a discussion of the risks, benefits and alternatives to treatment. Questions regarding the procedure were encouraged and answered. A timeout was performed prior to the initiation of the procedure. Pre-procedural ultrasound scanning demonstrated unchanged size and appearance of the indeterminate nodules within the The procedure was planned. The neck was prepped in the usual sterile fashion, and a sterile drape was applied covering the operative field. A timeout was performed prior to the initiation of the procedure. Local anesthesia was provided with 1% lidocaine. Under direct ultrasound guidance, 4 FNA biopsies were performed of the with a 25 gauge needle. Multiple ultrasound images were saved for procedural documentation purposes. The samples were prepared and submitted to pathology. Under direct ultrasound guidance, 4 FNA biopsies were performed of the with a 25 gauge needle. Multiple ultrasound images were saved for procedural documentation purposes. The samples were prepared and submitted to pathology. Limited post procedural scanning was negative for hematoma or additional complication. Dressings were placed. The patient tolerated the above procedures procedure well without immediate postprocedural complication. FINDINGS: Nodule reference number based on prior diagnostic ultrasound:  1 Maximum size: 1.6 cm Location: RIGHT; Inferior ACR TI-RADS risk category: TR4 (4-6 points) Reason for biopsy: meets ACR TI-RADS criteria _________________________________________________________ Nodule reference number based on prior diagnostic ultrasound: 2 Maximum size: 1.2 cm Location: LEFT; Inferior ACR TI-RADS risk category: TR3 (3 points) Reason for biopsy: other.  Hypermetabolic on PET Ultrasound imaging confirms appropriate placement of the needles within the thyroid nodule. IMPRESSION: Successful ultrasound guided FNA biopsies of 1.6 cm RIGHT inferior TR-4 and 1.2 cm LEFT inferior TR-3 thyroid nodules. Roanna Banning, MD [facility] Radiology Electronically Signed   By: Roanna Banning M.D.   On: 08/02/2023 17:26    Addendum I have seen the patient, examined him. I agree with the assessment and and plan and have edited the notes.   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  Unfortunately multiple liver nodule was noticed during the surgery, and all 3 liver biopsy showed metastatic disease.  Tumor superior mesenteric vein margin was positive, 1 out of 28 lymph nodes were positive.  He had a partial response to neoadjuvant chemotherapy.  I reviewed his surgical path with patient and his wife, unfortunately, it is almost certain that he will have recurrence after surgery due to the metastatic nature of his disease and positive margin.  Patient is still struggling with recovery from surgery, with severe abdominal pain and low appetite, along with weight loss.  He is going to follow-up with [name] next week.  Adjuvant chemotherapy will be recommended when he recovers while he enough, if he is able to tolerate.  He is certainly not ready for chemo now.  Plan to see him back in 2 weeks for reevaluation.  All questions were answered.  I spent a total of 25 minutes for his visit today, more than 50% time on face-to-face  counseling.  [name] MD 08/27/2023

## 2023-08-25 NOTE — Assessment & Plan Note (Addendum)
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  -baseline CA19.9 872 -EUS pancreatic mass biopsy confirmed adenocarcinoma. -He has seen surgeon [name] -We reviewed his case in tumor board, and recommended neoadjuvant chemotherapy.  Will repeat CT with pancreatic cancer protocol after neoadjuvant chemo. -Staging PET scan from 8/26 reviewed, no metastatic disease, there is some mild uptake in the periaortic lymph node, will monitor in the future scan.  I also discussed incidental finding of thyroid nodule, I ordered thyroid ultrasound for evaluation. -He started first cycle of chemotherapy FOLFIRINOX on May 16, 2023. He tolerated first cycle moderately well with fatigue, cold sensitivity and abdominal pain etc, discussed management of side effect. -Due to significant weight loss and fatigue after first cycle chemo,  his oxaliplatin and irinotecan doses were reduced from cycle 2. -He has not tolerated chemo well, his CA19.9 has not changed much since he started chemo.  The option of changing treatment to gemcitabine and abraxane was discussed.  -Restaging CT scan from July 04, 2023 showed partial response.  Patient would like to proceed with surgery next if possible. 08/13/2023 -patient had open distal Sub total pancreatectomy, partial gastrectomy, and partial small bowel resection.  He is followed by [name], [facility] surgery. -- He is scheduled to have staples removed tomorrow.  Has follow-up appointment with [name] on Friday, 08/30/2023.

## 2023-08-26 ENCOUNTER — Other Ambulatory Visit: Payer: Self-pay

## 2023-08-26 DIAGNOSIS — C252 Malignant neoplasm of tail of pancreas: Secondary | ICD-10-CM

## 2023-08-27 ENCOUNTER — Telehealth: Payer: Self-pay

## 2023-08-27 ENCOUNTER — Inpatient Hospital Stay: Payer: Medicare Other | Attending: Hematology

## 2023-08-27 ENCOUNTER — Encounter: Payer: Self-pay | Admitting: Nurse Practitioner

## 2023-08-27 ENCOUNTER — Inpatient Hospital Stay (HOSPITAL_BASED_OUTPATIENT_CLINIC_OR_DEPARTMENT_OTHER): Payer: Medicare Other | Admitting: Nurse Practitioner

## 2023-08-27 ENCOUNTER — Encounter (HOSPITAL_COMMUNITY): Payer: Self-pay

## 2023-08-27 VITALS — BP 113/66 | HR 96 | Temp 98.1°F | Resp 17 | Wt 133.9 lb

## 2023-08-27 DIAGNOSIS — R531 Weakness: Secondary | ICD-10-CM | POA: Insufficient documentation

## 2023-08-27 DIAGNOSIS — D75839 Thrombocytosis, unspecified: Secondary | ICD-10-CM | POA: Insufficient documentation

## 2023-08-27 DIAGNOSIS — G8918 Other acute postprocedural pain: Secondary | ICD-10-CM | POA: Diagnosis not present

## 2023-08-27 DIAGNOSIS — C787 Secondary malignant neoplasm of liver and intrahepatic bile duct: Secondary | ICD-10-CM | POA: Insufficient documentation

## 2023-08-27 DIAGNOSIS — R630 Anorexia: Secondary | ICD-10-CM | POA: Insufficient documentation

## 2023-08-27 DIAGNOSIS — C252 Malignant neoplasm of tail of pancreas: Secondary | ICD-10-CM

## 2023-08-27 DIAGNOSIS — R634 Abnormal weight loss: Secondary | ICD-10-CM | POA: Insufficient documentation

## 2023-08-27 DIAGNOSIS — Z95828 Presence of other vascular implants and grafts: Secondary | ICD-10-CM

## 2023-08-27 DIAGNOSIS — K8689 Other specified diseases of pancreas: Secondary | ICD-10-CM

## 2023-08-27 LAB — CMP (CANCER CENTER ONLY)
ALT: 22 U/L (ref 0–44)
AST: 20 U/L (ref 15–41)
Albumin: 3.7 g/dL (ref 3.5–5.0)
Alkaline Phosphatase: 106 U/L (ref 38–126)
Anion gap: 12 (ref 5–15)
BUN: 19 mg/dL (ref 8–23)
CO2: 25 mmol/L (ref 22–32)
Calcium: 9.4 mg/dL (ref 8.9–10.3)
Chloride: 100 mmol/L (ref 98–111)
Creatinine: 0.91 mg/dL (ref 0.61–1.24)
GFR, Estimated: >60 mL/min (ref >60)
Glucose, Bld: 334 mg/dL — ABNORMAL HIGH (ref 70–99)
Potassium: 4.4 mmol/L (ref 3.5–5.1)
Sodium: 137 mmol/L (ref 135–145)
Total Bilirubin: 0.4 mg/dL (ref <1.2)
Total Protein: 6.9 g/dL (ref 6.5–8.1)

## 2023-08-27 LAB — CBC WITH DIFFERENTIAL (CANCER CENTER ONLY)
Abs Immature Granulocytes: 0.04 10*3/uL (ref 0.00–0.07)
Basophils Absolute: 0.1 10*3/uL (ref 0.0–0.1)
Basophils Relative: 1 %
Eosinophils Absolute: 0.6 10*3/uL — ABNORMAL HIGH (ref 0.0–0.5)
Eosinophils Relative: 5 %
HCT: 42.4 % (ref 39.0–52.0)
Hemoglobin: 14.1 g/dL (ref 13.0–17.0)
Immature Granulocytes: 0 %
Lymphocytes Relative: 16 %
Lymphs Abs: 1.7 10*3/uL (ref 0.7–4.0)
MCH: 29.9 pg (ref 26.0–34.0)
MCHC: 33.3 g/dL (ref 30.0–36.0)
MCV: 90 fL (ref 80.0–100.0)
Monocytes Absolute: 0.9 10*3/uL (ref 0.1–1.0)
Monocytes Relative: 8 %
Neutro Abs: 7.5 10*3/uL (ref 1.7–7.7)
Neutrophils Relative %: 70 %
Platelet Count: 1000 10*3/uL (ref 150–400)
RBC: 4.71 MIL/uL (ref 4.22–5.81)
RDW: 14.7 % (ref 11.5–15.5)
WBC Count: 10.8 10*3/uL — ABNORMAL HIGH (ref 4.0–10.5)
nRBC: 0 % (ref 0.0–0.2)

## 2023-08-27 MED ORDER — HEPARIN SOD (PORK) LOCK FLUSH 100 UNIT/ML IV SOLN
500.0000 [IU] | Freq: Once | INTRAVENOUS | Status: AC
  Administered 2023-08-27: 500 [IU]

## 2023-08-27 MED ORDER — SODIUM CHLORIDE 0.9% FLUSH
10.0000 mL | Freq: Once | INTRAVENOUS | Status: AC
  Administered 2023-08-27: 10 mL

## 2023-08-27 NOTE — Telephone Encounter (Signed)
CRITICAL VALUE STICKER  CRITICAL VALUE: platelets=1000  RECEIVER (on-site recipient of call): A York, RN  DATE & TIME NOTIFIED: 08/27/23 at 1:04 pm  MESSENGER (representative from lab):Melissa   MD NOTIFIED: Anice Paganini, NP  TIME OF NOTIFICATION:1:05 pm   RESPONSE:

## 2023-08-28 ENCOUNTER — Encounter: Payer: Self-pay | Admitting: Hematology

## 2023-08-28 LAB — CANCER ANTIGEN 19-9: CA 19-9: 225 U/mL — ABNORMAL HIGH (ref 0–35)

## 2023-08-29 ENCOUNTER — Telehealth: Payer: Self-pay | Admitting: Hematology

## 2023-08-30 ENCOUNTER — Other Ambulatory Visit: Payer: Self-pay

## 2023-09-03 ENCOUNTER — Other Ambulatory Visit: Payer: Self-pay | Admitting: Nurse Practitioner

## 2023-09-03 ENCOUNTER — Inpatient Hospital Stay: Payer: Medicare Other

## 2023-09-03 ENCOUNTER — Other Ambulatory Visit: Payer: Self-pay

## 2023-09-03 ENCOUNTER — Other Ambulatory Visit: Payer: Self-pay | Admitting: Hematology

## 2023-09-03 ENCOUNTER — Telehealth: Payer: Self-pay

## 2023-09-03 VITALS — BP 103/66 | HR 98 | Temp 98.3°F | Resp 18

## 2023-09-03 DIAGNOSIS — C252 Malignant neoplasm of tail of pancreas: Secondary | ICD-10-CM

## 2023-09-03 DIAGNOSIS — Z95828 Presence of other vascular implants and grafts: Secondary | ICD-10-CM

## 2023-09-03 LAB — CMP (CANCER CENTER ONLY)
ALT: 17 U/L (ref 0–44)
AST: 13 U/L — ABNORMAL LOW (ref 15–41)
Albumin: 3.7 g/dL (ref 3.5–5.0)
Alkaline Phosphatase: 105 U/L (ref 38–126)
Anion gap: 11 (ref 5–15)
BUN: 32 mg/dL — ABNORMAL HIGH (ref 8–23)
CO2: 24 mmol/L (ref 22–32)
Calcium: 9.6 mg/dL (ref 8.9–10.3)
Chloride: 100 mmol/L (ref 98–111)
Creatinine: 1.16 mg/dL (ref 0.61–1.24)
GFR, Estimated: >60 mL/min (ref >60)
Glucose, Bld: 286 mg/dL — ABNORMAL HIGH (ref 70–99)
Potassium: 4.5 mmol/L (ref 3.5–5.1)
Sodium: 135 mmol/L (ref 135–145)
Total Bilirubin: 0.4 mg/dL (ref <1.2)
Total Protein: 7.3 g/dL (ref 6.5–8.1)

## 2023-09-03 LAB — CBC WITH DIFFERENTIAL (CANCER CENTER ONLY)
Abs Immature Granulocytes: 0.07 10*3/uL (ref 0.00–0.07)
Basophils Absolute: 0.1 10*3/uL (ref 0.0–0.1)
Basophils Relative: 1 %
Eosinophils Absolute: 0.3 10*3/uL (ref 0.0–0.5)
Eosinophils Relative: 3 %
HCT: 40.5 % (ref 39.0–52.0)
Hemoglobin: 13.9 g/dL (ref 13.0–17.0)
Immature Granulocytes: 1 %
Lymphocytes Relative: 19 %
Lymphs Abs: 2.2 10*3/uL (ref 0.7–4.0)
MCH: 30.5 pg (ref 26.0–34.0)
MCHC: 34.3 g/dL (ref 30.0–36.0)
MCV: 88.8 fL (ref 80.0–100.0)
Monocytes Absolute: 1.2 10*3/uL — ABNORMAL HIGH (ref 0.1–1.0)
Monocytes Relative: 10 %
Neutro Abs: 7.8 10*3/uL — ABNORMAL HIGH (ref 1.7–7.7)
Neutrophils Relative %: 66 %
Platelet Count: 808 10*3/uL — ABNORMAL HIGH (ref 150–400)
RBC: 4.56 MIL/uL (ref 4.22–5.81)
RDW: 14.6 % (ref 11.5–15.5)
WBC Count: 11.6 10*3/uL — ABNORMAL HIGH (ref 4.0–10.5)
nRBC: 0 % (ref 0.0–0.2)

## 2023-09-03 MED ORDER — SODIUM CHLORIDE 0.9% FLUSH
10.0000 mL | Freq: Once | INTRAVENOUS | Status: AC
  Administered 2023-09-03: 10 mL

## 2023-09-03 MED ORDER — MORPHINE SULFATE ER 15 MG PO TBCR: 15.0000 mg | EXTENDED_RELEASE_TABLET | Freq: Two times a day (BID) | ORAL | 0 refills | Status: DC

## 2023-09-03 MED ORDER — HEPARIN SOD (PORK) LOCK FLUSH 100 UNIT/ML IV SOLN
500.0000 [IU] | Freq: Once | INTRAVENOUS | Status: AC
  Administered 2023-09-03: 500 [IU]

## 2023-09-03 MED ORDER — OXYCODONE HCL 5 MG PO TABS: 5.0000 mg | ORAL_TABLET | ORAL | 0 refills | Status: DC | PRN

## 2023-09-03 NOTE — Telephone Encounter (Signed)
Patient called in stating that his pharmacy does not have the Morphine in stock and we need to call it into the [facility] on [address]. I have added the pharmacy and sent a message to Dr. Mosetta Putt to send refill to this pharmacy.

## 2023-09-03 NOTE — Telephone Encounter (Signed)
Done. I added a message to prescription to d/c one sent to [facility] on [address].

## 2023-09-03 NOTE — Progress Notes (Signed)
Patient here for port flush with labs, VS stable, patient c/o pain. He stated he needed refills on morphine and oxyCODONE-acetaminophen. Secure chat sent to MD and patient said he would call back later to check.

## 2023-09-08 NOTE — Assessment & Plan Note (Signed)
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  -baseline CA19.9 872 -EUS pancreatic mass biopsy confirmed adenocarcinoma. -He has seen surgeon [name] -We reviewed his case in tumor board, and recommended neoadjuvant chemotherapy.  Will repeat CT with pancreatic cancer protocol after neoadjuvant chemo. -Staging PET scan from 8/26 reviewed, no metastatic disease, there is some mild uptake in the periaortic lymph node, will monitor in the future scan.  I also discussed incidental finding of thyroid nodule, I ordered thyroid ultrasound for evaluation. -He started first cycle of chemotherapy FOLFIRINOX on May 16, 2023. He tolerated first cycle moderately well with fatigue, cold sensitivity and abdominal pain etc, discussed management of side effect. -Due to significant weight loss and fatigue after first cycle chemo, I reduced his oxaliplatin and irinotecan dose from cycle 2. -He is not tolerating chemo well, his CA19.9 has not changed much since he started chemo. I the option of changing treatment to gemcitabine and abraxane.  -Restaging CT scan from July 04, 2023 showed partial response.   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  Unfortunately multiple liver nodule was noticed during the surgery, and all 3 liver biopsy showed metastatic disease.  Tumor superior mesenteric vein margin was positive, 1 out of 28 lymph nodes were positive. He had PR to neoadjuvant chemo  -I recommend adjuvant chemotherapy when he recovers.

## 2023-09-10 ENCOUNTER — Inpatient Hospital Stay (HOSPITAL_BASED_OUTPATIENT_CLINIC_OR_DEPARTMENT_OTHER): Payer: Medicare Other | Admitting: Hematology

## 2023-09-10 ENCOUNTER — Ambulatory Visit: Payer: Medicare Other | Admitting: Hematology

## 2023-09-10 ENCOUNTER — Inpatient Hospital Stay: Payer: Medicare Other

## 2023-09-10 ENCOUNTER — Encounter: Payer: Self-pay | Admitting: Hematology

## 2023-09-10 ENCOUNTER — Other Ambulatory Visit: Payer: Medicare Other

## 2023-09-10 VITALS — BP 147/88 | HR 81 | Temp 96.9°F | Resp 16 | Wt 128.1 lb

## 2023-09-10 DIAGNOSIS — D649 Anemia, unspecified: Secondary | ICD-10-CM | POA: Diagnosis not present

## 2023-09-10 DIAGNOSIS — C252 Malignant neoplasm of tail of pancreas: Secondary | ICD-10-CM

## 2023-09-10 DIAGNOSIS — Z95828 Presence of other vascular implants and grafts: Secondary | ICD-10-CM

## 2023-09-10 LAB — CBC WITH DIFFERENTIAL (CANCER CENTER ONLY)
Abs Immature Granulocytes: 0.05 10*3/uL (ref 0.00–0.07)
Basophils Absolute: 0.1 10*3/uL (ref 0.0–0.1)
Basophils Relative: 1 %
Eosinophils Absolute: 0.3 10*3/uL (ref 0.0–0.5)
Eosinophils Relative: 2 %
HCT: 41.3 % (ref 39.0–52.0)
Hemoglobin: 14 g/dL (ref 13.0–17.0)
Immature Granulocytes: 0 %
Lymphocytes Relative: 22 %
Lymphs Abs: 2.8 10*3/uL (ref 0.7–4.0)
MCH: 29.4 pg (ref 26.0–34.0)
MCHC: 33.9 g/dL (ref 30.0–36.0)
MCV: 86.6 fL (ref 80.0–100.0)
Monocytes Absolute: 1.3 10*3/uL — ABNORMAL HIGH (ref 0.1–1.0)
Monocytes Relative: 10 %
Neutro Abs: 8.2 10*3/uL — ABNORMAL HIGH (ref 1.7–7.7)
Neutrophils Relative %: 65 %
Platelet Count: 576 10*3/uL — ABNORMAL HIGH (ref 150–400)
RBC: 4.77 MIL/uL (ref 4.22–5.81)
RDW: 14.4 % (ref 11.5–15.5)
WBC Count: 12.8 10*3/uL — ABNORMAL HIGH (ref 4.0–10.5)
nRBC: 0 % (ref 0.0–0.2)

## 2023-09-10 LAB — CMP (CANCER CENTER ONLY)
ALT: 11 U/L (ref 0–44)
AST: 12 U/L — ABNORMAL LOW (ref 15–41)
Albumin: 3.8 g/dL (ref 3.5–5.0)
Alkaline Phosphatase: 81 U/L (ref 38–126)
Anion gap: 9 (ref 5–15)
BUN: 22 mg/dL (ref 8–23)
CO2: 23 mmol/L (ref 22–32)
Calcium: 9.5 mg/dL (ref 8.9–10.3)
Chloride: 105 mmol/L (ref 98–111)
Creatinine: 0.88 mg/dL (ref 0.61–1.24)
GFR, Estimated: >60 mL/min (ref >60)
Glucose, Bld: 184 mg/dL — ABNORMAL HIGH (ref 70–99)
Potassium: 4.4 mmol/L (ref 3.5–5.1)
Sodium: 137 mmol/L (ref 135–145)
Total Bilirubin: 0.4 mg/dL (ref <1.2)
Total Protein: 6.8 g/dL (ref 6.5–8.1)

## 2023-09-10 MED ORDER — HEPARIN SOD (PORK) LOCK FLUSH 100 UNIT/ML IV SOLN
500.0000 [IU] | Freq: Once | INTRAVENOUS | Status: AC
  Administered 2023-09-10: 500 [IU]

## 2023-09-10 MED ORDER — SODIUM CHLORIDE 0.9% FLUSH
10.0000 mL | Freq: Once | INTRAVENOUS | Status: AC
  Administered 2023-09-10: 10 mL

## 2023-09-10 NOTE — Progress Notes (Signed)
[facility]   Telephone:([phone]) [phone] Fax:([phone]) [phone]   Clinic Follow up Note   Patient Care Team: Scott Retort, MD as PCP - General [name], [name], MD as PCP - Cardiology (Cardiology) Scott Mood, MD as Consulting Physician (Hematology)  Date of Service:  09/10/2023  CHIEF COMPLAINT: f/u of pancreatic cancer  CURRENT THERAPY:  Pending chemotherapy gemcitabine and Xeloda  Oncology History   Pancreatic cancer (HCC) -cT3N0M0, stage IIA --Patient presented with abdominal pain and weight loss, CT scan revealed a 4.4 x 3.5 cm ill-defined mass in the pancreatic tail, which 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  -baseline CA19.9 872 -EUS pancreatic mass biopsy confirmed adenocarcinoma. -He has seen surgeon Dr. Donell [name] -We reviewed his case in tumor board, and recommended neoadjuvant chemotherapy.  Will repeat CT with pancreatic cancer protocol after neoadjuvant chemo. -Staging PET scan from 8/26 reviewed, no metastatic disease, there is some mild uptake in the periaortic lymph node, will monitor in the future scan.  I also discussed incidental finding of thyroid nodule, I ordered thyroid ultrasound for evaluation. -He started first cycle of chemotherapy FOLFIRINOX on May 16, 2023. He tolerated first cycle moderately well with fatigue, cold sensitivity and abdominal pain etc, discussed management of side effect. -Due to significant weight loss and fatigue after first cycle chemo, I reduced his oxaliplatin and irinotecan dose from cycle 2. -He is not tolerating chemo well, his CA19.9 has not changed much since he started chemo. I the option of changing treatment to gemcitabine and abraxane.  -Restaging CT scan from July 04, 2023 showed partial response.   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  Unfortunately multiple liver nodule was noticed during the surgery, and all 3 liver biopsy  showed metastatic disease.  Tumor superior mesenteric vein margin was positive, 1 out of 28 lymph nodes were positive. He had PR to neoadjuvant chemo  -I recommend adjuvant chemotherapy when he recovers.    Assessment and Plan    Pancreatic Cancer with Metastasis to Liver 72 year old with stage IV pancreatic cancer post-surgery, experiencing significant weakness and fatigue. Cancer has metastasized to the liver. Currently not strong enough for chemotherapy but may consider combination intravenous and oral chemotherapy if condition improves. Prognosis is poor with a 20-25% chance of returning to normal life. Discussed that the cancer is likely not curable but treatable, with a small possibility of remission if able to tolerate chemotherapy. - Follow up in three weeks to reassess condition and consider chemotherapy if stronger - Encourage nutritional intake and physical activity to improve strength - Continue with home physical therapy - Monitor for signs of dehydration and ensure adequate fluid intake - Discuss appetite stimulants and he has recently started Megace  Weight Loss and Poor Appetite Significant weight loss and poor appetite, with food tasting metallic. Currently taking Megace and consuming 5-6 Ensure supplements daily. Struggles to eat regular meals. - Continue Megace for appetite stimulation - Encourage consumption of 7-8 Ensure supplements daily - Encourage small, frequent meals and snacks  Post-Surgical Pain and Weakness Persistent pain and weakness post-surgery, with discomfort in the tailbone area due to prolonged sitting. Small, sensitive area at the top of the tailbone managed with Desitin and hydrocortisone. - Continue Desitin and hydrocortisone for tailbone sensitivity - Monitor for signs of skin breakdown and use topical antibiotics if necessary - Encourage use of cushions to alleviate pressure on the tailbone - Continue pain management as  prescribed  Thrombocytosis -  Developed after surgery, likely reactive -It is improving, platelet count 500 76K today  Plan -He is slowly improving, PS still not adequate for chemotherapy - Follow up in three weeks to see if we can start adjuvant chemotherapy gemcitabine and Xeloda - Follow up with surgeon on Friday for potential removal of remaining drainage tube       SUMMARY OF ONCOLOGIC HISTORY: Oncology History Overview Note   Cancer Staging  Pancreatic cancer [facility]) Staging form: Exocrine Pancreas, AJCC 8th Edition - Clinical stage from 05/01/2023: Stage III (cT3, cN2, cM0) - Signed by Scott Mood, MD on 05/03/2023     Pancreatic cancer (HCC)  05/01/2023 Cancer Staging   Staging form: Exocrine Pancreas, AJCC 8th Edition - Clinical stage from 05/01/2023: Stage III (cT3, cN2, cM0) - Signed by Scott Mood, MD on 05/03/2023   05/02/2023 Initial Diagnosis   Pancreatic cancer (HCC)   05/16/2023 - 06/28/2023 Chemotherapy   Patient is on Treatment Plan : PANCREAS Modified FOLFIRINOX q14d x 4 cycles     07/09/2023 - 07/09/2023 Chemotherapy   Patient is on Treatment Plan : PANCREATIC Abraxane D1,8,15 + Gemcitabine D1,8,15 q28d     08/13/2023 Surgery   Open distal sub total pancreatectomy, partial gastrectomy, and partial small bowel resection. Dr. [name] [facility]      Discussed the use of AI scribe software for clinical note transcription with the patient, who gave verbal consent to proceed.  History of Present Illness   A 72 year old patient with a history of pancreatic cancer presents for a follow-up appointment. He reports feeling the best he has since his surgery, although he still feels weak and "out of it." The patient has been mostly sedentary, with the most activity being walking to the bathroom and back to the sofa. He expresses a desire to start exercising by walking up and down his hallway with a walker once he feels stronger.  The patient has been experiencing  dizziness upon standing, which has been attributed to a drop in blood pressure. He reports that his blood pressure drops about 30 points from lying down to standing up. As a result, his doctor's office instructed him to stop taking valsartan and amlodipine at night. The patient has been monitoring his blood pressure at home and reports that it has stabilized.  The patient has been struggling with eating due to a change in taste perception. He describes a metallic taste and reports that food does not taste good. He has been eating less than usual and has been relying on liquid supplements like Ensure Boost. He expresses frustration with his inability to eat despite feeling hungry.  The patient also reports pain and sensitivity in his tailbone area, which he attributes to his skeletal weakness. He has been using a cushion to sit on for comfort. He also has a draining tube from his recent surgery, which he hopes will be removed soon.         All other systems were reviewed with the patient and are negative.  MEDICAL HISTORY:  Past Medical History:  Diagnosis Date   Arthritis    Bilateral Knees/Fingers   CAD (coronary artery disease)    Cancer (HCC) 2024   Pancreatic Cancer   Cerebrovascular disease    Diabetes mellitus without complication (HCC)    Dysrhythmia    Hx. A.Fib. Been NSR since ablation   History of kidney stones    HLD (hyperlipidemia)    HTN (hypertension)    IBS (irritable bowel syndrome)  Patient states did not have   Lumbar stenosis    MI 1996   Persistent atrial fibrillation (HCC)    PVD (peripheral vascular disease) (HCC)    Stroke (cerebrum) (HCC)    Tobacco abuse     SURGICAL HISTORY: Past Surgical History:  Procedure Laterality Date   ABDOMINAL AORTOGRAM W/LOWER EXTREMITY N/A 01/25/2017   Procedure: Abdominal Aortogram w/Lower Extremity;  Surgeon: Sherren Kerns, MD;  Location: [facility] INVASIVE CV LAB;  Service: Cardiovascular;  Laterality: N/A;   ANTERIOR  CRUCIATE LIGAMENT REPAIR     BOWEL RESECTION N/A 08/13/2023   Procedure: PARTIAL SMALL BOWEL RESECTION;  Surgeon: Almond Lint, MD;  Location: [facility] OR;  Service: General;  Laterality: N/A;   CARDIAC CATHETERIZATION     CONE   CARDIAC CATHETERIZATION N/A 07/27/2016   Procedure: Left Heart Cath and Coronary Angiography;  Surgeon: Tonny Bollman, MD;  Location: [facility] INVASIVE CV LAB;  Service: Cardiovascular;  Laterality: N/A;   CARDIOVERSION N/A 06/29/2016   Procedure: CARDIOVERSION;  Surgeon: Pricilla Riffle, MD;  Location: [facility] ENDOSCOPY;  Service: Cardiovascular;  Laterality: N/A;   CARDIOVERSION N/A 03/05/2018   Procedure: CARDIOVERSION;  Surgeon: Laurey Morale, MD;  Location: [facility] ENDOSCOPY;  Service: Cardiovascular;  Laterality: N/A;   CHOLECYSTECTOMY     ENDARTERECTOMY Right 12/06/2017   Procedure: ENDARTERECTOMY CAROTID RIGHT;  Surgeon: Nada Libman, MD;  Location: [facility] OR;  Service: Vascular;  Laterality: Right;   ESOPHAGOGASTRODUODENOSCOPY (EGD) WITH PROPOFOL N/A 05/01/2023   Procedure: ESOPHAGOGASTRODUODENOSCOPY (EGD) WITH PROPOFOL;  Surgeon: Willis Modena, MD;  Location: [facility] ENDOSCOPY;  Service: Gastroenterology;  Laterality: N/A;   FINE NEEDLE ASPIRATION N/A 05/01/2023   Procedure: FINE NEEDLE ASPIRATION (FNA) LINEAR;  Surgeon: Willis Modena, MD;  Location: [facility] ENDOSCOPY;  Service: Gastroenterology;  Laterality: N/A;   KNEE SURGERY Bilateral 1982, 1988, 1997, 2000   x 4 times.   LAPAROSCOPY N/A 08/13/2023   Procedure: LAPAROSCOPY DIAGNOSTIC;  Surgeon: Almond Lint, MD;  Location: [facility] OR;  Service: General;  Laterality: N/A;   LOWER EXTREMITY ANGIOGRAPHY N/A 07/03/2019   Procedure: LOWER EXTREMITY ANGIOGRAPHY;  Surgeon: Sherren Kerns, MD;  Location: [facility] INVASIVE CV LAB;  Service: Cardiovascular;  Laterality: N/A;   LUMBAR LAMINECTOMY/DECOMPRESSION MICRODISCECTOMY Bilateral 09/09/2018   Procedure: Laminectomy and Foraminotomy bilateral Lumbar three-Lumbar four - Lumbar four-Lumbar  five;  Surgeon: Julio Sicks, MD;  Location: [facility] OR;  Service: Neurosurgery;  Laterality: Bilateral;   PARTIAL GASTRECTOMY N/A 08/13/2023   Procedure: PARTIAL GASTRECTOMY;  Surgeon: Almond Lint, MD;  Location: [facility] OR;  Service: General;  Laterality: N/A;   PATCH ANGIOPLASTY Right 12/06/2017   Procedure: PATCH ANGIOPLASTY RIGHT CAROTID ARTERY;  Surgeon: Nada Libman, MD;  Location: [facility] OR;  Service: Vascular;  Laterality: Right;   PERIPHERAL VASCULAR INTERVENTION Right 01/25/2017   Procedure: Peripheral Vascular Intervention;  Surgeon: Sherren Kerns, MD;  Location: [facility] INVASIVE CV LAB;  Service: Cardiovascular;  Laterality: Right;   PERIPHERAL VASCULAR INTERVENTION Right 07/03/2019   Procedure: PERIPHERAL VASCULAR INTERVENTION;  Surgeon: Sherren Kerns, MD;  Location: [facility] INVASIVE CV LAB;  Service: Cardiovascular;  Laterality: Right;   PORTACATH PLACEMENT Left 05/14/2023   Procedure: INSERTION PORT-A-CATH;  Surgeon: Almond Lint, MD;  Location: [facility] OR;  Service: General;  Laterality: Left;   TONSILLECTOMY     UPPER ESOPHAGEAL ENDOSCOPIC ULTRASOUND (EUS) Bilateral 05/01/2023   Procedure: UPPER ESOPHAGEAL ENDOSCOPIC ULTRASOUND (EUS);  Surgeon: Willis Modena, MD;  Location: [facility] ENDOSCOPY;  Service: Gastroenterology;  Laterality: Bilateral;   XI ROBOTIC ASSISTED LAPAROSCOPIC DISTAL  PANCREATECTOMY N/A 08/13/2023   Procedure: HAND ASSISTED LAPAROSCOPIC SPLENECTOMY;  Surgeon: Almond Lint, MD;  Location: [facility] OR;  Service: General;  Laterality: N/A;    I have reviewed the social history and family history with the patient and they are unchanged from previous note.  ALLERGIES:  is allergic to augmentin [amoxicillin-pot clavulanate].  MEDICATIONS:  Current Outpatient Medications  Medication Sig Dispense Refill   acetaminophen (TYLENOL) 500 MG tablet Take 2 tablets (1,000 mg total) by mouth every 6 (six) hours. 30 tablet 0   apixaban (ELIQUIS) 5 MG TABS tablet Take 1 tablet (5 mg total) by mouth  2 (two) times daily. 60 tablet 5   aspirin EC 81 MG tablet Take 81 mg by mouth daily.     diazepam (VALIUM) 2 MG tablet Take 2 mg by mouth 2 (two) times daily as needed for anxiety.     diclofenac Sodium (VOLTAREN) 1 % GEL Apply 4 g topically 4 (four) times daily as needed (Up to 4x daily max).     esomeprazole (NEXIUM) 40 MG capsule Take 40 mg by mouth daily as needed (heartburn/hiccups).     fenofibrate (TRICOR) 48 MG tablet Take 48 mg by mouth daily.     gabapentin (NEURONTIN) 300 MG capsule Take 900 mg by mouth 2 (two) times daily.     Insulin Glargine (BASAGLAR KWIKPEN) 100 UNIT/ML Inject 8 Units into the skin daily. 15 mL 1   JARDIANCE 25 MG TABS tablet Take 25 mg by mouth daily.     metFORMIN (GLUCOPHAGE-XR) 500 MG 24 hr tablet Take 1,000 mg by mouth daily after supper.     methocarbamol (ROBAXIN) 750 MG tablet Take 1 tablet (750 mg total) by mouth every 8 (eight) hours as needed for muscle spasms. 30 tablet 2   morphine (MS CONTIN) 15 MG 12 hr tablet Take 1 tablet (15 mg total) by mouth every 12 (twelve) hours. 60 tablet 0   ondansetron (ZOFRAN) 4 MG tablet Take 4 mg by mouth every 8 (eight) hours as needed for nausea or vomiting.     oxyCODONE (OXY IR/ROXICODONE) 5 MG immediate release tablet Take 1-2 tablets (5-10 mg total) by mouth every 4 (four) hours as needed for moderate pain (pain score 4-6) or breakthrough pain. 90 tablet 0   oxyCODONE-acetaminophen (PERCOCET/ROXICET) 5-325 MG tablet Take 1 tablet by mouth every 6 (six) hours as needed for severe pain (pain score 7-10). 40 tablet 0   polyethylene glycol (MIRALAX / GLYCOLAX) 17 g packet Take 17 g by mouth daily as needed for moderate constipation.     prochlorperazine (COMPAZINE) 10 MG tablet Take 1 tablet (10 mg total) by mouth every 6 (six) hours as needed for nausea or vomiting (Use for nausea and / or vomiting unresolved with ondansetron (Zofran).). 30 tablet 0   rosuvastatin (CRESTOR) 40 MG tablet Take 40 mg by mouth daily.      sildenafil (VIAGRA) 100 MG tablet Take 100 mg by mouth as needed for erectile dysfunction.     spironolactone (ALDACTONE) 25 MG tablet Take 25 mg by mouth daily.     No current facility-administered medications for this visit.    PHYSICAL EXAMINATION: ECOG PERFORMANCE STATUS: 3 - Symptomatic, >50% confined to bed  Vitals:   09/10/23 1037  BP: (!) 147/88  Pulse: 81  Resp: 16  Temp: (!) 96.9 F (36.1 C)  SpO2: 100%   Wt Readings from Last 3 Encounters:  09/10/23 128 lb 1.6 oz (58.1 kg)  08/27/23 133 lb 14.4  oz (60.7 kg)  08/13/23 144 lb 1.6 oz (65.4 kg)     GENERAL:alert, no distress and comfortable SKIN: skin color, texture, turgor are normal, no rashes or significant lesions EYES: normal, Conjunctiva are pink and non-injected, sclera clear Musculoskeletal:no cyanosis of digits and no clubbing  NEURO: alert & oriented x 3 with fluent speech, no focal motor/sensory deficits   LABORATORY DATA:  I have reviewed the data as listed    Latest Ref Rng & Units 09/10/2023   10:20 AM 09/03/2023    1:21 PM 08/27/2023   12:36 PM  CBC  WBC 4.0 - 10.5 K/uL 12.8  11.6  10.8   Hemoglobin 13.0 - 17.0 g/dL 47.8  29.5  62.1   Hematocrit 39.0 - 52.0 % 41.3  40.5  42.4   Platelets 150 - 400 K/uL 576  808  1,000         Latest Ref Rng & Units 09/10/2023   10:20 AM 09/03/2023    1:21 PM 08/27/2023   12:36 PM  CMP  Glucose 70 - 99 mg/dL 308  657  846   BUN 8 - 23 mg/dL 22  32  19   Creatinine 0.61 - 1.24 mg/dL 9.62  9.52  8.41   Sodium 135 - 145 mmol/L 137  135  137   Potassium 3.5 - 5.1 mmol/L 4.4  4.5  4.4   Chloride 98 - 111 mmol/L 105  100  100   CO2 22 - 32 mmol/L 23  24  25    Calcium 8.9 - 10.3 mg/dL 9.5  9.6  9.4   Total Protein 6.5 - 8.1 g/dL 6.8  7.3  6.9   Total Bilirubin <1.2 mg/dL 0.4  0.4  0.4   Alkaline Phos 38 - 126 U/L 81  105  106   AST 15 - 41 U/L 12  13  20    ALT 0 - 44 U/L 11  17  22        RADIOGRAPHIC STUDIES: I have personally reviewed the radiological  images as listed and agreed with the findings in the report. No results found.    Orders Placed This Encounter  Procedures   Ferritin    Standing Status:   Future    Expected Date:   09/24/2023    Expiration Date:   09/09/2024   All questions were answered. The patient knows to call the clinic with any problems, questions or concerns. No barriers to learning was detected. The total time spent in the appointment was 25 minutes.     Scott Mood, MD 09/10/2023

## 2023-09-13 ENCOUNTER — Other Ambulatory Visit: Payer: Self-pay

## 2023-09-26 ENCOUNTER — Telehealth: Payer: Self-pay | Admitting: Hematology

## 2023-09-30 ENCOUNTER — Other Ambulatory Visit: Payer: Medicare Other

## 2023-09-30 ENCOUNTER — Ambulatory Visit: Payer: Medicare Other | Admitting: Hematology

## 2023-09-30 NOTE — Assessment & Plan Note (Addendum)
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  -baseline CA19.9 872 -EUS pancreatic mass biopsy confirmed adenocarcinoma. -He has seen surgeon Dr. Aron -We reviewed his case in tumor board, and recommended neoadjuvant chemotherapy.  Will repeat CT with pancreatic cancer protocol after neoadjuvant chemo. -Staging PET scan from 8/26 reviewed, no metastatic disease, there is some mild uptake in the periaortic lymph node, will monitor in the future scan.  I also discussed incidental finding of thyroid  nodule, I ordered thyroid  ultrasound for evaluation. -He started first cycle of chemotherapy FOLFIRINOX on May 16, 2023. He tolerated first cycle moderately well with fatigue, cold sensitivity and abdominal pain etc, discussed management of side effect. -Due to significant weight loss and fatigue after first cycle chemo, I reduced his oxaliplatin  and irinotecan  dose from cycle 2. -He is not tolerating chemo well, his CA19.9 has not changed much since he started chemo. I the option of changing treatment to gemcitabine  and abraxane.  -Restaging CT scan from July 04, 2023 showed partial response.   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  Unfortunately multiple liver nodule was noticed during the surgery, and all 3 liver biopsy showed metastatic disease.  Tumor superior mesenteric vein margin was positive, 1 out of 28 lymph nodes were positive. He had PR to neoadjuvant chemo  -I recommend restarting chemo with gemcitabine  and Xeloda, but he has been recovering very slowly

## 2023-10-01 ENCOUNTER — Encounter: Payer: Self-pay | Admitting: Hematology

## 2023-10-01 ENCOUNTER — Inpatient Hospital Stay: Payer: Medicare Other | Attending: Hematology

## 2023-10-01 ENCOUNTER — Inpatient Hospital Stay: Payer: Medicare Other | Attending: Hematology | Admitting: Hematology

## 2023-10-01 DIAGNOSIS — K7689 Other specified diseases of liver: Secondary | ICD-10-CM | POA: Insufficient documentation

## 2023-10-01 DIAGNOSIS — R634 Abnormal weight loss: Secondary | ICD-10-CM | POA: Insufficient documentation

## 2023-10-01 DIAGNOSIS — C252 Malignant neoplasm of tail of pancreas: Secondary | ICD-10-CM | POA: Diagnosis not present

## 2023-10-01 DIAGNOSIS — M549 Dorsalgia, unspecified: Secondary | ICD-10-CM | POA: Insufficient documentation

## 2023-10-01 DIAGNOSIS — R109 Unspecified abdominal pain: Secondary | ICD-10-CM | POA: Insufficient documentation

## 2023-10-01 DIAGNOSIS — Z9081 Acquired absence of spleen: Secondary | ICD-10-CM | POA: Insufficient documentation

## 2023-10-01 DIAGNOSIS — R197 Diarrhea, unspecified: Secondary | ICD-10-CM | POA: Insufficient documentation

## 2023-10-01 DIAGNOSIS — R5383 Other fatigue: Secondary | ICD-10-CM | POA: Insufficient documentation

## 2023-10-01 MED ORDER — OXYCODONE HCL 5 MG PO TABS: 5.0000 mg | ORAL_TABLET | ORAL | 0 refills | Status: DC | PRN

## 2023-10-01 MED ORDER — MORPHINE SULFATE ER 15 MG PO TBCR: 15.0000 mg | EXTENDED_RELEASE_TABLET | Freq: Two times a day (BID) | ORAL | 0 refills | Status: DC

## 2023-10-01 NOTE — Progress Notes (Signed)
 [facility]   Telephone:([phone]) [phone] Fax:(336) [phone]   Clinic Follow up Note   Patient Care Team: Arloa Elsie SAUNDERS, MD as PCP - General [name], Aleene PARAS, MD as PCP - Cardiology (Cardiology) Lanny Callander, MD as Consulting Physician (Hematology) 10/01/2023  I connected with Scott [name] on 10/01/23 at 10:20 AM EST by telephone and verified that I am speaking with the correct person using two identifiers.   I discussed the limitations, risks, security and privacy concerns of performing an evaluation and management service by telephone and the availability of in person appointments. I also discussed with the patient that there may be a patient responsible charge related to this service. The patient expressed understanding and agreed to proceed.   Patient's location: Home Provider's location: Home   CHIEF COMPLAINT: Follow-up of pancreatic cancer   CURRENT THERAPY: Pending adjuvant chemotherapy  Oncology history Pancreatic cancer (HCC) -cT3N0M0, stage IIA --Patient presented with abdominal pain and weight loss, CT scan revealed a 4.4 x 3.5 cm ill-defined mass in the pancreatic tail, which 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  -baseline CA19.9 872 -EUS pancreatic mass biopsy confirmed adenocarcinoma. -He has seen surgeon Dr. Aron -We reviewed his case in tumor board, and recommended neoadjuvant chemotherapy.  Will repeat CT with pancreatic cancer protocol after neoadjuvant chemo. -Staging PET scan from 8/26 reviewed, no metastatic disease, there is some mild uptake in the periaortic lymph node, will monitor in the future scan.  I also discussed incidental finding of thyroid  nodule, I ordered thyroid  ultrasound for evaluation. -He started first cycle of chemotherapy FOLFIRINOX on May 16, 2023. He tolerated first cycle moderately well with fatigue, cold sensitivity and abdominal pain etc, discussed management of side effect. -Due  to significant weight loss and fatigue after first cycle chemo, I reduced his oxaliplatin  and irinotecan  dose from cycle 2. -He is not tolerating chemo well, his CA19.9 has not changed much since he started chemo. I the option of changing treatment to gemcitabine  and abraxane.  -Restaging CT scan from July 04, 2023 showed partial response.   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  Unfortunately multiple liver nodule was noticed during the surgery, and all 3 liver biopsy showed metastatic disease.  Tumor superior mesenteric vein margin was positive, 1 out of 28 lymph nodes were positive. He had PR to neoadjuvant chemo  -I recommend restarting chemo with gemcitabine  and Xeloda, but he has been recovering very slowly   Assessment and Plan    Pancreatic Cancer (Stage IV) 73 year old with Stage IV pancreatic cancer, post-surgery on August 13, 2023. Reports significant pain managed with morphine  and oxycodone . Concerned about starting chemotherapy due to weakness and difficulty gaining weight. Emphasized importance of starting chemotherapy within three months post-surgery to maximize efficacy. Reports changes in taste and appetite, potentially related to surgery or residual cancer. Plan to start chemotherapy this month if possible. Discussed repeating CT scan to ensure no new metastatic disease before starting chemotherapy. - Order CT scan in two weeks to assess for metastatic disease - Continue current pain management with morphine  and oxycodone  - Refer to palliative care nurse practitioner for pain management - Monitor for signs of infection (fever, chills) and report if he occurs - Encourage nutritional intake and exercise to improve strength - Schedule follow-up appointment after the CT scan to discuss chemotherapy timing  Chronic back pain and cancer/surgery related abdominal pain  -Patient was on chronic narcotics with Percocet in  the past for low  back pain, managed by his primary care physician  -significant pain following removal of a drainage tube, managed with morphine  and oxycodone . - Refill morphine  and oxycodone  prescriptions - Refer to palliative care nurse practitioner for ongoing pain management  Nutritional Deficiency Difficulty gaining weight and reports food tastes bad, affecting intake. Likely multifactorial, related to recent surgery and underlying cancer. Reports some foods are more palatable, such as pancakes and pork tenderloin. - Encourage trying different foods to find what is palatable - Monitor weight and nutritional intake - Discuss potential nutritional support options at the next visit  plan -Follow-up in 2 to 3 weeks after lab and restaging CT chest, abdomen pelvis with contrast -Refer to palliative care clinic NP [facility] for pain management     SUMMARY OF ONCOLOGIC HISTORY: Oncology History Overview Note   Cancer Staging  Pancreatic cancer [facility]) Staging form: Exocrine Pancreas, AJCC 8th Edition - Clinical stage from 05/01/2023: Stage III (cT3, cN2, cM0) - Signed by Lanny Callander, MD on 05/03/2023     Pancreatic cancer (HCC)  05/01/2023 Cancer Staging   Staging form: Exocrine Pancreas, AJCC 8th Edition - Clinical stage from 05/01/2023: Stage III (cT3, cN2, cM0) - Signed by Lanny Callander, MD on 05/03/2023   05/02/2023 Initial Diagnosis   Pancreatic cancer (HCC)   05/16/2023 - 06/28/2023 Chemotherapy   Patient is on Treatment Plan : PANCREAS Modified FOLFIRINOX q14d x 4 cycles     07/09/2023 - 07/09/2023 Chemotherapy   Patient is on Treatment Plan : PANCREATIC Abraxane D1,8,15 + Gemcitabine  D1,8,15 q28d     08/13/2023 Surgery   Open distal sub total pancreatectomy, partial gastrectomy, and partial small bowel resection. Dr. Clabe [facility]     Discussed the use of AI scribe software for clinical note transcription with the patient, who gave verbal consent to proceed.  History of Present Illness    The patient, a 73 year old with a history of pancreatic cancer, presents for follow-up after recent surgery. He reports a significant setback in his recovery due to complications with a drainage tube. The tube, which had been causing severe pain and limiting mobility, has since been removed, leading to a significant reduction in pain. However, the patient still reports some residual pain and has been taking morphine  for pain management.  The patient also reports difficulty maintaining weight and changes in taste, which have affected his ability to eat. Despite feeling hungry, the patient finds that food often tastes bad after a few bites, leading to a struggle to consume enough food. The patient expresses a desire to gain weight and strength before starting chemotherapy.  The patient also mentions a recent consultation with another doctor, who informed him that any remaining cancer in his body is microscopic and will not cause pain. The patient expresses surprise at the severity of his reaction to the surgery and the subsequent recovery process.         REVIEW OF SYSTEMS:   Constitutional: Denies fevers, chills or abnormal weight loss Eyes: Denies blurriness of vision Ears, nose, mouth, throat, and face: Denies mucositis or sore throat Respiratory: Denies cough, dyspnea or wheezes Cardiovascular: Denies palpitation, chest discomfort or lower extremity swelling Gastrointestinal:  Denies nausea, heartburn or change in bowel habits Skin: Denies abnormal skin rashes Lymphatics: Denies new lymphadenopathy or easy bruising Neurological:Denies numbness, tingling or new weaknesses Behavioral/Psych: Mood is stable, no new changes  All other systems were reviewed with the patient and are negative.  MEDICAL HISTORY:  Past  Medical History:  Diagnosis Date   Arthritis    Bilateral Knees/Fingers   CAD (coronary artery disease)    Cancer (HCC) 2024   Pancreatic Cancer   Cerebrovascular disease     Diabetes mellitus without complication (HCC)    Dysrhythmia    Hx. A.Fib. Been NSR since ablation   History of kidney stones    HLD (hyperlipidemia)    HTN (hypertension)    IBS (irritable bowel syndrome)    Patient states did not have   Lumbar stenosis    MI 1996   Persistent atrial fibrillation (HCC)    PVD (peripheral vascular disease) (HCC)    Stroke (cerebrum) (HCC)    Tobacco abuse     SURGICAL HISTORY: Past Surgical History:  Procedure Laterality Date   ABDOMINAL AORTOGRAM W/LOWER EXTREMITY N/A 01/25/2017   Procedure: Abdominal Aortogram w/Lower Extremity;  Surgeon: Harvey Carlin BRAVO, MD;  Location: [facility] INVASIVE CV LAB;  Service: Cardiovascular;  Laterality: N/A;   ANTERIOR CRUCIATE LIGAMENT REPAIR     BOWEL RESECTION N/A 08/13/2023   Procedure: PARTIAL SMALL BOWEL RESECTION;  Surgeon: Aron Shoulders, MD;  Location: [facility] OR;  Service: General;  Laterality: N/A;   CARDIAC CATHETERIZATION     CONE   CARDIAC CATHETERIZATION N/A 07/27/2016   Procedure: Left Heart Cath and Coronary Angiography;  Surgeon: Ozell Fell, MD;  Location: [facility] INVASIVE CV LAB;  Service: Cardiovascular;  Laterality: N/A;   CARDIOVERSION N/A 06/29/2016   Procedure: CARDIOVERSION;  Surgeon: Vina LULLA Gull, MD;  Location: [facility] ENDOSCOPY;  Service: Cardiovascular;  Laterality: N/A;   CARDIOVERSION N/A 03/05/2018   Procedure: CARDIOVERSION;  Surgeon: Rolan Ezra RAMAN, MD;  Location: [facility] ENDOSCOPY;  Service: Cardiovascular;  Laterality: N/A;   CHOLECYSTECTOMY     ENDARTERECTOMY Right 12/06/2017   Procedure: ENDARTERECTOMY CAROTID RIGHT;  Surgeon: Serene Gaile ORN, MD;  Location: [facility] OR;  Service: Vascular;  Laterality: Right;   ESOPHAGOGASTRODUODENOSCOPY (EGD) WITH PROPOFOL  N/A 05/01/2023   Procedure: ESOPHAGOGASTRODUODENOSCOPY (EGD) WITH PROPOFOL ;  Surgeon: Burnette Fallow, MD;  Location: [facility] ENDOSCOPY;  Service: Gastroenterology;  Laterality: N/A;   FINE NEEDLE ASPIRATION N/A 05/01/2023   Procedure: FINE NEEDLE  ASPIRATION (FNA) LINEAR;  Surgeon: Burnette Fallow, MD;  Location: [facility] ENDOSCOPY;  Service: Gastroenterology;  Laterality: N/A;   KNEE SURGERY Bilateral 1982, 1988, 1997, 2000   x 4 times.   LAPAROSCOPY N/A 08/13/2023   Procedure: LAPAROSCOPY DIAGNOSTIC;  Surgeon: Aron Shoulders, MD;  Location: [facility] OR;  Service: General;  Laterality: N/A;   LOWER EXTREMITY ANGIOGRAPHY N/A 07/03/2019   Procedure: LOWER EXTREMITY ANGIOGRAPHY;  Surgeon: Harvey Carlin BRAVO, MD;  Location: [facility] INVASIVE CV LAB;  Service: Cardiovascular;  Laterality: N/A;   LUMBAR LAMINECTOMY/DECOMPRESSION MICRODISCECTOMY Bilateral 09/09/2018   Procedure: Laminectomy and Foraminotomy bilateral Lumbar three-Lumbar four - Lumbar four-Lumbar five;  Surgeon: Louis Shove, MD;  Location: [facility] OR;  Service: Neurosurgery;  Laterality: Bilateral;   PARTIAL GASTRECTOMY N/A 08/13/2023   Procedure: PARTIAL GASTRECTOMY;  Surgeon: Aron Shoulders, MD;  Location: [facility] OR;  Service: General;  Laterality: N/A;   PATCH ANGIOPLASTY Right 12/06/2017   Procedure: PATCH ANGIOPLASTY RIGHT CAROTID ARTERY;  Surgeon: Serene Gaile ORN, MD;  Location: [facility] OR;  Service: Vascular;  Laterality: Right;   PERIPHERAL VASCULAR INTERVENTION Right 01/25/2017   Procedure: Peripheral Vascular Intervention;  Surgeon: Harvey Carlin BRAVO, MD;  Location: [facility] INVASIVE CV LAB;  Service: Cardiovascular;  Laterality: Right;   PERIPHERAL VASCULAR INTERVENTION Right 07/03/2019   Procedure: PERIPHERAL VASCULAR INTERVENTION;  Surgeon: Harvey Carlin BRAVO, MD;  Location:  [facility] INVASIVE CV LAB;  Service: Cardiovascular;  Laterality: Right;   PORTACATH PLACEMENT Left 05/14/2023   Procedure: INSERTION PORT-A-CATH;  Surgeon: Aron Shoulders, MD;  Location: [facility] OR;  Service: General;  Laterality: Left;   TONSILLECTOMY     UPPER ESOPHAGEAL ENDOSCOPIC ULTRASOUND (EUS) Bilateral 05/01/2023   Procedure: UPPER ESOPHAGEAL ENDOSCOPIC ULTRASOUND (EUS);  Surgeon: Burnette Fallow, MD;  Location: [facility];  Service:  Gastroenterology;  Laterality: Bilateral;   XI ROBOTIC ASSISTED LAPAROSCOPIC DISTAL PANCREATECTOMY N/A 08/13/2023   Procedure: HAND ASSISTED LAPAROSCOPIC SPLENECTOMY;  Surgeon: Aron Shoulders, MD;  Location: [facility] OR;  Service: General;  Laterality: N/A;    I have reviewed the social history and family history with the patient and they are unchanged from previous note.  ALLERGIES:  is allergic to augmentin [amoxicillin-pot clavulanate].  MEDICATIONS:  Current Outpatient Medications  Medication Sig Dispense Refill   acetaminophen  (TYLENOL ) 500 MG tablet Take 2 tablets (1,000 mg total) by mouth every 6 (six) hours. 30 tablet 0   apixaban  (ELIQUIS ) 5 MG TABS tablet Take 1 tablet (5 mg total) by mouth 2 (two) times daily. 60 tablet 5   aspirin  EC 81 MG tablet Take 81 mg by mouth daily.     diazepam  (VALIUM ) 2 MG tablet Take 2 mg by mouth 2 (two) times daily as needed for anxiety.     diclofenac  Sodium (VOLTAREN ) 1 % GEL Apply 4 g topically 4 (four) times daily as needed (Up to 4x daily max).     esomeprazole (NEXIUM) 40 MG capsule Take 40 mg by mouth daily as needed (heartburn/hiccups).     fenofibrate  (TRICOR ) 48 MG tablet Take 48 mg by mouth daily.     gabapentin  (NEURONTIN ) 300 MG capsule Take 900 mg by mouth 2 (two) times daily.     Insulin  Glargine (BASAGLAR  KWIKPEN) 100 UNIT/ML Inject 8 Units into the skin daily. 15 mL 1   JARDIANCE 25 MG TABS tablet Take 25 mg by mouth daily.     metFORMIN  (GLUCOPHAGE -XR) 500 MG 24 hr tablet Take 1,000 mg by mouth daily after supper.     methocarbamol  (ROBAXIN ) 750 MG tablet Take 1 tablet (750 mg total) by mouth every 8 (eight) hours as needed for muscle spasms. 30 tablet 2   morphine  (MS CONTIN ) 15 MG 12 hr tablet Take 1 tablet (15 mg total) by mouth every 12 (twelve) hours. 60 tablet 0   ondansetron  (ZOFRAN ) 4 MG tablet Take 4 mg by mouth every 8 (eight) hours as needed for nausea or vomiting.     oxyCODONE  (OXY IR/ROXICODONE ) 5 MG immediate release tablet  Take 1-2 tablets (5-10 mg total) by mouth every 4 (four) hours as needed for moderate pain (pain score 4-6) or breakthrough pain. 90 tablet 0   oxyCODONE -acetaminophen  (PERCOCET/ROXICET) 5-325 MG tablet Take 1 tablet by mouth every 6 (six) hours as needed for severe pain (pain score 7-10). 40 tablet 0   polyethylene glycol (MIRALAX  / GLYCOLAX ) 17 g packet Take 17 g by mouth daily as needed for moderate constipation.     prochlorperazine  (COMPAZINE ) 10 MG tablet Take 1 tablet (10 mg total) by mouth every 6 (six) hours as needed for nausea or vomiting (Use for nausea and / or vomiting unresolved with ondansetron  (Zofran ).). 30 tablet 0   rosuvastatin  (CRESTOR ) 40 MG tablet Take 40 mg by mouth daily.     sildenafil (VIAGRA) 100 MG tablet Take 100 mg by mouth as needed for erectile dysfunction.     spironolactone  (ALDACTONE ) 25 MG  tablet Take 25 mg by mouth daily.     No current facility-administered medications for this visit.    PHYSICAL EXAMINATION: Not performed   LABORATORY DATA:  I have reviewed the data as listed    Latest Ref Rng & Units 09/10/2023   10:20 AM 09/03/2023    1:21 PM 08/27/2023   12:36 PM  CBC  WBC 4.0 - 10.5 K/uL 12.8  11.6  10.8   Hemoglobin 13.0 - 17.0 g/dL 85.9  86.0  85.8   Hematocrit 39.0 - 52.0 % 41.3  40.5  42.4   Platelets 150 - 400 K/uL 576  808  1,000         Latest Ref Rng & Units 09/10/2023   10:20 AM 09/03/2023    1:21 PM 08/27/2023   12:36 PM  CMP  Glucose 70 - 99 mg/dL 815  713  665   BUN 8 - 23 mg/dL 22  32  19   Creatinine 0.61 - 1.24 mg/dL 9.11  8.83  9.08   Sodium 135 - 145 mmol/L 137  135  137   Potassium 3.5 - 5.1 mmol/L 4.4  4.5  4.4   Chloride 98 - 111 mmol/L 105  100  100   CO2 22 - 32 mmol/L 23  24  25    Calcium  8.9 - 10.3 mg/dL 9.5  9.6  9.4   Total Protein 6.5 - 8.1 g/dL 6.8  7.3  6.9   Total Bilirubin <1.2 mg/dL 0.4  0.4  0.4   Alkaline Phos 38 - 126 U/L 81  105  106   AST 15 - 41 U/L 12  13  20    ALT 0 - 44 U/L 11  17  22         RADIOGRAPHIC STUDIES: I have personally reviewed the radiological images as listed and agreed with the findings in the report. No results found.     I discussed the assessment and treatment plan with the patient. The patient was provided an opportunity to ask questions and all were answered. The patient agreed with the plan and demonstrated an understanding of the instructions.   The patient was advised to call back or seek an in-person evaluation if the symptoms worsen or if the condition fails to improve as anticipated.  I provided 25 minutes of face-to-face video visit time during this encounter, and > 50% was spent counseling as documented under my assessment & plan.     Onita Mattock, MD 10/01/23

## 2023-10-03 ENCOUNTER — Other Ambulatory Visit: Payer: Self-pay

## 2023-10-08 ENCOUNTER — Telehealth: Payer: Self-pay

## 2023-10-08 ENCOUNTER — Other Ambulatory Visit: Payer: Self-pay

## 2023-10-08 NOTE — Telephone Encounter (Signed)
 Beth from [facility] called stating she seen this patient today. He is very weak, having mid back pain that is as bad as his abd pain, unable to eat, and steady losing weight. Spoke to Dr. Lanny she wanted to have him see by app Powell Lessen NP. Will bring him in for labs and app with Heather. Called patient made him aware he agreed

## 2023-10-08 NOTE — Assessment & Plan Note (Addendum)
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  -baseline CA19.9 872 -EUS pancreatic mass biopsy confirmed adenocarcinoma. -He has seen surgeon Dr. Aron -We reviewed his case in tumor board, and recommended neoadjuvant chemotherapy.  Will repeat CT with pancreatic cancer protocol after neoadjuvant chemo. -Staging PET scan from 8/26 reviewed, no metastatic disease, there is some mild uptake in the periaortic lymph node, will monitor in the future scan.  There was also incidental finding of thyroid  nodule. A thyroid  ultrasound was ordered for evaluation. -He started first cycle of chemotherapy FOLFIRINOX on May 16, 2023. He tolerated first cycle moderately well with fatigue, cold sensitivity and abdominal pain etc, discussed management of side effect. -Due to significant weight loss and fatigue after first cycle chemo, his oxaliplatin  and irinotecan  doses were reduced from cycle 2. -He is not tolerating chemo well, his CA19.9 has not changed much since he started chemo. The option of changing treatment to gemcitabine  and abraxane was discussed.  -Restaging CT scan from July 04, 2023 showed partial response.   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  Unfortunately multiple liver nodule was noticed during the surgery, and all 3 liver biopsy showed metastatic disease.  Tumor superior mesenteric vein margin was positive, 1 out of 28 lymph nodes were positive. He had PR to neoadjuvant chemo  -It was recommended he restart chemo with gemcitabine  and Xeloda, but he has been recovering very slowly

## 2023-10-08 NOTE — Progress Notes (Addendum)
 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  -baseline CA19.9 872 -EUS pancreatic mass biopsy confirmed adenocarcinoma. -He has seen surgeon Dr. Cherlynn Cornfield -We reviewed his case in tumor board, and recommended neoadjuvant chemotherapy.  Will repeat CT with pancreatic cancer protocol after neoadjuvant chemo. -Staging PET scan from 8/26 reviewed, no metastatic disease, there is some mild uptake in the periaortic lymph node, will monitor in the future scan.  There was also incidental finding of thyroid  nodule. A thyroid  ultrasound was ordered for evaluation. -He started first cycle of chemotherapy FOLFIRINOX on May 16, 2023. He tolerated first cycle moderately well with fatigue, cold sensitivity and abdominal pain etc, discussed management of side effect. -Due to significant weight loss and fatigue after first cycle chemo, his oxaliplatin  and irinotecan  doses were reduced from cycle 2. -He is not tolerating chemo well, his CA19.9 has not changed much since he started chemo. The option of changing treatment to gemcitabine  and abraxane was discussed.  -Restaging CT scan from July 04, 2023 showed partial response.   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  Unfortunately multiple liver nodule was noticed during the surgery, and all 3 liver biopsy showed metastatic disease.  Tumor superior mesenteric vein margin was positive, 1 out of 28 lymph nodes were positive. He had PR to  neoadjuvant chemo  -It was recommended he restart chemo with gemcitabine  and Xeloda, but he has been recovering very slowly     Plan:  Labs reviewed  -CMP - K 4.4; glucose 162; BUN 18; Creatinine 0.91; eGFR >60; Ca 9.3; LFTs normal.   -ferritin - pending -Ca 19.9 pending Patient is orthostatic during today's visit.  -BP laying 130/77 -BP sitting 121/71 -BP standing 100/70 Offered patient option of getting IV fluids today. The patient declined this today. Would like to go home and get fluids orally if possible. He does understand that strict return protocols. He and his wife understand that they can contact the clinic and return for new or concerning symptoms.  He is scheduled to have CT CAP on Tuesday 10/15/2023. He is also scheduled to see surgeon on 10/15/2023. He has a follow up scheduled with Dr. Maryalice Smaller on 10/22/2023 to review CT results and plan for adjuvant cancer treatment.    The patient understands the plans discussed today and is in agreement with them.  He knows to contact our office if he develops concerns prior to his next appointment.  I provided 25 minutes of face-to-face time during this encounter and > 50% was spent counseling as documented under my assessment and plan.    Sharyon Deis, NP  [facility] - A DEPT OF [facility] [address] [place] [address] Dept: [phone] Dept Fax: [phone]   No orders of the defined types were placed in this encounter.     CHIEF COMPLAINT:  CC: Malignant neoplasm of pancreas  Current Treatment: Pending adjuvant chemotherapy Xeloda and gemcitabine .  Have been unable  to start due to pain, weakness, and poor appetite since surgery.  INTERVAL HISTORY:  Webster is here today for repeat clinical assessment.  He last had video visit with Dr. Maryalice Smaller on 10/01/2023.  Was having significant pain which was managed with morphine  and oxycodone .  Has not started  adjuvant chemotherapy due to weakness and difficulty gaining weight after surgery.  Cancer center received a call from home health stating patient was very weak and having mid back pain that is as bad as his abdominal pain.  The patient is unable to eat.  He continues to lose weight.  His appetite is poor. His weight has decreased 3 pounds over last 3 weeks . He has lost 13 pounds over past 6 weeks. He in asking about possibility of having feeding tube to help increase calorie intake. Is also asking how problematic it would be to delay restart of chemotherapy by another month. He states that his energy and activity levels are about 10% of his baseline. He is afraid that starting chemotherapy back when he is not getting enough calories, this will cause him to become sicker.   I have reviewed the past medical history, past surgical history, social history and family history with the patient and they are unchanged from previous note.  ALLERGIES:  is allergic to augmentin [amoxicillin-pot clavulanate].  MEDICATIONS:  Current Outpatient Medications  Medication Sig Dispense Refill   acetaminophen  (TYLENOL ) 500 MG tablet Take 2 tablets (1,000 mg total) by mouth every 6 (six) hours. 30 tablet 0   apixaban  (ELIQUIS ) 5 MG TABS tablet Take 1 tablet (5 mg total) by mouth 2 (two) times daily. 60 tablet 5   aspirin  EC 81 MG tablet Take 81 mg by mouth daily.     diazepam  (VALIUM ) 2 MG tablet Take 2 mg by mouth 2 (two) times daily as needed for anxiety.     diclofenac  Sodium (VOLTAREN ) 1 % GEL Apply 4 g topically 4 (four) times daily as needed (Up to 4x daily max).     esomeprazole (NEXIUM) 40 MG capsule Take 40 mg by mouth daily as needed (heartburn/hiccups).     fenofibrate  (TRICOR ) 48 MG tablet Take 48 mg by mouth daily.     gabapentin  (NEURONTIN ) 300 MG capsule Take 900 mg by mouth 2 (two) times daily.     Insulin  Glargine (BASAGLAR  KWIKPEN) 100 UNIT/ML Inject 8 Units into the skin daily. 15 mL 1   JARDIANCE  25 MG TABS tablet Take 25 mg by mouth daily.     megestrol  (MEGACE ) 40 MG/ML suspension Take 200 mg by mouth 2 (two) times daily.     metFORMIN  (GLUCOPHAGE -XR) 500 MG 24 hr tablet Take 1,000 mg by mouth daily after supper.     methocarbamol  (ROBAXIN ) 750 MG tablet Take 1 tablet (750 mg total) by mouth every 8 (eight) hours as needed for muscle spasms. 30 tablet 2   morphine  (MS CONTIN ) 15 MG 12 hr tablet Take 1 tablet (15 mg total) by mouth every 12 (twelve) hours. 60 tablet 0   ondansetron  (ZOFRAN ) 4 MG tablet Take 4 mg by mouth every 8 (eight) hours as needed for nausea or vomiting.     oxyCODONE  (OXY IR/ROXICODONE ) 5 MG immediate release tablet Take 1-2 tablets (5-10 mg total) by mouth every 4 (four) hours as needed for moderate pain (pain score 4-6) or breakthrough pain. 90 tablet 0   oxyCODONE -acetaminophen  (PERCOCET/ROXICET) 5-325 MG tablet Take 1 tablet by mouth every 6 (six) hours as needed for severe  pain (pain score 7-10). 40 tablet 0   polyethylene glycol (MIRALAX  / GLYCOLAX ) 17 g packet Take 17 g by mouth daily as needed for moderate constipation.     prochlorperazine  (COMPAZINE ) 10 MG tablet Take 1 tablet (10 mg total) by mouth every 6 (six) hours as needed for nausea or vomiting (Use for nausea and / or vomiting unresolved with ondansetron  (Zofran ).). 30 tablet 0   rosuvastatin  (CRESTOR ) 40 MG tablet Take 40 mg by mouth daily.     spironolactone  (ALDACTONE ) 25 MG tablet Take 25 mg by mouth daily.     sildenafil (VIAGRA) 100 MG tablet Take 100 mg by mouth as needed for erectile dysfunction. (Patient not taking: Reported on 10/09/2023)     No current facility-administered medications for this visit.    HISTORY OF PRESENT ILLNESS:   Oncology History Overview Note   Cancer Staging  Pancreatic cancer [facility]) Staging form: Exocrine Pancreas, AJCC 8th Edition - Clinical stage from 05/01/2023: Stage III (cT3, cN2, cM0) - Signed by Sonja Rocky Boy West, MD on 05/03/2023     Pancreatic cancer (HCC)   05/01/2023 Cancer Staging   Staging form: Exocrine Pancreas, AJCC 8th Edition - Clinical stage from 05/01/2023: Stage III (cT3, cN2, cM0) - Signed by Sonja Socorro, MD on 05/03/2023   05/02/2023 Initial Diagnosis   Pancreatic cancer (HCC)   05/16/2023 - 06/28/2023 Chemotherapy   Patient is on Treatment Plan : PANCREAS Modified FOLFIRINOX q14d x 4 cycles     07/09/2023 - 07/09/2023 Chemotherapy   Patient is on Treatment Plan : PANCREATIC Abraxane D1,8,15 + Gemcitabine  D1,8,15 q28d     08/13/2023 Surgery   Open distal sub total pancreatectomy, partial gastrectomy, and partial small bowel resection. Dr. Tawnya Fava [facility]       REVIEW OF SYSTEMS:   Constitutional: Denies fevers or chills. Having moderate fatigue and weakness. Feels like he is unable to eat normally. Has terrible taste in his mouth, making food unappetizing.  Eyes: Denies blurriness of vision Ears, nose, mouth, throat, and face: Denies mucositis or sore throat Respiratory: Denies cough, dyspnea or wheezes Cardiovascular: Denies palpitation, chest discomfort or lower extremity swelling Gastrointestinal:  Denies nausea, heartburn or change in bowel habits Skin: Denies abnormal skin rashes Lymphatics: Denies new lymphadenopathy or easy bruising Neurological:Denies numbness, tingling or new weaknesses Behavioral/Psych: Mood is stable, no new changes  All other systems were reviewed with the patient and are negative.   VITALS:   Today's Vitals   10/09/23 1153 10/09/23 1157  BP: 118/68   Pulse: 88   Resp: 17   Temp: (!) 97.2 F (36.2 C)   TempSrc: Temporal   SpO2: 98%   Weight: 125 lb 8 oz (56.9 kg)   PainSc:  4    Body mass index is 18.01 kg/m.   Wt Readings from Last 3 Encounters:  10/09/23 125 lb 8 oz (56.9 kg)  09/10/23 128 lb 1.6 oz (58.1 kg)  08/27/23 133 lb 14.4 oz (60.7 kg)    Body mass index is 18.01 kg/m.  Performance status (ECOG): 2 - Symptomatic, <50% confined to bed  PHYSICAL EXAM:    GENERAL:alert, no distress and comfortable SKIN: skin color, texture, turgor are normal, no rashes or significant lesions EYES: normal, Conjunctiva are pink and non-injected, sclera clear OROPHARYNX:no exudate, no erythema and lips, buccal mucosa, and tongue normal  NECK: supple, thyroid  normal size, non-tender, without nodularity LYMPH:  no palpable lymphadenopathy in the cervical, axillary or inguinal LUNGS: clear to auscultation and percussion with normal  breathing effort HEART: regular rate & rhythm and no murmurs and no lower extremity edema ABDOMEN:abdomen soft with hyperactive bowel sounds. He has generalized tenderness of the abdomen. Musculoskeletal:no cyanosis of digits and no clubbing  NEURO: alert & oriented x 3 with fluent speech, no focal motor/sensory deficits  LABORATORY DATA:  I have reviewed the data as listed    Component Value Date/Time   NA 134 (L) 10/09/2023 1102   NA 140 01/17/2021 1402   K 4.4 10/09/2023 1102   CL 100 10/09/2023 1102   CO2 26 10/09/2023 1102   GLUCOSE 162 (H) 10/09/2023 1102   BUN 18 10/09/2023 1102   BUN 25 01/17/2021 1402   CREATININE 0.91 10/09/2023 1102   CREATININE 1.05 07/23/2016 0929   CALCIUM  9.3 10/09/2023 1102   PROT 6.4 (L) 10/09/2023 1102   PROT 6.5 10/23/2017 0933   ALBUMIN  3.5 10/09/2023 1102   ALBUMIN  4.2 10/23/2017 0933   AST 15 10/09/2023 1102   ALT 13 10/09/2023 1102   ALKPHOS 94 10/09/2023 1102   BILITOT 0.4 10/09/2023 1102   GFRNONAA >60 10/09/2023 1102   GFRAA >60 09/03/2018 1527     Lab Results  Component Value Date   WBC 12.8 (H) 09/10/2023   NEUTROABS 8.2 (H) 09/10/2023   HGB 14.0 09/10/2023   HCT 41.3 09/10/2023   MCV 86.6 09/10/2023   PLT 576 (H) 09/10/2023   Addendum I have seen the patient, examined him. I agree with the assessment and and plan and have edited the notes.   [name] is not doing very well clinically, with persistent anorexia, abdominal pain and back pain which required frequent  narcotics, fatigue and weight loss.  He wants to know if he needs a feeding tube.  Since he is tolerating Ensure very well, I do not see the need for feeding tube, I encouraged him to increase Ensure intake.  I have high clinical concern he has cancer progression.  He is scheduled for restaging CT scan next week.  He would like to postpone chemo as long as possible.  Will call him next week to review the CT scan results.  All questions were answered.  I spent a total of 25 minutes for his visit today, more than 50% time on face-to-face counseling.  Sonja York MD 10/09/2023

## 2023-10-09 ENCOUNTER — Inpatient Hospital Stay: Payer: Medicare Other

## 2023-10-09 ENCOUNTER — Other Ambulatory Visit: Payer: Self-pay

## 2023-10-09 ENCOUNTER — Encounter: Payer: Self-pay | Admitting: Hematology

## 2023-10-09 ENCOUNTER — Inpatient Hospital Stay: Payer: Medicare Other | Admitting: Nurse Practitioner

## 2023-10-09 VITALS — BP 118/68 | HR 88 | Temp 97.2°F | Resp 17 | Wt 125.5 lb

## 2023-10-09 DIAGNOSIS — Z9081 Acquired absence of spleen: Secondary | ICD-10-CM | POA: Diagnosis not present

## 2023-10-09 DIAGNOSIS — M549 Dorsalgia, unspecified: Secondary | ICD-10-CM | POA: Diagnosis not present

## 2023-10-09 DIAGNOSIS — R109 Unspecified abdominal pain: Secondary | ICD-10-CM | POA: Diagnosis not present

## 2023-10-09 DIAGNOSIS — D649 Anemia, unspecified: Secondary | ICD-10-CM

## 2023-10-09 DIAGNOSIS — R634 Abnormal weight loss: Secondary | ICD-10-CM | POA: Diagnosis not present

## 2023-10-09 DIAGNOSIS — C252 Malignant neoplasm of tail of pancreas: Secondary | ICD-10-CM

## 2023-10-09 DIAGNOSIS — K8689 Other specified diseases of pancreas: Secondary | ICD-10-CM

## 2023-10-09 DIAGNOSIS — Z95828 Presence of other vascular implants and grafts: Secondary | ICD-10-CM

## 2023-10-09 DIAGNOSIS — R5383 Other fatigue: Secondary | ICD-10-CM | POA: Diagnosis not present

## 2023-10-09 DIAGNOSIS — K7689 Other specified diseases of liver: Secondary | ICD-10-CM | POA: Diagnosis not present

## 2023-10-09 DIAGNOSIS — R197 Diarrhea, unspecified: Secondary | ICD-10-CM | POA: Diagnosis not present

## 2023-10-09 LAB — CMP (CANCER CENTER ONLY)
ALT: 13 U/L (ref 0–44)
AST: 15 U/L (ref 15–41)
Albumin: 3.5 g/dL (ref 3.5–5.0)
Alkaline Phosphatase: 94 U/L (ref 38–126)
Anion gap: 8 (ref 5–15)
BUN: 18 mg/dL (ref 8–23)
CO2: 26 mmol/L (ref 22–32)
Calcium: 9.3 mg/dL (ref 8.9–10.3)
Chloride: 100 mmol/L (ref 98–111)
Creatinine: 0.91 mg/dL (ref 0.61–1.24)
GFR, Estimated: >60 mL/min (ref >60)
Glucose, Bld: 162 mg/dL — ABNORMAL HIGH (ref 70–99)
Potassium: 4.4 mmol/L (ref 3.5–5.1)
Sodium: 134 mmol/L — ABNORMAL LOW (ref 135–145)
Total Bilirubin: 0.4 mg/dL (ref 0.0–1.2)
Total Protein: 6.4 g/dL — ABNORMAL LOW (ref 6.5–8.1)

## 2023-10-09 LAB — FERRITIN: Ferritin: 103 ng/mL (ref 24–336)

## 2023-10-09 MED ORDER — SODIUM CHLORIDE 0.9% FLUSH
10.0000 mL | Freq: Once | INTRAVENOUS | Status: AC
  Administered 2023-10-09: 10 mL

## 2023-10-09 MED ORDER — HEPARIN SOD (PORK) LOCK FLUSH 100 UNIT/ML IV SOLN
500.0000 [IU] | Freq: Once | INTRAVENOUS | Status: AC
  Administered 2023-10-09: 500 [IU]

## 2023-10-10 ENCOUNTER — Encounter: Payer: Self-pay | Admitting: Hematology

## 2023-10-10 LAB — CANCER ANTIGEN 19-9: CA 19-9: 547 U/mL — ABNORMAL HIGH (ref 0–35)

## 2023-10-14 ENCOUNTER — Telehealth: Payer: Self-pay

## 2023-10-14 NOTE — Telephone Encounter (Signed)
Patients wife called in stating that patient has been having diarrhea the past few days. He is having cramping with the diarrhea. They stated they were not taking imodium after each bowel movement but only taking it twice a day. I instructed the patient that they could take the imodium 2 pills after each bowel movement and not to exceed 8 pills a day. They stated they would try that and if it didn't work they would call us back.

## 2023-10-15 ENCOUNTER — Ambulatory Visit (HOSPITAL_COMMUNITY)
Admission: RE | Admit: 2023-10-15 | Discharge: 2023-10-15 | Disposition: A | Discharge status: Home / Self Care | Payer: Medicare Other | Source: Ambulatory Visit | Attending: Hematology | Admitting: Hematology

## 2023-10-15 DIAGNOSIS — C252 Malignant neoplasm of tail of pancreas: Secondary | ICD-10-CM | POA: Insufficient documentation

## 2023-10-15 MED ORDER — IOHEXOL 300 MG/ML  SOLN
100.0000 mL | Freq: Once | INTRAMUSCULAR | Status: AC | PRN
  Administered 2023-10-15: 100 mL via INTRAVENOUS

## 2023-10-21 ENCOUNTER — Other Ambulatory Visit: Payer: Self-pay

## 2023-10-21 DIAGNOSIS — K8689 Other specified diseases of pancreas: Secondary | ICD-10-CM

## 2023-10-21 DIAGNOSIS — C252 Malignant neoplasm of tail of pancreas: Secondary | ICD-10-CM

## 2023-10-22 ENCOUNTER — Inpatient Hospital Stay: Payer: Medicare Other

## 2023-10-22 ENCOUNTER — Encounter: Payer: Self-pay | Admitting: Hematology

## 2023-10-22 ENCOUNTER — Inpatient Hospital Stay (HOSPITAL_BASED_OUTPATIENT_CLINIC_OR_DEPARTMENT_OTHER): Payer: Medicare Other | Admitting: Hematology

## 2023-10-22 VITALS — BP 143/78 | HR 77 | Temp 98.2°F | Resp 16 | Wt 124.7 lb

## 2023-10-22 DIAGNOSIS — C252 Malignant neoplasm of tail of pancreas: Secondary | ICD-10-CM

## 2023-10-22 DIAGNOSIS — Z95828 Presence of other vascular implants and grafts: Secondary | ICD-10-CM

## 2023-10-22 DIAGNOSIS — K8689 Other specified diseases of pancreas: Secondary | ICD-10-CM

## 2023-10-22 LAB — CBC WITH DIFFERENTIAL (CANCER CENTER ONLY)
Abs Immature Granulocytes: 0.03 10*3/uL (ref 0.00–0.07)
Basophils Absolute: 0.1 10*3/uL (ref 0.0–0.1)
Basophils Relative: 1 %
Eosinophils Absolute: 0.1 10*3/uL (ref 0.0–0.5)
Eosinophils Relative: 1 %
HCT: 40.9 % (ref 39.0–52.0)
Hemoglobin: 13.3 g/dL (ref 13.0–17.0)
Immature Granulocytes: 0 %
Lymphocytes Relative: 29 %
Lymphs Abs: 3.1 10*3/uL (ref 0.7–4.0)
MCH: 27.5 pg (ref 26.0–34.0)
MCHC: 32.5 g/dL (ref 30.0–36.0)
MCV: 84.7 fL (ref 80.0–100.0)
Monocytes Absolute: 1.1 10*3/uL — ABNORMAL HIGH (ref 0.1–1.0)
Monocytes Relative: 10 %
Neutro Abs: 6.3 10*3/uL (ref 1.7–7.7)
Neutrophils Relative %: 59 %
Platelet Count: 612 10*3/uL — ABNORMAL HIGH (ref 150–400)
RBC: 4.83 MIL/uL (ref 4.22–5.81)
RDW: 16.7 % — ABNORMAL HIGH (ref 11.5–15.5)
WBC Count: 10.7 10*3/uL — ABNORMAL HIGH (ref 4.0–10.5)
nRBC: 0 % (ref 0.0–0.2)

## 2023-10-22 LAB — CMP (CANCER CENTER ONLY)
ALT: 10 U/L (ref 0–44)
AST: 14 U/L — ABNORMAL LOW (ref 15–41)
Albumin: 3.5 g/dL (ref 3.5–5.0)
Alkaline Phosphatase: 78 U/L (ref 38–126)
Anion gap: 7 (ref 5–15)
BUN: 19 mg/dL (ref 8–23)
CO2: 26 mmol/L (ref 22–32)
Calcium: 9.3 mg/dL (ref 8.9–10.3)
Chloride: 103 mmol/L (ref 98–111)
Creatinine: 0.82 mg/dL (ref 0.61–1.24)
GFR, Estimated: >60 mL/min (ref >60)
Glucose, Bld: 135 mg/dL — ABNORMAL HIGH (ref 70–99)
Potassium: 4.3 mmol/L (ref 3.5–5.1)
Sodium: 136 mmol/L (ref 135–145)
Total Bilirubin: 0.4 mg/dL (ref 0.0–1.2)
Total Protein: 6.6 g/dL (ref 6.5–8.1)

## 2023-10-22 MED ORDER — MORPHINE SULFATE ER 15 MG PO TBCR: 15.0000 mg | EXTENDED_RELEASE_TABLET | Freq: Two times a day (BID) | ORAL | 0 refills | Status: DC

## 2023-10-22 MED ORDER — HEPARIN SOD (PORK) LOCK FLUSH 100 UNIT/ML IV SOLN
500.0000 [IU] | Freq: Once | INTRAVENOUS | Status: AC
  Administered 2023-10-22: 500 [IU]

## 2023-10-22 MED ORDER — SODIUM CHLORIDE 0.9% FLUSH
10.0000 mL | Freq: Once | INTRAVENOUS | Status: AC
  Administered 2023-10-22: 10 mL

## 2023-10-22 MED ORDER — OXYCODONE HCL 5 MG PO TABS: 5.0000 mg | ORAL_TABLET | ORAL | 0 refills | Status: DC | PRN

## 2023-10-22 NOTE — Assessment & Plan Note (Signed)
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  -baseline CA19.9 872 -EUS pancreatic mass biopsy confirmed adenocarcinoma. -He has seen surgeon [name] -We reviewed his case in tumor board, and recommended neoadjuvant chemotherapy.  Will repeat CT with pancreatic cancer protocol after neoadjuvant chemo. -Staging PET scan from 8/26 reviewed, no metastatic disease, there is some mild uptake in the periaortic lymph node, will monitor in the future scan.  I also discussed incidental finding of thyroid nodule, I ordered thyroid ultrasound for evaluation. -He started first cycle of chemotherapy FOLFIRINOX on May 16, 2023. He tolerated first cycle moderately well with fatigue, cold sensitivity and abdominal pain etc, discussed management of side effect. -Due to significant weight loss and fatigue after first cycle chemo, I reduced his oxaliplatin and irinotecan dose from cycle 2. -He is not tolerating chemo well, his CA19.9 has not changed much since he started chemo. I the option of changing treatment to gemcitabine and abraxane.  -Restaging CT scan from July 04, 2023 showed partial response.   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  Unfortunately multiple liver nodule was noticed during the surgery, and all 3 liver biopsy showed metastatic disease.  Tumor superior mesenteric vein margin was positive, 1 out of 28 lymph nodes were positive. 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 This also abnormal tissue abutting  the margin of the SMA and celiac. This is worrisome for local spread and worsening disease

## 2023-10-22 NOTE — Progress Notes (Signed)
[facility]   Telephone:([phone] Fax:([phone]) [phone]   Clinic Follow up Note   Patient Care Team: [name], MD as PCP - General [name], [name], MD as PCP - Cardiology (Cardiology) [name], MD as Consulting Physician (Hematology)  Date of Service:  10/22/2023  CHIEF COMPLAINT: f/u of pancreatic cancer  CURRENT THERAPY:  Pending chemotherapy  Oncology History   Pancreatic cancer (HCC) -cT3N0M0, stage IIA --Patient presented with abdominal pain and weight loss, CT scan revealed a 4.4 x 3.5 cm ill-defined mass in the pancreatic tail, which 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  -baseline CA19.9 872 -EUS pancreatic mass biopsy confirmed adenocarcinoma. -He has seen surgeon [name] -We reviewed his case in tumor board, and recommended neoadjuvant chemotherapy.  Will repeat CT with pancreatic cancer protocol after neoadjuvant chemo. -Staging PET scan from 8/26 reviewed, no metastatic disease, there is some mild uptake in the periaortic lymph node, will monitor in the future scan.  I also discussed incidental finding of thyroid nodule, I ordered thyroid ultrasound for evaluation. -He started first cycle of chemotherapy FOLFIRINOX on May 16, 2023. He tolerated first cycle moderately well with fatigue, cold sensitivity and abdominal pain etc, discussed management of side effect. -Due to significant weight loss and fatigue after first cycle chemo, I reduced his oxaliplatin and irinotecan dose from cycle 2. -He is not tolerating chemo well, his CA19.9 has not changed much since he started chemo. I the option of changing treatment to gemcitabine and abraxane.  -Restaging CT scan from July 04, 2023 showed partial response.   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  Unfortunately multiple liver nodule was noticed during the surgery, and all 3 liver biopsy showed metastatic disease.   Tumor superior mesenteric vein margin was positive, 1 out of 28 lymph nodes were positive. 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 This also abnormal tissue abutting the margin of the SMA and celiac. This is worrisome for local spread and worsening disease    Assessment and Plan    Pancreatic Cancer Post-surgical status with significant pain and recent explosive diarrhea. Concerns for cancer recurrence based on CT scan showing enlarged lymph nodes and fluid collection. Tumor marker increased from 225 post-surgery to 500, indicating possible recurrence. Discussed potential recurrence and difficulty confirming without biopsy. Reviewed chemotherapy options, benefits, and risks. Patient expressed concerns about starting chemotherapy now due to physical weakness and desire to build strength. Discussed starting with a less aggressive regimen and potential outcomes. Explained that delaying chemotherapy could worsen cancer related symptoms and reduce treatment effectiveness. - Order PET scan - Order Signatera blood test - Discuss case with [name] and other specialists in GI conference  - Follow-up in 2-3 weeks to review PET scan and blood test results - Refer to palliative care for pain management (appointment on February 3rd)  Abdominal pain  Persistent significant pain post-pancreatic surgery, impacting daily activities. Currently on oxycodone and morphine. Pain may be related to scar tissue or inflammation rather than cancer recurrence. - Refill oxycodone prescription - Monitor pain levels and adjust medication as needed - Discuss pain management strategies with palliative care team  Diarrhea Recent episode of explosive diarrhea  leading to significant weight loss (7 pounds). Currently using Imodium. Diarrhea may be related to pancreatic insufficiency. - Continue Creon (pancreatic enzyme replacement therapy) - Monitor for further episodes of diarrhea and adjust Imodium usage as needed  General Health Maintenance Discussed importance of managing overall health and quality of life. Encouraged patient to consider goals of care given potential cancer recurrence and impact on quality of life. - Encourage discussion of goals of care with palliative care team - Consider quality of life and symptom management in treatment planning  Plan -I again encouraged patient to consider chemotherapy, he again requested more time to see if he can get stronger -Will review his case in GI conference to to see if we can biopsy to confirm metastatic recurrence - Follow-up in 2-3 weeks to review PET scan and Signatera blood test results         SUMMARY OF ONCOLOGIC HISTORY: Oncology History Overview Note   Cancer Staging  Pancreatic cancer [facility]) Staging form: Exocrine Pancreas, AJCC 8th Edition - Clinical stage from 05/01/2023: Stage III (cT3, cN2, cM0) - Signed by [name], MD on 05/03/2023     Pancreatic cancer (HCC)  05/01/2023 Cancer Staging   Staging form: Exocrine Pancreas, AJCC 8th Edition - Clinical stage from 05/01/2023: Stage III (cT3, cN2, cM0) - Signed by [name], MD on 05/03/2023   05/02/2023 Initial Diagnosis   Pancreatic cancer (HCC)   05/16/2023 - 06/28/2023 Chemotherapy   Patient is on Treatment Plan : PANCREAS Modified FOLFIRINOX q14d x 4 cycles     07/09/2023 - 07/09/2023 Chemotherapy   Patient is on Treatment Plan : PANCREATIC Abraxane D1,8,15 + Gemcitabine D1,8,15 q28d     08/13/2023 Surgery   Open distal sub total pancreatectomy, partial gastrectomy, and partial small bowel resection. Dr. [name] [facility]      Discussed the use of AI scribe software for clinical note transcription with the  patient, who gave verbal consent to proceed.  History of Present Illness   A 73 year old individual with a history of pancreatic cancer, status post-surgery in November, presents with ongoing significant pain. The pain is described as severe and constant, requiring daily pain medication. The individual reports struggling with recovery from the surgery, with good and bad days. On good days, he is able to sit up and engage in activities such as managing finances. However, on bad days, the pain is debilitating.  The individual also reports a significant episode of diarrhea the previous weekend, which was described as "explosive" and resulted in a weight loss of seven pounds. The diarrhea was accompanied by severe cramping and straining. The individual has been taking Creon, a pancreatic enzyme supplement, which was prescribed by another doctor.  The individual expresses frustration with the slow recovery and the ongoing pain, which has made it difficult to eat and regain strength. He expresses a desire to delay chemotherapy in order to build up strength and improve his ability to eat.         All other systems were reviewed with the patient and are negative.  MEDICAL HISTORY:  Past Medical History:  Diagnosis Date   Arthritis    Bilateral Knees/Fingers   CAD (coronary artery disease)    Cancer (HCC) 2024   Pancreatic Cancer   Cerebrovascular disease    Diabetes mellitus without complication (HCC)    Dysrhythmia    Hx. A.Fib. Been NSR since ablation   History of kidney stones    HLD (hyperlipidemia)    HTN (hypertension)  IBS (irritable bowel syndrome)    Patient states did not have   Lumbar stenosis    MI 1996   Persistent atrial fibrillation (HCC)    PVD (peripheral vascular disease) (HCC)    Stroke (cerebrum) (HCC)    Tobacco abuse     SURGICAL HISTORY: Past Surgical History:  Procedure Laterality Date   ABDOMINAL AORTOGRAM W/LOWER EXTREMITY N/A 01/25/2017   Procedure:  Abdominal Aortogram w/Lower Extremity;  Surgeon: Sherren Kerns, MD;  Location: [facility] INVASIVE CV LAB;  Service: Cardiovascular;  Laterality: N/A;   ANTERIOR CRUCIATE LIGAMENT REPAIR     BOWEL RESECTION N/A 08/13/2023   Procedure: PARTIAL SMALL BOWEL RESECTION;  Surgeon: Almond Lint, MD;  Location: [facility] OR;  Service: General;  Laterality: N/A;   CARDIAC CATHETERIZATION     CONE   CARDIAC CATHETERIZATION N/A 07/27/2016   Procedure: Left Heart Cath and Coronary Angiography;  Surgeon: Tonny Bollman, MD;  Location: [facility] INVASIVE CV LAB;  Service: Cardiovascular;  Laterality: N/A;   CARDIOVERSION N/A 06/29/2016   Procedure: CARDIOVERSION;  Surgeon: Pricilla Riffle, MD;  Location: [facility] ENDOSCOPY;  Service: Cardiovascular;  Laterality: N/A;   CARDIOVERSION N/A 03/05/2018   Procedure: CARDIOVERSION;  Surgeon: Laurey Morale, MD;  Location: [facility] ENDOSCOPY;  Service: Cardiovascular;  Laterality: N/A;   CHOLECYSTECTOMY     ENDARTERECTOMY Right 12/06/2017   Procedure: ENDARTERECTOMY CAROTID RIGHT;  Surgeon: Nada Libman, MD;  Location: [facility] OR;  Service: Vascular;  Laterality: Right;   ESOPHAGOGASTRODUODENOSCOPY (EGD) WITH PROPOFOL N/A 05/01/2023   Procedure: ESOPHAGOGASTRODUODENOSCOPY (EGD) WITH PROPOFOL;  Surgeon: Willis Modena, MD;  Location: [facility] ENDOSCOPY;  Service: Gastroenterology;  Laterality: N/A;   FINE NEEDLE ASPIRATION N/A 05/01/2023   Procedure: FINE NEEDLE ASPIRATION (FNA) LINEAR;  Surgeon: Willis Modena, MD;  Location: [facility] ENDOSCOPY;  Service: Gastroenterology;  Laterality: N/A;   KNEE SURGERY Bilateral 1982, 1988, 1997, 2000   x 4 times.   LAPAROSCOPY N/A 08/13/2023   Procedure: LAPAROSCOPY DIAGNOSTIC;  Surgeon: Almond Lint, MD;  Location: [facility] OR;  Service: General;  Laterality: N/A;   LOWER EXTREMITY ANGIOGRAPHY N/A 07/03/2019   Procedure: LOWER EXTREMITY ANGIOGRAPHY;  Surgeon: Sherren Kerns, MD;  Location: [facility] INV[facility] CV LAB;  Service: Cardiovascular;  Laterality: N/A;   LUMBAR  LAMINECTOMY/DECOMPRESSION MICRODISCECTOMY Bilateral 09/09/2018   Procedure: Laminectomy and Foraminotomy bilateral Lumbar three-Lumbar four - Lumbar four-Lumbar five;  Surgeon: Julio Sicks, MD;  Location: [facility] OR;  Service: Neurosurgery;  Laterality: Bilateral;   PARTIAL GASTRECTOMY N/A 08/13/2023   Procedure: PARTIAL GASTRECTOMY;  Surgeon: Almond Lint, MD;  Location: [facility] OR;  Service: General;  Laterality: N/A;   PATCH ANGIOPLASTY Right 12/06/2017   Procedure: PATCH ANGIOPLASTY RIGHT CAROTID ARTERY;  Surgeon: Nada Libman, MD;  Location: [facility] OR;  Service: Vascular;  Laterality: Right;   PERIPHERAL VASCULAR INTERVENTION Right 01/25/2017   Procedure: Peripheral Vascular Intervention;  Surgeon: Sherren Kerns, MD;  Location: [facility] INVASIVE CV LAB;  Service: Cardiovascular;  Laterality: Right;   PERIPHERAL VASCULAR INTERVENTION Right 07/03/2019   Procedure: PERIPHERAL VASCULAR INTERVENTION;  Surgeon: Sherren Kerns, MD;  Location: [facility] LAB;  Service: Cardiovascular;  Laterality: Right;   PORTACATH PLACEMENT Left 05/14/2023   Procedure: INSERTION PORT-A-CATH;  Surgeon: Almond Lint, MD;  Location: [facility] OR;  Service: General;  Laterality: Left;   TONSILLECTOMY     UPPER ESOPHAGEAL ENDOSCOPIC ULTRASOUND (EUS) Bilateral 05/01/2023   Procedure: UPPER ESOPHAGEAL ENDOSCOPIC ULTRASOUND (EUS);  Surgeon: Willis Modena, MD;  Location: [facility] ENDOSCOPY;  Service: Gastroenterology;  Laterality: Bilateral;  XI ROBOTIC ASSISTED LAPAROSCOPIC DISTAL PANCREATECTOMY N/A 08/13/2023   Procedure: HAND ASSISTED LAPAROSCOPIC SPLENECTOMY;  Surgeon: Almond Lint, MD;  Location: [facility] OR;  Service: General;  Laterality: N/A;    I have reviewed the social history and family history with the patient and they are unchanged from previous note.  ALLERGIES:  is allergic to augmentin [amoxicillin-pot clavulanate].  MEDICATIONS:  Current Outpatient Medications  Medication Sig Dispense Refill   acetaminophen (TYLENOL)  500 MG tablet Take 2 tablets (1,000 mg total) by mouth every 6 (six) hours. 30 tablet 0   apixaban (ELIQUIS) 5 MG TABS tablet Take 1 tablet (5 mg total) by mouth 2 (two) times daily. 60 tablet 5   aspirin EC 81 MG tablet Take 81 mg by mouth daily.     diazepam (VALIUM) 2 MG tablet Take 2 mg by mouth 2 (two) times daily as needed for anxiety.     diclofenac Sodium (VOLTAREN) 1 % GEL Apply 4 g topically 4 (four) times daily as needed (Up to 4x daily max).     esomeprazole (NEXIUM) 40 MG capsule Take 40 mg by mouth daily as needed (heartburn/hiccups).     fenofibrate (TRICOR) 48 MG tablet Take 48 mg by mouth daily.     gabapentin (NEURONTIN) 300 MG capsule Take 900 mg by mouth 2 (two) times daily.     Insulin Glargine (BASAGLAR KWIKPEN) 100 UNIT/ML Inject 8 Units into the skin daily. 15 mL 1   JARDIANCE 25 MG TABS tablet Take 25 mg by mouth daily.     megestrol (MEGACE) 40 MG/ML suspension Take 200 mg by mouth 2 (two) times daily.     metFORMIN (GLUCOPHAGE-XR) 500 MG 24 hr tablet Take 1,000 mg by mouth daily after supper.     methocarbamol (ROBAXIN) 750 MG tablet Take 1 tablet (750 mg total) by mouth every 8 (eight) hours as needed for muscle spasms. 30 tablet 2   morphine (MS CONTIN) 15 MG 12 hr tablet Take 1 tablet (15 mg total) by mouth every 12 (twelve) hours. 60 tablet 0   ondansetron (ZOFRAN) 4 MG tablet Take 4 mg by mouth every 8 (eight) hours as needed for nausea or vomiting.     oxyCODONE (OXY IR/ROXICODONE) 5 MG immediate release tablet Take 1-2 tablets (5-10 mg total) by mouth every 4 (four) hours as needed for moderate pain (pain score 4-6) or breakthrough pain. 120 tablet 0   oxyCODONE-acetaminophen (PERCOCET/ROXICET) 5-325 MG tablet Take 1 tablet by mouth every 6 (six) hours as needed for severe pain (pain score 7-10). 40 tablet 0   polyethylene glycol (MIRALAX / GLYCOLAX) 17 g packet Take 17 g by mouth daily as needed for moderate constipation.     prochlorperazine (COMPAZINE) 10 MG  tablet Take 1 tablet (10 mg total) by mouth every 6 (six) hours as needed for nausea or vomiting (Use for nausea and / or vomiting unresolved with ondansetron (Zofran).). 30 tablet 0   rosuvastatin (CRESTOR) 40 MG tablet Take 40 mg by mouth daily.     sildenafil (VIAGRA) 100 MG tablet Take 100 mg by mouth as needed for erectile dysfunction. (Patient not taking: Reported on 10/09/2023)     spironolactone (ALDACTONE) 25 MG tablet Take 25 mg by mouth daily.     No current facility-administered medications for this visit.    PHYSICAL EXAMINATION: ECOG PERFORMANCE STATUS: 2 - Symptomatic, <50% confined to bed  Vitals:   10/22/23 1144  BP: (!) 143/78  Pulse: 77  Resp: 16  Temp:  98.2 F (36.8 C)  SpO2: 100%   Wt Readings from Last 3 Encounters:  10/22/23 124 lb 11.2 oz (56.6 kg)  10/09/23 125 lb 8 oz (56.9 kg)  09/10/23 128 lb 1.6 oz (58.1 kg)     GENERAL:alert, no distress and comfortable SKIN: skin color, texture, turgor are normal, no rashes or significant lesions EYES: normal, Conjunctiva are pink and non-injected, sclera clear Musculoskeletal:no cyanosis of digits and no clubbing  NEURO: alert & oriented x 3 with fluent speech, no focal motor/sensory deficits   LABORATORY DATA:  I have reviewed the data as listed    Latest Ref Rng & Units 10/22/2023   11:22 AM 09/10/2023   10:20 AM 09/03/2023    1:21 PM  CBC  WBC 4.0 - 10.5 K/uL 10.7  12.8  11.6   Hemoglobin 13.0 - 17.0 g/dL 14.7  82.9  56.2   Hematocrit 39.0 - 52.0 % 40.9  41.3  40.5   Platelets 150 - 400 K/uL 612  576  808         Latest Ref Rng & Units 10/22/2023   11:22 AM 10/09/2023   11:02 AM 09/10/2023   10:20 AM  CMP  Glucose 70 - 99 mg/dL 130  865  784   BUN 8 - 23 mg/dL 19  18  22    Creatinine 0.61 - 1.24 mg/dL 6.96  2.95  2.84   Sodium 135 - 145 mmol/L 136  134  137   Potassium 3.5 - 5.1 mmol/L 4.3  4.4  4.4   Chloride 98 - 111 mmol/L 103  100  105   CO2 22 - 32 mmol/L 26  26  23    Calcium 8.9 - 10.3  mg/dL 9.3  9.3  9.5   Total Protein 6.5 - 8.1 g/dL 6.6  6.4  6.8   Total Bilirubin 0.0 - 1.2 mg/dL 0.4  0.4  0.4   Alkaline Phos 38 - 126 U/L 78  94  81   AST 15 - 41 U/L 14  15  12    ALT 0 - 44 U/L 10  13  11        RADIOGRAPHIC STUDIES: I have personally reviewed the radiological images as listed and agreed with the findings in the report. No results found.    Orders Placed This Encounter  Procedures   NM PET Image Restage (PS) Skull Base to Thigh (F-18 FDG)    Standing Status:   Future    Expected Date:   10/29/2023    Expiration Date:   10/21/2024    If indicated for the ordered procedure, I authorize the administration of a radiopharmaceutical per Radiology protocol:   Yes    Preferred imaging location?:   [facility]   All questions were answered. The patient knows to call the clinic with any problems, questions or concerns. No barriers to learning was detected. The total time spent in the appointment was 40 minutes.     [name], MD 10/22/2023

## 2023-10-25 NOTE — Progress Notes (Unsigned)
Palliative Medicine [facility]  [phone] [phone]) [phone]   Name: Scott [name] Date: 10/25/2023 MRN: [id number]  DOB: 06/07/1951  Patient Care Team: [name], MD as PCP - General [name], [name], MD as PCP - Cardiology (Cardiology) [name], MD as Consulting Physician (Hematology)    REASON FOR CONSULTATION: Scott [name] is a 73 y.o. male with oncologic medical history including pancreatic cancer (04/2023) as well as a-fib, HTN, HLD, arthritis, type 2 DM, and arthritis.  Palliative ask to see for symptom management and goals of care.    SOCIAL HISTORY:     reports that he has been smoking cigarettes. He started smoking about 52 years ago. He has a 50 pack-year smoking history. He has never used smokeless tobacco. He reports current alcohol use. He reports that he does not use drugs.  ADVANCE DIRECTIVES:  None on file  CODE STATUS: Full code  PAST MEDICAL HISTORY: Past Medical History:  Diagnosis Date   Arthritis    Bilateral Knees/Fingers   CAD (coronary artery disease)    Cancer (HCC) 2024   Pancreatic Cancer   Cerebrovascular disease    Diabetes mellitus without complication (HCC)    Dysrhythmia    Hx. A.Fib. Been NSR since ablation   History of kidney stones    HLD (hyperlipidemia)    HTN (hypertension)    IBS (irritable bowel syndrome)    Patient states did not have   Lumbar stenosis    MI 1996   Persistent atrial fibrillation (HCC)    PVD (peripheral vascular disease) (HCC)    Stroke (cerebrum) (HCC)    Tobacco abuse     PAST SURGICAL HISTORY:  Past Surgical History:  Procedure Laterality Date   ABDOMINAL AORTOGRAM W/LOWER EXTREMITY N/A 01/25/2017   Procedure: Abdominal Aortogram w/Lower Extremity;  Surgeon: Sherren Kerns, MD;  Location: [facility] INVASIVE CV LAB;  Service: Cardiovascular;  Laterality: N/A;   ANTERIOR CRUCIATE LIGAMENT REPAIR     BOWEL RESECTION N/A 08/13/2023   Procedure: PARTIAL  SMALL BOWEL RESECTION;  Surgeon: Almond Lint, MD;  Location: [facility] OR;  Service: General;  Laterality: N/A;   CARDIAC CATHETERIZATION     CONE   CARDIAC CATHETERIZATION N/A 07/27/2016   Procedure: Left Heart Cath and Coronary Angiography;  Surgeon: Tonny Bollman, MD;  Location: [facility] INVASIVE CV LAB;  Service: Cardiovascular;  Laterality: N/A;   CARDIOVERSION N/A 06/29/2016   Procedure: CARDIOVERSION;  Surgeon: Pricilla Riffle, MD;  Location: [facility] ENDOSCOPY;  Service: Cardiovascular;  Laterality: N/A;   CARDIOVERSION N/A 03/05/2018   Procedure: CARDIOVERSION;  Surgeon: Laurey Morale, MD;  Location: [facility] ENDOSCOPY;  Service: Cardiovascular;  Laterality: N/A;   CHOLECYSTECTOMY     ENDARTERECTOMY Right 12/06/2017   Procedure: ENDARTERECTOMY CAROTID RIGHT;  Surgeon: Nada Libman, MD;  Location: [facility] OR;  Service: Vascular;  Laterality: Right;   ESOPHAGOGASTRODUODENOSCOPY (EGD) WITH PROPOFOL N/A 05/01/2023   Procedure: ESOPHAGOGASTRODUODENOSCOPY (EGD) WITH PROPOFOL;  Surgeon: Willis Modena, MD;  Location: [facility] ENDOSCOPY;  Service: Gastroenterology;  Laterality: N/A;   FINE NEEDLE ASPIRATION N/A 05/01/2023   Procedure: FINE NEEDLE ASPIRATION (FNA) LINEAR;  Surgeon: Willis Modena, MD;  Location: [facility] ENDOSCOPY;  Service: Gastroenterology;  Laterality: N/A;   KNEE SURGERY Bilateral 1982, 1988, 1997, 2000   x 4 times.   LAPAROSCOPY N/A 08/13/2023   Procedure: LAPAROSCOPY DIAGNOSTIC;  Surgeon: Almond Lint, MD;  Location: [facility] OR;  Service: General;  Laterality: N/A;   LOWER EXTREMITY ANGIOGRAPHY N/A 07/03/2019  Procedure: LOWER EXTREMITY ANGIOGRAPHY;  Surgeon: Sherren Kerns, MD;  Location: [facility] INVASIVE CV LAB;  Service: Cardiovascular;  Laterality: N/A;   LUMBAR LAMINECTOMY/DECOMPRESSION MICRODISCECTOMY Bilateral 09/09/2018   Procedure: Laminectomy and Foraminotomy bilateral Lumbar three-Lumbar four - Lumbar four-Lumbar five;  Surgeon: Julio Sicks, MD;  Location: [facility] OR;  Service: Neurosurgery;   Laterality: Bilateral;   PARTIAL GASTRECTOMY N/A 08/13/2023   Procedure: PARTIAL GASTRECTOMY;  Surgeon: Almond Lint, MD;  Location: [facility] OR;  Service: General;  Laterality: N/A;   PATCH ANGIOPLASTY Right 12/06/2017   Procedure: PATCH ANGIOPLASTY RIGHT CAROTID ARTERY;  Surgeon: Nada Libman, MD;  Location: [facility] OR;  Service: Vascular;  Laterality: Right;   PERIPHERAL VASCULAR INTERVENTION Right 01/25/2017   Procedure: Peripheral Vascular Intervention;  Surgeon: Sherren Kerns, MD;  Location: [facility] INVASIVE CV LAB;  Service: Cardiovascular;  Laterality: Right;   PERIPHERAL VASCULAR INTERVENTION Right 07/03/2019   Procedure: PERIPHERAL VASCULAR INTERVENTION;  Surgeon: Sherren Kerns, MD;  Location: [facility] INVASIVE CV LAB;  Service: Cardiovascular;  Laterality: Right;   PORTACATH PLACEMENT Left 05/14/2023   Procedure: INSERTION PORT-A-CATH;  Surgeon: Almond Lint, MD;  Location: [facility] OR;  Service: General;  Laterality: Left;   TONSILLECTOMY     UPPER ESOPHAGEAL ENDOSCOPIC ULTRASOUND (EUS) Bilateral 05/01/2023   Procedure: UPPER ESOPHAGEAL ENDOSCOPIC ULTRASOUND (EUS);  Surgeon: Willis Modena, MD;  Location: [facility] ENDOSCOPY;  Service: Gastroenterology;  Laterality: Bilateral;   XI ROBOTIC ASSISTED LAPAROSCOPIC DISTAL PANCREATECTOMY N/A 08/13/2023   Procedure: HAND ASSISTED LAPAROSCOPIC SPLENECTOMY;  Surgeon: Almond Lint, MD;  Location: [facility] OR;  Service: General;  Laterality: N/A;    HEMATOLOGY/ONCOLOGY HISTORY:  Oncology History Overview Note   Cancer Staging  Pancreatic cancer [facility]) Staging form: Exocrine Pancreas, AJCC 8th Edition - Clinical stage from 05/01/2023: Stage III (cT3, cN2, cM0) - Signed by [name], MD on 05/03/2023     Pancreatic cancer (HCC)  05/01/2023 Cancer Staging   Staging form: Exocrine Pancreas, AJCC 8th Edition - Clinical stage from 05/01/2023: Stage III (cT3, cN2, cM0) - Signed by [name], MD on 05/03/2023   05/02/2023 Initial Diagnosis   Pancreatic cancer (HCC)    05/16/2023 - 06/28/2023 Chemotherapy   Patient is on Treatment Plan : PANCREAS Modified FOLFIRINOX q14d x 4 cycles     07/09/2023 - 07/09/2023 Chemotherapy   Patient is on Treatment Plan : PANCREATIC Abraxane D1,8,15 + Gemcitabine D1,8,15 q28d     08/13/2023 Surgery   Open distal sub total pancreatectomy, partial gastrectomy, and partial small bowel resection. Dr. [name] [facility]     ALLERGIES:  is allergic to augmentin [amoxicillin-pot clavulanate].  MEDICATIONS:  Current Outpatient Medications  Medication Sig Dispense Refill   acetaminophen (TYLENOL) 500 MG tablet Take 2 tablets (1,000 mg total) by mouth every 6 (six) hours. 30 tablet 0   apixaban (ELIQUIS) 5 MG TABS tablet Take 1 tablet (5 mg total) by mouth 2 (two) times daily. 60 tablet 5   aspirin EC 81 MG tablet Take 81 mg by mouth daily.     diazepam (VALIUM) 2 MG tablet Take 2 mg by mouth 2 (two) times daily as needed for anxiety.     diclofenac Sodium (VOLTAREN) 1 % GEL Apply 4 g topically 4 (four) times daily as needed (Up to 4x daily max).     esomeprazole (NEXIUM) 40 MG capsule Take 40 mg by mouth daily as needed (heartburn/hiccups).     fenofibrate (TRICOR) 48 MG tablet Take 48 mg by mouth daily.     gabapentin (NEURONTIN)  300 MG capsule Take 900 mg by mouth 2 (two) times daily.     Insulin Glargine (BASAGLAR KWIKPEN) 100 UNIT/ML Inject 8 Units into the skin daily. 15 mL 1   JARDIANCE 25 MG TABS tablet Take 25 mg by mouth daily.     megestrol (MEGACE) 40 MG/ML suspension Take 200 mg by mouth 2 (two) times daily.     metFORMIN (GLUCOPHAGE-XR) 500 MG 24 hr tablet Take 1,000 mg by mouth daily after supper.     methocarbamol (ROBAXIN) 750 MG tablet Take 1 tablet (750 mg total) by mouth every 8 (eight) hours as needed for muscle spasms. 30 tablet 2   morphine (MS CONTIN) 15 MG 12 hr tablet Take 1 tablet (15 mg total) by mouth every 12 (twelve) hours. 60 tablet 0   ondansetron (ZOFRAN) 4 MG tablet Take 4 mg by  mouth every 8 (eight) hours as needed for nausea or vomiting.     oxyCODONE (OXY IR/ROXICODONE) 5 MG immediate release tablet Take 1-2 tablets (5-10 mg total) by mouth every 4 (four) hours as needed for moderate pain (pain score 4-6) or breakthrough pain. 120 tablet 0   oxyCODONE-acetaminophen (PERCOCET/ROXICET) 5-325 MG tablet Take 1 tablet by mouth every 6 (six) hours as needed for severe pain (pain score 7-10). 40 tablet 0   polyethylene glycol (MIRALAX / GLYCOLAX) 17 g packet Take 17 g by mouth daily as needed for moderate constipation.     prochlorperazine (COMPAZINE) 10 MG tablet Take 1 tablet (10 mg total) by mouth every 6 (six) hours as needed for nausea or vomiting (Use for nausea and / or vomiting unresolved with ondansetron (Zofran).). 30 tablet 0   rosuvastatin (CRESTOR) 40 MG tablet Take 40 mg by mouth daily.     sildenafil (VIAGRA) 100 MG tablet Take 100 mg by mouth as needed for erectile dysfunction. (Patient not taking: Reported on 10/09/2023)     spironolactone (ALDACTONE) 25 MG tablet Take 25 mg by mouth daily.     No current facility-administered medications for this visit.    VITAL SIGNS: There were no vitals taken for this visit. There were no vitals filed for this visit.  Estimated body mass index is 17.89 kg/m as calculated from the following:   Height as of 08/13/23: 5\' 10"  (1.778 m).   Weight as of 10/22/23: 124 lb 11.2 oz (56.6 kg).  LABS: CBC:    Component Value Date/Time   WBC 10.7 (H) 10/22/2023 1122   WBC 9.8 08/18/2023 0546   HGB 13.3 10/22/2023 1122   HGB 16.6 10/23/2017 0933   HCT 40.9 10/22/2023 1122   HCT 48.6 10/23/2017 0933   PLT 612 (H) 10/22/2023 1122   PLT 338 10/23/2017 0933   MCV 84.7 10/22/2023 1122   MCV 87 10/23/2017 0933   NEUTROABS 6.3 10/22/2023 1122   NEUTROABS 4.1 10/23/2017 0933   LYMPHSABS 3.1 10/22/2023 1122   LYMPHSABS 2.6 10/23/2017 0933   MONOABS 1.1 (H) 10/22/2023 1122   EOSABS 0.1 10/22/2023 1122   EOSABS 0.1 10/23/2017  0933   BASOSABS 0.1 10/22/2023 1122   BASOSABS 0.0 10/23/2017 0933   Comprehensive Metabolic Panel:    Component Value Date/Time   NA 136 10/22/2023 1122   NA 140 01/17/2021 1402   K 4.3 10/22/2023 1122   CL 103 10/22/2023 1122   CO2 26 10/22/2023 1122   BUN 19 10/22/2023 1122   BUN 25 01/17/2021 1402   CREATININE 0.82 10/22/2023 1122   CREATININE 1.05 07/23/2016 0929  GLUCOSE 135 (H) 10/22/2023 1122   CALCIUM 9.3 10/22/2023 1122   AST 14 (L) 10/22/2023 1122   ALT 10 10/22/2023 1122   ALKPHOS 78 10/22/2023 1122   BILITOT 0.4 10/22/2023 1122   PROT 6.6 10/22/2023 1122   PROT 6.5 10/23/2017 0933   ALBUMIN 3.5 10/22/2023 1122   ALBUMIN 4.2 10/23/2017 0933    RADIOGRAPHIC STUDIES: CT CHEST ABDOMEN PELVIS W CONTRAST Addendum Date: 10/16/2023 ADDENDUM REPORT: 10/16/2023 18:52 CLINICAL DATA:  Assess treatment response pancreatic cancer. * Tracking Code: BO * EXAM: CT CHEST, ABDOMEN, AND PELVIS WITH CONTRAST TECHNIQUE: Multidetector CT imaging of the chest, abdomen and pelvis was performed following the standard protocol during bolus administration of intravenous contrast. RADIATION DOSE REDUCTION: This exam was performed according to the departmental dose-optimization program which includes automated exposure control, adjustment of the mA and/or kV according to patient size and/or use of iterative reconstruction technique. CONTRAST:  OMNIPAQUE IOHEXOL 300 MG/ML  SOLN COMPARISON:  Abdomen CT 07/04/2023. PET-CT 05/20/2023. Older exams as well FINDINGS: CT CHEST FINDINGS Cardiovascular: Left upper chest port in place. Tip seen as far as the central SVC above the right atrium. Coronary artery calcifications are seen. Heart is nonenlarged. Trace pericardial fluid. The thoracic aorta has a normal course and caliber with some atherosclerotic calcified plaque. Bovine type aortic arch. Mediastinum/Nodes: Slightly patulous thoracic esophagus. Previously there was hypermetabolic left-sided thyroid  nodule. Please correlate with the prior thyroid ultrasound from 05/28/2023. No specific abnormal lymph node enlargement seen in the axillary region, hilum or mediastinum. Lungs/Pleura: No consolidation, pneumothorax or effusion. No dominant lung mass. Musculoskeletal: Curvature of the spine with some degenerative changes. Multiple left lateral rib fractures identified. These are not seen on prior PET-CT. Please correlate with time course of injury. These include the lateral aspect of the left seventh, sixth and fifth and fourth ribs. Also some sclerosis of third rib. CT ABDOMEN PELVIS FINDINGS Hepatobiliary: Persistent moderate to severe biliary ductal dilatation diffusely. Previous cholecystectomy. No separate space-occupying liver lesion. Patent intra hepatic portal vein branches. There is severe narrowing of the very proximal main portal vein chest distal to the portal venous confluence. This is progressive from the prior examination. Pancreas: The prior examination demonstrated an infiltrative mass along the body/tail of the pancreas which was measured previously at 2.5 x 2.6 cm in the axial plane. There has been interval resection of the tail of the pancreas and spleen. In the area near the mass is now a complex fluid collection measuring 6.7 by 3.0 cm on series 7, image 114. This along a suture margin. Please see coronal series 11, image 31. This h abuts the posterior wall of the stomach. Adjacent to this area, medial and superior are infiltrative areas of nodular tissue which contributes to the worsening stenosis of the portal vein. This has areas of tissue for example on series 7, image 112 measures 7.3 x 2.8 cm. This extends along the course of the celiac, SMA and back towards the margin of the renal vein on the left. Additional smaller nodules more caudal in the mesentery such as series 7, image 123. These could be small nodes. There is ill-defined soft tissue adjacent to this area. Please see coronal  series 5, image 30, series 11, image 27 thru 29. The neck and head of the pancreas continues to show areas of atrophy. Increasing left upper quadrant collaterals. Spleen: Surgically absent Adrenals/Urinary Tract: Adrenal glands are preserved. Moderate atrophy of the left kidney greater than right. Tiny cystic foci seen,  Bosniak 1 and 2 lesions. Of note there is slightly less enhancement of the left kidney than right. There is significant calcified plaque along the course of the renal arteries but there is also narrowing along course of the splenic vein related to the abnormal soft tissue described above. Abnormal tissue in this location on image 115 of series 7 would measure 2.7 x 2.0 cm. Preserved contours of the urinary bladder. Stomach/Bowel: Moderate debris and fluid in the stomach. Small bowel is nondilated. Scattered stool in the colon. There is air, fluid and some areas of small bowel stool appearance. Vascular/Lymphatic: Extensive vascular calcifications identified along the aorta and branch vessels. Normal caliber aorta and IVC. Prominent retroperitoneal nodes identified. These include left para-aortic where prior lesion has a short axis of 7 mm and today 8 mm. Similar. Abnormal lymph nodes seen in the porta hepatis on series 7, image 121 today measures 15 x 23 mm. Previously 10 x 15 mm. Reproductive: Prostate is unremarkable. Other: Small amount of free fluid in the pelvis. Anasarca. Diffuse mesenteric haziness. Musculoskeletal: Degenerative changes seen along the spine and pelvis. IMPRESSION: Interval surgical changes distal pancreatectomy and splenectomy. In the surgical bed near the margin of the prostate is a complex fluid collection measured at 6.7 x 3.0 cm. This has along the suture margin. Extending away from this area more posterior and towards the midline is new abnormal infiltrative soft tissue with a encasement of the main portal vein with worsening severe stenosis. This also abnormal tissue  abutting the margin of the SMA and celiac. This is worrisome for local spread and worsening disease . The adjacent nodes in the retroperitoneum are similar to previous but there are some increasing central small nodes in the upper mesentery. Increasing nodes towards the porta hepatis. No developing new liver lesion. Persistent dilatation of the biliary tree. Bilateral renal atrophy, left-greater-than-right. There also slightly less enhancement of the left kidney compared to right. This has of uncertain etiology but could be vascular. The aforementioned lesion does extend back to involve the location of the left renal vein. Left-sided lateral rib fractures. Please correlate with the time course of injury. Electronically Signed   By: Karen Kays M.D.   On: 10/16/2023 18:52   Result Date: 10/16/2023 CLINICAL DATA:  Assess treatment response pancreatic cancer. * Tracking Code: BO * EXAM: CT CHEST, ABDOMEN, AND PELVIS WITH CONTRAST TECHNIQUE: Multidetector CT imaging of the chest, abdomen and pelvis was performed following the standard protocol during bolus administration of intravenous contrast. RADIATION DOSE REDUCTION: This exam was performed according to the departmental dose-optimization program which includes automated exposure control, adjustment of the mA and/or kV according to patient size and/or use of iterative reconstruction technique. CONTRAST:  OMNIPAQUE IOHEXOL 300 MG/ML  SOLN COMPARISON:  Abdomen CT 07/04/2023. PET-CT 05/20/2023. Older exams as well FINDINGS: CT CHEST FINDINGS Cardiovascular: Left upper chest port in place. Tip seen as far as the central SVC above the right atrium. Coronary artery calcifications are seen. Heart is nonenlarged. Trace pericardial fluid. The thoracic aorta has a normal course and caliber with some atherosclerotic calcified plaque. Bovine type aortic arch. Mediastinum/Nodes: Slightly patulous thoracic esophagus. Previously there was hypermetabolic left-sided thyroid  nodule. Please correlate with the prior thyroid ultrasound from 05/28/2023. No specific abnormal lymph node enlargement seen in the axillary region, hilum or mediastinum. Lungs/Pleura: No consolidation, pneumothorax or effusion. No dominant lung mass. Musculoskeletal: Curvature of the spine with some degenerative changes. Multiple left lateral rib fractures identified. These are not  seen on prior PET-CT. Please correlate with time course of injury. These include the lateral aspect of the left seventh, sixth and fifth and fourth ribs. Also some sclerosis of third rib. CT ABDOMEN PELVIS FINDINGS Hepatobiliary: Persistent moderate to severe biliary ductal dilatation diffusely. Previous cholecystectomy. No separate space-occupying liver lesion. Patent intra hepatic portal vein branches. There is severe narrowing of the very proximal main portal vein chest distal to the portal venous confluence. This has progressive from the prior examination. Pancreas: Prior examination demonstrated a infiltrative mass along the body/tail of the pancreas which was measured previously at 2.5 x 2.6 cm in the axial plane. There has been interval resection of the tail of the pancreas and spleen. In the area near the mass is now a complex fluid collection measuring 6.7 by 3.0 cm on series 7, image 114. Adjacent to this area, medial and superior is a infiltrative areas of nodular tissue which contributes to the worsening stenosis of the portal vein. This has areas of tissue for example on series 7, image 112 measures 7.3 x 2.8 cm. This extends along the course of the celiac. Additional smaller nodules more caudal in the mesentery such as series 7, image 123. These could be small nodes. There is ill-defined soft tissue adjacent to this area. The neck and head of the pancreas continues to show areas of atrophy. Spleen: Surgically absent Adrenals/Urinary Tract: Adrenal glands are preserved. Moderate atrophy of the left kidney greater than right.  Tiny cystic foci seen, Bosniak 1 and 2 lesions. Of note there is slightly less enhancement of the left kidney than right. There is significant calcified plaque along the course of the renal arteries but there is also narrowing along course of the splenic vein related to the abnormal soft tissue described above. Abnormal tissue in this location on image 115 of series 7 would measure 2.7 x 2.0 cm. Preserved contours of the urinary bladder. Stomach/Bowel: Moderate debris and fluid in the stomach. Small bowel is nondilated. Scattered stool in the colon. There is air, fluid and some areas of small bowel stool appearance. Vascular/Lymphatic: Extensive vascular calcifications identified along the aorta and branch vessels. Normal caliber aorta and IVC. Prominent retroperitoneal nodes identified. These include left para-aortic where prior lesion has a short axis of 7 mm and today 8 mm. Similar. Abnormal lymph nodes seen in the porta hepatis on series 7, image 121 today measures 15 x 23 mm. Previously 10 x 15 mm. Reproductive: Prostate is unremarkable. Other: Small amount of free fluid in the pelvis. Anasarca. Diffuse mesenteric haziness. Musculoskeletal: Degenerative changes seen along the spine and pelvis. IMPRESSION: Interval surgical changes distal pancreatectomy and splenectomy. In the surgical bed near the margin of the prostate is a complex fluid collection measured at 6.7 x 3.0 cm. This has along the suture margin. 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 This also abnormal tissue abutting the margin of the SMA and celiac. This has worrisome for potential worsening disease focally. The adjacent nodes in the retroperitoneum are similar to previous but there are some increasing central small nodes in the upper mesentery. Increasing nodes towards the porta hepatis. No developing new liver lesion.  Persistent dilatation of the biliary tree. Bilateral renal atrophy, left-greater-than-right. There also slightly less enhancement of the left kidney compared to right. This has of uncertain etiology but could be vascular. The aforementioned lesion does extend back  to involve the location of the left renal vein. Left-sided lateral rib fractures. Please correlate with the time course of injury. Electronically Signed: By: Karen Kays M.D. On: 10/16/2023 18:16    PERFORMANCE STATUS (ECOG) : {CHL ONC ECOG QQ:[id number]}  Review of Systems Unless otherwise noted, a complete review of systems is negative.  Physical Exam General: NAD Cardiovascular: regular rate and rhythm Pulmonary: clear ant fields Abdomen: soft, nontender, + bowel sounds Extremities: no edema, no joint deformities Skin: no rashes Neurological: Alert and oriented x3  IMPRESSION: *** I introduced myself, Maygan RN, and Palliative's role in collaboration with the oncology team. Concept of Palliative Care was introduced as specialized medical care for people and their families living with serious illness.  It focuses on providing relief from the symptoms and stress of a serious illness.  The goal is to improve quality of life for both the patient and the family. Values and goals of care important to patient and family were attempted to be elicited.    We discussed *** current illness and what it means in the larger context of *** on-going co-morbidities. Natural disease trajectory and expectations were discussed.  I discussed the importance of continued conversation with family and their medical providers regarding overall plan of care and treatment options, ensuring decisions are within the context of the patients values and GOCs.  PLAN: Established therapeutic relationship. Education provided on palliative's role in collaboration with their Oncology/Radiation team. I will plan to see patient back in 2-4 weeks in collaboration  to other oncology appointments.    Patient expressed understanding and was in agreement with this plan. He also understands that He can call the clinic at any time with any questions, concerns, or complaints.   Thank you for your referral and allowing Palliative to assist in Mr. Takashi Korol Blevins's care.   Number and complexity of problems addressed: ***HIGH - 1 or more chronic illnesses with SEVERE exacerbation, progression, or side effects of treatment - advanced cancer, pain. Any controlled substances utilized were prescribed in the context of palliative care.   Visit consisted of counseling and education dealing with the complex and emotionally intense issues of symptom management and palliative care in the setting of serious and potentially life-threatening illness.  Signed by: Willette Alma, AGPCNP-BC Palliative Medicine Team/[facility]

## 2023-10-28 ENCOUNTER — Encounter: Payer: Self-pay | Admitting: Nurse Practitioner

## 2023-10-28 ENCOUNTER — Inpatient Hospital Stay: Payer: Medicare Other | Attending: Hematology | Admitting: Nurse Practitioner

## 2023-10-28 VITALS — BP 134/83 | HR 103 | Temp 97.3°F | Resp 17 | Wt 125.5 lb

## 2023-10-28 DIAGNOSIS — Z7189 Other specified counseling: Secondary | ICD-10-CM | POA: Diagnosis not present

## 2023-10-28 DIAGNOSIS — Z515 Encounter for palliative care: Secondary | ICD-10-CM | POA: Diagnosis not present

## 2023-10-28 DIAGNOSIS — C252 Malignant neoplasm of tail of pancreas: Secondary | ICD-10-CM | POA: Insufficient documentation

## 2023-10-28 DIAGNOSIS — C787 Secondary malignant neoplasm of liver and intrahepatic bile duct: Secondary | ICD-10-CM | POA: Insufficient documentation

## 2023-10-28 DIAGNOSIS — R630 Anorexia: Secondary | ICD-10-CM

## 2023-10-28 DIAGNOSIS — R634 Abnormal weight loss: Secondary | ICD-10-CM

## 2023-10-28 DIAGNOSIS — K5903 Drug induced constipation: Secondary | ICD-10-CM

## 2023-10-28 DIAGNOSIS — Z5111 Encounter for antineoplastic chemotherapy: Secondary | ICD-10-CM | POA: Insufficient documentation

## 2023-10-28 DIAGNOSIS — Z79631 Long term (current) use of antimetabolite agent: Secondary | ICD-10-CM | POA: Insufficient documentation

## 2023-10-28 DIAGNOSIS — R530 Neoplastic (malignant) related fatigue: Secondary | ICD-10-CM

## 2023-10-28 DIAGNOSIS — G893 Neoplasm related pain (acute) (chronic): Secondary | ICD-10-CM

## 2023-10-29 ENCOUNTER — Encounter (HOSPITAL_COMMUNITY)
Admission: RE | Admit: 2023-10-29 | Discharge: 2023-10-29 | Disposition: A | Discharge status: Home / Self Care | Payer: Medicare Other | Source: Ambulatory Visit | Attending: Hematology | Admitting: Hematology

## 2023-10-29 ENCOUNTER — Other Ambulatory Visit: Payer: Self-pay | Admitting: Genetic Counselor

## 2023-10-29 DIAGNOSIS — C252 Malignant neoplasm of tail of pancreas: Secondary | ICD-10-CM | POA: Insufficient documentation

## 2023-10-29 LAB — GLUCOSE, CAPILLARY: Glucose-Capillary: 121 mg/dL — ABNORMAL HIGH (ref 70–99)

## 2023-10-29 MED ORDER — FLUDEOXYGLUCOSE F - 18 (FDG) INJECTION
6.3000 | Freq: Once | INTRAVENOUS | Status: AC
  Administered 2023-10-29: 6.25 via INTRAVENOUS

## 2023-10-30 ENCOUNTER — Telehealth: Payer: Self-pay

## 2023-10-30 NOTE — Telephone Encounter (Signed)
 Pt called requesting a telephone visit with Dr. Lanny.  Pt's daughter wants to speak with Dr. Lanny regarding pt's tx plan.  Pt's daughter lives in [place] .  Notified Dr. Lanny of the pt's request and sent a scheduling message to Dr. Demetra scheduler to get pt scheduled for a telephone visit.

## 2023-10-31 ENCOUNTER — Other Ambulatory Visit: Payer: Self-pay | Admitting: *Deleted

## 2023-10-31 ENCOUNTER — Other Ambulatory Visit: Payer: Self-pay

## 2023-10-31 MED ORDER — MORPHINE SULFATE ER 15 MG PO TBCR: 15.0000 mg | EXTENDED_RELEASE_TABLET | Freq: Two times a day (BID) | ORAL | 0 refills | Status: DC

## 2023-10-31 NOTE — Progress Notes (Signed)
 The proposed treatment discussed in conference is for discussion purpose only and is not a binding recommendation.  The patients have not been physically examined, or presented with their treatment options.  Therefore, final treatment plans cannot be decided.

## 2023-10-31 NOTE — Progress Notes (Signed)
 Molecular results faxed to Delbert Feathers at [facility]. [phone]. Confirmation was received. Signatera orders not placed. Will f/u

## 2023-10-31 NOTE — Telephone Encounter (Signed)
 Pt wife called reported their pharmacy was out of stock of MS Contin , routed to another pharmacy near pt, no further needs at this time.

## 2023-11-01 ENCOUNTER — Telehealth: Payer: Self-pay | Admitting: Hematology

## 2023-11-01 ENCOUNTER — Other Ambulatory Visit: Payer: Self-pay | Admitting: Nurse Practitioner

## 2023-11-01 MED ORDER — MORPHINE SULFATE ER 15 MG PO TBCR: 15.0000 mg | EXTENDED_RELEASE_TABLET | Freq: Two times a day (BID) | ORAL | 0 refills | Status: DC

## 2023-11-04 ENCOUNTER — Other Ambulatory Visit: Payer: Self-pay | Admitting: General Surgery

## 2023-11-04 DIAGNOSIS — Z800 Family history of malignant neoplasm of digestive organs: Secondary | ICD-10-CM

## 2023-11-04 NOTE — Assessment & Plan Note (Signed)
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  -baseline CA19.9 872 -EUS pancreatic mass biopsy confirmed adenocarcinoma. -He has seen surgeon Dr. Cherlynn Cornfield -We reviewed his case in tumor board, and recommended neoadjuvant chemotherapy.  Will repeat CT with pancreatic cancer protocol after neoadjuvant chemo. -Staging PET scan from 8/26 reviewed, no metastatic disease, there is some mild uptake in the periaortic lymph node, will monitor in the future scan.  I also discussed incidental finding of thyroid  nodule, I ordered thyroid  ultrasound for evaluation. -He started first cycle of chemotherapy FOLFIRINOX on May 16, 2023. He tolerated first cycle moderately well with fatigue, cold sensitivity and abdominal pain etc, discussed management of side effect. -Due to significant weight loss and fatigue after first cycle chemo, I reduced his oxaliplatin  and irinotecan  dose from cycle 2. -He is not tolerating chemo well, his CA19.9 has not changed much since he started chemo. I the option of changing treatment to gemcitabine  and abraxane.  -Restaging CT scan from July 04, 2023 showed partial response.   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  Unfortunately multiple liver nodule was noticed during the surgery, and all 3 liver biopsy showed metastatic disease.  Tumor superior mesenteric vein margin was positive, 1 out of 28 lymph nodes were positive. 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 This  also abnormal tissue abutting the margin of the SMA and celiac. This is highly concerning for local spread and worsening disease. This lesion is hypermetabolic on PET 10/29/2023 and no other distant mets on PET -I recommend chemo gemcitabine  and abraxane for recurrent disease, which is unfortunately not curable.

## 2023-11-05 ENCOUNTER — Inpatient Hospital Stay: Payer: Medicare Other | Admitting: Hematology

## 2023-11-05 ENCOUNTER — Encounter: Payer: Self-pay | Admitting: Hematology

## 2023-11-05 ENCOUNTER — Other Ambulatory Visit: Payer: Self-pay

## 2023-11-05 DIAGNOSIS — C252 Malignant neoplasm of tail of pancreas: Secondary | ICD-10-CM

## 2023-11-05 MED ORDER — OXYCODONE HCL 5 MG PO TABS
5.0000 mg | ORAL_TABLET | ORAL | 0 refills | Status: DC | PRN
Start: 2023-11-05 — End: 2023-11-05

## 2023-11-05 MED ORDER — OXYCODONE HCL 5 MG PO TABS
5.0000 mg | ORAL_TABLET | ORAL | 0 refills | Status: DC | PRN
Start: 2023-11-05 — End: 2023-11-14

## 2023-11-05 NOTE — Progress Notes (Signed)
DISCONTINUE ON PATHWAY REGIMEN - Pancreatic Adenocarcinoma     A cycle is every 14 days:     Irinotecan      Oxaliplatin      Leucovorin      Fluorouracil   **Always confirm dose/schedule in your pharmacy ordering system**  PRIOR TREATMENT: PANOS95: mFOLFIRINOX q14 Days x 4 Cycles  START ON PATHWAY REGIMEN - Pancreatic Adenocarcinoma     A cycle is every 28 days:     Gemcitabine   **Always confirm dose/schedule in your pharmacy ordering system**  Patient Characteristics: Locally Advanced, Anatomically Unresectable, M0, Second Line, MSS/pMMR or MSI Unknown, Fluoropyrimidine-Based Therapy  First Line Therapeutic Status: Locally Advanced, Anatomically Unresectable, M0 Line of Therapy: Second Line Microsatellite/Mismatch Repair Status: MSS/pMMR Intent of Therapy: Non-Curative / Palliative Intent, Discussed with Patient

## 2023-11-05 NOTE — Addendum Note (Signed)
Addended by: Glee Arvin on: 11/05/2023 11:29 AM   Modules accepted: Orders

## 2023-11-05 NOTE — Telephone Encounter (Signed)
Pt called for medication refill, see associated orders

## 2023-11-05 NOTE — Progress Notes (Signed)
[facility]   [phone] [phone]   Clinic Follow up Note   Patient Care Team: [name], MD as PCP - General [name], [name], MD as PCP - Cardiology (Cardiology) [name], MD as Consulting Physician (Hematology) Pickenpack-Cousar, Arty Baumgartner, NP as Nurse Practitioner [facility] Palliative Medicine) 11/05/2023  I connected with Scott [name] on 11/05/23 at  9:00 AM EST by telephone and verified that I am speaking with the correct person using two identifiers.   I discussed the limitations, risks, security and privacy concerns of performing an evaluation and management service by telephone and the availability of in person appointments. I also discussed with the patient that there may be a patient responsible charge related to this service. The patient expressed understanding and agreed to proceed.   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  -baseline CA19.9 872 -EUS pancreatic mass biopsy confirmed adenocarcinoma. -He has seen surgeon [name] -We reviewed his case in tumor board, and recommended neoadjuvant chemotherapy.  Will repeat CT with pancreatic cancer protocol after neoadjuvant chemo. -Staging PET scan from 8/26 reviewed, no metastatic disease, there is some mild uptake in the periaortic lymph node, will monitor in the future scan.  I also discussed incidental finding of thyroid nodule, I ordered thyroid ultrasound for evaluation. -He started first cycle of chemotherapy FOLFIRINOX on May 16, 2023. He tolerated first cycle moderately well with fatigue, cold sensitivity  and abdominal pain etc, discussed management of side effect. -Due to significant weight loss and fatigue after first cycle chemo, I reduced his oxaliplatin and irinotecan dose from cycle 2. -He is not tolerating chemo well, his CA19.9 has not changed much since he started chemo. I the option of changing treatment to gemcitabine and abraxane.  -Restaging CT scan from July 04, 2023 showed partial response.   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  Unfortunately multiple liver nodule was noticed during the surgery, and all 3 liver biopsy showed metastatic disease.  Tumor superior mesenteric vein margin was positive, 1 out of 28 lymph nodes were positive. 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 This also abnormal tissue abutting the margin of the SMA and celiac. This is highly concerning for local spread and worsening disease. This lesion is hypermetabolic on PET 10/29/2023 and no other distant mets on PET -I recommend chemo gemcitabine and abraxane for recurrent disease, which is unfortunately not curable.   Assessment and Plan    Pancreatic Cancer with Local Recurrence Confirmed by PET scan and elevated tumor markers, causing significant pain and impacting ability to eat solid foods. Not eligible for clinical trials due to performance status. Plan includes gemcitabine chemotherapy to manage disease and alleviate symptoms. Discussed risks and benefits: fatigue, low blood counts, nausea, diarrhea, mild hair loss. Patient and family exploring all treatment options, including clinical trials and alternative therapies.  Patient desires to prolong life for potential new  treatments. - Start gemcitabine chemotherapy, two weeks on, one week off - Consider adding a second drug if tolerated the first 2 cycles - Monitor blood counts weekly - Request insurance approval for treatment - Schedule first chemotherapy session for next week  Pain Management Significant pain due to local recurrence of pancreatic cancer. Scheduled for celiac nerve block, effective in ~50% of patients. Discussed risks: bleeding, infection. - Proceed with celiac nerve block - Continue current pain management with oxycodone - Contact [facility] for oxycodone prescription refill  Nutritional Support Struggling with eating solid foods, using liquid nutritional supplements. Needs to monitor sugar intake due to diabetes. Discussed increasing liquid supplements and monitoring sugar levels. - Increase liquid nutritional supplements while monitoring blood sugar - Consider alternative appetite stimulants if current medications are ineffective  Plan -Patient agrees to start chemotherapy with single agent next week, I offered to gemcitabine on day 1 and 8 every 21 days - Schedule follow-up appointment for next week - Monitor blood counts weekly during chemotherapy -He has been referred to IR for celiac nerve block -Pending foundation 1 results     SUMMARY OF ONCOLOGIC HISTORY: Oncology History Overview Note   Cancer Staging  Pancreatic cancer [facility] Staging form: Exocrine Pancreas, AJCC 8th Edition - Clinical stage from 05/01/2023: Stage III (cT3, cN2, cM0) - Signed by [name], MD on 05/03/2023     Pancreatic cancer (HCC)  05/01/2023 Cancer Staging   Staging form: Exocrine Pancreas, AJCC 8th Edition - Clinical stage from 05/01/2023: Stage III (cT3, cN2, cM0) - Signed by [name], MD on 05/03/2023   05/02/2023 Initial Diagnosis   Pancreatic cancer (HCC)   05/16/2023 - 06/28/2023 Chemotherapy   Patient is on Treatment Plan : PANCREAS Modified FOLFIRINOX q14d x 4 cycles     07/09/2023 - 07/09/2023  Chemotherapy   Patient is on Treatment Plan : PANCREATIC Abraxane D1,8,15 + Gemcitabine D1,8,15 q28d     08/13/2023 Surgery   Open distal sub total pancreatectomy, partial gastrectomy, and partial small bowel resection. Dr. [name] [facility]   11/12/2023 -  Chemotherapy   Patient is on Treatment Plan : PANCREAS Gemcitabine D1,8,15 (1000) q21d x 4 Cycles       Discussed the use of AI scribe software for clinical note transcription with the patient, who gave verbal consent to proceed.  History of Present Illness   A 73 year old individual with a history of pancreatic cancer presents with worsening abdominal and back pain, likely due to the recurrence of the disease. The patient reports difficulty eating, particularly solid foods, and a change in taste. Despite these challenges, the patient expresses a desire to prolong life and is open to exploring various treatment options. [name] family has been actively involved in researching potential treatments and clinical trials. The patient also reports struggling with maintaining a healthy appetite and has been relying on liquid nutritional supplements like Ensure and Boost. The patient is currently on pain management medication, including oxycodone, and has been prescribed nausea medication, including Zofran and Compazine.         REVIEW OF SYSTEMS:   Constitutional: Denies fevers, chills or abnormal weight loss Eyes: Denies blurriness of vision Ears, nose, mouth, throat, and face: Denies mucositis or sore throat Respiratory: Denies cough, dyspnea or wheezes Cardiovascular: Denies palpitation, chest discomfort or lower extremity swelling Gastrointestinal:  Denies nausea, heartburn or change in bowel habits Skin: Denies abnormal skin rashes Lymphatics: Denies new lymphadenopathy or easy bruising  Neurological:Denies numbness, tingling or new weaknesses Behavioral/Psych: Mood is stable, no new changes  All other systems were  reviewed with the patient and are negative.  MEDICAL HISTORY:  Past Medical History:  Diagnosis Date   Arthritis    Bilateral Knees/Fingers   CAD (coronary artery disease)    Cancer (HCC) 2024   Pancreatic Cancer   Cerebrovascular disease    Diabetes mellitus without complication (HCC)    Dysrhythmia    Hx. A.Fib. Been NSR since ablation   History of kidney stones    HLD (hyperlipidemia)    HTN (hypertension)    IBS (irritable bowel syndrome)    Patient states did not have   Lumbar stenosis    MI 1996   Persistent atrial fibrillation (HCC)    PVD (peripheral vascular disease) (HCC)    Stroke (cerebrum) (HCC)    Tobacco abuse     SURGICAL HISTORY: Past Surgical History:  Procedure Laterality Date   ABDOMINAL AORTOGRAM W/LOWER EXTREMITY N/A 01/25/2017   Procedure: Abdominal Aortogram w/Lower Extremity;  Surgeon: Sherren Kerns, MD;  Location: [facility] INVASIVE CV LAB;  Service: Cardiovascular;  Laterality: N/A;   ANTERIOR CRUCIATE LIGAMENT REPAIR     BOWEL RESECTION N/A 08/13/2023   Procedure: PARTIAL SMALL BOWEL RESECTION;  Surgeon: Almond Lint, MD;  Location: [facility] OR;  Service: General;  Laterality: N/A;   CARDIAC CATHETERIZATION     CONE   CARDIAC CATHETERIZATION N/A 07/27/2016   Procedure: Left Heart Cath and Coronary Angiography;  Surgeon: Tonny Bollman, MD;  Location: [facility] INVASIVE CV LAB;  Service: Cardiovascular;  Laterality: N/A;   CARDIOVERSION N/A 06/29/2016   Procedure: CARDIOVERSION;  Surgeon: Pricilla Riffle, MD;  Location: [facility] ENDOSCOPY;  Service: Cardiovascular;  Laterality: N/A;   CARDIOVERSION N/A 03/05/2018   Procedure: CARDIOVERSION;  Surgeon: Laurey Morale, MD;  Location: [facility] ENDOSCOPY;  Service: Cardiovascular;  Laterality: N/A;   CHOLECYSTECTOMY     ENDARTERECTOMY Right 12/06/2017   Procedure: ENDARTERECTOMY CAROTID RIGHT;  Surgeon: Nada Libman, MD;  Location: [facility] OR;  Service: Vascular;  Laterality: Right;   ESOPHAGOGASTRODUODENOSCOPY (EGD) WITH  PROPOFOL N/A 05/01/2023   Procedure: ESOPHAGOGASTRODUODENOSCOPY (EGD) WITH PROPOFOL;  Surgeon: Willis Modena, MD;  Location: [facility] ENDOSCOPY;  Service: Gastroenterology;  Laterality: N/A;   FINE NEEDLE ASPIRATION N/A 05/01/2023   Procedure: FINE NEEDLE ASPIRATION (FNA) LINEAR;  Surgeon: Willis Modena, MD;  Location: [facility] ENDOSCOPY;  Service: Gastroenterology;  Laterality: N/A;   KNEE SURGERY Bilateral 1982, 1988, 1997, 2000   x 4 times.   LAPAROSCOPY N/A 08/13/2023   Procedure: LAPAROSCOPY DIAGNOSTIC;  Surgeon: Almond Lint, MD;  Location: [facility] OR;  Service: General;  Laterality: N/A;   LOWER EXTREMITY ANGIOGRAPHY N/A 07/03/2019   Procedure: LOWER EXTREMITY ANGIOGRAPHY;  Surgeon: Sherren Kerns, MD;  Location: [facility] INVASIVE CV LAB;  Service: Cardiovascular;  Laterality: N/A;   LUMBAR LAMINECTOMY/DECOMPRESSION MICRODISCECTOMY Bilateral 09/09/2018   Procedure: Laminectomy and Foraminotomy bilateral Lumbar three-Lumbar four - Lumbar four-Lumbar five;  Surgeon: Julio Sicks, MD;  Location: [facility] OR;  Service: Neurosurgery;  Laterality: Bilateral;   PARTIAL GASTRECTOMY N/A 08/13/2023   Procedure: PARTIAL GASTRECTOMY;  Surgeon: Almond Lint, MD;  Location: [facility] OR;  Service: General;  Laterality: N/A;   PATCH ANGIOPLASTY Right 12/06/2017   Procedure: PATCH ANGIOPLASTY RIGHT CAROTID ARTERY;  Surgeon: Nada Libman, MD;  Location: [facility] OR;  Service: Vascular;  Laterality: Right;   PERIPHERAL VASCULAR INTERVENTION Right 01/25/2017   Procedure: Peripheral Vascular Intervention;  Surgeon: Sherren Kerns, MD;  Location: [facility]  INVASIVE CV LAB;  Service: Cardiovascular;  Laterality: Right;   PERIPHERAL VASCULAR INTERVENTION Right 07/03/2019   Procedure: PERIPHERAL VASCULAR INTERVENTION;  Surgeon: Sherren Kerns, MD;  Location: [facility] INVASIVE CV LAB;  Service: Cardiovascular;  Laterality: Right;   PORTACATH PLACEMENT Left 05/14/2023   Procedure: INSERTION PORT-A-CATH;  Surgeon: Almond Lint, MD;  Location: [facility] OR;   Service: General;  Laterality: Left;   TONSILLECTOMY     UPPER ESOPHAGEAL ENDOSCOPIC ULTRASOUND (EUS) Bilateral 05/01/2023   Procedure: UPPER ESOPHAGEAL ENDOSCOPIC ULTRASOUND (EUS);  Surgeon: Willis Modena, MD;  Location: [facility] ENDOSCOPY;  Service: Gastroenterology;  Laterality: Bilateral;   XI ROBOTIC ASSISTED LAPAROSCOPIC DISTAL PANCREATECTOMY N/A 08/13/2023   Procedure: HAND ASSISTED LAPAROSCOPIC SPLENECTOMY;  Surgeon: Almond Lint, MD;  Location: [facility] OR;  Service: General;  Laterality: N/A;    I have reviewed the social history and family history with the patient and they are unchanged from previous note.  ALLERGIES:  is allergic to augmentin [amoxicillin-pot clavulanate].  MEDICATIONS:  Current Outpatient Medications  Medication Sig Dispense Refill   acetaminophen (TYLENOL) 500 MG tablet Take 2 tablets (1,000 mg total) by mouth every 6 (six) hours. 30 tablet 0   apixaban (ELIQUIS) 5 MG TABS tablet Take 1 tablet (5 mg total) by mouth 2 (two) times daily. 60 tablet 5   aspirin EC 81 MG tablet Take 81 mg by mouth daily.     diazepam (VALIUM) 2 MG tablet Take 2 mg by mouth 2 (two) times daily as needed for anxiety.     diclofenac Sodium (VOLTAREN) 1 % GEL Apply 4 g topically 4 (four) times daily as needed (Up to 4x daily max).     esomeprazole (NEXIUM) 40 MG capsule Take 40 mg by mouth daily as needed (heartburn/hiccups).     fenofibrate (TRICOR) 48 MG tablet Take 48 mg by mouth daily.     gabapentin (NEURONTIN) 300 MG capsule Take 900 mg by mouth 2 (two) times daily.     Insulin Glargine (BASAGLAR KWIKPEN) 100 UNIT/ML Inject 8 Units into the skin daily. 15 mL 1   JARDIANCE 25 MG TABS tablet Take 25 mg by mouth daily.     megestrol (MEGACE) 40 MG/ML suspension Take 200 mg by mouth 2 (two) times daily.     metFORMIN (GLUCOPHAGE-XR) 500 MG 24 hr tablet Take 1,000 mg by mouth daily after supper.     methocarbamol (ROBAXIN) 750 MG tablet Take 1 tablet (750 mg total) by mouth every 8 (eight)  hours as needed for muscle spasms. 30 tablet 2   morphine (MS CONTIN) 15 MG 12 hr tablet Take 1 tablet (15 mg total) by mouth every 12 (twelve) hours. 60 tablet 0   ondansetron (ZOFRAN) 4 MG tablet Take 4 mg by mouth every 8 (eight) hours as needed for nausea or vomiting.     oxyCODONE (OXY IR/ROXICODONE) 5 MG immediate release tablet Take 1-2 tablets (5-10 mg total) by mouth every 4 (four) hours as needed for moderate pain (pain score 4-6) or breakthrough pain. 120 tablet 0   oxyCODONE-acetaminophen (PERCOCET/ROXICET) 5-325 MG tablet Take 1 tablet by mouth every 6 (six) hours as needed for severe pain (pain score 7-10). 40 tablet 0   polyethylene glycol (MIRALAX / GLYCOLAX) 17 g packet Take 17 g by mouth daily as needed for moderate constipation.     prochlorperazine (COMPAZINE) 10 MG tablet Take 1 tablet (10 mg total) by mouth every 6 (six) hours as needed for nausea or vomiting (Use for nausea and / or  vomiting unresolved with ondansetron (Zofran).). 30 tablet 0   rosuvastatin (CRESTOR) 40 MG tablet Take 40 mg by mouth daily.     sildenafil (VIAGRA) 100 MG tablet Take 100 mg by mouth as needed for erectile dysfunction. (Patient not taking: Reported on 10/09/2023)     spironolactone (ALDACTONE) 25 MG tablet Take 25 mg by mouth daily.     No current facility-administered medications for this visit.    PHYSICAL EXAMINATION: Not performed   LABORATORY DATA:  I have reviewed the data as listed    Latest Ref Rng & Units 10/22/2023   11:22 AM 09/10/2023   10:20 AM 09/03/2023    1:21 PM  CBC  WBC 4.0 - 10.5 K/uL 10.7  12.8  11.6   Hemoglobin 13.0 - 17.0 g/dL 16.1  09.6  04.5   Hematocrit 39.0 - 52.0 % 40.9  41.3  40.5   Platelets 150 - 400 K/uL 612  576  808         Latest Ref Rng & Units 10/22/2023   11:22 AM 10/09/2023   11:02 AM 09/10/2023   10:20 AM  CMP  Glucose 70 - 99 mg/dL 409  811  914   BUN 8 - 23 mg/dL 19  18  22    Creatinine 0.61 - 1.24 mg/dL 7.82  9.56  2.13   Sodium 135  - 145 mmol/L 136  134  137   Potassium 3.5 - 5.1 mmol/L 4.3  4.4  4.4   Chloride 98 - 111 mmol/L 103  100  105   CO2 22 - 32 mmol/L 26  26  23    Calcium 8.9 - 10.3 mg/dL 9.3  9.3  9.5   Total Protein 6.5 - 8.1 g/dL 6.6  6.4  6.8   Total Bilirubin 0.0 - 1.2 mg/dL 0.4  0.4  0.4   Alkaline Phos 38 - 126 U/L 78  94  81   AST 15 - 41 U/L 14  15  12    ALT 0 - 44 U/L 10  13  11        RADIOGRAPHIC STUDIES: I have personally reviewed the radiological images as listed and agreed with the findings in the report. No results found.     I discussed the assessment and treatment plan with the patient. The patient was provided an opportunity to ask questions and all were answered. The patient agreed with the plan and demonstrated an understanding of the instructions.   The patient was advised to call back or seek an in-person evaluation if the symptoms worsen or if the condition fails to improve as anticipated.  I provided 32 minutes of non face-to-face telephone visit time during this encounter, and > 50% was spent counseling as documented under my assessment & plan.     [name], MD 11/05/23

## 2023-11-05 NOTE — Telephone Encounter (Signed)
Requested medication was not in stock at preferred pharmacy, called to next preferred pharmacy and confirmed medication was in stock, verbalized agreement with pt, medication routed to new pharmacy.

## 2023-11-08 ENCOUNTER — Telehealth: Payer: Self-pay | Admitting: Hematology

## 2023-11-08 ENCOUNTER — Ambulatory Visit
Admission: RE | Admit: 2023-11-08 | Discharge: 2023-11-08 | Disposition: A | Discharge status: Home / Self Care | Payer: Medicare Other | Source: Ambulatory Visit | Attending: General Surgery | Admitting: General Surgery

## 2023-11-08 DIAGNOSIS — Z800 Family history of malignant neoplasm of digestive organs: Secondary | ICD-10-CM

## 2023-11-08 HISTORY — PX: IR RADIOLOGIST EVAL & MGMT: IMG5224

## 2023-11-08 NOTE — Consult Note (Signed)
Chief Complaint:  Abdominal pain  Referring Physician(s): Byerly,Faera  Oncology: Dr. Mosetta Putt  History of Present Illness: Scott [name] is a 73 y.o. male presenting as scheduled appointment to [facility], kindly referred to discuss his abdominal pain and possible celiac plexus block.   He is here today in the clinic with his wife to discuss.   History: Mr Scott [name]'s imaging diagnosis was made 04/11/23 on CT with history of pain and weight loss.  EUS pancreatic mass biopsy confirmed adenocarcinoma, and he was subsequently seen by [name].    On 08/13/2023 he underwent laparoscopy, splenectomy, distal subtotal pancreatectomy, partial gastrectomy, and partial small bowel resection.    His chemotherapy regimen with Dr. Mosetta Putt is currently a cycle every 14 days:  Irinotecan, Oxaliplatin, Leucovorin, Fluorouracil; based on notes from 2/11.    He tells me that his pain has been getting slightly worse over time, located in both the center back, and the epigastric region.  This is present every day.  He denies N/V.  Endorsed weight loss.  Has some constipation, likely related to pain medication.   Currently he is taking a base of Q12 hour morphine tablet, with break through pain trated with oxycodone.  He says he is also supplementing with fiber/senkot for constipation.   He also has dysgeusia, and tells me he does not enjoy food though has an ok appetite.   He is active during the day, but seems to be sleeping about 11 hours or so.      Past Medical History:  Diagnosis Date   Arthritis    Bilateral Knees/Fingers   CAD (coronary artery disease)    Cancer (HCC) 2024   Pancreatic Cancer   Cerebrovascular disease    Diabetes mellitus without complication (HCC)    Dysrhythmia    Hx. A.Fib. Been NSR since ablation   History of kidney stones    HLD (hyperlipidemia)    HTN (hypertension)    IBS (irritable bowel syndrome)    Patient states did not have   Lumbar stenosis    MI 1996    Persistent atrial fibrillation (HCC)    PVD (peripheral vascular disease) (HCC)    Stroke (cerebrum) (HCC)    Tobacco abuse     Past Surgical History:  Procedure Laterality Date   ABDOMINAL AORTOGRAM W/LOWER EXTREMITY N/A 01/25/2017   Procedure: Abdominal Aortogram w/Lower Extremity;  Surgeon: Sherren Kerns, MD;  Location: [facility]ASIVE [facility];  Service: Cardiovascular;  Laterality: N/A;   ANTERIOR CRUCIATE LIGAMENT REPAIR     BOWEL RESECTION N/A 08/13/2023   Procedure: PARTIAL SMALL BOWEL RESECTION;  Surgeon: Almond Lint, MD;  Location: [facility] OR;  Service: General;  Laterality: N/A;   CARDIAC CATHETERIZATION     CONE   CARDIAC CATHETERIZATION N/A 07/27/2016   Procedure: Left Heart Cath and Coronary Angiography;  Surgeon: Tonny Bollman, MD;  Location: [facility] INVASIVE CV LAB;  Service: Cardiovascular;  Laterality: N/A;   CARDIOVERSION N/A 06/29/2016   Procedure: CARDIOVERSION;  Surgeon: Pricilla Riffle, MD;  Location: [facility] ENDOSCOPY;  Service: Cardiovascular;  Laterality: N/A;   CARDIOVERSION N/A 03/05/2018   Procedure: CARDIOVERSION;  Surgeon: Laurey Morale, MD;  Location: [facility] ENDOSCOPY;  Service: Cardiovascular;  Laterality: N/A;   CHOLECYSTECTOMY     ENDARTERECTOMY Right 12/06/2017   Procedure: ENDARTERECTOMY CAROTID RIGHT;  Surgeon: Nada Libman, MD;  Location: [facility] OR;  Service: Vascular;  Laterality: Right;   ESOPHAGOGASTRODUODENOSCOPY (EGD) WITH PROPOFOL N/A 05/01/2023   Procedure: ESOPHAGOGASTRODUODENOSCOPY (EGD) WITH PROPOFOL;  Surgeon: Willis Modena, MD;  Location: [facility] ENDOSCOPY;  Service: Gastroenterology;  Laterality: N/A;   FINE NEEDLE ASPIRATION N/A 05/01/2023   Procedure: FINE NEEDLE ASPIRATION (FNA) LINEAR;  Surgeon: Willis Modena, MD;  Location: [facility] ENDOSCOPY;  Service: Gastroenterology;  Laterality: N/A;   KNEE SURGERY Bilateral 1982, 1988, 1997, 2000   x 4 times.   LAPAROSCOPY N/A 08/13/2023   Procedure: LAPAROSCOPY DIAGNOSTIC;  Surgeon: Almond Lint, MD;  Location:  [facility] OR;  Service: General;  Laterality: N/A;   LOWER EXTREMITY ANGIOGRAPHY N/A 07/03/2019   Procedure: LOWER EXTREMITY ANGIOGRAPHY;  Surgeon: Sherren Kerns, MD;  [facility]: [facility];  Service: Cardiovascular;  Laterality: N/A;   LUMBAR LAMINECTOMY/DECOMPRESSION MICRODISCECTOMY Bilateral 09/09/2018   Procedure: Laminectomy and Foraminotomy bilateral Lumbar three-Lumbar four - Lumbar four-Lumbar five;  Surgeon: Julio Sicks, MD;  Location: [facility] OR;  Service: Neurosurgery;  Laterality: Bilateral;   PARTIAL GASTRECTOMY N/A 08/13/2023   Procedure: PARTIAL GASTRECTOMY;  Surgeon: Almond Lint, MD;  Location: [facility] OR;  Service: General;  Laterality: N/A;   PATCH ANGIOPLASTY Right 12/06/2017   Procedure: PATCH ANGIOPLASTY RIGHT CAROTID ARTERY;  Surgeon: Nada Libman, MD;  Location: [facility] OR;  Service: Vascular;  Laterality: Right;   PERIPHERAL VASCULAR INTERVENTION Right 01/25/2017   Procedure: Peripheral Vascular Intervention;  Surgeon: Sherren Kerns, MD;  Location: [facility] INVASIVE CV LAB;  Service: Cardiovascular;  Laterality: Right;   PERIPHERAL VASCULAR INTERVENTION Right 07/03/2019   Procedure: PERIPHERAL VASCULAR INTERVENTION;  Surgeon: Sherren Kerns, MD;  [facility]: [facility];  Service: Cardiovascular;  Laterality: Right;   PORTACATH PLACEMENT Left 05/14/2023   Procedure: INSERTION PORT-A-CATH;  Surgeon: Almond Lint, MD;  Location: [facility] OR;  Service: General;  Laterality: Left;   TONSILLECTOMY     UPPER ESOPHAGEAL ENDOSCOPIC ULTRASOUND (EUS) Bilateral 05/01/2023   Procedure: UPPER ESOPHAGEAL ENDOSCOPIC ULTRASOUND (EUS);  Surgeon: Willis Modena, MD;  Location: [facility] ENDOSCOPY;  Service: Gastroenterology;  Laterality: Bilateral;   XI ROBOTIC ASSISTED LAPAROSCOPIC DISTAL PANCREATECTOMY N/A 08/13/2023   Procedure: HAND ASSISTED LAPAROSCOPIC SPLENECTOMY;  Surgeon: Almond Lint, MD;  Location: [facility] OR;  Service: General;  Laterality: N/A;    Allergies: Augmentin [amoxicillin-pot  clavulanate]  Medications: Prior to Admission medications   Medication Sig Start Date End Date Taking? Authorizing Provider  acetaminophen (TYLENOL) 500 MG tablet Take 2 tablets (1,000 mg total) by mouth every 6 (six) hours. 08/16/23   Almond Lint, MD  apixaban (ELIQUIS) 5 MG TABS tablet Take 1 tablet (5 mg total) by mouth 2 (two) times daily. 07/03/23   [name], [name], MD  aspirin EC 81 MG tablet Take 81 mg by mouth daily. 09/08/20   [provider]  diazepam (VALIUM) 2 MG tablet Take 2 mg by mouth 2 (two) times daily as needed for anxiety. 02/18/20   [provider]  diclofenac Sodium (VOLTAREN) 1 % GEL Apply 4 g topically 4 (four) times daily as needed (Up to 4x daily max). 08/16/23   Almond Lint, MD  esomeprazole (NEXIUM) 40 MG capsule Take 40 mg by mouth daily as needed (heartburn/hiccups).    [provider]  fenofibrate (TRICOR) 48 MG tablet Take 48 mg by mouth daily. 01/16/22   [provider]  gabapentin (NEURONTIN) 300 MG capsule Take 900 mg by mouth 2 (two) times daily. 02/03/20   [provider]  Insulin Glargine (BASAGLAR KWIKPEN) 100 UNIT/ML Inject 8 Units into the skin daily. 08/19/23   Kinsinger, De Blanch, MD  JARDIANCE 25 MG TABS tablet Take 25 mg  by mouth daily. 03/03/22   [provider]  megestrol (MEGACE) 40 MG/ML suspension Take 200 mg by mouth 2 (two) times daily. 08/30/23 08/29/24  [provider]  metFORMIN (GLUCOPHAGE-XR) 500 MG 24 hr tablet Take 1,000 mg by mouth daily after supper. 01/10/22   [provider]  methocarbamol (ROBAXIN) 750 MG tablet Take 1 tablet (750 mg total) by mouth every 8 (eight) hours as needed for muscle spasms. 08/16/23   Almond Lint, MD  morphine (MS CONTIN) 15 MG 12 hr tablet Take 1 tablet (15 mg total) by mouth every 12 (twelve) hours. 11/01/23   Pickenpack-Cousar, Arty Baumgartner, NP  ondansetron (ZOFRAN) 4 MG tablet Take 4 mg by mouth every 8 (eight) hours as needed for nausea  or vomiting.    [provider]  oxyCODONE (OXY IR/ROXICODONE) 5 MG immediate release tablet Take 1-2 tablets (5-10 mg total) by mouth every 4 (four) hours as needed for moderate pain (pain score 4-6) or breakthrough pain. 11/05/23   Pickenpack-Cousar, Arty Baumgartner, NP  polyethylene glycol (MIRALAX / GLYCOLAX) 17 g packet Take 17 g by mouth daily as needed for moderate constipation.    [provider]  prochlorperazine (COMPAZINE) 10 MG tablet Take 1 tablet (10 mg total) by mouth every 6 (six) hours as needed for nausea or vomiting (Use for nausea and / or vomiting unresolved with ondansetron (Zofran).). 08/16/23   Almond Lint, MD  rosuvastatin (CRESTOR) 40 MG tablet Take 40 mg by mouth daily.    [provider]  sildenafil (VIAGRA) 100 MG tablet Take 100 mg by mouth as needed for erectile dysfunction. Patient not taking: Reported on 10/09/2023 04/14/18   [provider]  spironolactone (ALDACTONE) 25 MG tablet Take 25 mg by mouth daily.    [provider]     Family History  Problem Relation Age of Onset   Heart disease Father    Melanoma Father    Testicular cancer Father    Hypertension Mother    Arthritis Mother    Arthritis Sister    Lung disease Neg Hx     Social History   Socioeconomic History   Marital status: Married    Spouse name: Lurena Joiner   Number of children: 2   Years of education: Not on file   Highest education level: Not on file  Occupational History   Occupation: retired  Tobacco Use   Smoking status: Every Day    Current packs/day: 0.00    Average packs/day: 1 pack/day for 50.0 years (50.0 ttl pk-yrs)    Types: Cigarettes    Start date: 06/02/1971    Last attempt to quit: 12/09/2017    Years since quitting: 5.9   Smokeless tobacco: Never   Tobacco comments:    Pt smokes one pack cigarettes per day  Vaping Use   Vaping status: Some Days   Substances: Nicotine  Substance and Sexual Activity   Alcohol use: Yes     Comment: Rarely   Drug use: No   Sexual activity: Not Currently  Other Topics Concern   Not on file  Social History Narrative   He is originally from [place]. He moved to [facility] in [place]. Previously has traveled to most of the [place]. Has traveled to [place] & [place]. Has been to [place], [place], & [place] 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 Has a dog currently. Remote bird exposure in 1980. No mold exposure.    Social Drivers of Corporate investment banker Strain: Not  on file  Food Insecurity: No Food Insecurity (08/13/2023)   Hunger Vital Sign    Worried About Running Out of Food in the Last Year: Never true    Ran Out of Food in the Last Year: Never true  Transportation Needs: No Transportation Needs (08/13/2023)   PRAPARE - Administrator, Civil Service (Medical): No    Lack of Transportation (Non-Medical): No  Physical Activity: Not on file  Stress: Not on file  Social Connections: Not on file    ECOG Status: 2 - Symptomatic, <50% confined to bed  Review of Systems: A 12 point ROS discussed and pertinent positives are indicated in the HPI above.  All other systems are negative.  Review of Systems  Vital Signs: BP 119/81   Pulse 90   Temp 98.3 F (36.8 C) (Oral)   Resp 19   SpO2 98%   Advance Care Plan: The advanced care plan/surrogate decision maker was discussed at the time of visit and documented in the medical record.    Physical Exam General: 73 yo male appearing stated age.  Frail, thin.  No distress. HEENT: Atraumatic, normocephalic.  Conjugate gaze, extra-ocular motor intact. No scleral icterus or scleral injection. No lesions on external ears, nose, lips, or gums.  Oral mucosa moist, pink.  Neck: Symmetric with no goiter enlargement.  Chest/Lungs:  Symmetric chest with inspiration/expiration.  No labored breathing.  Clear to auscultation with no wheezes, rhonchi, or rales.  Heart:  RRR, with no third heart sounds appreciated. No JVD  appreciated.  Abdomen:  Soft, scaphoid abdomen.  Well healed midline scare.  + bowel sounds. Slight TTP left abdomen.    Genito-urinary: Deferred Neurologic: Alert & Oriented to person, place, and time.   Normal affect and insight.  Appropriate questions.  Moving all 4 extremities with gross sensory intact.  Pulse Exam:  No bruit appreciated.  Palpable right and left radial pulse.    Skin: normal  Mallampati Score:     Imaging: NM PET Image Restage (PS) Skull Base to Thigh (F-18 FDG) Result Date: 10/29/2023 CLINICAL DATA:  Subsequent treatment strategy for pancreatic cancer. EXAM: NUCLEAR MEDICINE PET SKULL BASE TO THIGH TECHNIQUE: 6.25 mCi F-18 FDG was injected intravenously. Full-ring PET imaging was performed from the skull base to thigh after the radiotracer. CT data was obtained and used for attenuation correction and anatomic localization. Fasting blood glucose: 121 mg/dl COMPARISON:  Prior PET-CT 05/20/2023 and most recent CT scan 10/15/2023 FINDINGS: Mediastinal blood pool activity: SUV max 1.70 Liver activity: SUV max NA NECK: Persistent bilateral hypermetabolic parotid or periparotid lesions. The right-sided parotid gland lesion has an SUV max of 3.25 and is unchanged. The left parotid or periparotid nodule has an SUV max of 6.61 and was previously 5.66. Persistent hypermetabolic left thyroid nodule. This measures approximately 14 mm and SUV max is 6.11. This was previously 6.3. This has been evaluated on previous imaging. (ref: J Am Coll Radiol. 2015 Feb;12(2): 143-50). Incidental CT findings: Advanced bilateral carotid artery calcifications. CHEST: No hypermetabolic mediastinal or hilar nodes. No suspicious pulmonary nodules on the CT scan. Incidental CT findings: Stable advanced atherosclerotic calcifications involving the aorta, branch vessels and coronary arteries. ABDOMEN/PELVIS: The abnormal soft tissue lesion in the region of the pancreatic bed described on the prior abdominal CT scan  is hypermetabolic with SUV max of 9.03. This area measures approximately 4.0 x 4.0 cm and is highly worrisome for residual or recurrent tumor. No findings to suggest metastatic disease. No hypermetabolic  hepatic or adrenal gland lesions. There are several small mesenteric lymph nodes but no definite hypermetabolism. Incidental CT findings: Improved/largely resolved fluid collections in the lesser sac area likely resolving postoperative fluid collections. Stable advanced vascular disease. Persistent biliary dilatation. SKELETON: No findings suspicious for osseous metastatic disease. Incidental CT findings: None. IMPRESSION: 1. The abnormal soft tissue lesion in the region of the pancreatic bed described on the prior abdominal CT scan is hypermetabolic and highly worrisome for residual or recurrent tumor. 2. No findings for metastatic disease. 3. Persistent bilateral hypermetabolic parotid or periparotid lesions. 4. Persistent hypermetabolic left thyroid nodule. 5. Improved/largely resolved fluid collections in the lesser sac area likely resolving postoperative fluid collections. 6. Stable advanced vascular disease. 7. Aortic atherosclerosis. Electronically Signed   By: Rudie Meyer M.D.   On: 10/29/2023 17:33   CT CHEST ABDOMEN PELVIS W CONTRAST Addendum Date: 10/16/2023 ADDENDUM REPORT: 10/16/2023 18:52 CLINICAL DATA:  Assess treatment response pancreatic cancer. * Tracking Code: BO * EXAM: CT CHEST, ABDOMEN, AND PELVIS WITH CONTRAST TECHNIQUE: Multidetector CT imaging of the chest, abdomen and pelvis was performed following the standard protocol during bolus administration of intravenous contrast. RADIATION DOSE REDUCTION: This exam was performed according to the departmental dose-optimization program which includes automated exposure control, adjustment of the mA and/or kV according to patient size and/or use of iterative reconstruction technique. CONTRAST:  OMNIPAQUE IOHEXOL 300 MG/ML  SOLN COMPARISON:   Abdomen CT 07/04/2023. PET-CT 05/20/2023. Older exams as well FINDINGS: CT CHEST FINDINGS Cardiovascular: Left upper chest port in place. Tip seen as far as the central SVC above the right atrium. Coronary artery calcifications are seen. Heart is nonenlarged. Trace pericardial fluid. The thoracic aorta has a normal course and caliber with some atherosclerotic calcified plaque. Bovine type aortic arch. Mediastinum/Nodes: Slightly patulous thoracic esophagus. Previously there was hypermetabolic left-sided thyroid nodule. Please correlate with the prior thyroid ultrasound from 05/28/2023. No specific abnormal lymph node enlargement seen in the axillary region, hilum or mediastinum. Lungs/Pleura: No consolidation, pneumothorax or effusion. No dominant lung mass. Musculoskeletal: Curvature of the spine with some degenerative changes. Multiple left lateral rib fractures identified. These are not seen on prior PET-CT. Please correlate with time course of injury. These include the lateral aspect of the left seventh, sixth and fifth and fourth ribs. Also some sclerosis of third rib. CT ABDOMEN PELVIS FINDINGS Hepatobiliary: Persistent moderate to severe biliary ductal dilatation diffusely. Previous cholecystectomy. No separate space-occupying liver lesion. Patent intra hepatic portal vein branches. There is severe narrowing of the very proximal main portal vein chest distal to the portal venous confluence. This is progressive from the prior examination. Pancreas: The prior examination demonstrated an infiltrative mass along the body/tail of the pancreas which was measured previously at 2.5 x 2.6 cm in the axial plane. There has been interval resection of the tail of the pancreas and spleen. In the area near the mass is now a complex fluid collection measuring 6.7 by 3.0 cm on series 7, image 114. This along a suture margin. Please see coronal series 11, image 31. This h abuts the posterior wall of the stomach. Adjacent to  this area, medial and superior are infiltrative areas of nodular tissue which contributes to the worsening stenosis of the portal vein. This has areas of tissue for example on series 7, image 112 measures 7.3 x 2.8 cm. This extends along the course of the celiac, SMA and back towards the margin of the renal vein on the left. Additional smaller nodules more  caudal in the mesentery such as series 7, image 123. These could be small nodes. There is ill-defined soft tissue adjacent to this area. Please see coronal series 5, image 30, series 11, image 27 thru 29. The neck and head of the pancreas continues to show areas of atrophy. Increasing left upper quadrant collaterals. Spleen: Surgically absent Adrenals/Urinary Tract: Adrenal glands are preserved. Moderate atrophy of the left kidney greater than right. Tiny cystic foci seen, Bosniak 1 and 2 lesions. Of note there is slightly less enhancement of the left kidney than right. There is significant calcified plaque along the course of the renal arteries but there is also narrowing along course of the splenic vein related to the abnormal soft tissue described above. Abnormal tissue in this location on image 115 of series 7 would measure 2.7 x 2.0 cm. Preserved contours of the urinary bladder. Stomach/Bowel: Moderate debris and fluid in the stomach. Small bowel is nondilated. Scattered stool in the colon. There is air, fluid and some areas of small bowel stool appearance. Vascular/Lymphatic: Extensive vascular calcifications identified along the aorta and branch vessels. Normal caliber aorta and IVC. Prominent retroperitoneal nodes identified. These include left para-aortic where prior lesion has a short axis of 7 mm and today 8 mm. Similar. Abnormal lymph nodes seen in the porta hepatis on series 7, image 121 today measures 15 x 23 mm. Previously 10 x 15 mm. Reproductive: Prostate is unremarkable. Other: Small amount of free fluid in the pelvis. Anasarca. Diffuse  mesenteric haziness. Musculoskeletal: Degenerative changes seen along the spine and pelvis. IMPRESSION: Interval surgical changes distal pancreatectomy and splenectomy. In the surgical bed near the margin of the prostate is a complex fluid collection measured at 6.7 x 3.0 cm. This has along the suture margin. Extending away from this area more posterior and towards the midline is new abnormal infiltrative soft tissue with a encasement of the main portal vein with worsening severe stenosis. This also abnormal tissue abutting the margin of the SMA and celiac. This is worrisome for local spread and worsening disease . The adjacent nodes in the retroperitoneum are similar to previous but there are some increasing central small nodes in the upper mesentery. Increasing nodes towards the porta hepatis. No developing new liver lesion. Persistent dilatation of the biliary tree. Bilateral renal atrophy, left-greater-than-right. There also slightly less enhancement of the left kidney compared to right. This has of uncertain etiology but could be vascular. The aforementioned lesion does extend back to involve the location of the left renal vein. Left-sided lateral rib fractures. Please correlate with the time course of injury. Electronically Signed   By: Karen Kays M.D.   On: 10/16/2023 18:52   Result Date: 10/16/2023 CLINICAL DATA:  Assess treatment response pancreatic cancer. * Tracking Code: BO * EXAM: CT CHEST, ABDOMEN, AND PELVIS WITH CONTRAST TECHNIQUE: Multidetector CT imaging of the chest, abdomen and pelvis was performed following the standard protocol during bolus administration of intravenous contrast. RADIATION DOSE REDUCTION: This exam was performed according to the departmental dose-optimization program which includes automated exposure control, adjustment of the mA and/or kV according to patient size and/or use of iterative reconstruction technique. CONTRAST:  OMNIPAQUE IOHEXOL 300 MG/ML  SOLN  COMPARISON:  Abdomen CT 07/04/2023. PET-CT 05/20/2023. Older exams as well FINDINGS: CT CHEST FINDINGS Cardiovascular: Left upper chest port in place. Tip seen as far as the central SVC above the right atrium. Coronary artery calcifications are seen. Heart is nonenlarged. Trace pericardial fluid. The thoracic aorta has  a normal course and caliber with some atherosclerotic calcified plaque. Bovine type aortic arch. Mediastinum/Nodes: Slightly patulous thoracic esophagus. Previously there was hypermetabolic left-sided thyroid nodule. Please correlate with the prior thyroid ultrasound from 05/28/2023. No specific abnormal lymph node enlargement seen in the axillary region, hilum or mediastinum. Lungs/Pleura: No consolidation, pneumothorax or effusion. No dominant lung mass. Musculoskeletal: Curvature of the spine with some degenerative changes. Multiple left lateral rib fractures identified. These are not seen on prior PET-CT. Please correlate with time course of injury. These include the lateral aspect of the left seventh, sixth and fifth and fourth ribs. Also some sclerosis of third rib. CT ABDOMEN PELVIS FINDINGS Hepatobiliary: Persistent moderate to severe biliary ductal dilatation diffusely. Previous cholecystectomy. No separate space-occupying liver lesion. Patent intra hepatic portal vein branches. There is severe narrowing of the very proximal main portal vein chest distal to the portal venous confluence. This has progressive from the prior examination. Pancreas: Prior examination demonstrated a infiltrative mass along the body/tail of the pancreas which was measured previously at 2.5 x 2.6 cm in the axial plane. There has been interval resection of the tail of the pancreas and spleen. In the area near the mass is now a complex fluid collection measuring 6.7 by 3.0 cm on series 7, image 114. Adjacent to this area, medial and superior is a infiltrative areas of nodular tissue which contributes to the worsening  stenosis of the portal vein. This has areas of tissue for example on series 7, image 112 measures 7.3 x 2.8 cm. This extends along the course of the celiac. Additional smaller nodules more caudal in the mesentery such as series 7, image 123. These could be small nodes. There is ill-defined soft tissue adjacent to this area. The neck and head of the pancreas continues to show areas of atrophy. Spleen: Surgically absent Adrenals/Urinary Tract: Adrenal glands are preserved. Moderate atrophy of the left kidney greater than right. Tiny cystic foci seen, Bosniak 1 and 2 lesions. Of note there is slightly less enhancement of the left kidney than right. There is significant calcified plaque along the course of the renal arteries but there is also narrowing along course of the splenic vein related to the abnormal soft tissue described above. Abnormal tissue in this location on image 115 of series 7 would measure 2.7 x 2.0 cm. Preserved contours of the urinary bladder. Stomach/Bowel: Moderate debris and fluid in the stomach. Small bowel is nondilated. Scattered stool in the colon. There is air, fluid and some areas of small bowel stool appearance. Vascular/Lymphatic: Extensive vascular calcifications identified along the aorta and branch vessels. Normal caliber aorta and IVC. Prominent retroperitoneal nodes identified. These include left para-aortic where prior lesion has a short axis of 7 mm and today 8 mm. Similar. Abnormal lymph nodes seen in the porta hepatis on series 7, image 121 today measures 15 x 23 mm. Previously 10 x 15 mm. Reproductive: Prostate is unremarkable. Other: Small amount of free fluid in the pelvis. Anasarca. Diffuse mesenteric haziness. Musculoskeletal: Degenerative changes seen along the spine and pelvis. IMPRESSION: Interval surgical changes distal pancreatectomy and splenectomy. In the surgical bed near the margin of the prostate is a complex fluid collection measured at 6.7 x 3.0 cm. This has along  the suture margin. 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 This also abnormal tissue abutting the margin of the SMA and celiac. This  has worrisome for potential worsening disease focally. The adjacent nodes in the retroperitoneum are similar to previous but there are some increasing central small nodes in the upper mesentery. Increasing nodes towards the porta hepatis. No developing new liver lesion. Persistent dilatation of the biliary tree. Bilateral renal atrophy, left-greater-than-right. There also slightly less enhancement of the left kidney compared to right. This has of uncertain etiology but could be vascular. The aforementioned lesion does extend back to involve the location of the left renal vein. Left-sided lateral rib fractures. Please correlate with the time course of injury. Electronically Signed: By: Karen Kays M.D. On: 10/16/2023 18:16    Labs:  CBC: Recent Labs    08/27/23 1236 09/03/23 1321 09/10/23 1020 10/22/23 1122  WBC 10.8* 11.6* 12.8* 10.7*  HGB 14.1 13.9 14.0 13.3  HCT 42.4 40.5 41.3 40.9  PLT 1,000* 808* 576* 612*    COAGS: No results for input(s): "INR", "APTT" in the last 8760 hours.  BMP: Recent Labs    09/03/23 1321 09/10/23 1020 10/09/23 1102 10/22/23 1122  NA 135 137 134* 136  K 4.5 4.4 4.4 4.3  CL 100 105 100 103  CO2 24 23 26 26   GLUCOSE 286* 184* 162* 135*  BUN 32* 22 18 19   CALCIUM 9.6 9.5 9.3 9.3  CREATININE 1.16 0.88 0.91 0.82  GFRNONAA >60 >60 >60 >60    LIVER FUNCTION TESTS: Recent Labs    09/03/23 1321 09/10/23 1020 10/09/23 1102 10/22/23 1122  BILITOT 0.4 0.4 0.4 0.4  AST 13* 12* 15 14*  ALT 17 11 13 10   ALKPHOS 105 81 94 78  PROT 7.3 6.8 6.4* 6.6  ALBUMIN 3.7 3.8 3.5 3.5    TUMOR MARKERS: No results for input(s): "AFPTM", "CEA", "CA199", "CHROMGRNA" in the last 8760 hours.  Assessment and  Plan:  Mr Raczka is a very pleasant 73 yo male with history of pancreatic cancer, T3N0M0, stage IIA, SP partial pancreatectomy/splenectomy, undergoing chemotherapy with Dr. Mosetta Putt.    He has developed life-style limiting pain in the back and abdomen that is recalcitrant to medical therapy.    Today we discussed the concept of a nerve block, specifically celiac plexus block, for treatment in this scenario.  We briefly reviewed the relevant anatomy/pathology, and the logistics of the treatment.  Because we want to prove efficacy before a formal ETOH neurolysis, our plan will be to perform a test block with image guided local anesthesia/steroid before the ETOH.    I answered all of their questions about the procedure.   Specific risks include: bleeding, infection, injury to nearby structures, need for further procedure/surgery, need for hospitalization, diarrhea, orthostatic hypotension, cardiopulmonary collapse, death.    After discussion, they would like to proceed.    Intractable pain, invasive pancreatic cancer - plan for CT guided neurolysis, with preceding test block for efficacy  Plan: - Plan for CT guided nerve block with Dr. Loreta Ave.  Will plan for first procedure to be a test-block, and then after efficacy is proven, will schedule for neurolysis - continue current care  Thank you for this interesting consult.  I greatly enjoyed meeting CRUZE ZINGARO and look forward to participating in their care.  A copy of this report was sent to the requesting provider on this date.  Electronically Signed: Gilmer Mor 11/08/2023, 1:52 PM   I spent a total of  40 Minutes   in face to face in clinical consultation, greater than 50% of which was counseling/coordinating care for life style  limiting pain secondary to pancreatic cancer, CT guided celiac plexus nerve block

## 2023-11-08 NOTE — Telephone Encounter (Signed)
Called to cancel the appointments per the patients request via 2/12 secure chat. Left VM for the patient to call in if he has any additional questions or concerns.

## 2023-11-08 NOTE — Progress Notes (Deleted)
 Palliative Medicine [facility]  Telephone:(336) [phone] Fax:(336) [phone]   Name: Scott [name] Date: 11/08/2023 MRN: [id number]  DOB: 09-29-1950  Patient Care Team: [name], MD as PCP - General [name], [name], MD as PCP - Cardiology (Cardiology) [name], MD as Consulting Physician (Hematology) Pickenpack-Cousar, Arty Baumgartner, NP as Nurse Practitioner [facility] and Palliative Medicine)    INTERVAL HISTORY: Scott [name] is a 73 y.o. male with with oncologic medical history including pancreatic cancer (04/2023) as well as a-fib, HTN, HLD, arthritis, type 2 DM, and arthritis.  Palliative ask to see for symptom management and goals of care.   SOCIAL HISTORY:     reports that he has been smoking cigarettes. He started smoking about 52 years ago. He has a 50 pack-year smoking history. He has never used smokeless tobacco. He reports current alcohol use. He reports that he does not use drugs.  ADVANCE DIRECTIVES:  None on file  CODE STATUS: Full code  PAST MEDICAL HISTORY: Past Medical History:  Diagnosis Date   Arthritis    Bilateral Knees/Fingers   CAD (coronary artery disease)    Cancer (HCC) 2024   Pancreatic Cancer   Cerebrovascular disease    Diabetes mellitus without complication (HCC)    Dysrhythmia    Hx. A.Fib. Been NSR since ablation   History of kidney stones    HLD (hyperlipidemia)    HTN (hypertension)    IBS (irritable bowel syndrome)    Patient states did not have   Lumbar stenosis    MI 1996   Persistent atrial fibrillation (HCC)    PVD (peripheral vascular disease) (HCC)    Stroke (cerebrum) (HCC)    Tobacco abuse     ALLERGIES:  is allergic to augmentin [amoxicillin-pot clavulanate].  MEDICATIONS:  Current Outpatient Medications  Medication Sig Dispense Refill   acetaminophen (TYLENOL) 500 MG tablet Take 2 tablets (1,000 mg total) by mouth every 6 (six) hours. 30 tablet 0   apixaban (ELIQUIS) 5 MG TABS tablet  Take 1 tablet (5 mg total) by mouth 2 (two) times daily. 60 tablet 5   aspirin EC 81 MG tablet Take 81 mg by mouth daily.     diazepam (VALIUM) 2 MG tablet Take 2 mg by mouth 2 (two) times daily as needed for anxiety.     diclofenac Sodium (VOLTAREN) 1 % GEL Apply 4 g topically 4 (four) times daily as needed (Up to 4x daily max).     esomeprazole (NEXIUM) 40 MG capsule Take 40 mg by mouth daily as needed (heartburn/hiccups).     fenofibrate (TRICOR) 48 MG tablet Take 48 mg by mouth daily.     gabapentin (NEURONTIN) 300 MG capsule Take 900 mg by mouth 2 (two) times daily.     Insulin Glargine (BASAGLAR KWIKPEN) 100 UNIT/ML Inject 8 Units into the skin daily. 15 mL 1   JARDIANCE 25 MG TABS tablet Take 25 mg by mouth daily.     megestrol (MEGACE) 40 MG/ML suspension Take 200 mg by mouth 2 (two) times daily.     metFORMIN (GLUCOPHAGE-XR) 500 MG 24 hr tablet Take 1,000 mg by mouth daily after supper.     methocarbamol (ROBAXIN) 750 MG tablet Take 1 tablet (750 mg total) by mouth every 8 (eight) hours as needed for muscle spasms. 30 tablet 2   morphine (MS CONTIN) 15 MG 12 hr tablet Take 1 tablet (15 mg total) by mouth every 12 (twelve) hours. 60 tablet 0  ondansetron (ZOFRAN) 4 MG tablet Take 4 mg by mouth every 8 (eight) hours as needed for nausea or vomiting.     oxyCODONE (OXY IR/ROXICODONE) 5 MG immediate release tablet Take 1-2 tablets (5-10 mg total) by mouth every 4 (four) hours as needed for moderate pain (pain score 4-6) or breakthrough pain. 120 tablet 0   polyethylene glycol (MIRALAX / GLYCOLAX) 17 g packet Take 17 g by mouth daily as needed for moderate constipation.     prochlorperazine (COMPAZINE) 10 MG tablet Take 1 tablet (10 mg total) by mouth every 6 (six) hours as needed for nausea or vomiting (Use for nausea and / or vomiting unresolved with ondansetron (Zofran).). 30 tablet 0   rosuvastatin (CRESTOR) 40 MG tablet Take 40 mg by mouth daily.     sildenafil (VIAGRA) 100 MG tablet Take  100 mg by mouth as needed for erectile dysfunction. (Patient not taking: Reported on 10/09/2023)     spironolactone (ALDACTONE) 25 MG tablet Take 25 mg by mouth daily.     No current facility-administered medications for this visit.    VITAL SIGNS: There were no vitals taken for this visit. There were no vitals filed for this visit.  Estimated body mass index is 18.01 kg/m as calculated from the following:   Height as of 08/13/23: 5\' 10"  (1.778 m).   Weight as of 10/28/23: 125 lb 8 oz (56.9 kg).   PERFORMANCE STATUS (ECOG) : 1 - Symptomatic but completely ambulatory   Physical Exam General: NAD Cardiovascular: regular rate and rhythm Pulmonary: clear ant fields Abdomen: soft, nontender, + bowel sounds Extremities: no edema, no joint deformities Skin: no rashes Neurological:   IMPRESSION:   We discussed Her current illness and what it means in the larger context of Her on-going co-morbidities. Natural disease trajectory and expectations were discussed.  I discussed the importance of continued conversation with family and their medical providers regarding overall plan of care and treatment options, ensuring decisions are within the context of the patients values and GOCs.  PLAN: Established therapeutic relationship. Education provided on palliative's role in collaboration with their Oncology/Radiation team.  Cancer-related Pain Severe abdominal and back pain, managed with MS Contin (morphine) twice daily and oxycodone 5mg  for breakthrough pain. Patient also on gabapentin for nerve pain related to previous back surgery. -Continue MS Contin 15 mg twice daily and oxycodone 5-10 mg as needed for breakthrough pain. -Increase gabapentin 900 mg 3 times daily  -Patient aware to contact office 3 days 4 days prior to the need for refills.  Constipation Likely secondary to opioid use, intermittent use of stool softeners and laxatives. -Advise regular use of stool softeners and laxatives  (Miralax twice daily or Miralax in the morning and Senokot 1-2 tablets at bedtime) to prevent constipation.  Weight Loss Significant weight loss due to decreased appetite and taste changes post-chemotherapy. -Encourage nutritional support and regular meals as tolerated. -Education provided on use of ginger and/or lemon to help stimulate taste buds.  Smoking Current smoker. -Encourage smoking cessation for overall health improvement.  Follow-up Scheduled for 11/12/2023. -Plan to reassess pain management and overall condition at next visit.  Patient expressed understanding and was in agreement with this plan. He also understands that He can call the clinic at any time with any questions, concerns, or complaints.   Any controlled substances utilized were prescribed in the context of palliative care. PDMP has been reviewed.    Visit consisted of counseling and education dealing with the complex and emotionally intense  issues of symptom management and palliative care in the setting of serious and potentially life-threatening illness.  Willette Alma, AGPCNP-BC  Palliative Medicine Team/[facility]

## 2023-11-10 ENCOUNTER — Emergency Department (HOSPITAL_COMMUNITY): Payer: Medicare Other

## 2023-11-10 ENCOUNTER — Encounter (HOSPITAL_COMMUNITY): Payer: Self-pay

## 2023-11-10 ENCOUNTER — Emergency Department (HOSPITAL_COMMUNITY)
Admission: EM | Admit: 2023-11-10 | Discharge: 2023-11-10 | Disposition: A | Discharge status: Home / Self Care | Payer: Medicare Other | Attending: Emergency Medicine | Admitting: Emergency Medicine

## 2023-11-10 DIAGNOSIS — C787 Secondary malignant neoplasm of liver and intrahepatic bile duct: Secondary | ICD-10-CM | POA: Diagnosis not present

## 2023-11-10 DIAGNOSIS — R42 Dizziness and giddiness: Secondary | ICD-10-CM | POA: Insufficient documentation

## 2023-11-10 DIAGNOSIS — R4182 Altered mental status, unspecified: Secondary | ICD-10-CM | POA: Diagnosis not present

## 2023-11-10 DIAGNOSIS — C259 Malignant neoplasm of pancreas, unspecified: Secondary | ICD-10-CM

## 2023-11-10 DIAGNOSIS — Z7982 Long term (current) use of aspirin: Secondary | ICD-10-CM | POA: Insufficient documentation

## 2023-11-10 DIAGNOSIS — I6782 Cerebral ischemia: Secondary | ICD-10-CM | POA: Insufficient documentation

## 2023-11-10 DIAGNOSIS — Z8507 Personal history of malignant neoplasm of pancreas: Secondary | ICD-10-CM | POA: Diagnosis not present

## 2023-11-10 LAB — BASIC METABOLIC PANEL
Anion gap: 9 (ref 5–15)
BUN: 17 mg/dL (ref 8–23)
CO2: 22 mmol/L (ref 22–32)
Calcium: 8.7 mg/dL — ABNORMAL LOW (ref 8.9–10.3)
Chloride: 104 mmol/L (ref 98–111)
Creatinine, Ser: 0.77 mg/dL (ref 0.61–1.24)
GFR, Estimated: >60 mL/min (ref >60)
Glucose, Bld: 195 mg/dL — ABNORMAL HIGH (ref 70–99)
Potassium: 3.9 mmol/L (ref 3.5–5.1)
Sodium: 135 mmol/L (ref 135–145)

## 2023-11-10 LAB — URINALYSIS, ROUTINE W REFLEX MICROSCOPIC
Bacteria, UA: NONE SEEN
Bilirubin Urine: NEGATIVE
Glucose, UA: >=500 mg/dL — AB
Hgb urine dipstick: NEGATIVE
Ketones, ur: NEGATIVE mg/dL
Leukocytes,Ua: NEGATIVE
Nitrite: NEGATIVE
Protein, ur: 100 mg/dL — AB
Specific Gravity, Urine: 1.032 — ABNORMAL HIGH (ref 1.005–1.030)
pH: 6 (ref 5.0–8.0)

## 2023-11-10 LAB — HEPATIC FUNCTION PANEL
ALT: 27 U/L (ref 0–44)
AST: 20 U/L (ref 15–41)
Albumin: 3.2 g/dL — ABNORMAL LOW (ref 3.5–5.0)
Alkaline Phosphatase: 110 U/L (ref 38–126)
Bilirubin, Direct: <0.1 mg/dL (ref 0.0–0.2)
Total Bilirubin: 0.6 mg/dL (ref 0.0–1.2)
Total Protein: 6.1 g/dL — ABNORMAL LOW (ref 6.5–8.1)

## 2023-11-10 LAB — CBC
HCT: 40.1 % (ref 39.0–52.0)
Hemoglobin: 13.1 g/dL (ref 13.0–17.0)
MCH: 27.5 pg (ref 26.0–34.0)
MCHC: 32.7 g/dL (ref 30.0–36.0)
MCV: 84.1 fL (ref 80.0–100.0)
Platelets: 523 10*3/uL — ABNORMAL HIGH (ref 150–400)
RBC: 4.77 MIL/uL (ref 4.22–5.81)
RDW: 18.7 % — ABNORMAL HIGH (ref 11.5–15.5)
WBC: 10.1 10*3/uL (ref 4.0–10.5)
nRBC: 0 % (ref 0.0–0.2)

## 2023-11-10 LAB — LIPASE, BLOOD: Lipase: 20 U/L (ref 11–51)

## 2023-11-10 MED ORDER — MECLIZINE HCL 25 MG PO TABS: 25.0000 mg | ORAL_TABLET | Freq: Three times a day (TID) | ORAL | 0 refills | Status: DC | PRN

## 2023-11-10 MED ORDER — MECLIZINE HCL 25 MG PO TABS
25.0000 mg | ORAL_TABLET | Freq: Once | ORAL | Status: AC
  Administered 2023-11-10: 25 mg via ORAL
  Filled 2023-11-10: qty 1

## 2023-11-10 MED ORDER — SODIUM CHLORIDE 0.9 % IV BOLUS
500.0000 mL | Freq: Once | INTRAVENOUS | Status: AC
  Administered 2023-11-10: 500 mL via INTRAVENOUS

## 2023-11-10 MED ORDER — SODIUM CHLORIDE 0.9 % IV SOLN: INTRAVENOUS | Status: DC

## 2023-11-10 NOTE — Discharge Instructions (Addendum)
MRI without any acute findings.  You have had evidence of some small infarcts in the past.  But nothing acute today.  No definitive evidence of any metastasis to the brain.  Continue with the Antivert.  Make an appointment follow-up with your doctor as well as your oncologist.  Would recommend taking the Antivert every 8 hours for the next several days.  Be careful at home use your walker.  Probably need to stay in bed or on the couch is much as possible.

## 2023-11-10 NOTE — ED Notes (Signed)
Pt family informed not to touch pump. But to press call light or get nurses attention if they need help with the pump.

## 2023-11-10 NOTE — ED Notes (Signed)
 Patient transported to MRI

## 2023-11-10 NOTE — ED Notes (Signed)
Light green   lavender  gold and blue top was sent down to the lab

## 2023-11-10 NOTE — ED Provider Notes (Addendum)
[place] EMERGENCY DEPARTMENT AT [facility] Provider Note   CSN: [id number] Arrival date & time: 11/10/23  1253     History  Chief Complaint  Patient presents with   Dizziness    Scott [name] is a 73 y.o. male.  Patient brought in by EMS.  Patient has a history of pancreatic cancer metastasized to the liver area.  Receiving palliative care.  He has baseline chronic abdominal pain but basically started with room spinning and significant dizziness here today.  Upon awakening.  Never had that before.  Did fall over at home but did not hurt anything in the process.  Patient's appetite has not been great but that is baseline.  Patient currently not receiving any chemotherapy.  Patient last saw hematology oncology on February 11.  Patient with formal diagnosis of pancreatic cancer over the summer.  Had his staging PET scans August 26.  No evidence of metastatic disease on that.  He went through chemotherapy several cycles had improvement with the pancreatic tumors.  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  Plan currently was restarting chemo with 2 agents.  Patient is also followed at [facility].  [facility] recommended may be just trying 1 of those chemo agents.  He was post to start that next week.  At rest patient currently not having any room spinning or feeling dizzy.       Home Medications Prior to Admission medications   Medication Sig Start Date End Date Taking? Authorizing Provider  acetaminophen (TYLENOL) 500 MG tablet Take 2 tablets (1,000 mg total) by mouth every 6 (six) hours. 08/16/23   Almond Lint, MD  apixaban (ELIQUIS) 5 MG TABS tablet Take 1 tablet (5 mg total) by mouth 2 (two) times daily. 07/03/23   [name], [name], MD  aspirin EC 81 MG tablet Take 81 mg by mouth daily. 09/08/20   [provider]  diazepam  (VALIUM) 2 MG tablet Take 2 mg by mouth 2 (two) times daily as needed for anxiety. 02/18/20   [provider]  diclofenac Sodium (VOLTAREN) 1 % GEL Apply 4 g topically 4 (four) times daily as needed (Up to 4x daily max). 08/16/23   Almond Lint, MD  esomeprazole (NEXIUM) 40 MG capsule Take 40 mg by mouth daily as needed (heartburn/hiccups).    [provider]  fenofibrate (TRICOR) 48 MG tablet Take 48 mg by mouth daily. 01/16/22   [provider]  gabapentin (NEURONTIN) 300 MG capsule Take 900 mg by mouth 2 (two) times daily. 02/03/20   [provider]  Insulin Glargine (BASAGLAR KWIKPEN) 100 UNIT/ML Inject 8 Units into the skin daily. 08/19/23   Kinsinger, De Blanch, MD  JARDIANCE 25 MG TABS tablet Take 25 mg by mouth daily. 03/03/22   [provider]  megestrol (MEGACE) 40 MG/ML suspension Take 200 mg by mouth 2 (two) times daily. 08/30/23 08/29/24  [provider]  metFORMIN (GLUCOPHAGE-XR) 500 MG 24 hr tablet Take 1,000 mg by mouth daily after supper. 01/10/22   [provider]  methocarbamol (ROBAXIN) 750 MG tablet Take 1 tablet (750 mg total) by mouth every 8 (eight) hours as needed for muscle spasms. 08/16/23   Almond Lint, MD  morphine (MS CONTIN) 15 MG 12 hr tablet Take 1 tablet (15 mg total) by mouth every 12 (twelve) hours. 11/01/23   Pickenpack-Cousar, Arty Baumgartner, NP  ondansetron (  ZOFRAN) 4 MG tablet Take 4 mg by mouth every 8 (eight) hours as needed for nausea or vomiting.    [provider]  oxyCODONE (OXY IR/ROXICODONE) 5 MG immediate release tablet Take 1-2 tablets (5-10 mg total) by mouth every 4 (four) hours as needed for moderate pain (pain score 4-6) or breakthrough pain. 11/05/23   Pickenpack-Cousar, Arty Baumgartner, NP  polyethylene glycol (MIRALAX / GLYCOLAX) 17 g packet Take 17 g by mouth daily as needed for moderate constipation.    [provider]  prochlorperazine (COMPAZINE) 10 MG tablet Take 1 tablet (10 mg  total) by mouth every 6 (six) hours as needed for nausea or vomiting (Use for nausea and / or vomiting unresolved with ondansetron (Zofran).). 08/16/23   Almond Lint, MD  rosuvastatin (CRESTOR) 40 MG tablet Take 40 mg by mouth daily.    [provider]  sildenafil (VIAGRA) 100 MG tablet Take 100 mg by mouth as needed for erectile dysfunction. Patient not taking: Reported on 10/09/2023 04/14/18   [provider]  spironolactone (ALDACTONE) 25 MG tablet Take 25 mg by mouth daily.    [provider]      Allergies    Augmentin [amoxicillin-pot clavulanate]    Review of Systems   Review of Systems  Constitutional:  Negative for chills and fever.  HENT:  Negative for ear pain and sore throat.   Eyes:  Negative for pain and visual disturbance.  Respiratory:  Negative for cough and shortness of breath.   Cardiovascular:  Negative for chest pain and palpitations.  Gastrointestinal:  Negative for abdominal pain and vomiting.  Genitourinary:  Negative for dysuria and hematuria.  Musculoskeletal:  Negative for arthralgias and back pain.  Skin:  Negative for color change and rash.  Neurological:  Positive for dizziness. Negative for seizures and syncope.  All other systems reviewed and are negative.   Physical Exam Updated Vital Signs BP 139/61 (BP Location: Left Arm)   Pulse (!) 53   Temp 98.4 F (36.9 C) (Oral)   Resp 16   Ht 1.778 m (5\' 10" )   Wt 56.7 kg   SpO2 100%   BMI 17.94 kg/m  Physical Exam Vitals and nursing note reviewed.  Constitutional:      General: He is not in acute distress.    Appearance: Normal appearance. He is well-developed. He is ill-appearing.     Comments: Patient very thin.  HENT:     Head: Normocephalic and atraumatic.  Eyes:     Extraocular Movements: Extraocular movements intact.     Conjunctiva/sclera: Conjunctivae normal.     Pupils: Pupils are equal, round, and reactive to light.  Cardiovascular:     Rate and Rhythm:  Normal rate and regular rhythm.     Heart sounds: No murmur heard. Pulmonary:     Effort: Pulmonary effort is normal. No respiratory distress.     Breath sounds: Normal breath sounds.  Abdominal:     Palpations: Abdomen is soft.     Tenderness: There is no abdominal tenderness.  Musculoskeletal:        General: No swelling.     Cervical back: Normal range of motion and neck supple. No rigidity.  Skin:    General: Skin is warm and dry.     Capillary Refill: Capillary refill takes less than 2 seconds.  Neurological:     General: No focal deficit present.     Mental Status: He is alert and oriented to person, place, and time.  Cranial Nerves: No cranial nerve deficit.     Sensory: No sensory deficit.     Motor: No weakness.  Psychiatric:        Mood and Affect: Mood normal.     ED Results / Procedures / Treatments   Labs (all labs ordered are listed, but only abnormal results are displayed) Labs Reviewed  BASIC METABOLIC PANEL - Abnormal; Notable for the following components:      Result Value   Glucose, Bld 195 (*)    Calcium 8.7 (*)    All other components within normal limits  CBC - Abnormal; Notable for the following components:   RDW 18.7 (*)    Platelets 523 (*)    All other components within normal limits  URINALYSIS, ROUTINE W REFLEX MICROSCOPIC - Abnormal; Notable for the following components:   Specific Gravity, Urine 1.032 (*)    Glucose, UA >=500 (*)    Protein, ur 100 (*)    All other components within normal limits  HEPATIC FUNCTION PANEL - Abnormal; Notable for the following components:   Total Protein 6.1 (*)    Albumin 3.2 (*)    All other components within normal limits  URINE CULTURE  LIPASE, BLOOD    EKG EKG Interpretation Date/Time:  Sunday November 10 2023 13:08:12 EST Ventricular Rate:  61 PR Interval:  151 QRS Duration:  115 QT Interval:  430 QTC Calculation: 434 R Axis:   -58  Text Interpretation: Sinus rhythm Incomplete RBBB and  LAFB Abnrm T, consider ischemia, anterolateral lds Interpretation limited secondary to artifact No significant change since last tracing Confirmed by Vanetta Mulders [phone]) on 11/10/2023 1:17:53 PM  Radiology CT Head Wo Contrast Result Date: 11/10/2023 CLINICAL DATA:  Evaluate for neuro deficit. New onset vertigo. History of pancreas cancer. EXAM: CT HEAD WITHOUT CONTRAST TECHNIQUE: Contiguous axial images were obtained from the base of the skull through the vertex without intravenous contrast. RADIATION DOSE REDUCTION: This exam was performed according to the departmental dose-optimization program which includes automated exposure control, adjustment of the mA and/or kV according to patient size and/or use of iterative reconstruction technique. COMPARISON:  None Available. FINDINGS: Brain: No evidence of acute infarction, hemorrhage, hydrocephalus, extra-axial collection or mass lesion/mass effect. Multifocal patchy areas of low attenuation within the periventricular and subcortical white matter. Prominence of the sulci and ventricles compatible with mild age related brain atrophy. Vascular: No hyperdense vessel or unexpected calcification. Skull: Normal. Negative for fracture or focal lesion. Sinuses/Orbits: No acute finding. Other: None. IMPRESSION: 1. No acute intracranial abnormalities. 2. 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 Electronically Signed   By: Signa Kell M.D.   On: 11/10/2023 16:01    Procedures Procedures    Medications Ordered in ED Medications  0.9 %  sodium chloride infusion ( Intravenous New Bag/Given 11/10/23 1454)  sodium chloride 0.9 % bolus 500 mL (0 mLs Intravenous Stopped 11/10/23 1454)  meclizine (ANTIVERT) tablet 25 mg (25 mg Oral Given 11/10/23 1400)    ED Course/ Medical Decision Making/ A&P                                 Medical Decision Making Amount and/or Complexity of Data Reviewed Labs: ordered. Radiology:  ordered.  Risk Prescription drug management.   Patient at rest without any symptoms.  Will get head CT if that does not show any acute process we will do MRI.  Patient  aware game plan.  Patient is liver function test albumin low at 3.2 LFTs are normal including bilirubin very reassuring.  Lipase normal at 20 basic metabolic panel electrolytes normal renal functions normal with a GFR greater than 60.  Blood sugar was 195.  CBC no leukocytosis hemoglobin 13.1 platelets elevated some at 523.  Urinalysis was somewhat concentrated.  Specific gravity was 1032 no bacteria white blood cells normal.  RBCs were 21-50 RBCs.  Urine culture sent.  Head CT results pending.  Patient given a little bit of IV fluids.  Patient also given Antivert.  Patient's head CT just came back no acute findings.  Will proceed with MRI to rule out CVA or metastatic disease.  MRI brain no evidence of intracranial metastatic disease some limitation since it was not a contrast study.  No evidence of acute intercranial abnormality.  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  Nothing to explain the cute onset of vertigo.  Patient stable for discharge home we will continue with Antivert.  Have him follow-up with his doctors.  Patient earlier in the day seem to be doing fairly well from what appears to be positional vertigo.  No vomiting.  Patient now starting to have some dizziness when he sat up.  Will redosed him with the Antivert.  Will send his Antivert to the 24-hour pharmacy.  Does have a walker at home.   Final Clinical Impression(s) / ED Diagnoses Final diagnoses:  Vertigo  Pancreatic cancer metastasized to liver [facility])    Rx / DC Orders ED Discharge Orders     None         Vanetta Mulders, MD 11/10/23 1608    Vanetta Mulders, MD 11/10/23 1611    Vanetta Mulders, MD 11/10/23 1901    Vanetta Mulders, MD 11/10/23 1928

## 2023-11-10 NOTE — ED Triage Notes (Addendum)
Patient BIB [facility] from home. Has pancreatic cancer, receiving palliative care. Has chronic abdominal and back pain. Today feeling dizzy since waking up at 10am.

## 2023-11-11 ENCOUNTER — Other Ambulatory Visit (HOSPITAL_COMMUNITY): Payer: Self-pay | Admitting: Interventional Radiology

## 2023-11-11 ENCOUNTER — Other Ambulatory Visit: Payer: Self-pay | Admitting: Hematology

## 2023-11-11 ENCOUNTER — Other Ambulatory Visit: Payer: Self-pay

## 2023-11-11 DIAGNOSIS — C259 Malignant neoplasm of pancreas, unspecified: Secondary | ICD-10-CM

## 2023-11-11 LAB — URINE CULTURE: Culture: <10000 — AB

## 2023-11-11 NOTE — Progress Notes (Signed)

## 2023-11-12 ENCOUNTER — Ambulatory Visit: Payer: Medicare Other | Admitting: Hematology

## 2023-11-12 ENCOUNTER — Other Ambulatory Visit: Payer: Medicare Other

## 2023-11-12 ENCOUNTER — Telehealth: Payer: Self-pay

## 2023-11-12 ENCOUNTER — Encounter: Payer: Self-pay | Admitting: Genetic Counselor

## 2023-11-12 ENCOUNTER — Other Ambulatory Visit: Payer: Self-pay | Admitting: Nurse Practitioner

## 2023-11-12 DIAGNOSIS — C252 Malignant neoplasm of tail of pancreas: Secondary | ICD-10-CM

## 2023-11-12 NOTE — Addendum Note (Signed)
Addended by: Christiane Ha on: 11/12/2023 02:03 PM   Modules accepted: Orders

## 2023-11-12 NOTE — Progress Notes (Deleted)
Per CareEverywhere, genetic testing for [employer] Common Hereditary Cancers panel was initiated at [facility] through on 2/7. Results pending. Cancelled point of care genetics labs at [facility].

## 2023-11-12 NOTE — Telephone Encounter (Signed)
Pt called c/o increased back pain post fall on 11/10/23, reporting "nothing is helping" per Lowella Bandy, NP, pt to go to orthopedic urgent care for further work up in the interim pt may take MS Contin every 8 hours. Pt verbalized understanding, no further needs at this time.

## 2023-11-12 NOTE — Progress Notes (Addendum)
Per CareEverywhere, genetic testing for [employer] Common Hereditary Cancers panel was initiated at [facility] on 2/7. Results pending. Cancelled point of care genetics labs at [facility].

## 2023-11-13 ENCOUNTER — Encounter (HOSPITAL_COMMUNITY): Payer: Self-pay

## 2023-11-13 ENCOUNTER — Ambulatory Visit: Payer: Medicare Other

## 2023-11-13 NOTE — Progress Notes (Signed)
RE: Eliquis Received: Yesterday [name], Scott Ping, MD  [facility] Yes,  He may hold his Eliquis for 2 days prior to procedure and ASA 5 days prior to procedure    Kristeen Miss, MD 11/12/2023 2:45 PM   [facility] [address]  [address] [place], [place]  [address] Phone: [phone]; Fax: [phone]

## 2023-11-14 ENCOUNTER — Other Ambulatory Visit: Payer: Self-pay | Admitting: Nurse Practitioner

## 2023-11-14 MED ORDER — OXYCODONE HCL 5 MG PO TABS
5.0000 mg | ORAL_TABLET | ORAL | 0 refills | Status: DC | PRN
Start: 2023-11-14 — End: 2023-11-15

## 2023-11-15 ENCOUNTER — Other Ambulatory Visit (HOSPITAL_COMMUNITY): Payer: Self-pay

## 2023-11-15 ENCOUNTER — Other Ambulatory Visit: Payer: Self-pay | Admitting: Nurse Practitioner

## 2023-11-15 ENCOUNTER — Encounter: Payer: Self-pay | Admitting: Hematology

## 2023-11-15 ENCOUNTER — Telehealth: Payer: Self-pay

## 2023-11-15 ENCOUNTER — Other Ambulatory Visit: Payer: Self-pay

## 2023-11-15 DIAGNOSIS — C252 Malignant neoplasm of tail of pancreas: Secondary | ICD-10-CM

## 2023-11-15 DIAGNOSIS — G893 Neoplasm related pain (acute) (chronic): Secondary | ICD-10-CM

## 2023-11-15 DIAGNOSIS — Z515 Encounter for palliative care: Secondary | ICD-10-CM

## 2023-11-15 MED ORDER — OXYCODONE HCL 5 MG PO TABS
5.0000 mg | ORAL_TABLET | ORAL | 0 refills | Status: DC | PRN
  Filled 2023-11-15: qty 120, 10d supply, fill #0

## 2023-11-15 NOTE — Telephone Encounter (Signed)
Received a telephone call from Selena Batten at [facility] [phone]). Kim stated they received patient's records but no referral.  Checked patient's chart and did not see any mention of patient transferring care from [facility] to [facility].  Reached out to the patient. Patient states his daughter had reached out to [facility] and [facility] on her own to see what options they could come up with for patient's care.  Patient stated after speaking with his family they are going to continue care with Dr. Mosetta Putt at [facility]. Spoke with Selena Batten at [facility], let her know patient's plan for now.

## 2023-11-19 ENCOUNTER — Inpatient Hospital Stay: Payer: Medicare Other

## 2023-11-19 ENCOUNTER — Inpatient Hospital Stay (HOSPITAL_BASED_OUTPATIENT_CLINIC_OR_DEPARTMENT_OTHER): Payer: Medicare Other | Admitting: Hematology

## 2023-11-19 ENCOUNTER — Encounter: Payer: Self-pay | Admitting: Hematology

## 2023-11-19 VITALS — BP 118/84 | HR 90 | Temp 98.3°F | Resp 15 | Wt 118.4 lb

## 2023-11-19 DIAGNOSIS — C787 Secondary malignant neoplasm of liver and intrahepatic bile duct: Secondary | ICD-10-CM | POA: Diagnosis not present

## 2023-11-19 DIAGNOSIS — C252 Malignant neoplasm of tail of pancreas: Secondary | ICD-10-CM

## 2023-11-19 DIAGNOSIS — K8689 Other specified diseases of pancreas: Secondary | ICD-10-CM

## 2023-11-19 DIAGNOSIS — Z5111 Encounter for antineoplastic chemotherapy: Secondary | ICD-10-CM | POA: Diagnosis present

## 2023-11-19 DIAGNOSIS — Z79631 Long term (current) use of antimetabolite agent: Secondary | ICD-10-CM | POA: Diagnosis not present

## 2023-11-19 DIAGNOSIS — Z95828 Presence of other vascular implants and grafts: Secondary | ICD-10-CM

## 2023-11-19 LAB — CBC WITH DIFFERENTIAL (CANCER CENTER ONLY)
Abs Immature Granulocytes: 0.02 10*3/uL (ref 0.00–0.07)
Basophils Absolute: 0.1 10*3/uL (ref 0.0–0.1)
Basophils Relative: 1 %
Eosinophils Absolute: 0.2 10*3/uL (ref 0.0–0.5)
Eosinophils Relative: 2 %
HCT: 40.5 % (ref 39.0–52.0)
Hemoglobin: 13.4 g/dL (ref 13.0–17.0)
Immature Granulocytes: 0 %
Lymphocytes Relative: 28 %
Lymphs Abs: 2.4 10*3/uL (ref 0.7–4.0)
MCH: 27.2 pg (ref 26.0–34.0)
MCHC: 33.1 g/dL (ref 30.0–36.0)
MCV: 82.2 fL (ref 80.0–100.0)
Monocytes Absolute: 0.9 10*3/uL (ref 0.1–1.0)
Monocytes Relative: 11 %
Neutro Abs: 4.9 10*3/uL (ref 1.7–7.7)
Neutrophils Relative %: 58 %
Platelet Count: 485 10*3/uL — ABNORMAL HIGH (ref 150–400)
RBC: 4.93 MIL/uL (ref 4.22–5.81)
RDW: 19.8 % — ABNORMAL HIGH (ref 11.5–15.5)
WBC Count: 8.4 10*3/uL (ref 4.0–10.5)
nRBC: 0 % (ref 0.0–0.2)

## 2023-11-19 LAB — CMP (CANCER CENTER ONLY)
ALT: 42 U/L (ref 0–44)
AST: 68 U/L — ABNORMAL HIGH (ref 15–41)
Albumin: 3.7 g/dL (ref 3.5–5.0)
Alkaline Phosphatase: 236 U/L — ABNORMAL HIGH (ref 38–126)
Anion gap: 9 (ref 5–15)
BUN: 15 mg/dL (ref 8–23)
CO2: 25 mmol/L (ref 22–32)
Calcium: 9.3 mg/dL (ref 8.9–10.3)
Chloride: 103 mmol/L (ref 98–111)
Creatinine: 0.95 mg/dL (ref 0.61–1.24)
GFR, Estimated: >60 mL/min (ref >60)
Glucose, Bld: 157 mg/dL — ABNORMAL HIGH (ref 70–99)
Potassium: 4.2 mmol/L (ref 3.5–5.1)
Sodium: 137 mmol/L (ref 135–145)
Total Bilirubin: 0.7 mg/dL (ref 0.0–1.2)
Total Protein: 6.8 g/dL (ref 6.5–8.1)

## 2023-11-19 MED ORDER — PROCHLORPERAZINE MALEATE 10 MG PO TABS
10.0000 mg | ORAL_TABLET | Freq: Once | ORAL | Status: AC
  Administered 2023-11-19: 10 mg via ORAL
  Filled 2023-11-19: qty 1

## 2023-11-19 MED ORDER — SODIUM CHLORIDE 0.9 % IV SOLN: INTRAVENOUS | Status: DC

## 2023-11-19 MED ORDER — SODIUM CHLORIDE 0.9 % IV SOLN
600.0000 mg/m2 | Freq: Once | INTRAVENOUS | Status: AC
  Administered 2023-11-19: 1026 mg via INTRAVENOUS
  Filled 2023-11-19: qty 26.98

## 2023-11-19 MED ORDER — SODIUM CHLORIDE 0.9 % IV SOLN
INTRAVENOUS | Status: DC
Start: 2023-11-19 — End: 2023-11-19

## 2023-11-19 MED ORDER — HEPARIN SOD (PORK) LOCK FLUSH 100 UNIT/ML IV SOLN
500.0000 [IU] | Freq: Once | INTRAVENOUS | Status: AC | PRN
  Administered 2023-11-19: 500 [IU]

## 2023-11-19 MED ORDER — SODIUM CHLORIDE 0.9% FLUSH
10.0000 mL | INTRAVENOUS | Status: DC | PRN
  Administered 2023-11-19: 10 mL

## 2023-11-19 MED ORDER — SODIUM CHLORIDE 0.9% FLUSH
10.0000 mL | Freq: Once | INTRAVENOUS | Status: AC
  Administered 2023-11-19: 10 mL

## 2023-11-19 NOTE — Patient Instructions (Signed)
 [facility] - A DEPT OF [facility]  Discharge Instructions: Thank you for choosing [facility] to provide your oncology and hematology care.   If you have a lab appointment with the Cancer Center, please go directly to the Cancer Center and check in at the registration area.   Wear comfortable clothing and clothing appropriate for easy access to any Portacath or PICC line.   We strive to give you quality time with your provider. You may need to reschedule your appointment if you arrive late (15 or more minutes).  Arriving late affects you and other patients whose appointments are after yours.  Also, if you miss three or more appointments without notifying the office, you may be dismissed from the clinic at the provider's discretion.      For prescription refill requests, have your pharmacy contact our office and allow 72 hours for refills to be completed.    Today you received the following chemotherapy and/or immunotherapy agents Gemcitabine.      To help prevent nausea and vomiting after your treatment, we encourage you to take your nausea medication as directed.  BELOW ARE SYMPTOMS THAT SHOULD BE REPORTED IMMEDIATELY: *FEVER GREATER THAN 100.4 F (38 C) OR HIGHER *CHILLS OR SWEATING *NAUSEA AND VOMITING THAT IS NOT CONTROLLED WITH YOUR NAUSEA MEDICATION *UNUSUAL SHORTNESS OF BREATH *UNUSUAL BRUISING OR BLEEDING *URINARY PROBLEMS (pain or burning when urinating, or frequent urination) *BOWEL PROBLEMS (unusual diarrhea, constipation, pain near the anus) TENDERNESS IN MOUTH AND THROAT WITH OR WITHOUT PRESENCE OF ULCERS (sore throat, sores in mouth, or a toothache) UNUSUAL RASH, SWELLING OR PAIN  UNUSUAL VAGINAL DISCHARGE OR ITCHING   Items with * indicate a potential emergency and should be followed up as soon as possible or go to the Emergency Department if any problems should occur.  Please show the CHEMOTHERAPY ALERT CARD or  IMMUNOTHERAPY ALERT CARD at check-in to the Emergency Department and triage nurse.  Should you have questions after your visit or need to cancel or reschedule your appointment, please contact [facility] - A DEPT OF [facility]  Dept: [phone]  and follow the prompts.  Office hours are 8:00 a.m. to 4:30 p.m. Monday - Friday. Please note that voicemails left after 4:00 p.m. may not be returned until the following business day.  We are closed weekends and major holidays. You have access to a nurse at all times for urgent questions. Please call the main number to the clinic Dept: [phone] and follow the prompts.   For any non-urgent questions, you may also contact your provider using MyChart. We now offer e-Visits for anyone 68 and older to request care online for non-urgent symptoms. For details visit [email or web address].   Also download the MyChart app! Go to the app store, search "MyChart", open the app, select [place], and log in with your MyChart username and password.  Gemcitabine Injection What is this medication? GEMCITABINE (jem SYE ta been) treats some types of cancer. It works by slowing down the growth of cancer cells. This medicine may be used for other purposes; ask your health care provider or pharmacist if you have questions. COMMON BRAND NAME(S): Gemzar, Infugem What should I tell my care team before I take this medication? They need to know if you have any of these conditions: Blood disorders Infection Kidney disease Liver disease Lung or breathing disease, such as asthma or COPD Recent or ongoing  radiation therapy An unusual or allergic reaction to gemcitabine, other medications, foods, dyes, or preservatives If you or your partner are pregnant or trying to get pregnant Breast-feeding How should I use this medication? This medication is injected into a vein. It is given by your care team in a hospital or clinic setting. Talk  to your care team about the use of this medication in children. Special care may be needed. Overdosage: If you think you have taken too much of this medicine contact a poison control center or emergency room at once. NOTE: This medicine is only for you. Do not share this medicine with others. What if I miss a dose? Keep appointments for follow-up doses. It is important not to miss your dose. Call your care team if you are unable to keep an appointment. What may interact with this medication? Interactions have not been studied. This list may not describe all possible interactions. Give your health care provider a list of all the medicines, herbs, non-prescription drugs, or dietary supplements you use. Also tell them if you smoke, drink alcohol, or use illegal drugs. Some items may interact with your medicine. What should I watch for while using this medication? Your condition will be monitored carefully while you are receiving this medication. This medication may make you feel generally unwell. This is not uncommon, as chemotherapy can affect healthy cells as well as cancer cells. Report any side effects. Continue your course of treatment even though you feel ill unless your care team tells you to stop. In some cases, you may be given additional medications to help with side effects. Follow all directions for their use. This medication may increase your risk of getting an infection. Call your care team for advice if you get a fever, chills, sore throat, or other symptoms of a cold or flu. Do not treat yourself. Try to avoid being around people who are sick. This medication may increase your risk to bruise or bleed. Call your care team if you notice any unusual bleeding. Be careful brushing or flossing your teeth or using a toothpick because you may get an infection or bleed more easily. If you have any dental work done, tell your dentist you are receiving this medication. Avoid taking medications that  contain aspirin, acetaminophen, ibuprofen, naproxen, or ketoprofen unless instructed by your care team. These medications may hide a fever. Talk to your care team if you or your partner wish to become pregnant or think you might be pregnant. This medication can cause serious birth defects if taken during pregnancy and for 6 months after the last dose. A negative pregnancy test is required before starting this medication. A reliable form of contraception is recommended while taking this medication and for 6 months after the last dose. Talk to your care team about effective forms of contraception. Do not father a child while taking this medication and for 3 months after the last dose. Use a condom while having sex during this time period. Do not breastfeed while taking this medication and for at least 1 week after the last dose. This medication may cause infertility. Talk to your care team if you are concerned about your fertility. What side effects may I notice from receiving this medication? Side effects that you should report to your care team as soon as possible: Allergic reactions--skin rash, itching, hives, swelling of the face, lips, tongue, or throat Capillary leak syndrome--stomach or muscle pain, unusual weakness or fatigue, feeling faint or lightheaded, decrease in  the amount of urine, swelling of the ankles, hands, or feet, trouble breathing Infection--fever, chills, cough, sore throat, wounds that don't heal, pain or trouble when passing urine, general feeling of discomfort or being unwell Liver injury--right upper belly pain, loss of appetite, nausea, light-colored stool, dark yellow or brown urine, yellowing skin or eyes, unusual weakness or fatigue Low red blood cell level--unusual weakness or fatigue, dizziness, headache, trouble breathing Lung injury--shortness of breath or trouble breathing, cough, spitting up blood, chest pain, fever Stomach pain, bloody diarrhea, pale skin, unusual  weakness or fatigue, decrease in the amount of urine, which may be signs of hemolytic uremic syndrome Sudden and severe headache, confusion, change in vision, seizures, which may be signs of posterior reversible encephalopathy syndrome (PRES) Unusual bruising or bleeding Side effects that usually do not require medical attention (report to your care team if they continue or are bothersome): Diarrhea Drowsiness Hair loss Nausea Pain, redness, or swelling with sores inside the mouth or throat Vomiting This list may not describe all possible side effects. Call your doctor for medical advice about side effects. You may report side effects to FDA at 1-800-FDA-1088. Where should I keep my medication? This medication is given in a hospital or clinic. It will not be stored at home. NOTE: This sheet is a summary. It may not cover all possible information. If you have questions about this medicine, talk to your doctor, pharmacist, or health care provider.  2024 Elsevier/Gold Standard (2022-01-16 00:00:00)

## 2023-11-19 NOTE — Assessment & Plan Note (Signed)
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  -baseline CA19.9 872 -EUS pancreatic mass biopsy confirmed adenocarcinoma. -He has seen surgeon [name] -We reviewed his case in tumor board, and recommended neoadjuvant chemotherapy.  Will repeat CT with pancreatic cancer protocol after neoadjuvant chemo. -Staging PET scan from 8/26 reviewed, no metastatic disease, there is some mild uptake in the periaortic lymph node, will monitor in the future scan.  I also discussed incidental finding of thyroid nodule, I ordered thyroid ultrasound for evaluation. -He started first cycle of chemotherapy FOLFIRINOX on May 16, 2023. He tolerated first cycle moderately well with fatigue, cold sensitivity and abdominal pain etc, discussed management of side effect. -Due to significant weight loss and fatigue after first cycle chemo, I reduced his oxaliplatin and irinotecan dose from cycle 2. -He is not tolerating chemo well, his CA19.9 has not changed much since he started chemo. I the option of changing treatment to gemcitabine and abraxane.  -Restaging CT scan from July 04, 2023 showed partial response.   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  Unfortunately multiple liver nodule was noticed during the surgery, and all 3 liver biopsy showed metastatic disease.  Tumor superior mesenteric vein margin was positive, 1 out of 28 lymph nodes were positive. 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 This also abnormal tissue abutting the margin of the SMA and celiac. This is highly concerning for local spread and worsening disease. This lesion is hypermetabolic on PET 10/29/2023 and no other distant mets on PET -I recommend chemo gemcitabine and abraxane for recurrent disease, which is unfortunately not curable.

## 2023-11-19 NOTE — Progress Notes (Signed)
 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  -baseline CA19.9 872 -EUS pancreatic mass biopsy confirmed adenocarcinoma. -He has seen surgeon [name] -We reviewed his case in tumor board, and recommended neoadjuvant chemotherapy.  Will repeat CT with pancreatic cancer protocol after neoadjuvant chemo. -Staging PET scan from 8/26 reviewed, no metastatic disease, there is some mild uptake in the periaortic lymph node, will monitor in the future scan.  I also discussed incidental finding of thyroid nodule, I ordered thyroid ultrasound for evaluation. -He started first cycle of chemotherapy FOLFIRINOX on May 16, 2023. He tolerated first cycle moderately well with fatigue, cold sensitivity and abdominal pain etc, discussed management of side effect. -Due to significant weight loss and fatigue after first cycle chemo, I reduced his oxaliplatin and irinotecan dose from cycle 2. -He is not tolerating chemo well, his CA19.9 has not changed much since he started chemo. I the option of changing treatment to gemcitabine and abraxane.  -Restaging CT scan from July 04, 2023 showed partial response.   -He underwent subtotal distal pancreatectomy and splenectomy on  August 13, 2023.  Unfortunately multiple liver nodule was noticed during the surgery, and all 3 liver biopsy showed metastatic disease.  Tumor superior mesenteric vein margin was positive, 1 out of 28 lymph nodes were positive. 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 This also abnormal tissue abutting the margin of the SMA and celiac. This is highly concerning for local spread and worsening disease. This lesion is hypermetabolic on PET 10/29/2023 and no other distant mets on PET -I recommend chemo gemcitabine and abraxane for recurrent disease, which is unfortunately not curable.     Assessment and Plan    Pancreatic Cancer 73 year old male with pancreatic cancer, experiencing significant symptoms including weight loss (7-8 pounds over two weeks, now 118 pounds), fatigue, and pain. Difficulty eating solid foods but tolerating smoothies and Ensure. Scheduled for a celiac plexus block procedure to manage pain. Undergoing reduced-dose gemcitabine chemotherapy today to shrink the tumor, improve pain, and alleviate symptoms, with the understanding it will not cure the cancer. [name] willing to proceed to improve quality of life and extend time for potential new treatments. - Administer reduced dose of gemcitabine chemotherapy today - Proceed with celiac plexus block procedure on Friday - Schedule follow-up appointment on March 18 for the second cycle of chemotherapy - Monitor weight and nutritional intake - Consider referral to a dietician if nutritional status does not improve  Chronic Back Pain Chronic lower back pain exacerbated by a recent fall, localized  to the lumbar area, likely related to previous back  surgery. Significant pain for 2-3 days post-fall, now improved but still cautious with movements. - Proceed with scheduled celiac plexus block procedure to manage pain - Monitor pain levels and mobility - Consider additional pain management strategies if necessary  Vertigo Recent onset of vertigo, confirmed by emergency room visit and brain scans which ruled out metastasis. Symptoms include dizziness and falls, with a significant episode leading to a fall into a coffee table. Taking meclizine, recently adjusted to nighttime use only due to daytime drowsiness. Physical therapy identified positional vertigo on the left side, likely due to otoliths in the inner ear. - Continue physical therapy exercises for positional vertigo - Continue nighttime use of meclizine - Monitor for improvement in vertigo symptoms  General Health Maintenance Discussed the importance of maintaining nutritional intake and monitoring weight. Trying various foods and supplements to improve nutritional status. - Encourage intake of smoothies and Ensure - Monitor weight and nutritional intake - Consider referral to a dietician if nutritional status does not improve  Plan -Lab reviewed, adequate for treatment, will proceed for cycle day 1 gemcitabine today, with dose reduction due to pending procedure -He will return next week for cycle 1 day 8 chemo at the same dose - Proceed with celiac plexus block procedure on Friday         SUMMARY OF ONCOLOGIC HISTORY: Oncology History Overview Note   Cancer Staging  Pancreatic cancer [facility]) Staging form: Exocrine Pancreas, AJCC 8th Edition - Clinical stage from 05/01/2023: Stage III (cT3, cN2, cM0) - Signed by [name], MD on 05/03/2023     Pancreatic cancer (HCC)  05/01/2023 Cancer Staging   Staging form: Exocrine Pancreas, AJCC 8th Edition - Clinical stage from 05/01/2023: Stage III (cT3, cN2, cM0) - Signed by [name], MD on 05/03/2023   05/02/2023 Initial Diagnosis    Pancreatic cancer (HCC)   05/16/2023 - 06/28/2023 Chemotherapy   [name] is on Treatment Plan : PANCREAS Modified FOLFIRINOX q14d x 4 cycles     07/09/2023 - 07/09/2023 Chemotherapy   [name] is on Treatment Plan : PANCREATIC Abraxane D1,8,15 + Gemcitabine D1,8,15 q28d     08/13/2023 Surgery   Open distal sub total pancreatectomy, partial gastrectomy, and partial small bowel resection. Dr. [name] [facility] Surgery   11/19/2023 -  Chemotherapy   [name] is on Treatment Plan : PANCREAS Gemcitabine D1,8,15 (1000) q21d x 4 Cycles        Discussed the use of AI scribe software for clinical note transcription with the [name], who gave verbal consent to proceed.  History of Present Illness   A Scott [name] with a history of pancreatic cancer presents with recent onset of vertigo, increased fatigue, and weight loss. The [name] reports an episode of vertigo that was so severe it resulted in a fall and a visit to the emergency room. The vertigo was diagnosed as positional and is worse on the left side. The [name] has been managing the vertigo with meclizine, but this has been contributing to his fatigue.  The [name] also reports increased fatigue and lethargy over the past week. He describes this as more than just tiredness, but a feeling of extreme weakness. This has been accompanied by a loss of appetite and subsequent weight loss of 7-8 pounds in the past two weeks. The [name] has been struggling to find foods that he can tolerate eating.  In addition to these symptoms, the [name] also reports a recent fall while trying  to retrieve items from the freezer. The [name] did not sustain any significant injuries from the fall but has been more cautious about walking since the incident.  The [name] is scheduled for a celiac block procedure for pain management and is currently considering participation in a clinical trial for his cancer treatment. However, he expresses hesitation  about participating in a phase one trial.         All other systems were reviewed with the [name] and are negative.  MEDICAL HISTORY:  Past Medical History:  Diagnosis Date   Arthritis    Bilateral Knees/Fingers   CAD (coronary artery disease)    Cancer (HCC) 2024   Pancreatic Cancer   Cerebrovascular disease    Diabetes mellitus without complication (HCC)    Dysrhythmia    Hx. A.Fib. Been NSR since ablation   History of kidney stones    HLD (hyperlipidemia)    HTN (hypertension)    IBS (irritable bowel syndrome)    [name] states did not have   Lumbar stenosis    MI 1996   Persistent atrial fibrillation (HCC)    PVD (peripheral vascular disease) (HCC)    Stroke (cerebrum) (HCC)    Tobacco abuse     SURGICAL HISTORY: Past Surgical History:  Procedure Laterality Date   ABDOMINAL AORTOGRAM W/LOWER EXTREMITY N/A 01/25/2017   Procedure: Abdominal Aortogram w/Lower Extremity;  Surgeon: Sherren Kerns, MD;  Location: [facility] INVASIVE CV LAB;  Service: Cardiovascular;  Laterality: N/A;   ANTERIOR CRUCIATE LIGAMENT REPAIR     BOWEL RESECTION N/A 08/13/2023   Procedure: PARTIAL SMALL BOWEL RESECTION;  Surgeon: Almond Lint, MD;  Location: [facility] OR;  Service: General;  Laterality: N/A;   CARDIAC CATHETERIZATION     CONE   CARDIAC CATHETERIZATION N/A 07/27/2016   Procedure: Left Heart Cath and Coronary Angiography;  Surgeon: Tonny Bollman, MD;  Location: [facility] INVASIVE CV LAB;  Service: Cardiovascular;  Laterality: N/A;   CARDIOVERSION N/A 06/29/2016   Procedure: CARDIOVERSION;  Surgeon: Pricilla Riffle, MD;  Location: [facility] ENDOSCOPY;  Service: Cardiovascular;  Laterality: N/A;   CARDIOVERSION N/A 03/05/2018   Procedure: CARDIOVERSION;  Surgeon: Laurey Morale, MD;  Location: [facility] ENDOSCOPY;  Service: Cardiovascular;  Laterality: N/A;   CHOLECYSTECTOMY     ENDARTERECTOMY Right 12/06/2017   Procedure: ENDARTERECTOMY CAROTID RIGHT;  Surgeon: Nada Libman, MD;  Location: [facility] OR;   Service: Vascular;  Laterality: Right;   ESOPHAGOGASTRODUODENOSCOPY (EGD) WITH PROPOFOL N/A 05/01/2023   Procedure: ESOPHAGOGASTRODUODENOSCOPY (EGD) WITH PROPOFOL;  Surgeon: Willis Modena, MD;  Location: [facility] ENDOSCOPY;  Service: Gastroenterology;  Laterality: N/A;   FINE NEEDLE ASPIRATION N/A 05/01/2023   Procedure: FINE NEEDLE ASPIRATION (FNA) LINEAR;  Surgeon: Willis Modena, MD;  Location: [facility] ENDOSCOPY;  Service: Gastroenterology;  Laterality: N/A;   IR RADIOLOGIST EVAL & MGMT  11/08/2023   KNEE SURGERY Bilateral 1982, 1988, 1997, 2000   x 4 times.   LAPAROSCOPY N/A 08/13/2023   Procedure: LAPAROSCOPY DIAGNOSTIC;  Surgeon: Almond Lint, MD;  Location: [facility] OR;  Service: General;  Laterality: N/A;   LOWER EXTREMITY ANGIOGRAPHY N/A 07/03/2019   Procedure: LOWER EXTREMITY ANGIOGRAPHY;  Surgeon: Sherren Kerns, MD;  Location: [facility] INVASIVE CV LAB;  Service: Cardiovascular;  Laterality: N/A;   LUMBAR LAMINECTOMY/DECOMPRESSION MICRODISCECTOMY Bilateral 09/09/2018   Procedure: Laminectomy and Foraminotomy bilateral Lumbar three-Lumbar four - Lumbar four-Lumbar five;  Surgeon: Julio Sicks, MD;  Location: [facility] OR;  Service: Neurosurgery;  Laterality: Bilateral;   PARTIAL GASTRECTOMY N/A 08/13/2023   Procedure: PARTIAL GASTRECTOMY;  Surgeon: Almond Lint, MD;  Location: [facility] OR;  Service: General;  Laterality: N/A;   PATCH ANGIOPLASTY Right 12/06/2017   Procedure: PATCH ANGIOPLASTY RIGHT CAROTID ARTERY;  Surgeon: Nada Libman, MD;  Location: [facility] OR;  Service: Vascular;  Laterality: Right;   PERIPHERAL VASCULAR INTERVENTION Right 01/25/2017   Procedure: Peripheral Vascular Intervention;  Surgeon: Sherren Kerns, MD;  Location: [facility] INVASIVE CV LAB;  Service: Cardiovascular;  Laterality: Right;   PERIPHERAL VASCULAR INTERVENTION Right 07/03/2019   Procedure: PERIPHERAL VASCULAR INTERVENTION;  Surgeon: Sherren Kerns, MD;  Location: [facility] INVASIVE CV LAB;  Service: Cardiovascular;  Laterality: Right;    PORTACATH PLACEMENT Left 05/14/2023   Procedure: INSERTION PORT-A-CATH;  Surgeon: Almond Lint, MD;  Location: [facility] OR;  Service: General;  Laterality: Left;   TONSILLECTOMY     UPPER ESOPHAGEAL ENDOSCOPIC ULTRASOUND (EUS) Bilateral 05/01/2023   Procedure: UPPER ESOPHAGEAL ENDOSCOPIC ULTRASOUND (EUS);  Surgeon: Willis Modena, MD;  Location: [facility] ENDOSCOPY;  Service: Gastroenterology;  Laterality: Bilateral;   XI ROBOTIC ASSISTED LAPAROSCOPIC DISTAL PANCREATECTOMY N/A 08/13/2023   Procedure: HAND ASSISTED LAPAROSCOPIC SPLENECTOMY;  Surgeon: Almond Lint, MD;  Location: [facility] OR;  Service: General;  Laterality: N/A;    I have reviewed the social history and family history with the [name] and they are unchanged from previous note.  ALLERGIES:  is allergic to augmentin [amoxicillin-pot clavulanate].  MEDICATIONS:  Current Outpatient Medications  Medication Sig Dispense Refill   acetaminophen (TYLENOL) 500 MG tablet Take 2 tablets (1,000 mg total) by mouth every 6 (six) hours. 30 tablet 0   apixaban (ELIQUIS) 5 MG TABS tablet Take 1 tablet (5 mg total) by mouth 2 (two) times daily. 60 tablet 5   aspirin EC 81 MG tablet Take 81 mg by mouth daily.     diazepam (VALIUM) 2 MG tablet Take 2 mg by mouth 2 (two) times daily as needed for anxiety.     diclofenac Sodium (VOLTAREN) 1 % GEL Apply 4 g topically 4 (four) times daily as needed (Up to 4x daily max).     esomeprazole (NEXIUM) 40 MG capsule Take 40 mg by mouth daily as needed (heartburn/hiccups).     fenofibrate (TRICOR) 48 MG tablet Take 48 mg by mouth daily.     gabapentin (NEURONTIN) 300 MG capsule Take 900 mg by mouth 2 (two) times daily.     Insulin Glargine (BASAGLAR KWIKPEN) 100 UNIT/ML Inject 8 Units into the skin daily. 15 mL 1   JARDIANCE 25 MG TABS tablet Take 25 mg by mouth daily.     meclizine (ANTIVERT) 25 MG tablet Take 1 tablet (25 mg total) by mouth 3 (three) times daily as needed for dizziness. 30 tablet 0   megestrol (MEGACE) 40  MG/ML suspension Take 200 mg by mouth 2 (two) times daily.     metFORMIN (GLUCOPHAGE-XR) 500 MG 24 hr tablet Take 1,000 mg by mouth daily after supper.     methocarbamol (ROBAXIN) 750 MG tablet Take 1 tablet (750 mg total) by mouth every 8 (eight) hours as needed for muscle spasms. 30 tablet 2   morphine (MS CONTIN) 15 MG 12 hr tablet Take 1 tablet (15 mg total) by mouth every 12 (twelve) hours. 60 tablet 0   ondansetron (ZOFRAN) 4 MG tablet Take 4 mg by mouth every 8 (eight) hours as needed for nausea or vomiting.     oxyCODONE (OXY IR/ROXICODONE) 5 MG immediate release tablet Take 1-2 tablets (5-10 mg total) by mouth every 4 (four) hours as needed for  moderate pain (pain score 4-6) or breakthrough pain. 120 tablet 0   polyethylene glycol (MIRALAX / GLYCOLAX) 17 g packet Take 17 g by mouth daily as needed for moderate constipation.     prochlorperazine (COMPAZINE) 10 MG tablet Take 1 tablet (10 mg total) by mouth every 6 (six) hours as needed for nausea or vomiting (Use for nausea and / or vomiting unresolved with ondansetron (Zofran).). 30 tablet 0   rosuvastatin (CRESTOR) 40 MG tablet Take 40 mg by mouth daily.     sildenafil (VIAGRA) 100 MG tablet Take 100 mg by mouth as needed for erectile dysfunction. ([name] not taking: Reported on 10/09/2023)     spironolactone (ALDACTONE) 25 MG tablet Take 25 mg by mouth daily.     No current facility-administered medications for this visit.    PHYSICAL EXAMINATION: ECOG PERFORMANCE STATUS: 2 - Symptomatic, <50% confined to bed  Vitals:   11/19/23 0947  BP: 118/84  Pulse: 90  Resp: 15  Temp: 98.3 F (36.8 C)  SpO2: 100%   Wt Readings from Last 3 Encounters:  11/19/23 118 lb 6.4 oz (53.7 kg)  11/10/23 125 lb (56.7 kg)  10/28/23 125 lb 8 oz (56.9 kg)     GENERAL:alert, no distress and comfortable SKIN: skin color, texture, turgor are normal, no rashes or significant lesions EYES: normal, Conjunctiva are pink and non-injected, sclera  clear NECK: supple, thyroid normal size, non-tender, without nodularity LYMPH:  no palpable lymphadenopathy in the cervical, axillary  LUNGS: clear to auscultation and percussion with normal breathing effort HEART: regular rate & rhythm and no murmurs and no lower extremity edema ABDOMEN:abdomen soft, non-tender and normal bowel sounds Musculoskeletal:no cyanosis of digits and no clubbing  NEURO: alert & oriented x 3 with fluent speech, no focal motor/sensory deficits   LABORATORY DATA:  I have reviewed the data as listed    Latest Ref Rng & Units 11/19/2023    9:Scott AM 11/10/2023    1:Scott PM 10/22/2023   11:22 AM  CBC  WBC 4.0 - 10.5 K/uL 8.4  10.1  10.7   Hemoglobin 13.0 - 17.0 g/dL 21.3  08.6  57.8   Hematocrit 39.0 - 52.0 % 40.5  40.1  40.9   Platelets 150 - 400 K/uL 485  523  612         Latest Ref Rng & Units 11/19/2023    9:Scott AM 11/10/2023    1:54 PM 11/10/2023    1:Scott PM  CMP  Glucose 70 - 99 mg/dL 469   629   BUN 8 - 23 mg/dL 15   17   Creatinine 5.28 - 1.24 mg/dL 4.13   2.44   Sodium 010 - 145 mmol/L 137   135   Potassium 3.5 - 5.1 mmol/L 4.2   3.9   Chloride 98 - 111 mmol/L 103   104   CO2 22 - 32 mmol/L 25   22   Calcium 8.9 - 10.3 mg/dL 9.3   8.7   Total Protein 6.5 - 8.1 g/dL 6.8  6.1    Total Bilirubin 0.0 - 1.2 mg/dL 0.7  0.6    Alkaline Phos 38 - 126 U/L 236  110    AST 15 - 41 U/L 68  20    ALT 0 - 44 U/L 42  Scott        RADIOGRAPHIC STUDIES: I have personally reviewed the radiological images as listed and agreed with the findings in the report. No results found.  No orders of the defined types were placed in this encounter.  All questions were answered. The [name] knows to call the clinic with any problems, questions or concerns. No barriers to learning was detected. The total time spent in the appointment was 30 minutes.     [name], MD 11/19/2023

## 2023-11-20 ENCOUNTER — Other Ambulatory Visit (HOSPITAL_COMMUNITY): Payer: Self-pay

## 2023-11-20 ENCOUNTER — Telehealth: Payer: Self-pay

## 2023-11-20 LAB — CANCER ANTIGEN 19-9: CA 19-9: 3808 U/mL — ABNORMAL HIGH (ref 0–35)

## 2023-11-20 MED ORDER — MECLIZINE HCL 25 MG PO TABS
25.0000 mg | ORAL_TABLET | Freq: Two times a day (BID) | ORAL | 3 refills | Status: DC | PRN
  Filled 2023-11-20: qty 30, 15d supply, fill #0

## 2023-11-20 NOTE — Telephone Encounter (Signed)
-----   Message from Nurse Threasa Beards sent at 11/19/2023 11:47 AM EST ----- Regarding: Dr Mosetta Putt pt, first time Gemcitabine Dr Mosetta Putt pt came in 2/25 for first time Gemcitabine. Tolerated infusion well. Needs call back.

## 2023-11-20 NOTE — Telephone Encounter (Signed)
 LM for patient that this nurse was calling to see how they were doing after their treatment. Please call back to Dr. Latanya Maudlin nurse at [phone] if they have any questions or concerns regarding the treatment.

## 2023-11-21 ENCOUNTER — Other Ambulatory Visit (HOSPITAL_COMMUNITY): Payer: Self-pay

## 2023-11-21 ENCOUNTER — Other Ambulatory Visit: Payer: Self-pay | Admitting: Radiology

## 2023-11-21 ENCOUNTER — Other Ambulatory Visit: Payer: Self-pay

## 2023-11-21 DIAGNOSIS — C252 Malignant neoplasm of tail of pancreas: Secondary | ICD-10-CM

## 2023-11-21 MED ORDER — MEGESTROL ACETATE 40 MG/ML PO SUSP
200.0000 mg | Freq: Two times a day (BID) | ORAL | 12 refills | Status: DC
  Filled 2023-11-21: qty 240, 24d supply, fill #0

## 2023-11-21 NOTE — H&P (Signed)
 Chief Complaint: pancreatic carcinoma; abdominal pain - image guided celiac plexus block   Referring Provider(s): Almond Lint   Supervising Physician: Gilmer Mor  Patient Status: [facility] - Out-pt  History of Present Illness: Scott [name] is a 73 y.o. male with a history of arthritis, diabetes, hyperlipidemia, hypertension, and recently diagnosed pancreatic adenocarcinoma.  Pt was initially diagnosed on 04/11/23 by CT revealing pancreatic mass after having pain and weight loss.  He subsequently underwent a mass biopsy which confirmed adenocarcinoma.  He is followed by [name] who is managing his current treatment of chemotherapy; he also underwent laparoscopy, splenectomy, distal subtotal pancreatectomy, partial gastrectomy, and partial small bowel resection on 08/13/23.  He was referred to interventional radiology for possible treatment of his continued abdominal pain.  He met with Dr. Loreta Ave on 11/08/23 in consultation for an image guided celiac plexus block where the procedure, benefits, risks, implications, and complications were discussed in detail.  Scott [name] decided to move forward with the procedure and is scheduled for image guided celiac plexus block 11/22/23 with Dr. Loreta Ave.    Patient is Full Code  Past Medical History:  Diagnosis Date   Arthritis    Bilateral Knees/Fingers   CAD (coronary artery disease)    Cancer (HCC) 2024   Pancreatic Cancer   Cerebrovascular disease    Diabetes mellitus without complication (HCC)    Dysrhythmia    Hx. A.Fib. Been NSR since ablation   History of kidney stones    HLD (hyperlipidemia)    HTN (hypertension)    IBS (irritable bowel syndrome)    Patient states did not have   Lumbar stenosis    MI 1996   Persistent atrial fibrillation (HCC)    PVD (peripheral vascular disease) (HCC)    Stroke (cerebrum) (HCC)    Tobacco abuse     Past Surgical History:  Procedure Laterality Date   ABDOMINAL AORTOGRAM W/LOWER EXTREMITY N/A  01/25/2017   Procedure: Abdominal Aortogram w/Lower Extremity;  Surgeon: Sherren Kerns, MD;  Location: [facility] INVASIVE CV LAB;  Service: Cardiovascular;  Laterality: N/A;   ANTERIOR CRUCIATE LIGAMENT REPAIR     BOWEL RESECTION N/A 08/13/2023   Procedure: PARTIAL SMALL BOWEL RESECTION;  Surgeon: Almond Lint, MD;  Location: [facility] OR;  Service: General;  Laterality: N/A;   CARDIAC CATHETERIZATION     CONE   CARDIAC CATHETERIZATION N/A 07/27/2016   Procedure: Left Heart Cath and Coronary Angiography;  Surgeon: Tonny Bollman, MD;  Location: [facility] INVASIVE CV LAB;  Service: Cardiovascular;  Laterality: N/A;   CARDIOVERSION N/A 06/29/2016   Procedure: CARDIOVERSION;  Surgeon: Pricilla Riffle, MD;  Location: [facility] ENDOSCOPY;  Service: Cardiovascular;  Laterality: N/A;   CARDIOVERSION N/A 03/05/2018   Procedure: CARDIOVERSION;  Surgeon: Laurey Morale, MD;  Location: [facility] ENDOSCOPY;  Service: Cardiovascular;  Laterality: N/A;   CHOLECYSTECTOMY     ENDARTERECTOMY Right 12/06/2017   Procedure: ENDARTERECTOMY CAROTID RIGHT;  Surgeon: Nada Libman, MD;  Location: [facility] OR;  Service: Vascular;  Laterality: Right;   ESOPHAGOGASTRODUODENOSCOPY (EGD) WITH PROPOFOL N/A 05/01/2023   Procedure: ESOPHAGOGASTRODUODENOSCOPY (EGD) WITH PROPOFOL;  Surgeon: Willis Modena, MD;  Location: [facility] ENDOSCOPY;  Service: Gastroenterology;  Laterality: N/A;   FINE NEEDLE ASPIRATION N/A 05/01/2023   Procedure: FINE NEEDLE ASPIRATION (FNA) LINEAR;  Surgeon: Willis Modena, MD;  Location: [facility] ENDOSCOPY;  Service: Gastroenterology;  Laterality: N/A;   IR RADIOLOGIST EVAL & MGMT  11/08/2023   KNEE SURGERY Bilateral 1982, 1988, 1997, 2000   x 4  times.   LAPAROSCOPY N/A 08/13/2023   Procedure: LAPAROSCOPY DIAGNOSTIC;  Surgeon: Almond Lint, MD;  Location: [facility] OR;  Service: General;  Laterality: N/A;   LOWER EXTREMITY ANGIOGRAPHY N/A 07/03/2019   Procedure: LOWER EXTREMITY ANGIOGRAPHY;  Surgeon: Sherren Kerns, MD;  Location: [facility] INVASIVE  CV LAB;  Service: Cardiovascular;  Laterality: N/A;   LUMBAR LAMINECTOMY/DECOMPRESSION MICRODISCECTOMY Bilateral 09/09/2018   Procedure: Laminectomy and Foraminotomy bilateral Lumbar three-Lumbar four - Lumbar four-Lumbar five;  Surgeon: Julio Sicks, MD;  Location: [facility] OR;  Service: Neurosurgery;  Laterality: Bilateral;   PARTIAL GASTRECTOMY N/A 08/13/2023   Procedure: PARTIAL GASTRECTOMY;  Surgeon: Almond Lint, MD;  Location: [facility] OR;  Service: General;  Laterality: N/A;   PATCH ANGIOPLASTY Right 12/06/2017   Procedure: PATCH ANGIOPLASTY RIGHT CAROTID ARTERY;  Surgeon: Nada Libman, MD;  Location: [facility] OR;  Service: Vascular;  Laterality: Right;   PERIPHERAL VASCULAR INTERVENTION Right 01/25/2017   Procedure: Peripheral Vascular Intervention;  Surgeon: Sherren Kerns, MD;  Location: [facility] INVASIVE CV LAB;  Service: Cardiovascular;  Laterality: Right;   PERIPHERAL VASCULAR INTERVENTION Right 07/03/2019   Procedure: PERIPHERAL VASCULAR INTERVENTION;  Surgeon: Sherren Kerns, MD;  Location: [facility] INVASIVE CV LAB;  Service: Cardiovascular;  Laterality: Right;   PORTACATH PLACEMENT Left 05/14/2023   Procedure: INSERTION PORT-A-CATH;  Surgeon: Almond Lint, MD;  Location: [facility] OR;  Service: General;  Laterality: Left;   TONSILLECTOMY     UPPER ESOPHAGEAL ENDOSCOPIC ULTRASOUND (EUS) Bilateral 05/01/2023   Procedure: UPPER ESOPHAGEAL ENDOSCOPIC ULTRASOUND (EUS);  Surgeon: Willis Modena, MD;  Location: [facility] ENDOSCOPY;  Service: Gastroenterology;  Laterality: Bilateral;   XI ROBOTIC ASSISTED LAPAROSCOPIC DISTAL PANCREATECTOMY N/A 08/13/2023   Procedure: HAND ASSISTED LAPAROSCOPIC SPLENECTOMY;  Surgeon: Almond Lint, MD;  Location: [facility] OR;  Service: General;  Laterality: N/A;    Allergies: Augmentin [amoxicillin-pot clavulanate]  Medications: Prior to Admission medications   Medication Sig Start Date End Date Taking? Authorizing Provider  acetaminophen (TYLENOL) 500 MG tablet Take 2 tablets (1,000 mg  total) by mouth every 6 (six) hours. 08/16/23   Almond Lint, MD  apixaban (ELIQUIS) 5 MG TABS tablet Take 1 tablet (5 mg total) by mouth 2 (two) times daily. 07/03/23   [name], [name], MD  aspirin EC 81 MG tablet Take 81 mg by mouth daily. 09/08/20   [provider]  diazepam (VALIUM) 2 MG tablet Take 2 mg by mouth 2 (two) times daily as needed for anxiety. 02/18/20   [provider]  diclofenac Sodium (VOLTAREN) 1 % GEL Apply 4 g topically 4 (four) times daily as needed (Up to 4x daily max). 08/16/23   Almond Lint, MD  esomeprazole (NEXIUM) 40 MG capsule Take 40 mg by mouth daily as needed (heartburn/hiccups).    [provider]  fenofibrate (TRICOR) 48 MG tablet Take 48 mg by mouth daily. 01/16/22   [provider]  gabapentin (NEURONTIN) 300 MG capsule Take 900 mg by mouth 2 (two) times daily. 02/03/20   [provider]  Insulin Glargine (BASAGLAR KWIKPEN) 100 UNIT/ML Inject 8 Units into the skin daily. 08/19/23   Kinsinger, De Blanch, MD  JARDIANCE 25 MG TABS tablet Take 25 mg by mouth daily. 03/03/22   [provider]  meclizine (ANTIVERT) 25 MG tablet Take 1 tablet (25 mg total) by mouth 3 (three) times daily as needed for dizziness. 11/10/23   Vanetta Mulders, MD  meclizine (ANTIVERT) 25 MG tablet Take 1 tablet (25 mg total) by mouth every 12 (twelve) hours as needed  for vertigo for 15 days 11/19/23     megestrol (MEGACE) 40 MG/ML suspension Take 200 mg by mouth 2 (two) times daily. 08/30/23 08/29/24  [provider]  metFORMIN (GLUCOPHAGE-XR) 500 MG 24 hr tablet Take 1,000 mg by mouth daily after supper. 01/10/22   [provider]  methocarbamol (ROBAXIN) 750 MG tablet Take 1 tablet (750 mg total) by mouth every 8 (eight) hours as needed for muscle spasms. 08/16/23   Almond Lint, MD  morphine (MS CONTIN) 15 MG 12 hr tablet Take 1 tablet (15 mg total) by mouth every 12 (twelve) hours. 11/01/23   Pickenpack-Cousar, Arty Baumgartner,  NP  ondansetron (ZOFRAN) 4 MG tablet Take 4 mg by mouth every 8 (eight) hours as needed for nausea or vomiting.    [provider]  oxyCODONE (OXY IR/ROXICODONE) 5 MG immediate release tablet Take 1-2 tablets (5-10 mg total) by mouth every 4 (four) hours as needed for moderate pain (pain score 4-6) or breakthrough pain. 11/15/23   Pickenpack-Cousar, Arty Baumgartner, NP  polyethylene glycol (MIRALAX / GLYCOLAX) 17 g packet Take 17 g by mouth daily as needed for moderate constipation.    [provider]  prochlorperazine (COMPAZINE) 10 MG tablet Take 1 tablet (10 mg total) by mouth every 6 (six) hours as needed for nausea or vomiting (Use for nausea and / or vomiting unresolved with ondansetron (Zofran).). 08/16/23   Almond Lint, MD  rosuvastatin (CRESTOR) 40 MG tablet Take 40 mg by mouth daily.    [provider]  sildenafil (VIAGRA) 100 MG tablet Take 100 mg by mouth as needed for erectile dysfunction. Patient not taking: Reported on 10/09/2023 04/14/18   [provider]  spironolactone (ALDACTONE) 25 MG tablet Take 25 mg by mouth daily.    [provider]     Family History  Problem Relation Age of Onset   Heart disease Father    Melanoma Father    Testicular cancer Father    Hypertension Mother    Arthritis Mother    Arthritis Sister    Lung disease Neg Hx     Social History   Socioeconomic History   Marital status: Married    Spouse name: Lurena Joiner   Number of children: 2   Years of education: Not on file   Highest education level: Not on file  Occupational History   Occupation: retired  Tobacco Use   Smoking status: Every Day    Current packs/day: 0.00    Average packs/day: 1 pack/day for 50.0 years (50.0 ttl pk-yrs)    Types: Cigarettes    Start date: 06/02/1971    Last attempt to quit: 12/09/2017    Years since quitting: 5.9   Smokeless tobacco: Never   Tobacco comments:    Pt smokes one pack cigarettes per day  Vaping Use   Vaping  status: Some Days   Substances: Nicotine  Substance and Sexual Activity   Alcohol use: Yes    Comment: Rarely   Drug use: No   Sexual activity: Not Currently  Other Topics Concern   Not on file  Social History Narrative   He is originally from [place]. He moved to [facility] in 1965. Previously has traveled to most of the [place]. Has traveled to [place] & [place]. Has been to [place], [place], & [place] 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 Has a dog currently. Remote bird exposure in 1980. No mold exposure.    Social Drivers of Corporate investment banker Strain: Not  on file  Food Insecurity: No Food Insecurity (08/13/2023)   Hunger Vital Sign    Worried About Running Out of Food in the Last Year: Never true    Ran Out of Food in the Last Year: Never true  Transportation Needs: No Transportation Needs (08/13/2023)   PRAPARE - Administrator, Civil Service (Medical): No    Lack of Transportation (Non-Medical): No  Physical Activity: Not on file  Stress: Not on file  Social Connections: Not on file     Review of Systems: A 12 point ROS discussed and pertinent positives are indicated in the HPI above.  All other systems are negative.  Review of Systems  Constitutional:  Negative for fatigue and fever.  HENT:  Negative for congestion and dental problem.   Respiratory:  Negative for cough, chest tightness, shortness of breath and wheezing.   Cardiovascular:  Negative for chest pain.  Gastrointestinal:  Positive for abdominal pain. Negative for diarrhea, nausea and vomiting.  Neurological:  Negative for light-headedness and headaches.  Psychiatric/Behavioral:  Negative for agitation, behavioral problems and confusion.     Vital Signs: Ht 5\' 10"  (1.778 m)   Wt 118 lb 6.2 oz (53.7 kg)   BMI 16.99 kg/m   Advance Care Plan: The advanced care place/surrogate decision maker was discussed at the time of visit and the patient did not wish to discuss or was not able to  name a surrogate decision maker or provide an advance care plan.  Physical Exam Constitutional:      Appearance: Normal appearance.  HENT:     Head: Normocephalic and atraumatic.     Mouth/Throat:     Mouth: Mucous membranes are moist.  Cardiovascular:     Rate and Rhythm: Normal rate and regular rhythm.  Pulmonary:     Effort: Pulmonary effort is normal.     Breath sounds: Normal breath sounds.  Abdominal:     General: Bowel sounds are normal.     Palpations: Abdomen is soft.  Musculoskeletal:        General: Normal range of motion.     Cervical back: Normal range of motion.  Skin:    General: Skin is warm and dry.  Neurological:     Mental Status: He is alert and oriented to person, place, and time.  Psychiatric:        Mood and Affect: Mood normal.        Behavior: Behavior normal.     Imaging: MR Brain Wo Contrast (neuro protocol) Result Date: 11/10/2023 CLINICAL DATA:  Provided history: Neuro deficit, acute, stroke suspected. Metastatic disease evaluation. EXAM: MRI HEAD WITHOUT CONTRAST TECHNIQUE: Multiplanar, multiecho pulse sequences of the brain and surrounding structures were obtained without intravenous contrast. COMPARISON:  Non-contrast head CT 11/10/2023.  Brain MRI 12/04/2017. FINDINGS: Please note, assessment for intracranial metastatic disease is limited on this non-contrast brain MRI. Intermittent motion degradation also limits evaluation. Most notably, the axial FLAIR sequence is severely motion degraded and the coronal T2 sequence is moderate-to-severely motion degraded. Within these limitations, findings are as follows. Brain: Generalized cerebral atrophy. 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 Multifocal T2 FLAIR hyperintense signal abnormality within the cerebral white matter, nonspecific but compatible with mild-to-moderate chronic small  vessel ischemic disease. Mild chronic small vessel ischemic changes within the pons. There is no acute infarct. No evidence of an intracranial mass. No  chronic intracranial blood products. No extra-axial fluid collection. No midline shift. Vascular: Maintained flow voids within the proximal large arterial vessels. Skull and upper cervical spine: No focal worrisome marrow lesion. Sinuses/Orbits: No mass or acute finding within the imaged orbits. No significant paranasal sinus disease. IMPRESSION: 1. Please note, assessment for intracranial metastatic disease is limited on this non-contrast brain MRI. The examination is also intermittently motion degraded, as described. Within these limitations, findings are as follows. 2. No evidence of intracranial metastatic disease. 3.  No evidence of an acute intracranial abnormality. 4. 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 5. Chronic small vessel ischemic changes within the cerebral white matter (mild-to-moderate) and pons (mild). 6. Generalized cerebral atrophy. Electronically Signed   By: Jackey Loge D.O.   On: 11/10/2023 18:45   CT Head Wo Contrast Result Date: 11/10/2023 CLINICAL DATA:  Evaluate for neuro deficit. New onset vertigo. History of pancreas cancer. EXAM: CT HEAD WITHOUT CONTRAST TECHNIQUE: Contiguous axial images were obtained from the base of the skull through the vertex without intravenous contrast. RADIATION DOSE REDUCTION: This exam was performed according to the departmental dose-optimization program which includes automated exposure control, adjustment of the mA and/or kV according to patient size and/or use of iterative reconstruction technique. COMPARISON:  None Available. FINDINGS: Brain: No evidence of acute infarction, hemorrhage, hydrocephalus, extra-axial collection or mass lesion/mass effect. Multifocal patchy areas of low attenuation within the periventricular and subcortical white matter.  Prominence of the sulci and ventricles compatible with mild age related brain atrophy. Vascular: No hyperdense vessel or unexpected calcification. Skull: Normal. Negative for fracture or focal lesion. Sinuses/Orbits: No acute finding. Other: None. IMPRESSION: 1. No acute intracranial abnormalities. 2. 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 Electronically Signed   By: Signa Kell M.D.   On: 11/10/2023 16:01   IR Radiologist Eval & Mgmt Result Date: 11/08/2023 EXAM: NEW PATIENT OFFICE VISIT CHIEF COMPLAINT: Electronic medical record HISTORY OF PRESENT ILLNESS: Electronic medical record REVIEW OF SYSTEMS: Electronic medical record PHYSICAL EXAMINATION: Electronic medical record ASSESSMENT AND PLAN: Electronic medical record Electronically Signed   By: Gilmer Mor D.O.   On: 11/08/2023 14:13   NM PET Image Restage (PS) Skull Base to Thigh (F-18 FDG) Result Date: 10/29/2023 CLINICAL DATA:  Subsequent treatment strategy for pancreatic cancer. EXAM: NUCLEAR MEDICINE PET SKULL BASE TO THIGH TECHNIQUE: 6.25 mCi F-18 FDG was injected intravenously. Full-ring PET imaging was performed from the skull base to thigh after the radiotracer. CT data was obtained and used for attenuation correction and anatomic localization. Fasting blood glucose: 121 mg/dl COMPARISON:  Prior PET-CT 05/20/2023 and most recent CT scan 10/15/2023 FINDINGS: Mediastinal blood pool activity: SUV max 1.70 Liver activity: SUV max NA NECK: Persistent bilateral hypermetabolic parotid or periparotid lesions. The right-sided parotid gland lesion has an SUV max of 3.25 and is unchanged. The left parotid or periparotid nodule has an SUV max of 6.61 and was previously 5.66. Persistent hypermetabolic left thyroid nodule. This measures approximately 14 mm and SUV max is 6.11. This was previously 6.3. This has been evaluated on previous imaging. (ref: J Am Coll Radiol. 2015 Feb;12(2): 143-50). Incidental CT findings: Advanced  bilateral carotid artery calcifications. CHEST: No hypermetabolic mediastinal or hilar nodes. No suspicious pulmonary nodules on the CT scan. Incidental CT findings: Stable advanced atherosclerotic calcifications involving the aorta, branch vessels and coronary arteries. ABDOMEN/PELVIS: The abnormal soft tissue lesion in the region of the pancreatic bed described on the  prior abdominal CT scan is hypermetabolic with SUV max of 9.03. This area measures approximately 4.0 x 4.0 cm and is highly worrisome for residual or recurrent tumor. No findings to suggest metastatic disease. No hypermetabolic hepatic or adrenal gland lesions. There are several small mesenteric lymph nodes but no definite hypermetabolism. Incidental CT findings: Improved/largely resolved fluid collections in the lesser sac area likely resolving postoperative fluid collections. Stable advanced vascular disease. Persistent biliary dilatation. SKELETON: No findings suspicious for osseous metastatic disease. Incidental CT findings: None. IMPRESSION: 1. The abnormal soft tissue lesion in the region of the pancreatic bed described on the prior abdominal CT scan is hypermetabolic and highly worrisome for residual or recurrent tumor. 2. No findings for metastatic disease. 3. Persistent bilateral hypermetabolic parotid or periparotid lesions. 4. Persistent hypermetabolic left thyroid nodule. 5. Improved/largely resolved fluid collections in the lesser sac area likely resolving postoperative fluid collections. 6. Stable advanced vascular disease. 7. Aortic atherosclerosis. Electronically Signed   By: Rudie Meyer M.D.   On: 10/29/2023 17:33    Labs:  CBC: Recent Labs    09/10/23 1020 10/22/23 1122 11/10/23 1327 11/19/23 0927  WBC 12.8* 10.7* 10.1 8.4  HGB 14.0 13.3 13.1 13.4  HCT 41.3 40.9 40.1 40.5  PLT 576* 612* 523* 485*    COAGS: No results for input(s): "INR", "APTT" in the last 8760 hours.  BMP: Recent Labs    10/09/23 1102  10/22/23 1122 11/10/23 1327 11/19/23 0927  NA 134* 136 135 137  K 4.4 4.3 3.9 4.2  CL 100 103 104 103  CO2 26 26 22 25   GLUCOSE 162* 135* 195* 157*  BUN 18 19 17 15   CALCIUM 9.3 9.3 8.7* 9.3  CREATININE 0.91 0.82 0.77 0.95  GFRNONAA >60 >60 >60 >60    LIVER FUNCTION TESTS: Recent Labs    10/09/23 1102 10/22/23 1122 11/10/23 1354 11/19/23 0927  BILITOT 0.4 0.4 0.6 0.7  AST 15 14* 20 68*  ALT 13 10 27  42  ALKPHOS 94 78 110 236*  PROT 6.4* 6.6 6.1* 6.8  ALBUMIN 3.5 3.5 3.2* 3.7    TUMOR MARKERS: No results for input(s): "AFPTM", "CEA", "CA199", "CHROMGRNA" in the last 8760 hours.  Assessment and Plan:  Pt with pancreatic carcinoma and abdominal pain scheduled for image guided celiac plexus block 11/22/23 with Dr. Loreta Ave.    Risks and benefits of celiac plexus block were discussed with the patient including allergic reaction, decreased blood flow to the spinal cord/paralysis, gastroparesis, nerve damage, bleeding, infection, and damage to adjacent structures.  All of the patient's questions were answered, patient is agreeable to proceed. Consent signed and in chart.  Thank you for allowing our service to participate in Scott [name] 's care.  Electronically Signed: Loman Brooklyn, PA-C   11/22/2023, 8:06 AM    I spent a total of 15 Minutes in face to face in clinical consultation, greater than 50% of which was counseling/coordinating care for image guided celiac plexus block.

## 2023-11-22 ENCOUNTER — Telehealth: Payer: Self-pay

## 2023-11-22 ENCOUNTER — Ambulatory Visit (HOSPITAL_COMMUNITY)
Admission: RE | Admit: 2023-11-22 | Discharge: 2023-11-22 | Disposition: A | Discharge status: Home / Self Care | Payer: Medicare Other | Source: Ambulatory Visit | Attending: Interventional Radiology | Admitting: Interventional Radiology

## 2023-11-22 ENCOUNTER — Telehealth: Payer: Self-pay | Admitting: Hematology

## 2023-11-22 ENCOUNTER — Other Ambulatory Visit (HOSPITAL_COMMUNITY): Payer: Self-pay

## 2023-11-22 ENCOUNTER — Other Ambulatory Visit: Payer: Self-pay

## 2023-11-22 ENCOUNTER — Encounter (HOSPITAL_COMMUNITY): Payer: Self-pay

## 2023-11-22 VITALS — BP 156/67 | HR 54 | Temp 98.5°F | Resp 16 | Ht 70.0 in | Wt 118.4 lb

## 2023-11-22 DIAGNOSIS — Z903 Acquired absence of stomach [part of]: Secondary | ICD-10-CM | POA: Diagnosis not present

## 2023-11-22 DIAGNOSIS — I10 Essential (primary) hypertension: Secondary | ICD-10-CM | POA: Insufficient documentation

## 2023-11-22 DIAGNOSIS — Z9081 Acquired absence of spleen: Secondary | ICD-10-CM | POA: Insufficient documentation

## 2023-11-22 DIAGNOSIS — E785 Hyperlipidemia, unspecified: Secondary | ICD-10-CM | POA: Insufficient documentation

## 2023-11-22 DIAGNOSIS — Z90411 Acquired partial absence of pancreas: Secondary | ICD-10-CM | POA: Insufficient documentation

## 2023-11-22 DIAGNOSIS — Z7984 Long term (current) use of oral hypoglycemic drugs: Secondary | ICD-10-CM | POA: Diagnosis not present

## 2023-11-22 DIAGNOSIS — R269 Unspecified abnormalities of gait and mobility: Secondary | ICD-10-CM

## 2023-11-22 DIAGNOSIS — E1151 Type 2 diabetes mellitus with diabetic peripheral angiopathy without gangrene: Secondary | ICD-10-CM | POA: Insufficient documentation

## 2023-11-22 DIAGNOSIS — F17210 Nicotine dependence, cigarettes, uncomplicated: Secondary | ICD-10-CM | POA: Diagnosis not present

## 2023-11-22 DIAGNOSIS — C259 Malignant neoplasm of pancreas, unspecified: Secondary | ICD-10-CM | POA: Diagnosis present

## 2023-11-22 DIAGNOSIS — Z794 Long term (current) use of insulin: Secondary | ICD-10-CM | POA: Insufficient documentation

## 2023-11-22 DIAGNOSIS — C252 Malignant neoplasm of tail of pancreas: Secondary | ICD-10-CM

## 2023-11-22 DIAGNOSIS — R109 Unspecified abdominal pain: Secondary | ICD-10-CM | POA: Diagnosis present

## 2023-11-22 DIAGNOSIS — G894 Chronic pain syndrome: Secondary | ICD-10-CM

## 2023-11-22 LAB — COMPREHENSIVE METABOLIC PANEL
ALT: 56 U/L — ABNORMAL HIGH (ref 0–44)
AST: 64 U/L — ABNORMAL HIGH (ref 15–41)
Albumin: 3.6 g/dL (ref 3.5–5.0)
Alkaline Phosphatase: 269 U/L — ABNORMAL HIGH (ref 38–126)
Anion gap: 10 (ref 5–15)
BUN: 19 mg/dL (ref 8–23)
CO2: 25 mmol/L (ref 22–32)
Calcium: 9.2 mg/dL (ref 8.9–10.3)
Chloride: 98 mmol/L (ref 98–111)
Creatinine, Ser: 0.93 mg/dL (ref 0.61–1.24)
GFR, Estimated: >60 mL/min (ref >60)
Glucose, Bld: 122 mg/dL — ABNORMAL HIGH (ref 70–99)
Potassium: 4.1 mmol/L (ref 3.5–5.1)
Sodium: 133 mmol/L — ABNORMAL LOW (ref 135–145)
Total Bilirubin: 1.2 mg/dL (ref 0.0–1.2)
Total Protein: 7.1 g/dL (ref 6.5–8.1)

## 2023-11-22 LAB — GLUCOSE, CAPILLARY: Glucose-Capillary: 112 mg/dL — ABNORMAL HIGH (ref 70–99)

## 2023-11-22 LAB — CBC
HCT: 39.5 % (ref 39.0–52.0)
Hemoglobin: 12.9 g/dL — ABNORMAL LOW (ref 13.0–17.0)
MCH: 27.4 pg (ref 26.0–34.0)
MCHC: 32.7 g/dL (ref 30.0–36.0)
MCV: 84 fL (ref 80.0–100.0)
Platelets: 453 10*3/uL — ABNORMAL HIGH (ref 150–400)
RBC: 4.7 MIL/uL (ref 4.22–5.81)
RDW: 19.7 % — ABNORMAL HIGH (ref 11.5–15.5)
WBC: 9.9 10*3/uL (ref 4.0–10.5)
nRBC: 0 % (ref 0.0–0.2)

## 2023-11-22 LAB — PROTIME-INR
INR: 1.2 (ref 0.8–1.2)
Prothrombin Time: 15 s (ref 11.4–15.2)

## 2023-11-22 MED ORDER — MIDAZOLAM HCL 2 MG/2ML IJ SOLN
INTRAMUSCULAR | Status: DC | PRN
  Administered 2023-11-22: .5 mg via INTRAVENOUS
  Administered 2023-11-22: 1 mg via INTRAVENOUS

## 2023-11-22 MED ORDER — MIDAZOLAM HCL 2 MG/2ML IJ SOLN
INTRAMUSCULAR | Status: AC
  Filled 2023-11-22: qty 4

## 2023-11-22 MED ORDER — ROPIVACAINE HCL 5 MG/ML IJ SOLN
30.0000 mL | Freq: Once | INTRAMUSCULAR | Status: DC
  Filled 2023-11-22: qty 30

## 2023-11-22 MED ORDER — METHYLPREDNISOLONE ACETATE 80 MG/ML IJ SUSP: 80.0000 mg | Freq: Once | INTRAMUSCULAR | Status: DC

## 2023-11-22 MED ORDER — FENTANYL CITRATE (PF) 100 MCG/2ML IJ SOLN
INTRAMUSCULAR | Status: DC | PRN
  Administered 2023-11-22: 50 ug via INTRAVENOUS

## 2023-11-22 MED ORDER — SODIUM CHLORIDE 0.9 % IV SOLN: INTRAVENOUS | Status: DC

## 2023-11-22 MED ORDER — FENTANYL CITRATE (PF) 100 MCG/2ML IJ SOLN
INTRAMUSCULAR | Status: AC
Start: 2023-11-22 — End: ?
  Filled 2023-11-22: qty 4

## 2023-11-22 MED ORDER — IOHEXOL 300 MG/ML  SOLN
30.0000 mg | Freq: Once | INTRAMUSCULAR | Status: AC | PRN
  Administered 2023-11-22: 0.1 mL via INTRAVENOUS

## 2023-11-22 NOTE — Telephone Encounter (Signed)
 Received a call from [facility] reporting that patient is currently taking oxycodone 5mg  tablets, 2 tablets every 2 hours. NP notified, patient is currently getting a celiac plexus block this AM, will schedule follow up to further discuss pain and symptom management.

## 2023-11-22 NOTE — Telephone Encounter (Signed)
 Patient has called in wanting to cancel appointment with [facility] on 11/26/2023, patient stated they did not want to be rescheduled with them they and they did not want to be seen by them.

## 2023-11-22 NOTE — Procedures (Signed)
 Interventional Radiology Procedure Note  Procedure:  CT guided celiac plexus block. Ropivacaine 0.5%, 30cc 80mg  methylprednisolone   Complications: None  Recommendations:  - Ok to shower tomorrow - Do not submerge for 7 days - Routine care - advance diet - 1 hr dc home  Signed,  Yvone Neu. Loreta Ave, DO, ABVM, RPVI

## 2023-11-23 ENCOUNTER — Other Ambulatory Visit (HOSPITAL_COMMUNITY): Payer: Self-pay

## 2023-11-24 ENCOUNTER — Other Ambulatory Visit: Payer: Self-pay | Admitting: Nurse Practitioner

## 2023-11-24 DIAGNOSIS — C252 Malignant neoplasm of tail of pancreas: Secondary | ICD-10-CM

## 2023-11-24 NOTE — Progress Notes (Unsigned)
 Patient Care Team: [name], MD as PCP - General [name], [name], MD as PCP - Cardiology (Cardiology) [name], MD as Consulting Physician (Hematology) Pickenpack-Cousar, Arty Baumgartner, NP as Nurse Practitioner [facility])  Clinic Day:  11/24/2023  Referring physician: [name], MD  ASSESSMENT & PLAN:   Assessment & Plan: Pancreatic cancer [facility]) -cT3N0M0, stage IIA, liver mets 07/2023, and local recurrence in 09/2023 --Patient presented with abdominal pain and weight loss, CT scan revealed a 4.4 x 3.5 cm ill-defined mass in the pancreatic tail, which 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  -baseline CA19.9 872 -EUS pancreatic mass biopsy confirmed adenocarcinoma. -He has seen surgeon [name] -We reviewed his case in tumor board, and recommended neoadjuvant chemotherapy.  Will repeat CT with pancreatic cancer protocol after neoadjuvant chemo. -Staging PET scan from 8/26 reviewed, no metastatic disease, there is some mild uptake in the periaortic lymph node, will monitor in the future scan. Incidental finding of thyroid nodule was discussed  and thyroid ultrasound was ordered for evaluation. -He started first cycle of chemotherapy FOLFIRINOX on May 16, 2023. He tolerated first cycle moderately well with fatigue, cold sensitivity and abdominal pain etc, discussed management of side effect. -Due to significant weight loss and fatigue after first cycle chemo, I reduced his oxaliplatin and irinotecan dose from cycle 2. -He is not tolerating chemo well, his CA19.9 has not changed much since he started chemo. I the option of changing treatment to gemcitabine and abraxane.  -Restaging CT scan from July 04, 2023 showed partial response.   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  Unfortunately multiple liver nodule was noticed during the surgery, and all 3 liver biopsy showed metastatic  disease.  Tumor superior mesenteric vein margin was positive, 1 out of 28 lymph nodes were positive. 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 This also abnormal tissue abutting the margin of the SMA and celiac. This is highly concerning for local spread and worsening disease. This lesion is hypermetabolic on PET 10/29/2023 and no other distant mets on PET -Chemo gemcitabine and abraxane was recommended for recurrent disease, which is unfortunately not curable.  -He started chemotherapy with reduced dose gemcitabine on 11/19/2023.  He did have celiac plexus block on 11/22/2023.    The patient understands the plans discussed today and is in agreement with them.  He knows to contact our office if he develops concerns prior to his next appointment.  I provided *** minutes of face-to-face time during this encounter and > 50% was spent counseling as documented under my assessment and plan.    Carlean Jews, NP  [facility] - A DEPT OF [facility] [address] [place] [address] Dept: [phone] Dept Fax: [phone]   No orders of the defined types were placed in this encounter.     CHIEF COMPLAINT:  CC: Follow-up pancreatic cancer  Current Treatment: Gemcitabine  INTERVAL HISTORY:  Scott [name] is here today for repeat clinical assessment.  First dose of gemcitabine administered 11/19/2023 at reduced dose.  Today is cycle 1 day 8 of gemcitabine at same dose.  Had celiac plexus nerve block on Friday, 11/22/2023.  He denies fevers or chills. He denies pain. His appetite is good. His weight {Weight change:10426}.  I have reviewed the past medical  history, past surgical history, social history and family history with the patient and they are unchanged from previous note.  ALLERGIES:  is allergic to augmentin [amoxicillin-pot clavulanate].  MEDICATIONS:  Current Outpatient Medications  Medication Sig Dispense Refill   acetaminophen (TYLENOL) 500 MG tablet Take 2 tablets (1,000 mg total) by mouth every 6 (six) hours. 30 tablet 0   apixaban (ELIQUIS) 5 MG TABS tablet Take 1 tablet (5 mg total) by mouth 2 (two) times daily. 60 tablet 5   aspirin EC 81 MG tablet Take 81 mg by mouth daily.     diazepam (VALIUM) 2 MG tablet Take 2 mg by mouth 2 (two) times daily as needed for anxiety.     diclofenac Sodium (VOLTAREN) 1 % GEL Apply 4 g topically 4 (four) times daily as needed (Up to 4x daily max).     esomeprazole (NEXIUM) 40 MG capsule Take 40 mg by mouth daily as needed (heartburn/hiccups).     fenofibrate (TRICOR) 48 MG tablet Take 48 mg by mouth daily.     gabapentin (NEURONTIN) 300 MG capsule Take 900 mg by mouth 2 (two) times daily.     Insulin Glargine (BASAGLAR KWIKPEN) 100 UNIT/ML Inject 8 Units into the skin daily. 15 mL 1   JARDIANCE 25 MG TABS tablet Take 25 mg by mouth daily.     meclizine (ANTIVERT) 25 MG tablet Take 1 tablet (25 mg total) by mouth 3 (three) times daily as needed for dizziness. 30 tablet 0   meclizine (ANTIVERT) 25 MG tablet Take 1 tablet (25 mg total) by mouth every 12 (twelve) hours as needed for vertigo for 15 days 30 tablet 3   megestrol (MEGACE) 40 MG/ML suspension Take 200 mg by mouth 2 (two) times daily.     megestrol (MEGACE) 40 MG/ML suspension Take 5 mLs (200 mg total) by mouth 2 (two) times daily. 240 mL 12   metFORMIN (GLUCOPHAGE-XR) 500 MG 24 hr tablet Take 1,000 mg by mouth daily after supper.     methocarbamol (ROBAXIN) 750 MG tablet Take 1 tablet (750 mg total) by mouth every 8 (eight) hours as needed for muscle spasms. 30 tablet 2   morphine (MS CONTIN) 15 MG 12 hr tablet Take 1 tablet (15 mg  total) by mouth every 12 (twelve) hours. 60 tablet 0   ondansetron (ZOFRAN) 4 MG tablet Take 4 mg by mouth every 8 (eight) hours as needed for nausea or vomiting.     oxyCODONE (OXY IR/ROXICODONE) 5 MG immediate release tablet Take 1-2 tablets (5-10 mg total) by mouth every 4 (four) hours as needed for moderate pain (pain score 4-6) or breakthrough pain. 120 tablet 0   polyethylene glycol (MIRALAX / GLYCOLAX) 17 g packet Take 17 g by mouth daily as needed for moderate constipation.     prochlorperazine (COMPAZINE) 10 MG tablet Take 1 tablet (10 mg total) by mouth every 6 (six) hours as needed for nausea or vomiting (Use for nausea and / or vomiting unresolved with ondansetron (Zofran).). 30 tablet 0   rosuvastatin (CRESTOR) 40 MG tablet Take 40 mg by mouth daily.     sildenafil (VIAGRA) 100 MG tablet Take 100 mg by mouth as needed for erectile dysfunction. (Patient not taking: Reported on 10/09/2023)     spironolactone (ALDACTONE) 25 MG tablet Take  25 mg by mouth daily.     No current facility-administered medications for this visit.    HISTORY OF PRESENT ILLNESS:   Oncology History Overview Note   Cancer Staging  Pancreatic cancer [facility]) Staging form: Exocrine Pancreas, AJCC 8th Edition - Clinical stage from 05/01/2023: Stage III (cT3, cN2, cM0) - Signed by [name], MD on 05/03/2023     Pancreatic cancer (HCC)  05/01/2023 Cancer Staging   Staging form: Exocrine Pancreas, AJCC 8th Edition - Clinical stage from 05/01/2023: Stage III (cT3, cN2, cM0) - Signed by [name], MD on 05/03/2023   05/02/2023 Initial Diagnosis   Pancreatic cancer (HCC)   05/16/2023 - 06/28/2023 Chemotherapy   Patient is on Treatment Plan : PANCREAS Modified FOLFIRINOX q14d x 4 cycles     07/09/2023 - 07/09/2023 Chemotherapy   Patient is on Treatment Plan : PANCREATIC Abraxane D1,8,15 + Gemcitabine D1,8,15 q28d     08/13/2023 Surgery   Open distal sub total pancreatectomy, partial gastrectomy, and partial small bowel  resection. Dr. [name] [facility] Surgery   11/19/2023 -  Chemotherapy   Patient is on Treatment Plan : PANCREAS Gemcitabine D1,8,15 (1000) q21d x 4 Cycles         REVIEW OF SYSTEMS:   Constitutional: Denies fevers, chills or abnormal weight loss Eyes: Denies blurriness of vision Ears, nose, mouth, throat, and face: Denies mucositis or sore throat Respiratory: Denies cough, dyspnea or wheezes Cardiovascular: Denies palpitation, chest discomfort or lower extremity swelling Gastrointestinal:  Denies nausea, heartburn or change in bowel habits Skin: Denies abnormal skin rashes Lymphatics: Denies new lymphadenopathy or easy bruising Neurological:Denies numbness, tingling or new weaknesses Behavioral/Psych: Mood is stable, no new changes  All other systems were reviewed with the patient and are negative.   VITALS:  There were no vitals taken for this visit.  Wt Readings from Last 3 Encounters:  11/22/23 118 lb 6.2 oz (53.7 kg)  11/19/23 118 lb 6.4 oz (53.7 kg)  11/10/23 125 lb (56.7 kg)    There is no height or weight on file to calculate BMI.  Performance status (ECOG): {CHL ONC [id number]  PHYSICAL EXAM:   GENERAL:alert, no distress and comfortable SKIN: skin color, texture, turgor are normal, no rashes or significant lesions EYES: normal, Conjunctiva are pink and non-injected, sclera clear OROPHARYNX:no exudate, no erythema and lips, buccal mucosa, and tongue normal  NECK: supple, thyroid normal size, non-tender, without nodularity LYMPH:  no palpable lymphadenopathy in the cervical, axillary or inguinal LUNGS: clear to auscultation and percussion with normal breathing effort HEART: regular rate & rhythm and no murmurs and no lower extremity edema ABDOMEN:abdomen soft, non-tender and normal bowel sounds Musculoskeletal:no cyanosis of digits and no clubbing  NEURO: alert & oriented x 3 with fluent speech, no focal motor/sensory deficits  LABORATORY DATA:  I  have reviewed the data as listed    Component Value Date/Time   NA 133 (L) 11/22/2023 0810   NA 140 01/17/2021 1402   K 4.1 11/22/2023 0810   CL 98 11/22/2023 0810   CO2 25 11/22/2023 0810   GLUCOSE 122 (H) 11/22/2023 0810   BUN 19 11/22/2023 0810   BUN 25 01/17/2021 1402   CREATININE 0.93 11/22/2023 0810   CREATININE 0.95 11/19/2023 0927   CREATININE 1.05 07/23/2016 0929   CALCIUM 9.2 11/22/2023 0810   PROT 7.1 11/22/2023 0810   PROT 6.5 10/23/2017 0933   ALBUMIN 3.6 11/22/2023 0810   ALBUMIN 4.2 10/23/2017 0933   AST 64 (H) 11/22/2023 0810  AST 68 (H) 11/19/2023 0927   ALT 56 (H) 11/22/2023 0810   ALT 42 11/19/2023 0927   ALKPHOS 269 (H) 11/22/2023 0810   BILITOT 1.2 11/22/2023 0810   BILITOT 0.7 11/19/2023 0927   GFRNONAA >60 11/22/2023 0810   GFRNONAA >60 11/19/2023 0927   GFRAA >60 09/03/2018 1527    No results found for: "SPEP", "UPEP"  Lab Results  Component Value Date   WBC 9.9 11/22/2023   NEUTROABS 4.9 11/19/2023   HGB 12.9 (L) 11/22/2023   HCT 39.5 11/22/2023   MCV 84.0 11/22/2023   PLT 453 (H) 11/22/2023      Chemistry      Component Value Date/Time   NA 133 (L) 11/22/2023 0810   NA 140 01/17/2021 1402   K 4.1 11/22/2023 0810   CL 98 11/22/2023 0810   CO2 25 11/22/2023 0810   BUN 19 11/22/2023 0810   BUN 25 01/17/2021 1402   CREATININE 0.93 11/22/2023 0810   CREATININE 0.95 11/19/2023 0927   CREATININE 1.05 07/23/2016 0929      Component Value Date/Time   CALCIUM 9.2 11/22/2023 0810   ALKPHOS 269 (H) 11/22/2023 0810   AST 64 (H) 11/22/2023 0810   AST 68 (H) 11/19/2023 0927   ALT 56 (H) 11/22/2023 0810   ALT 42 11/19/2023 0927   BILITOT 1.2 11/22/2023 0810   BILITOT 0.7 11/19/2023 0981       RADIOGRAPHIC STUDIES: I have personally reviewed the radiological images as listed and agreed with the findings in the report. CT CELIAC PLEXUS BLOCK ANESTHETIC Result Date: 11/22/2023 INDICATION: 73 year old male with pain related to pancreatic  carcinoma. EXAM: CT GUIDED CELIAC PLEXUS NERVE BLOCK MEDICATIONS: None. ANESTHESIA/SEDATION: Moderate (conscious) sedation was employed during this procedure. A total of Versed 1.5 mg and Fentanyl 50 mcg was administered intravenously by the radiology nurse. Total intra-service moderate Sedation Time: 27 minutes. [name] level of consciousness and vital signs were monitored continuously by radiology nursing throughout the procedure under my direct supervision. COMPLICATIONS: None PROCEDURE: Informed written consent was obtained from the patient after a thorough discussion of the procedural risks, benefits and alternatives. All questions were addressed. Maximal Sterile Barrier Technique was utilized including caps, mask, sterile gowns, sterile gloves, sterile drape, hand hygiene and skin antiseptic. A timeout was performed prior to the initiation of the procedure. Patient was positioned supine on the CT gantry table. Scout CT acquired for planning purposes. The patient is prepped and draped in the usual sterile fashion. 1% lidocaine was used for local anesthesia. Using CT guidance, a single 21 gauge 15 cm Chiba needle was advanced from an anterior epigastric approach to the anterior aspect of the aorta, just above the celiac artery. Once the needle was in position small amount of gas was injected. Repeat CT was performed confirming location of the needle and distribution of fluid spread. Single adjustment of the needle tip was performed with repeat CT. We then infused 30 cc volume of 0.5% ropivacaine, as well as 80 mg methylprednisolone in 1 cc. Approximately 2 cc of Isovue contrast was infused with the final administration of ropivacaine. Needle was removed Final CT was acquired. Sterile dressing was applied. Patient tolerated the procedure well and remained hemodynamically stable throughout. No complications were encountered and no significant blood loss. IMPRESSION: Status post CT-guided celiac nerve block  with steroid and anesthetic. Signed, Yvone Neu. Miachel Roux, RPVI [facility] Radiology Electronically Signed   By: Gilmer Mor D.O.  On: 11/22/2023 12:49   MR Brain Wo Contrast (neuro protocol) Result Date: 11/10/2023 CLINICAL DATA:  Provided history: Neuro deficit, acute, stroke suspected. Metastatic disease evaluation. EXAM: MRI HEAD WITHOUT CONTRAST TECHNIQUE: Multiplanar, multiecho pulse sequences of the brain and surrounding structures were obtained without intravenous contrast. COMPARISON:  Non-contrast head CT 11/10/2023.  Brain MRI 12/04/2017. FINDINGS: Please note, assessment for intracranial metastatic disease is limited on this non-contrast brain MRI. Intermittent motion degradation also limits evaluation. Most notably, the axial FLAIR sequence is severely motion degraded and the coronal T2 sequence is moderate-to-severely motion degraded. Within these limitations, findings are as follows. Brain: Generalized cerebral atrophy. 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 Multifocal T2 FLAIR hyperintense signal abnormality within the cerebral white matter, nonspecific but compatible with mild-to-moderate chronic small vessel ischemic disease. Mild chronic small vessel ischemic changes within the pons. There is no acute infarct. No evidence of an intracranial mass. No chronic intracranial blood products. No extra-axial fluid collection. No midline shift. Vascular: Maintained flow voids within the proximal large arterial vessels. Skull and upper cervical spine: No focal worrisome marrow lesion. Sinuses/Orbits: No mass or acute finding within the imaged orbits. No significant paranasal sinus disease. IMPRESSION: 1. Please note, assessment for intracranial metastatic disease is limited on this non-contrast brain MRI. The examination is  also intermittently motion degraded, as described. Within these limitations, findings are as follows. 2. No evidence of intracranial metastatic disease. 3.  No evidence of an acute intracranial abnormality. 4. 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 5. Chronic small vessel ischemic changes within the cerebral white matter (mild-to-moderate) and pons (mild). 6. Generalized cerebral atrophy. Electronically Signed   By: Jackey Loge D.O.   On: 11/10/2023 18:45   CT Head Wo Contrast Result Date: 11/10/2023 CLINICAL DATA:  Evaluate for neuro deficit. New onset vertigo. History of pancreas cancer. EXAM: CT HEAD WITHOUT CONTRAST TECHNIQUE: Contiguous axial images were obtained from the base of the skull through the vertex without intravenous contrast. RADIATION DOSE REDUCTION: This exam was performed according to the departmental dose-optimization program which includes automated exposure control, adjustment of the mA and/or kV according to patient size and/or use of iterative reconstruction technique. COMPARISON:  None Available. FINDINGS: Brain: No evidence of acute infarction, hemorrhage, hydrocephalus, extra-axial collection or mass lesion/mass effect. Multifocal patchy areas of low attenuation within the periventricular and subcortical white matter. Prominence of the sulci and ventricles compatible with mild age related brain atrophy. Vascular: No hyperdense vessel or unexpected calcification. Skull: Normal. Negative for fracture or focal lesion. Sinuses/Orbits: No acute finding. Other: None. IMPRESSION: 1. No acute intracranial abnormalities. 2. 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 Electronically Signed   By: Signa Kell M.D.   On: 11/10/2023 16:01   IR Radiologist Eval & Mgmt Result Date: 11/08/2023 EXAM: NEW PATIENT OFFICE VISIT CHIEF COMPLAINT: Electronic medical record HISTORY OF PRESENT ILLNESS: Electronic medical record  REVIEW OF SYSTEMS: Electronic medical record PHYSICAL EXAMINATION: Electronic medical record ASSESSMENT AND PLAN: Electronic medical record Electronically Signed   By: Gilmer Mor D.O.   On: 11/08/2023 14:13   NM PET Image Restage (PS) Skull Base to Thigh (F-18 FDG) Result Date: 10/29/2023 CLINICAL DATA:  Subsequent treatment strategy for pancreatic cancer. EXAM: NUCLEAR MEDICINE PET SKULL BASE TO THIGH TECHNIQUE: 6.25 mCi F-18 FDG was injected intravenously. Full-ring PET imaging was  performed from the skull base to thigh after the radiotracer. CT data was obtained and used for attenuation correction and anatomic localization. Fasting blood glucose: 121 mg/dl COMPARISON:  Prior PET-CT 05/20/2023 and most recent CT scan 10/15/2023 FINDINGS: Mediastinal blood pool activity: SUV max 1.70 Liver activity: SUV max NA NECK: Persistent bilateral hypermetabolic parotid or periparotid lesions. The right-sided parotid gland lesion has an SUV max of 3.25 and is unchanged. The left parotid or periparotid nodule has an SUV max of 6.61 and was previously 5.66. Persistent hypermetabolic left thyroid nodule. This measures approximately 14 mm and SUV max is 6.11. This was previously 6.3. This has been evaluated on previous imaging. (ref: J Am Coll Radiol. 2015 Feb;12(2): 143-50). Incidental CT findings: Advanced bilateral carotid artery calcifications. CHEST: No hypermetabolic mediastinal or hilar nodes. No suspicious pulmonary nodules on the CT scan. Incidental CT findings: Stable advanced atherosclerotic calcifications involving the aorta, branch vessels and coronary arteries. ABDOMEN/PELVIS: The abnormal soft tissue lesion in the region of the pancreatic bed described on the prior abdominal CT scan is hypermetabolic with SUV max of 9.03. This area measures approximately 4.0 x 4.0 cm and is highly worrisome for residual or recurrent tumor. No findings to suggest metastatic disease. No hypermetabolic hepatic or adrenal gland  lesions. There are several small mesenteric lymph nodes but no definite hypermetabolism. Incidental CT findings: Improved/largely resolved fluid collections in the lesser sac area likely resolving postoperative fluid collections. Stable advanced vascular disease. Persistent biliary dilatation. SKELETON: No findings suspicious for osseous metastatic disease. Incidental CT findings: None. IMPRESSION: 1. The abnormal soft tissue lesion in the region of the pancreatic bed described on the prior abdominal CT scan is hypermetabolic and highly worrisome for residual or recurrent tumor. 2. No findings for metastatic disease. 3. Persistent bilateral hypermetabolic parotid or periparotid lesions. 4. Persistent hypermetabolic left thyroid nodule. 5. Improved/largely resolved fluid collections in the lesser sac area likely resolving postoperative fluid collections. 6. Stable advanced vascular disease. 7. Aortic atherosclerosis. Electronically Signed   By: Rudie Meyer M.D.   On: 10/29/2023 17:33

## 2023-11-24 NOTE — Assessment & Plan Note (Signed)
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  -baseline CA19.9 872 -EUS pancreatic mass biopsy confirmed adenocarcinoma. -He has seen surgeon [name] -We reviewed his case in tumor board, and recommended neoadjuvant chemotherapy.  Will repeat CT with pancreatic cancer protocol after neoadjuvant chemo. -Staging PET scan from 8/26 reviewed, no metastatic disease, there is some mild uptake in the periaortic lymph node, will monitor in the future scan. Incidental finding of thyroid nodule was discussed  and thyroid ultrasound was ordered for evaluation. -He started first cycle of chemotherapy FOLFIRINOX on May 16, 2023. He tolerated first cycle moderately well with fatigue, cold sensitivity and abdominal pain etc, discussed management of side effect. -Due to significant weight loss and fatigue after first cycle chemo, I reduced his oxaliplatin and irinotecan dose from cycle 2. -He is not tolerating chemo well, his CA19.9 has not changed much since he started chemo. I the option of changing treatment to gemcitabine and abraxane.  -Restaging CT scan from July 04, 2023 showed partial response.   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  Unfortunately multiple liver nodule was noticed during the surgery, and all 3 liver biopsy showed metastatic disease.  Tumor superior mesenteric vein margin was positive, 1 out of 28 lymph nodes were positive. He had PR to neoadjuvant chemo  -Restarting chemo with gemcitabine and Xeloda, but he has been recovering very slowly and requested postpone chemo  -CT 10/15/2023 showed a 6.7cm cystic lesion at surgical site and new abnormal infiltrative soft tissue with a encasement of the main portal  vein with worsening severe stenosis. This also abnormal tissue abutting the margin of the SMA and celiac. This is highly concerning for local spread and worsening disease. This lesion is hypermetabolic on PET 10/29/2023 and no other distant mets on PET -Chemo gemcitabine and abraxane was recommended for recurrent disease, which is unfortunately not curable.  -He started chemotherapy with reduced dose gemcitabine on 11/19/2023.  He did have celiac plexus block on 11/22/2023.

## 2023-11-25 ENCOUNTER — Other Ambulatory Visit (HOSPITAL_COMMUNITY): Payer: Self-pay

## 2023-11-25 ENCOUNTER — Other Ambulatory Visit: Payer: Self-pay

## 2023-11-25 DIAGNOSIS — C252 Malignant neoplasm of tail of pancreas: Secondary | ICD-10-CM

## 2023-11-25 DIAGNOSIS — G893 Neoplasm related pain (acute) (chronic): Secondary | ICD-10-CM

## 2023-11-25 DIAGNOSIS — Z515 Encounter for palliative care: Secondary | ICD-10-CM

## 2023-11-25 MED ORDER — OXYCODONE HCL 5 MG PO TABS
5.0000 mg | ORAL_TABLET | ORAL | 0 refills | Status: DC | PRN
  Filled 2023-11-25: qty 120, 10d supply, fill #0

## 2023-11-25 NOTE — Telephone Encounter (Signed)
 Pt wife called for medication refill see associated orders. Pt wife also states that pt pain in his back has improved since the celiac plexus block but pt is still c/o pain in his abdomen. Mrs.Sexson also states that pt is depressed, RN offered a referral to social work for counseling, pt wife declined at this time stating someone from their church was stopping by but verbalized that she would reach out if pt was interested. No further needs at this time.

## 2023-11-25 NOTE — Progress Notes (Unsigned)
 Marland Kitchen

## 2023-11-26 ENCOUNTER — Inpatient Hospital Stay: Payer: Medicare Other | Attending: Hematology | Admitting: Nurse Practitioner

## 2023-11-26 ENCOUNTER — Inpatient Hospital Stay: Payer: Medicare Other

## 2023-11-26 ENCOUNTER — Encounter: Payer: Self-pay | Admitting: Hematology

## 2023-11-26 ENCOUNTER — Inpatient Hospital Stay: Payer: Medicare Other | Attending: Hematology

## 2023-11-26 ENCOUNTER — Inpatient Hospital Stay: Payer: Medicare Other | Admitting: Nurse Practitioner

## 2023-11-26 ENCOUNTER — Encounter: Payer: Medicare Other | Admitting: Dietician

## 2023-11-26 VITALS — BP 134/70 | HR 88 | Temp 98.5°F | Resp 21 | Ht 70.0 in | Wt 114.7 lb

## 2023-11-26 DIAGNOSIS — Z79631 Long term (current) use of antimetabolite agent: Secondary | ICD-10-CM | POA: Diagnosis not present

## 2023-11-26 DIAGNOSIS — C252 Malignant neoplasm of tail of pancreas: Secondary | ICD-10-CM | POA: Insufficient documentation

## 2023-11-26 DIAGNOSIS — Z95828 Presence of other vascular implants and grafts: Secondary | ICD-10-CM

## 2023-11-26 DIAGNOSIS — C787 Secondary malignant neoplasm of liver and intrahepatic bile duct: Secondary | ICD-10-CM | POA: Diagnosis not present

## 2023-11-26 DIAGNOSIS — I951 Orthostatic hypotension: Secondary | ICD-10-CM | POA: Insufficient documentation

## 2023-11-26 DIAGNOSIS — Z5111 Encounter for antineoplastic chemotherapy: Secondary | ICD-10-CM | POA: Diagnosis present

## 2023-11-26 LAB — CBC WITH DIFFERENTIAL (CANCER CENTER ONLY)
Abs Immature Granulocytes: 0.03 10*3/uL (ref 0.00–0.07)
Basophils Absolute: 0 10*3/uL (ref 0.0–0.1)
Basophils Relative: 0 %
Eosinophils Absolute: 0 10*3/uL (ref 0.0–0.5)
Eosinophils Relative: 0 %
HCT: 33.7 % — ABNORMAL LOW (ref 39.0–52.0)
Hemoglobin: 11.4 g/dL — ABNORMAL LOW (ref 13.0–17.0)
Immature Granulocytes: 0 %
Lymphocytes Relative: 16 %
Lymphs Abs: 1.1 10*3/uL (ref 0.7–4.0)
MCH: 27.3 pg (ref 26.0–34.0)
MCHC: 33.8 g/dL (ref 30.0–36.0)
MCV: 80.6 fL (ref 80.0–100.0)
Monocytes Absolute: 0.6 10*3/uL (ref 0.1–1.0)
Monocytes Relative: 8 %
Neutro Abs: 5.3 10*3/uL (ref 1.7–7.7)
Neutrophils Relative %: 76 %
Platelet Count: 375 10*3/uL (ref 150–400)
RBC: 4.18 MIL/uL — ABNORMAL LOW (ref 4.22–5.81)
RDW: 19.5 % — ABNORMAL HIGH (ref 11.5–15.5)
WBC Count: 7 10*3/uL (ref 4.0–10.5)
nRBC: 0.6 % — ABNORMAL HIGH (ref 0.0–0.2)

## 2023-11-26 LAB — CMP (CANCER CENTER ONLY)
ALT: 36 U/L (ref 0–44)
AST: 33 U/L (ref 15–41)
Albumin: 3.6 g/dL (ref 3.5–5.0)
Alkaline Phosphatase: 337 U/L — ABNORMAL HIGH (ref 38–126)
Anion gap: 10 (ref 5–15)
BUN: 22 mg/dL (ref 8–23)
CO2: 26 mmol/L (ref 22–32)
Calcium: 9.4 mg/dL (ref 8.9–10.3)
Chloride: 97 mmol/L — ABNORMAL LOW (ref 98–111)
Creatinine: 0.82 mg/dL (ref 0.61–1.24)
GFR, Estimated: >60 mL/min (ref >60)
Glucose, Bld: 208 mg/dL — ABNORMAL HIGH (ref 70–99)
Potassium: 4 mmol/L (ref 3.5–5.1)
Sodium: 133 mmol/L — ABNORMAL LOW (ref 135–145)
Total Bilirubin: 2.4 mg/dL — ABNORMAL HIGH (ref 0.0–1.2)
Total Protein: 6.7 g/dL (ref 6.5–8.1)

## 2023-11-26 MED ORDER — SODIUM CHLORIDE 0.9% FLUSH
10.0000 mL | INTRAVENOUS | Status: DC | PRN
  Administered 2023-11-26: 10 mL

## 2023-11-26 MED ORDER — SODIUM CHLORIDE 0.9% FLUSH
10.0000 mL | Freq: Once | INTRAVENOUS | Status: AC
  Administered 2023-11-26: 10 mL

## 2023-11-26 MED ORDER — HEPARIN SOD (PORK) LOCK FLUSH 100 UNIT/ML IV SOLN
500.0000 [IU] | Freq: Once | INTRAVENOUS | Status: AC | PRN
  Administered 2023-11-26: 500 [IU]

## 2023-11-26 MED ORDER — SODIUM CHLORIDE 0.9 % IV SOLN
600.0000 mg/m2 | Freq: Once | INTRAVENOUS | Status: AC
  Administered 2023-11-26: 1026 mg via INTRAVENOUS
  Filled 2023-11-26: qty 26.98

## 2023-11-26 MED ORDER — PROCHLORPERAZINE MALEATE 10 MG PO TABS
10.0000 mg | ORAL_TABLET | Freq: Once | ORAL | Status: AC
  Administered 2023-11-26: 10 mg via ORAL
  Filled 2023-11-26: qty 1

## 2023-11-26 MED ORDER — SODIUM CHLORIDE 0.9 % IV SOLN: INTRAVENOUS | Status: DC

## 2023-11-26 NOTE — Patient Instructions (Signed)
 [facility] - A DEPT OF [facility]  Discharge Instructions: Thank you for choosing [facility] to provide your oncology and hematology care.   If you have a lab appointment with the Cancer Center, please go directly to the Cancer Center and check in at the registration area.   Wear comfortable clothing and clothing appropriate for easy access to any Portacath or PICC line.   We strive to give you quality time with your provider. You may need to reschedule your appointment if you arrive late (15 or more minutes).  Arriving late affects you and other patients whose appointments are after yours.  Also, if you miss three or more appointments without notifying the office, you may be dismissed from the clinic at the provider's discretion.      For prescription refill requests, have your pharmacy contact our office and allow 72 hours for refills to be completed.    Today you received the following chemotherapy and/or immunotherapy agents Gemcitabine.      To help prevent nausea and vomiting after your treatment, we encourage you to take your nausea medication as directed.  BELOW ARE SYMPTOMS THAT SHOULD BE REPORTED IMMEDIATELY: *FEVER GREATER THAN 100.4 F (38 C) OR HIGHER *CHILLS OR SWEATING *NAUSEA AND VOMITING THAT IS NOT CONTROLLED WITH YOUR NAUSEA MEDICATION *UNUSUAL SHORTNESS OF BREATH *UNUSUAL BRUISING OR BLEEDING *URINARY PROBLEMS (pain or burning when urinating, or frequent urination) *BOWEL PROBLEMS (unusual diarrhea, constipation, pain near the anus) TENDERNESS IN MOUTH AND THROAT WITH OR WITHOUT PRESENCE OF ULCERS (sore throat, sores in mouth, or a toothache) UNUSUAL RASH, SWELLING OR PAIN  UNUSUAL VAGINAL DISCHARGE OR ITCHING   Items with * indicate a potential emergency and should be followed up as soon as possible or go to the Emergency Department if any problems should occur.  Please show the CHEMOTHERAPY ALERT CARD or  IMMUNOTHERAPY ALERT CARD at check-in to the Emergency Department and triage nurse.  Should you have questions after your visit or need to cancel or reschedule your appointment, please contact [facility] - A DEPT OF [facility]  Dept: [phone]  and follow the prompts.  Office hours are 8:00 a.m. to 4:30 p.m. Monday - Friday. Please note that voicemails left after 4:00 p.m. may not be returned until the following business day.  We are closed weekends and major holidays. You have access to a nurse at all times for urgent questions. Please call the main number to the clinic Dept: [phone] and follow the prompts.   For any non-urgent questions, you may also contact your provider using MyChart. We now offer e-Visits for anyone 68 and older to request care online for non-urgent symptoms. For details visit [email or web address].   Also download the MyChart app! Go to the app store, search "MyChart", open the app, select [place], and log in with your MyChart username and password.  Gemcitabine Injection What is this medication? GEMCITABINE (jem SYE ta been) treats some types of cancer. It works by slowing down the growth of cancer cells. This medicine may be used for other purposes; ask your health care provider or pharmacist if you have questions. COMMON BRAND NAME(S): Gemzar, Infugem What should I tell my care team before I take this medication? They need to know if you have any of these conditions: Blood disorders Infection Kidney disease Liver disease Lung or breathing disease, such as asthma or COPD Recent or ongoing  radiation therapy An unusual or allergic reaction to gemcitabine, other medications, foods, dyes, or preservatives If you or your partner are pregnant or trying to get pregnant Breast-feeding How should I use this medication? This medication is injected into a vein. It is given by your care team in a hospital or clinic setting. Talk  to your care team about the use of this medication in children. Special care may be needed. Overdosage: If you think you have taken too much of this medicine contact a poison control center or emergency room at once. NOTE: This medicine is only for you. Do not share this medicine with others. What if I miss a dose? Keep appointments for follow-up doses. It is important not to miss your dose. Call your care team if you are unable to keep an appointment. What may interact with this medication? Interactions have not been studied. This list may not describe all possible interactions. Give your health care provider a list of all the medicines, herbs, non-prescription drugs, or dietary supplements you use. Also tell them if you smoke, drink alcohol, or use illegal drugs. Some items may interact with your medicine. What should I watch for while using this medication? Your condition will be monitored carefully while you are receiving this medication. This medication may make you feel generally unwell. This is not uncommon, as chemotherapy can affect healthy cells as well as cancer cells. Report any side effects. Continue your course of treatment even though you feel ill unless your care team tells you to stop. In some cases, you may be given additional medications to help with side effects. Follow all directions for their use. This medication may increase your risk of getting an infection. Call your care team for advice if you get a fever, chills, sore throat, or other symptoms of a cold or flu. Do not treat yourself. Try to avoid being around people who are sick. This medication may increase your risk to bruise or bleed. Call your care team if you notice any unusual bleeding. Be careful brushing or flossing your teeth or using a toothpick because you may get an infection or bleed more easily. If you have any dental work done, tell your dentist you are receiving this medication. Avoid taking medications that  contain aspirin, acetaminophen, ibuprofen, naproxen, or ketoprofen unless instructed by your care team. These medications may hide a fever. Talk to your care team if you or your partner wish to become pregnant or think you might be pregnant. This medication can cause serious birth defects if taken during pregnancy and for 6 months after the last dose. A negative pregnancy test is required before starting this medication. A reliable form of contraception is recommended while taking this medication and for 6 months after the last dose. Talk to your care team about effective forms of contraception. Do not father a child while taking this medication and for 3 months after the last dose. Use a condom while having sex during this time period. Do not breastfeed while taking this medication and for at least 1 week after the last dose. This medication may cause infertility. Talk to your care team if you are concerned about your fertility. What side effects may I notice from receiving this medication? Side effects that you should report to your care team as soon as possible: Allergic reactions--skin rash, itching, hives, swelling of the face, lips, tongue, or throat Capillary leak syndrome--stomach or muscle pain, unusual weakness or fatigue, feeling faint or lightheaded, decrease in  the amount of urine, swelling of the ankles, hands, or feet, trouble breathing Infection--fever, chills, cough, sore throat, wounds that don't heal, pain or trouble when passing urine, general feeling of discomfort or being unwell Liver injury--right upper belly pain, loss of appetite, nausea, light-colored stool, dark yellow or brown urine, yellowing skin or eyes, unusual weakness or fatigue Low red blood cell level--unusual weakness or fatigue, dizziness, headache, trouble breathing Lung injury--shortness of breath or trouble breathing, cough, spitting up blood, chest pain, fever Stomach pain, bloody diarrhea, pale skin, unusual  weakness or fatigue, decrease in the amount of urine, which may be signs of hemolytic uremic syndrome Sudden and severe headache, confusion, change in vision, seizures, which may be signs of posterior reversible encephalopathy syndrome (PRES) Unusual bruising or bleeding Side effects that usually do not require medical attention (report to your care team if they continue or are bothersome): Diarrhea Drowsiness Hair loss Nausea Pain, redness, or swelling with sores inside the mouth or throat Vomiting This list may not describe all possible side effects. Call your doctor for medical advice about side effects. You may report side effects to FDA at 1-800-FDA-1088. Where should I keep my medication? This medication is given in a hospital or clinic. It will not be stored at home. NOTE: This sheet is a summary. It may not cover all possible information. If you have questions about this medicine, talk to your doctor, pharmacist, or health care provider.  2024 Elsevier/Gold Standard (2022-01-16 00:00:00)

## 2023-11-26 NOTE — Progress Notes (Signed)
 Per Mosetta Putt, MD, okay to treat today with Bili 2.4. 1L NS over 1 hr added.

## 2023-11-26 NOTE — Progress Notes (Addendum)
 Ok to proceed with treatment today with tbili 2.4 per Dr. Mosetta Putt. Plan to receive IVF in infusion.   Jerry Caras, PharmD PGY2 Oncology Pharmacy Resident   11/26/2023 10:02 AM

## 2023-11-27 ENCOUNTER — Ambulatory Visit
Admission: RE | Admit: 2023-11-27 | Discharge: 2023-11-27 | Disposition: A | Discharge status: Home / Self Care | Source: Ambulatory Visit | Attending: Radiology | Admitting: Radiology

## 2023-11-27 ENCOUNTER — Telehealth: Payer: Self-pay | Admitting: Hematology

## 2023-11-27 DIAGNOSIS — C259 Malignant neoplasm of pancreas, unspecified: Secondary | ICD-10-CM

## 2023-11-27 DIAGNOSIS — G894 Chronic pain syndrome: Secondary | ICD-10-CM

## 2023-11-27 NOTE — Telephone Encounter (Signed)
 Patient is aware of scheduled appointment times/dates

## 2023-11-28 ENCOUNTER — Encounter: Payer: Self-pay | Admitting: Hematology

## 2023-11-28 HISTORY — PX: IR RADIOLOGIST EVAL & MGMT: IMG5224

## 2023-11-28 LAB — CANCER ANTIGEN 19-9: CA 19-9: 12222 U/mL — ABNORMAL HIGH (ref 0–35)

## 2023-11-28 NOTE — Progress Notes (Signed)
 Chief Complaint:  Abdominal pain   Referring Physician(s): Byerly,Faera   Oncology: Dr. Mosetta Putt   History of Present Illness: Scott [name] is a 73 y.o. male presenting as scheduled follow up phone call VIR, to discuss results of celiac plexus block recently performed.    We performed telemedicine visit 11/27/23 and confirmed his identity with 2 personal identifiers. .    History: Mr Hergert's imaging diagnosis was made 04/11/23 on CT with history of pain and weight loss.  EUS pancreatic mass biopsy confirmed adenocarcinoma, and he was subsequently seen by [name].     On 08/13/2023 he underwent laparoscopy, splenectomy, distal subtotal pancreatectomy, partial gastrectomy, and partial small bowel resection.     His chemotherapy regimen with Dr. Mosetta Putt is currently a cycle every 14 days:  Irinotecan, Oxaliplatin, Leucovorin, Fluorouracil; based on notes from 2/11.     He tells me that his pain has been getting slightly worse over time, located in both the center back, and the epigastric region.  This is present every day.  He denies N/V.  Endorsed weight loss.  Has some constipation, likely related to pain medication.   Currently he is taking a base of Q12 hour morphine tablet, with break through pain trated with oxycodone.  He says he is also supplementing with fiber/senkot for constipation.    He also has dysgeusia, and tells me he does not enjoy food though has an ok appetite.    He is active during the day, but seems to be sleeping about 11 hours or so.   Interval History: We performed a CT guided celiac plexus block via anterior approach with moderate sedation 11/22/23.   This was done outpt, and he went home after 1 hr of sedation recovery.   He tells me that he feels like his back pain was significantly improved afterwards, and that he had more comfort with activities and sleeping.  He does report some ongoing anterior abdominal "stomach pain".    He had no diarrhea.  No  dizziness or pre-syncope.   Past Medical History:  Diagnosis Date   Arthritis    Bilateral Knees/Fingers   CAD (coronary artery disease)    Cancer (HCC) 2024   Pancreatic Cancer   Cerebrovascular disease    Diabetes mellitus without complication (HCC)    Dysrhythmia    Hx. A.Fib. Been NSR since ablation   History of kidney stones    HLD (hyperlipidemia)    HTN (hypertension)    IBS (irritable bowel syndrome)    Patient states did not have   Lumbar stenosis    MI 1996   Persistent atrial fibrillation (HCC)    PVD (peripheral vascular disease) (HCC)    Stroke (cerebrum) (HCC)    Tobacco abuse     Past Surgical History:  Procedure Laterality Date   ABDOMINAL AORTOGRAM W/LOWER EXTREMITY N/A 01/25/2017   Procedure: Abdominal Aortogram w/Lower Extremity;  Surgeon: Sherren Kerns, MD;  Location: [facility] INVASIVE CV LAB;  Service: Cardiovascular;  Laterality: N/A;   ANTERIOR CRUCIATE LIGAMENT REPAIR     BOWEL RESECTION N/A 08/13/2023   Procedure: PARTIAL SMALL BOWEL RESECTION;  Surgeon: Almond Lint, MD;  Location: [facility] OR;  Service: General;  Laterality: N/A;   CARDIAC CATHETERIZATION     CONE   CARDIAC CATHETERIZATION N/A 07/27/2016   Procedure: Left Heart Cath and Coronary Angiography;  Surgeon: Tonny Bollman, MD;  Location: [facility] INVASIVE CV LAB;  Service: Cardiovascular;  Laterality: N/A;   CARDIOVERSION N/A 06/29/2016  Procedure: CARDIOVERSION;  Surgeon: Pricilla Riffle, MD;  Location: [facility] ENDOSCOPY;  Service: Cardiovascular;  Laterality: N/A;   CARDIOVERSION N/A 03/05/2018   Procedure: CARDIOVERSION;  Surgeon: Laurey Morale, MD;  Location: [facility] ENDOSCOPY;  Service: Cardiovascular;  Laterality: N/A;   CHOLECYSTECTOMY     ENDARTERECTOMY Right 12/06/2017   Procedure: ENDARTERECTOMY CAROTID RIGHT;  Surgeon: Nada Libman, MD;  Location: [facility] OR;  Service: Vascular;  Laterality: Right;   ESOPHAGOGASTRODUODENOSCOPY (EGD) WITH PROPOFOL N/A 05/01/2023   Procedure:  ESOPHAGOGASTRODUODENOSCOPY (EGD) WITH PROPOFOL;  Surgeon: Willis Modena, MD;  Location: [facility] ENDOSCOPY;  Service: Gastroenterology;  Laterality: N/A;   FINE NEEDLE ASPIRATION N/A 05/01/2023   Procedure: FINE NEEDLE ASPIRATION (FNA) LINEAR;  Surgeon: Willis Modena, MD;  Location: [facility] ENDOSCOPY;  Service: Gastroenterology;  Laterality: N/A;   IR RADIOLOGIST EVAL & MGMT  11/08/2023   KNEE SURGERY Bilateral 1982, 1988, 1997, 2000   x 4 times.   LAPAROSCOPY N/A 08/13/2023   Procedure: LAPAROSCOPY DIAGNOSTIC;  Surgeon: Almond Lint, MD;  Location: [facility] OR;  Service: General;  Laterality: N/A;   LOWER EXTREMITY ANGIOGRAPHY N/A 07/03/2019   Procedure: LOWER EXTREMITY ANGIOGRAPHY;  Surgeon: Sherren Kerns, MD;  Location: [facility];  Service: Cardiovascular;  Laterality: N/A;   LUMBAR LAMINECTOMY/DECOMPRESSION MICRODISCECTOMY Bilateral 09/09/2018   Procedure: Laminectomy and Foraminotomy bilateral Lumbar three-Lumbar four - Lumbar four-Lumbar five;  Surgeon: Julio Sicks, MD;  Location: [facility] OR;  Service: Neurosurgery;  Laterality: Bilateral;   PARTIAL GASTRECTOMY N/A 08/13/2023   Procedure: PARTIAL GASTRECTOMY;  Surgeon: Almond Lint, MD;  Location: [facility] OR;  Service: General;  Laterality: N/A;   PATCH ANGIOPLASTY Right 12/06/2017   Procedure: PATCH ANGIOPLASTY RIGHT CAROTID ARTERY;  Surgeon: Nada Libman, MD;  Location: [facility] OR;  Service: Vascular;  Laterality: Right;   PERIPHERAL VASCULAR INTERVENTION Right 01/25/2017   Procedure: Peripheral Vascular Intervention;  Surgeon: Sherren Kerns, MD;  Location: [facility] INVASIVE CV LAB;  Service: Cardiovascular;  Laterality: Right;   PERIPHERAL VASCULAR INTERVENTION Right 07/03/2019   Procedure: PERIPHERAL VASCULAR INTERVENTION;  Surgeon: Sherren Kerns, MD;  Location: [facility] INV[facility] CV LAB;  Service: Cardiovascular;  Laterality: Right;   PORTACATH PLACEMENT Left 05/14/2023   Procedure: INSERTION PORT-A-CATH;  Surgeon: Almond Lint, MD;  Location: [facility]  OR;  Service: General;  Laterality: Left;   TONSILLECTOMY     UPPER ESOPHAGEAL ENDOSCOPIC ULTRASOUND (EUS) Bilateral 05/01/2023   Procedure: UPPER ESOPHAGEAL ENDOSCOPIC ULTRASOUND (EUS);  Surgeon: Willis Modena, MD;  Location: [facility] ENDOSCOPY;  Service: Gastroenterology;  Laterality: Bilateral;   XI ROBOTIC ASSISTED LAPAROSCOPIC DISTAL PANCREATECTOMY N/A 08/13/2023   Procedure: HAND ASSISTED LAPAROSCOPIC SPLENECTOMY;  Surgeon: Almond Lint, MD;  Location: [facility] OR;  Service: General;  Laterality: N/A;    Allergies: Augmentin [amoxicillin-pot clavulanate]  Medications: Prior to Admission medications   Medication Sig Start Date End Date Taking? Authorizing Provider  acetaminophen (TYLENOL) 500 MG tablet Take 2 tablets (1,000 mg total) by mouth every 6 (six) hours. 08/16/23   Almond Lint, MD  apixaban (ELIQUIS) 5 MG TABS tablet Take 1 tablet (5 mg total) by mouth 2 (two) times daily. 07/03/23   [name], [name], MD  aspirin EC 81 MG tablet Take 81 mg by mouth daily. 09/08/20   [provider]  diazepam (VALIUM) 2 MG tablet Take 2 mg by mouth 2 (two) times daily as needed for anxiety. 02/18/20   [provider]  diclofenac Sodium (VOLTAREN) 1 % GEL Apply 4 g topically 4 (four) times daily as needed (Up  to 4x daily max). 08/16/23   Almond Lint, MD  esomeprazole (NEXIUM) 40 MG capsule Take 40 mg by mouth daily as needed (heartburn/hiccups).    [provider]  fenofibrate (TRICOR) 48 MG tablet Take 48 mg by mouth daily. 01/16/22   [provider]  gabapentin (NEURONTIN) 300 MG capsule Take 900 mg by mouth 2 (two) times daily. 02/03/20   [provider]  Insulin Glargine (BASAGLAR KWIKPEN) 100 UNIT/ML Inject 8 Units into the skin daily. 08/19/23   Kinsinger, De Blanch, MD  JARDIANCE 25 MG TABS tablet Take 25 mg by mouth daily. 03/03/22   [provider]  meclizine (ANTIVERT) 25 MG tablet Take 1 tablet (25 mg total) by mouth 3 (three) times daily as  needed for dizziness. 11/10/23   Vanetta Mulders, MD  meclizine (ANTIVERT) 25 MG tablet Take 1 tablet (25 mg total) by mouth every 12 (twelve) hours as needed for vertigo for 15 days 11/19/23     megestrol (MEGACE) 40 MG/ML suspension Take 200 mg by mouth 2 (two) times daily. 08/30/23 08/29/24  [provider]  megestrol (MEGACE) 40 MG/ML suspension Take 5 mLs (200 mg total) by mouth 2 (two) times daily. 08/30/23     metFORMIN (GLUCOPHAGE-XR) 500 MG 24 hr tablet Take 1,000 mg by mouth daily after supper. 01/10/22   [provider]  methocarbamol (ROBAXIN) 750 MG tablet Take 1 tablet (750 mg total) by mouth every 8 (eight) hours as needed for muscle spasms. 08/16/23   Almond Lint, MD  morphine (MS CONTIN) 15 MG 12 hr tablet Take 1 tablet (15 mg total) by mouth every 12 (twelve) hours. 11/01/23   Pickenpack-Cousar, Arty Baumgartner, NP  ondansetron (ZOFRAN) 4 MG tablet Take 4 mg by mouth every 8 (eight) hours as needed for nausea or vomiting.    [provider]  oxyCODONE (OXY IR/ROXICODONE) 5 MG immediate release tablet Take 1-2 tablets (5-10 mg total) by mouth every 4 (four) hours as needed for moderate pain (pain score 4-6) or breakthrough pain. 11/25/23   Pickenpack-Cousar, Arty Baumgartner, NP  polyethylene glycol (MIRALAX / GLYCOLAX) 17 g packet Take 17 g by mouth daily as needed for moderate constipation.    [provider]  prochlorperazine (COMPAZINE) 10 MG tablet Take 1 tablet (10 mg total) by mouth every 6 (six) hours as needed for nausea or vomiting (Use for nausea and / or vomiting unresolved with ondansetron (Zofran).). 08/16/23   Almond Lint, MD  rosuvastatin (CRESTOR) 40 MG tablet Take 40 mg by mouth daily.    [provider]  sildenafil (VIAGRA) 100 MG tablet Take 100 mg by mouth as needed for erectile dysfunction. 04/14/18   [provider]  spironolactone (ALDACTONE) 25 MG tablet Take 25 mg by mouth daily.    [provider]     Family History   Problem Relation Age of Onset   Heart disease Father    Melanoma Father    Testicular cancer Father    Hypertension Mother    Arthritis Mother    Arthritis Sister    Lung disease Neg Hx     Social History   Socioeconomic History   Marital status: Married    Spouse name: Lurena Joiner   Number of children: 2   Years of education: Not on file   Highest education level: Not on file  Occupational History   Occupation: retired  Tobacco Use   Smoking status: Every Day    Current packs/day: 0.00    Average  packs/day: 1 pack/day for 50.0 years (50.0 ttl pk-yrs)    Types: Cigarettes    Start date: 06/02/1971    Last attempt to quit: 12/09/2017    Years since quitting: 5.9   Smokeless tobacco: Never   Tobacco comments:    Pt smokes one pack cigarettes per day  Vaping Use   Vaping status: Some Days   Substances: Nicotine  Substance and Sexual Activity   Alcohol use: Yes    Comment: Rarely   Drug use: No   Sexual activity: Not Currently  Other Topics Concern   Not on file  Social History Narrative   He is originally from [place]. He moved to [facility] in [place]. Previously has traveled to most of the [place]. Has traveled to [place] & [place]. Has been to [place], [place], & [place] 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 Has a dog currently. Remote bird exposure in 1980. No mold exposure.    Social Drivers of Corporate investment banker Strain: Not on file  Food Insecurity: No Food Insecurity (08/13/2023)   Hunger Vital Sign    Worried About Running Out of Food in the Last Year: Never true    Ran Out of Food in the Last Year: Never true  Transportation Needs: No Transportation Needs (08/13/2023)   PRAPARE - Administrator, Civil Service (Medical): No    Lack of Transportation (Non-Medical): No  Physical Activity: Not on file  Stress: Not on file  Social Connections: Not on file    ECOG Status: 2 - Symptomatic, <50% confined to bed  Review of Systems: A 12 point  ROS discussed and pertinent positives are indicated in the HPI above.  All other systems are negative.  Review of Systems  Vital Signs: There were no vitals taken for this visit.      Physical Exam  Mallampati Score:     Imaging: CT CELIAC PLEXUS BLOCK ANESTHETIC Result Date: 11/22/2023 INDICATION: 73 year old male with pain related to pancreatic carcinoma. EXAM: CT GUIDED CELIAC PLEXUS NERVE BLOCK MEDICATIONS: None. ANESTHESIA/SEDATION: Moderate (conscious) sedation was employed during this procedure. A total of Versed 1.5 mg and Fentanyl 50 mcg was administered intravenously by the radiology nurse. Total intra-service moderate Sedation Time: 27 minutes. [name] level of consciousness and vital signs were monitored continuously by radiology nursing throughout the procedure under my direct supervision. COMPLICATIONS: None PROCEDURE: Informed written consent was obtained from the patient after a thorough discussion of the procedural risks, benefits and alternatives. All questions were addressed. Maximal Sterile Barrier Technique was utilized including caps, mask, sterile gowns, sterile gloves, sterile drape, hand hygiene and skin antiseptic. A timeout was performed prior to the initiation of the procedure. Patient was positioned supine on the CT gantry table. Scout CT acquired for planning purposes. The patient is prepped and draped in the usual sterile fashion. 1% lidocaine was used for local anesthesia. Using CT guidance, a single 21 gauge 15 cm Chiba needle was advanced from an anterior epigastric approach to the anterior aspect of the aorta, just above the celiac artery. Once the needle was in position small amount of gas was injected. Repeat CT was performed confirming location of the needle and distribution of fluid spread. Single adjustment of the needle tip was performed with repeat CT. We then infused 30 cc volume of 0.5% ropivacaine, as well as 80 mg methylprednisolone in 1 cc.  Approximately 2 cc of Isovue contrast was infused with the final administration of ropivacaine. Needle was removed  Final CT was acquired. Sterile dressing was applied. Patient tolerated the procedure well and remained hemodynamically stable throughout. No complications were encountered and no significant blood loss. IMPRESSION: Status post CT-guided celiac nerve block with steroid and anesthetic. Signed, Yvone Neu. Miachel Roux, RPVI [facility] Radiology Electronically Signed   By: Gilmer Mor D.O.   On: 11/22/2023 12:49   MR Brain Wo Contrast (neuro protocol) Result Date: 11/10/2023 CLINICAL DATA:  Provided history: Neuro deficit, acute, stroke suspected. Metastatic disease evaluation. EXAM: MRI HEAD WITHOUT CONTRAST TECHNIQUE: Multiplanar, multiecho pulse sequences of the brain and surrounding structures were obtained without intravenous contrast. COMPARISON:  Non-contrast head CT 11/10/2023.  Brain MRI 12/04/2017. FINDINGS: Please note, assessment for intracranial metastatic disease is limited on this non-contrast brain MRI. Intermittent motion degradation also limits evaluation. Most notably, the axial FLAIR sequence is severely motion degraded and the coronal T2 sequence is moderate-to-severely motion degraded. Within these limitations, findings are as follows. Brain: Generalized cerebral atrophy. 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 Multifocal T2 FLAIR hyperintense signal abnormality within the cerebral white matter, nonspecific but compatible with mild-to-moderate chronic small vessel ischemic disease. Mild chronic small vessel ischemic changes within the pons. There is no acute infarct. No evidence of an intracranial mass. No chronic intracranial blood products. No extra-axial fluid collection. No midline shift. Vascular:  Maintained flow voids within the proximal large arterial vessels. Skull and upper cervical spine: No focal worrisome marrow lesion. Sinuses/Orbits: No mass or acute finding within the imaged orbits. No significant paranasal sinus disease. IMPRESSION: 1. Please note, assessment for intracranial metastatic disease is limited on this non-contrast brain MRI. The examination is also intermittently motion degraded, as described. Within these limitations, findings are as follows. 2. No evidence of intracranial metastatic disease. 3.  No evidence of an acute intracranial abnormality. 4. 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 5. Chronic small vessel ischemic changes within the cerebral white matter (mild-to-moderate) and pons (mild). 6. Generalized cerebral atrophy. Electronically Signed   By: Jackey Loge D.O.   On: 11/10/2023 18:45   CT Head Wo Contrast Result Date: 11/10/2023 CLINICAL DATA:  Evaluate for neuro deficit. New onset vertigo. History of pancreas cancer. EXAM: CT HEAD WITHOUT CONTRAST TECHNIQUE: Contiguous axial images were obtained from the base of the skull through the vertex without intravenous contrast. RADIATION DOSE REDUCTION: This exam was performed according to the departmental dose-optimization program which includes automated exposure control, adjustment of the mA and/or kV according to patient size and/or use of iterative reconstruction technique. COMPARISON:  None Available. FINDINGS: Brain: No evidence of acute infarction, hemorrhage, hydrocephalus, extra-axial collection or mass lesion/mass effect. Multifocal patchy areas of low attenuation within the periventricular and subcortical white matter. Prominence of the sulci and ventricles compatible with mild age related brain atrophy. Vascular: No hyperdense vessel or unexpected calcification. Skull: Normal. Negative for fracture or focal lesion. Sinuses/Orbits: No acute finding. Other: None.  IMPRESSION: 1. No acute intracranial abnormalities. 2. 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 Electronically Signed   By: Signa Kell M.D.   On: 11/10/2023 16:01   IR Radiologist Eval & Mgmt Result Date: 11/08/2023 EXAM: NEW PATIENT OFFICE VISIT CHIEF COMPLAINT: Electronic medical record HISTORY OF PRESENT ILLNESS: Electronic medical record REVIEW OF SYSTEMS: Electronic medical record PHYSICAL EXAMINATION: Electronic medical record ASSESSMENT AND  PLAN: Electronic medical record Electronically Signed   By: Gilmer Mor D.O.   On: 11/08/2023 14:13   NM PET Image Restage (PS) Skull Base to Thigh (F-18 FDG) Result Date: 10/29/2023 CLINICAL DATA:  Subsequent treatment strategy for pancreatic cancer. EXAM: NUCLEAR MEDICINE PET SKULL BASE TO THIGH TECHNIQUE: 6.25 mCi F-18 FDG was injected intravenously. Full-ring PET imaging was performed from the skull base to thigh after the radiotracer. CT data was obtained and used for attenuation correction and anatomic localization. Fasting blood glucose: 121 mg/dl COMPARISON:  Prior PET-CT 05/20/2023 and most recent CT scan 10/15/2023 FINDINGS: Mediastinal blood pool activity: SUV max 1.70 Liver activity: SUV max NA NECK: Persistent bilateral hypermetabolic parotid or periparotid lesions. The right-sided parotid gland lesion has an SUV max of 3.25 and is unchanged. The left parotid or periparotid nodule has an SUV max of 6.61 and was previously 5.66. Persistent hypermetabolic left thyroid nodule. This measures approximately 14 mm and SUV max is 6.11. This was previously 6.3. This has been evaluated on previous imaging. (ref: J Am Coll Radiol. 2015 Feb;12(2): 143-50). Incidental CT findings: Advanced bilateral carotid artery calcifications. CHEST: No hypermetabolic mediastinal or hilar nodes. No suspicious pulmonary nodules on the CT scan. Incidental CT findings: Stable advanced atherosclerotic calcifications involving the aorta, branch vessels  and coronary arteries. ABDOMEN/PELVIS: The abnormal soft tissue lesion in the region of the pancreatic bed described on the prior abdominal CT scan is hypermetabolic with SUV max of 9.03. This area measures approximately 4.0 x 4.0 cm and is highly worrisome for residual or recurrent tumor. No findings to suggest metastatic disease. No hypermetabolic hepatic or adrenal gland lesions. There are several small mesenteric lymph nodes but no definite hypermetabolism. Incidental CT findings: Improved/largely resolved fluid collections in the lesser sac area likely resolving postoperative fluid collections. Stable advanced vascular disease. Persistent biliary dilatation. SKELETON: No findings suspicious for osseous metastatic disease. Incidental CT findings: None. IMPRESSION: 1. The abnormal soft tissue lesion in the region of the pancreatic bed described on the prior abdominal CT scan is hypermetabolic and highly worrisome for residual or recurrent tumor. 2. No findings for metastatic disease. 3. Persistent bilateral hypermetabolic parotid or periparotid lesions. 4. Persistent hypermetabolic left thyroid nodule. 5. Improved/largely resolved fluid collections in the lesser sac area likely resolving postoperative fluid collections. 6. Stable advanced vascular disease. 7. Aortic atherosclerosis. Electronically Signed   By: Rudie Meyer M.D.   On: 10/29/2023 17:33    Labs:  CBC: Recent Labs    11/10/23 1327 11/19/23 0927 11/22/23 0810 11/26/23 0905  WBC 10.1 8.4 9.9 7.0  HGB 13.1 13.4 12.9* 11.4*  HCT 40.1 40.5 39.5 33.7*  PLT 523* 485* 453* 375    COAGS: Recent Labs    11/22/23 0810  INR 1.2    BMP: Recent Labs    11/10/23 1327 11/19/23 0927 11/22/23 0810 11/26/23 0905  NA 135 137 133* 133*  K 3.9 4.2 4.1 4.0  CL 104 103 98 97*  CO2 22 25 25 26   GLUCOSE 195* 157* 122* 208*  BUN 17 15 19 22   CALCIUM 8.7* 9.3 9.2 9.4  CREATININE 0.77 0.95 0.93 0.82  GFRNONAA >60 >60 >60 >60    LIVER  FUNCTION TESTS: Recent Labs    11/10/23 1354 11/19/23 0927 11/22/23 0810 11/26/23 0905  BILITOT 0.6 0.7 1.2 2.4*  AST 20 68* 64* 33  ALT 27 42 56* 36  ALKPHOS 110 236* 269* 337*  PROT 6.1* 6.8 7.1 6.7  ALBUMIN 3.2* 3.7 3.6  3.6    TUMOR MARKERS: No results for input(s): "AFPTM", "CEA", "CA199", "CHROMGRNA" in the last 8760 hours.  Assessment and Plan:  Mr Luellen is a very pleasant 73 year old male with significant abdominal and back pain related to pancreas carcinoma.    He had a recent celiac plexus block with ropivacaine and dexamethasone, which he reports was very effective relieving his back pain.   He would like to proceed with image guided formal ETOH nerve ablation.   We will plan to proceed.   Electronically Signed: Gilmer Mor 11/28/2023, 3:27 PM   I spent a total of    15 Minutes in face to face in clinical consultation, greater than 50% of which was counseling/coordinating care for celiac plexus nerve block, possible ETOH ablation.

## 2023-11-29 ENCOUNTER — Encounter: Payer: Self-pay | Admitting: Nurse Practitioner

## 2023-11-29 ENCOUNTER — Encounter: Payer: Self-pay | Admitting: Hematology

## 2023-11-29 NOTE — Progress Notes (Signed)
 erroneous

## 2023-12-02 ENCOUNTER — Inpatient Hospital Stay (HOSPITAL_BASED_OUTPATIENT_CLINIC_OR_DEPARTMENT_OTHER): Admitting: Nurse Practitioner

## 2023-12-02 ENCOUNTER — Encounter: Payer: Self-pay | Admitting: Nurse Practitioner

## 2023-12-02 ENCOUNTER — Telehealth: Payer: Self-pay

## 2023-12-02 ENCOUNTER — Other Ambulatory Visit: Payer: Self-pay | Admitting: Nurse Practitioner

## 2023-12-02 ENCOUNTER — Other Ambulatory Visit: Payer: Self-pay

## 2023-12-02 ENCOUNTER — Telehealth (HOSPITAL_COMMUNITY): Payer: Self-pay

## 2023-12-02 ENCOUNTER — Inpatient Hospital Stay

## 2023-12-02 ENCOUNTER — Other Ambulatory Visit (HOSPITAL_COMMUNITY): Payer: Self-pay

## 2023-12-02 DIAGNOSIS — C252 Malignant neoplasm of tail of pancreas: Secondary | ICD-10-CM

## 2023-12-02 DIAGNOSIS — G893 Neoplasm related pain (acute) (chronic): Secondary | ICD-10-CM

## 2023-12-02 DIAGNOSIS — C251 Malignant neoplasm of body of pancreas: Secondary | ICD-10-CM

## 2023-12-02 DIAGNOSIS — R634 Abnormal weight loss: Secondary | ICD-10-CM

## 2023-12-02 DIAGNOSIS — Z7189 Other specified counseling: Secondary | ICD-10-CM

## 2023-12-02 DIAGNOSIS — Z515 Encounter for palliative care: Secondary | ICD-10-CM | POA: Diagnosis not present

## 2023-12-02 DIAGNOSIS — R530 Neoplastic (malignant) related fatigue: Secondary | ICD-10-CM | POA: Diagnosis not present

## 2023-12-02 DIAGNOSIS — R630 Anorexia: Secondary | ICD-10-CM

## 2023-12-02 DIAGNOSIS — Z5111 Encounter for antineoplastic chemotherapy: Secondary | ICD-10-CM | POA: Diagnosis not present

## 2023-12-02 LAB — CBC WITH DIFFERENTIAL (CANCER CENTER ONLY)
Abs Immature Granulocytes: 0.01 10*3/uL (ref 0.00–0.07)
Basophils Absolute: 0 10*3/uL (ref 0.0–0.1)
Basophils Relative: 0 %
Eosinophils Absolute: 0 10*3/uL (ref 0.0–0.5)
Eosinophils Relative: 1 %
HCT: 32.7 % — ABNORMAL LOW (ref 39.0–52.0)
Hemoglobin: 11 g/dL — ABNORMAL LOW (ref 13.0–17.0)
Immature Granulocytes: 0 %
Lymphocytes Relative: 44 %
Lymphs Abs: 1.4 10*3/uL (ref 0.7–4.0)
MCH: 27.3 pg (ref 26.0–34.0)
MCHC: 33.6 g/dL (ref 30.0–36.0)
MCV: 81.1 fL (ref 80.0–100.0)
Monocytes Absolute: 0 10*3/uL — ABNORMAL LOW (ref 0.1–1.0)
Monocytes Relative: 1 %
Neutro Abs: 1.7 10*3/uL (ref 1.7–7.7)
Neutrophils Relative %: 54 %
Platelet Count: 296 10*3/uL (ref 150–400)
RBC: 4.03 MIL/uL — ABNORMAL LOW (ref 4.22–5.81)
RDW: 18.6 % — ABNORMAL HIGH (ref 11.5–15.5)
WBC Count: 3.1 10*3/uL — ABNORMAL LOW (ref 4.0–10.5)
nRBC: 0.6 % — ABNORMAL HIGH (ref 0.0–0.2)

## 2023-12-02 LAB — CMP (CANCER CENTER ONLY)
ALT: 41 U/L (ref 0–44)
AST: 53 U/L — ABNORMAL HIGH (ref 15–41)
Albumin: 3.4 g/dL — ABNORMAL LOW (ref 3.5–5.0)
Alkaline Phosphatase: 278 U/L — ABNORMAL HIGH (ref 38–126)
Anion gap: 14 (ref 5–15)
BUN: 23 mg/dL (ref 8–23)
CO2: 23 mmol/L (ref 22–32)
Calcium: 8.7 mg/dL — ABNORMAL LOW (ref 8.9–10.3)
Chloride: 96 mmol/L — ABNORMAL LOW (ref 98–111)
Creatinine: 0.77 mg/dL (ref 0.61–1.24)
GFR, Estimated: >60 mL/min (ref >60)
Glucose, Bld: 180 mg/dL — ABNORMAL HIGH (ref 70–99)
Potassium: 4.2 mmol/L (ref 3.5–5.1)
Sodium: 133 mmol/L — ABNORMAL LOW (ref 135–145)
Total Bilirubin: 1.3 mg/dL — ABNORMAL HIGH (ref 0.0–1.2)
Total Protein: 6.3 g/dL — ABNORMAL LOW (ref 6.5–8.1)

## 2023-12-02 MED ORDER — SODIUM CHLORIDE 0.9 % IV SOLN: INTRAVENOUS | Status: AC

## 2023-12-02 MED ORDER — OXYCODONE HCL ER 10 MG PO T12A
10.0000 mg | EXTENDED_RELEASE_TABLET | Freq: Two times a day (BID) | ORAL | 0 refills | Status: DC
Start: 2023-12-02 — End: 2024-01-07
  Filled 2023-12-02: qty 14, 7d supply, fill #0

## 2023-12-02 NOTE — Telephone Encounter (Signed)
 Pt wife called requesting a refill of MS Contin, which is on backorder at all local pharmacies the pt prefers, medication changed to OxyContin, pt wife educated on medication change, verbalized understanding. Mrs.Beltran also concerned about dehydration as pt experienced an orthostatic event this weekend, IVF appt made for today, orders placed under signed and held. Mrs. Franek also interested in home health or a home sitting service, connected with social work for [employer]. Mrs.Drees verbalized understanding of plan, no further needs at this time.

## 2023-12-02 NOTE — Telephone Encounter (Signed)
 Received message from Glee Arvin, NP:  Raelyn Number, I need to cancel this guy's celiac block for this week. I am sending him home with hospice after his appointment here at the cancer center today. Thank you   I have canceled and sent Dr. Loreta Ave a message to make him aware. AB

## 2023-12-02 NOTE — Progress Notes (Signed)
 Palliative Medicine [facility]  [phone] [phone]   Name: Scott [name] Date: 12/02/2023 MRN: [id number]  DOB: [birth year]  Patient Care Team: [name], MD as PCP - General [name], [name], MD as PCP - Cardiology (Cardiology) [name], MD as Consulting Physician (Hematology) Pickenpack-Cousar, Arty Baumgartner, NP as Nurse Practitioner [facility] and Palliative Medicine)    INTERVAL HISTORY: Scott [name] is a 73 y.o. male with with oncologic medical history including pancreatic cancer (04/2023) as well as a-fib, HTN, HLD, arthritis, type 2 DM, and arthritis.  Palliative ask to see for symptom management and goals of care.   SOCIAL HISTORY:     reports that he has been smoking cigarettes. He started smoking about 52 years ago. He has a 50 pack-year smoking history. He has never used smokeless tobacco. He reports current alcohol use. He reports that he does not use drugs.  ADVANCE DIRECTIVES:  None on file  CODE STATUS: Full code  PAST MEDICAL HISTORY: Past Medical History:  Diagnosis Date   Arthritis    Bilateral Knees/Fingers   CAD (coronary artery disease)    Cancer (HCC) 2024   Pancreatic Cancer   Cerebrovascular disease    Diabetes mellitus without complication (HCC)    Dysrhythmia    Hx. A.Fib. Been NSR since ablation   History of kidney stones    HLD (hyperlipidemia)    HTN (hypertension)    IBS (irritable bowel syndrome)    Patient states did not have   Lumbar stenosis    MI 1996   Persistent atrial fibrillation (HCC)    PVD (peripheral vascular disease) (HCC)    Stroke (cerebrum) (HCC)    Tobacco abuse     ALLERGIES:  is allergic to augmentin [amoxicillin-pot clavulanate].  MEDICATIONS:  Current Outpatient Medications  Medication Sig Dispense Refill   acetaminophen (TYLENOL) 500 MG tablet Take 2 tablets (1,000 mg total) by mouth every 6 (six) hours. 30 tablet 0   apixaban (ELIQUIS) 5 MG TABS tablet  Take 1 tablet (5 mg total) by mouth 2 (two) times daily. 60 tablet 5   aspirin EC 81 MG tablet Take 81 mg by mouth daily.     diazepam (VALIUM) 2 MG tablet Take 2 mg by mouth 2 (two) times daily as needed for anxiety.     diclofenac Sodium (VOLTAREN) 1 % GEL Apply 4 g topically 4 (four) times daily as needed (Up to 4x daily max).     esomeprazole (NEXIUM) 40 MG capsule Take 40 mg by mouth daily as needed (heartburn/hiccups).     fenofibrate (TRICOR) 48 MG tablet Take 48 mg by mouth daily.     gabapentin (NEURONTIN) 300 MG capsule Take 900 mg by mouth 2 (two) times daily.     Insulin Glargine (BASAGLAR KWIKPEN) 100 UNIT/ML Inject 8 Units into the skin daily. 15 mL 1   JARDIANCE 25 MG TABS tablet Take 25 mg by mouth daily.     meclizine (ANTIVERT) 25 MG tablet Take 1 tablet (25 mg total) by mouth 3 (three) times daily as needed for dizziness. 30 tablet 0   meclizine (ANTIVERT) 25 MG tablet Take 1 tablet (25 mg total) by mouth every 12 (twelve) hours as needed for vertigo for 15 days 30 tablet 3   megestrol (MEGACE) 40 MG/ML suspension Take 200 mg by mouth 2 (two) times daily.     megestrol (MEGACE) 40 MG/ML suspension Take 5 mLs (200 mg total) by mouth 2 (  two) times daily. 240 mL 12   metFORMIN (GLUCOPHAGE-XR) 500 MG 24 hr tablet Take 1,000 mg by mouth daily after supper.     methocarbamol (ROBAXIN) 750 MG tablet Take 1 tablet (750 mg total) by mouth every 8 (eight) hours as needed for muscle spasms. 30 tablet 2   ondansetron (ZOFRAN) 4 MG tablet Take 4 mg by mouth every 8 (eight) hours as needed for nausea or vomiting.     oxyCODONE (OXY IR/ROXICODONE) 5 MG immediate release tablet Take 1-2 tablets (5-10 mg total) by mouth every 4 (four) hours as needed for moderate pain (pain score 4-6) or breakthrough pain. 120 tablet 0   oxyCODONE (OXYCONTIN) 10 mg 12 hr tablet Take 1 tablet (10 mg total) by mouth every 12 (twelve) hours. 14 tablet 0   polyethylene glycol (MIRALAX / GLYCOLAX) 17 g packet Take 17  g by mouth daily as needed for moderate constipation.     prochlorperazine (COMPAZINE) 10 MG tablet Take 1 tablet (10 mg total) by mouth every 6 (six) hours as needed for nausea or vomiting (Use for nausea and / or vomiting unresolved with ondansetron (Zofran).). 30 tablet 0   rosuvastatin (CRESTOR) 40 MG tablet Take 40 mg by mouth daily.     sildenafil (VIAGRA) 100 MG tablet Take 100 mg by mouth as needed for erectile dysfunction.     spironolactone (ALDACTONE) 25 MG tablet Take 25 mg by mouth daily.     No current facility-administered medications for this visit.   Facility-Administered Medications Ordered in Other Visits  Medication Dose Route Frequency Provider Last Rate Last Admin   0.9 %  sodium chloride infusion   Intravenous Continuous Pickenpack-Cousar, Arty Baumgartner, NP 999 mL/hr at 12/02/23 1410 New Bag at 12/02/23 1410    VITAL SIGNS: There were no vitals taken for this visit. There were no vitals filed for this visit.  Estimated body mass index is 16.46 kg/m as calculated from the following:   Height as of 11/26/23: 5\' 10"  (1.778 m).   Weight as of 11/26/23: 114 lb 11.2 oz (52 kg).   PERFORMANCE STATUS (ECOG) : 1 - Symptomatic but completely ambulatory   Physical Exam General: Fatigue, cachectic  Cardiovascular: regular rate and rhythm Pulmonary: normal breathing pattern Skin: no rashes Neurological: AAO X3  Discussed the use of AI scribe software for clinical note transcription with the patient, who gave verbal consent to proceed.   IMPRESSION:  [name] contacted office today expressing concerns with patient's ongoing weakness, fatigue, and dehydration. Patient brought in today for IVF support and goals of care discussions given ongoing decline in quality of life. I saw Mr. Gearheart and his wife during his infusion. Patient resting comfortably expressing coldness. He is wrapped in warm blankets. Appetite continues to be minimal. Current weight on 3/4 down to 114lbs from  155lbs last fall, 125lbs on 2/3. Wife reports he has no appetite. Currently sleeping 18-22 hours per day. She and daughter make attempts daily to awaken patient to encourage him to eat or drink.   He suffered an orthostatic event on Saturday, characterized by symptoms such as 'blank eyes, drooling, and inability to lift arms,' which led to a call to 911. Additionally, on Friday night, he experienced a sudden loss of leg strength while attempting to go to the bathroom, requiring assistance from his daughter and wife. EMS administered IVF in the home.   Due to his current condition, he is primarily bedridden and uses a urinal. A wheelchair was obtained to  assist with mobility, as he is unable to move around independently. Patient prefers to be in the bed as this is most comfortable to him. Recent Celiac Plexus block provided some relief in his pain however he continues to require use of both Oxycontin 10mg  and Oxy IR 5-10mg  as needed for breakthrough. Reports current regimen is effective.   Goals of Care We discussed Her current illness and what it means in the larger context of Her on-going co-morbidities. Natural disease trajectory and expectations were discussed. Mrs. Provencio and her husband are realistic in their understanding. She expresses they have been engaging in discussions with their daughter due to his ongoing decline and intolerance of treatment. Mr. Dahlstrom and family have no desire for him to suffer and wishes for him to be comfortable. [name] states he does not want to pursue additional celiac plexus block or other upcoming appointments expressing the difficulty and discomfort he endures trying to transport. Support provided. Mrs. Granville expresses her mutual agreement in her husband's wishes.   We discussed his poor prognosis with an understanding of appropriateness of hospice at this time. Extensive education provided on hospice's goals, philosophy of care, what care would look like in the  home, and referral process. I empathetically approached health care limitations reviewing his advanced directives. They desire for a natural death with no life-sustaining interventions confirming DNR/DNI.   Dr. Mosetta Putt has been updated, and all appointments and treatments will be discontinued. Wife confirms their wishes for referral to [facility] for hospice support.   All questions answered and support provided.  I discussed the importance of continued conversation with family and their medical providers regarding overall plan of care and treatment options, ensuring decisions are within the context of the patients values and GOCs.  Assessment and Plan  End-stage cancer Increased fatigue and sleep duration, orthostatic events, and decreased mobility. Tumor markers increasing. Patient comfortable in bed. Sleeping more hours than awake.   Cancer Related Pain Pain is well controlled on current regimen -Oxycontin 10mg  every 12 hours -Oxycodone 5-10mg  every 4 hours as needed for severe and breakthrough pain.   Goals of Care Procedure scheduled for Thursday cancelled due to patient's condition and preference. Due to ongoing decline in health and quality of life no further treatments. Patient and family clear in expressed wishes to focus on his comfort in the home. Education provided on the appropriateness of hospice referral. Dr. Mosetta Putt also discussed with patient confirming agreement with plans.  -Initiate hospice care for symptom management and support. Family confirming request for [facility].  -Discontinue any non-essential treatments or procedures. -Discontinue Celiac Plexus block for later this week.  -Provide necessary medical equipment through hospice services. -DNR/DNI form completed and given to wife.   Caregiver support Caregiver expressing need for assistance and discussing prognosis. -Ensure hospice team provides resources and support for caregiver. -Encourage open communication  about changes in patient's condition.  Follow-up No further appointments scheduled due to initiation of hospice care. Hospice team will manage patient's care and communicate with primary team as needed.  Patient expressed understanding and was in agreement with this plan. He also understands that He can call the clinic at any time with any questions, concerns, or complaints.   Any controlled substances utilized were prescribed in the context of palliative care. PDMP has been reviewed.   Visit consisted of counseling and education dealing with the complex and emotionally intense issues of symptom management and palliative care in the setting of serious and potentially life-threatening illness.  Lowella Bandy  Pickenpack-Cousar, AGPCNP-BC  Palliative Medicine Team/[facility]

## 2023-12-03 ENCOUNTER — Inpatient Hospital Stay: Admitting: Licensed Clinical Social Worker

## 2023-12-03 ENCOUNTER — Other Ambulatory Visit: Payer: Medicare Other

## 2023-12-03 ENCOUNTER — Ambulatory Visit: Payer: Medicare Other | Admitting: Nurse Practitioner

## 2023-12-03 ENCOUNTER — Ambulatory Visit: Payer: Medicare Other

## 2023-12-03 DIAGNOSIS — C252 Malignant neoplasm of tail of pancreas: Secondary | ICD-10-CM

## 2023-12-03 NOTE — Progress Notes (Signed)
 [facility] CSW Progress Note  Clinical Child psychotherapist  contacted pt's wife at the request of the palliative team.  Pt's wife states that they have made the decision to transition to hospice services which will be provided by [facility].  Pt's wife also has an appointment with Demetrios Loll at the recommendation of a family friend for additional support in the home.  Contact details for CSW provided to pt's wife should she need any additional support.        Rachel Moulds, LCSW Clinical Social Worker [facility]

## 2023-12-05 ENCOUNTER — Ambulatory Visit (HOSPITAL_COMMUNITY): Payer: Medicare Other

## 2023-12-05 ENCOUNTER — Encounter (HOSPITAL_COMMUNITY): Payer: Self-pay

## 2023-12-10 ENCOUNTER — Ambulatory Visit: Payer: Medicare Other | Admitting: Hematology

## 2023-12-10 ENCOUNTER — Ambulatory Visit: Payer: Medicare Other

## 2023-12-10 ENCOUNTER — Other Ambulatory Visit: Payer: Medicare Other

## 2023-12-10 ENCOUNTER — Encounter

## 2023-12-17 ENCOUNTER — Other Ambulatory Visit: Payer: Medicare Other

## 2023-12-17 ENCOUNTER — Ambulatory Visit: Payer: Medicare Other | Admitting: Hematology

## 2023-12-17 ENCOUNTER — Ambulatory Visit: Payer: Medicare Other

## 2023-12-20 ENCOUNTER — Ambulatory Visit (HOSPITAL_COMMUNITY): Payer: Medicare Other

## 2023-12-25 ENCOUNTER — Other Ambulatory Visit: Payer: Self-pay | Admitting: Cardiovascular Disease

## 2023-12-25 DIAGNOSIS — I4891 Unspecified atrial fibrillation: Secondary | ICD-10-CM

## 2023-12-25 NOTE — Telephone Encounter (Signed)
 Eliquis 5mg  refill request received. Patient is 73 years old, weight-52kg, Crea-0.77 on 12/02/23, Diagnosis-Afib, and last seen by Carlos Levering on 07/26/23 via Telephone Visit. Dose is appropriate based on dosing criteria. Will send in refill to requested pharmacy.

## 2024-01-23 DEATH — deceased

## 2024-02-28 ENCOUNTER — Encounter (HOSPITAL_COMMUNITY): Payer: Medicare Other

## 2024-02-28 ENCOUNTER — Ambulatory Visit: Payer: Medicare Other
